# Patient Record
Sex: Female | Born: 1955 | Race: White | Hispanic: No | Smoking: Current every day smoker
Health system: Southern US, Community
[De-identification: ages and names within clinical notes are randomized; demographics above are authoritative.]

## PROBLEM LIST (undated history)

## (undated) DIAGNOSIS — F32A Depression, unspecified: Secondary | ICD-10-CM

## (undated) DIAGNOSIS — I2510 Atherosclerotic heart disease of native coronary artery without angina pectoris: Secondary | ICD-10-CM

## (undated) DIAGNOSIS — F1910 Other psychoactive substance abuse, uncomplicated: Secondary | ICD-10-CM

## (undated) DIAGNOSIS — J849 Interstitial pulmonary disease, unspecified: Secondary | ICD-10-CM

## (undated) DIAGNOSIS — J449 Chronic obstructive pulmonary disease, unspecified: Secondary | ICD-10-CM

## (undated) DIAGNOSIS — I10 Essential (primary) hypertension: Secondary | ICD-10-CM

## (undated) DIAGNOSIS — N183 Chronic kidney disease, stage 3 unspecified: Secondary | ICD-10-CM

## (undated) DIAGNOSIS — E119 Type 2 diabetes mellitus without complications: Secondary | ICD-10-CM

## (undated) DIAGNOSIS — Z720 Tobacco use: Secondary | ICD-10-CM

## (undated) DIAGNOSIS — I739 Peripheral vascular disease, unspecified: Secondary | ICD-10-CM

## (undated) DIAGNOSIS — D649 Anemia, unspecified: Secondary | ICD-10-CM

## (undated) DIAGNOSIS — Z5189 Encounter for other specified aftercare: Secondary | ICD-10-CM

## (undated) DIAGNOSIS — J439 Emphysema, unspecified: Secondary | ICD-10-CM

## (undated) DIAGNOSIS — E039 Hypothyroidism, unspecified: Secondary | ICD-10-CM

## (undated) DIAGNOSIS — I509 Heart failure, unspecified: Secondary | ICD-10-CM

## (undated) DIAGNOSIS — E785 Hyperlipidemia, unspecified: Secondary | ICD-10-CM

## (undated) DIAGNOSIS — T7840XA Allergy, unspecified, initial encounter: Secondary | ICD-10-CM

## (undated) DIAGNOSIS — F419 Anxiety disorder, unspecified: Secondary | ICD-10-CM

## (undated) DIAGNOSIS — L409 Psoriasis, unspecified: Secondary | ICD-10-CM

## (undated) DIAGNOSIS — I5032 Chronic diastolic (congestive) heart failure: Secondary | ICD-10-CM

## (undated) HISTORY — DX: Psoriasis, unspecified: L40.9

## (undated) HISTORY — PX: COLONOSCOPY: SHX174

## (undated) HISTORY — PX: ABDOMINAL HYSTERECTOMY: SHX81

## (undated) HISTORY — DX: Hypothyroidism, unspecified: E03.9

## (undated) HISTORY — PX: OTHER SURGICAL HISTORY: SHX169

## (undated) HISTORY — DX: Tobacco use: Z72.0

## (undated) HISTORY — PX: DILATION AND CURETTAGE OF UTERUS: SHX78

## (undated) HISTORY — DX: Hyperlipidemia, unspecified: E78.5

## (undated) HISTORY — PX: KNEE SURGERY: SHX244

## (undated) HISTORY — DX: Emphysema, unspecified: J43.9

## (undated) HISTORY — DX: Essential (primary) hypertension: I10

## (undated) HISTORY — DX: Anemia, unspecified: D64.9

## (undated) HISTORY — PX: TUBAL LIGATION: SHX77

## (undated) HISTORY — PX: APPENDECTOMY: SHX54

---

## 1992-04-21 HISTORY — PX: OOPHORECTOMY: SHX86

## 1999-10-10 ENCOUNTER — Ambulatory Visit (HOSPITAL_BASED_OUTPATIENT_CLINIC_OR_DEPARTMENT_OTHER): Admission: RE | Admit: 1999-10-10 | Discharge: 1999-10-10 | Payer: Self-pay | Admitting: Orthopaedic Surgery

## 2004-02-01 ENCOUNTER — Encounter: Admission: RE | Admit: 2004-02-01 | Discharge: 2004-02-01 | Payer: Self-pay | Admitting: Occupational Medicine

## 2007-09-14 ENCOUNTER — Ambulatory Visit: Payer: Self-pay | Admitting: Internal Medicine

## 2007-11-15 ENCOUNTER — Ambulatory Visit: Payer: Self-pay | Admitting: Internal Medicine

## 2007-11-17 ENCOUNTER — Ambulatory Visit: Payer: Self-pay

## 2007-12-10 ENCOUNTER — Ambulatory Visit: Payer: Self-pay | Admitting: Obstetrics and Gynecology

## 2007-12-17 ENCOUNTER — Ambulatory Visit: Payer: Self-pay | Admitting: Obstetrics and Gynecology

## 2008-05-30 ENCOUNTER — Ambulatory Visit: Payer: Self-pay | Admitting: Internal Medicine

## 2009-10-09 ENCOUNTER — Observation Stay: Payer: Self-pay | Admitting: Internal Medicine

## 2009-10-16 ENCOUNTER — Ambulatory Visit: Payer: Self-pay | Admitting: Internal Medicine

## 2010-11-22 ENCOUNTER — Emergency Department: Payer: Self-pay | Admitting: Emergency Medicine

## 2011-03-25 ENCOUNTER — Ambulatory Visit: Payer: Self-pay | Admitting: Internal Medicine

## 2012-03-30 ENCOUNTER — Emergency Department: Payer: Self-pay | Admitting: Emergency Medicine

## 2012-03-30 LAB — BASIC METABOLIC PANEL
Anion Gap: 6 — ABNORMAL LOW (ref 7–16)
BUN: 8 mg/dL (ref 7–18)
Calcium, Total: 9 mg/dL (ref 8.5–10.1)
Chloride: 107 mmol/L (ref 98–107)
Co2: 26 mmol/L (ref 21–32)
Creatinine: 0.62 mg/dL (ref 0.60–1.30)
EGFR (African American): >60
EGFR (Non-African Amer.): >60
Glucose: 94 mg/dL (ref 65–99)
Osmolality: 276 (ref 275–301)
Potassium: 3.8 mmol/L (ref 3.5–5.1)
Sodium: 139 mmol/L (ref 136–145)

## 2012-03-30 LAB — TROPONIN I
Troponin-I: <0.02 ng/mL
Troponin-I: <0.02 ng/mL

## 2012-03-30 LAB — CBC
HCT: 48 % — ABNORMAL HIGH (ref 35.0–47.0)
HGB: 16.5 g/dL — ABNORMAL HIGH (ref 12.0–16.0)
MCH: 31.6 pg (ref 26.0–34.0)
MCHC: 34.3 g/dL (ref 32.0–36.0)
MCV: 92 fL (ref 80–100)
Platelet: 228 10*3/uL (ref 150–440)
RBC: 5.22 10*6/uL — ABNORMAL HIGH (ref 3.80–5.20)
RDW: 14.2 % (ref 11.5–14.5)
WBC: 9.1 10*3/uL (ref 3.6–11.0)

## 2012-04-02 ENCOUNTER — Encounter: Payer: Self-pay | Admitting: *Deleted

## 2012-04-06 ENCOUNTER — Ambulatory Visit: Payer: Self-pay | Admitting: Cardiovascular Disease

## 2012-05-27 ENCOUNTER — Ambulatory Visit: Payer: Self-pay | Admitting: Internal Medicine

## 2013-01-10 ENCOUNTER — Ambulatory Visit: Payer: Self-pay | Admitting: Gastroenterology

## 2013-01-11 LAB — PATHOLOGY REPORT

## 2013-06-02 ENCOUNTER — Ambulatory Visit: Payer: Self-pay | Admitting: Internal Medicine

## 2013-08-09 ENCOUNTER — Ambulatory Visit: Payer: Self-pay | Admitting: Internal Medicine

## 2013-08-19 DIAGNOSIS — L409 Psoriasis, unspecified: Secondary | ICD-10-CM | POA: Insufficient documentation

## 2013-08-23 ENCOUNTER — Ambulatory Visit: Payer: Self-pay | Admitting: Internal Medicine

## 2013-09-06 ENCOUNTER — Ambulatory Visit: Payer: Self-pay | Admitting: Gynecologic Oncology

## 2013-09-07 LAB — CA 125: CA 125: 9.5 U/mL (ref 0.0–34.0)

## 2013-09-19 ENCOUNTER — Ambulatory Visit: Payer: Self-pay | Admitting: Gynecologic Oncology

## 2013-09-21 ENCOUNTER — Ambulatory Visit: Payer: Self-pay | Admitting: Gynecologic Oncology

## 2013-09-21 LAB — CBC
HCT: 47.4 % — ABNORMAL HIGH (ref 35.0–47.0)
HGB: 16 g/dL (ref 12.0–16.0)
MCH: 30.8 pg (ref 26.0–34.0)
MCHC: 33.7 g/dL (ref 32.0–36.0)
MCV: 91 fL (ref 80–100)
Platelet: 220 10*3/uL (ref 150–440)
RBC: 5.2 10*6/uL (ref 3.80–5.20)
RDW: 14.7 % — ABNORMAL HIGH (ref 11.5–14.5)
WBC: 9.8 10*3/uL (ref 3.6–11.0)

## 2013-09-21 LAB — BASIC METABOLIC PANEL
Anion Gap: 4 — ABNORMAL LOW (ref 7–16)
BUN: 7 mg/dL (ref 7–18)
Calcium, Total: 8.9 mg/dL (ref 8.5–10.1)
Chloride: 105 mmol/L (ref 98–107)
Co2: 31 mmol/L (ref 21–32)
Creatinine: 0.5 mg/dL — ABNORMAL LOW (ref 0.60–1.30)
EGFR (African American): >60
EGFR (Non-African Amer.): >60
Glucose: 88 mg/dL (ref 65–99)
Osmolality: 277 (ref 275–301)
Potassium: 3.5 mmol/L (ref 3.5–5.1)
Sodium: 140 mmol/L (ref 136–145)

## 2013-09-27 ENCOUNTER — Ambulatory Visit: Payer: Self-pay | Admitting: Obstetrics and Gynecology

## 2013-09-28 LAB — BASIC METABOLIC PANEL
Anion Gap: 4 — ABNORMAL LOW (ref 7–16)
BUN: 15 mg/dL (ref 7–18)
Calcium, Total: 8.7 mg/dL (ref 8.5–10.1)
Chloride: 101 mmol/L (ref 98–107)
Co2: 28 mmol/L (ref 21–32)
Creatinine: 0.61 mg/dL (ref 0.60–1.30)
EGFR (African American): >60
EGFR (Non-African Amer.): >60
Glucose: 117 mg/dL — ABNORMAL HIGH (ref 65–99)
Osmolality: 268 (ref 275–301)
Potassium: 3.9 mmol/L (ref 3.5–5.1)
Sodium: 133 mmol/L — ABNORMAL LOW (ref 136–145)

## 2013-09-28 LAB — HEMATOCRIT: HCT: 44.7 % (ref 35.0–47.0)

## 2013-09-30 LAB — PATHOLOGY REPORT

## 2013-10-19 ENCOUNTER — Ambulatory Visit: Payer: Self-pay | Admitting: Gynecologic Oncology

## 2014-08-12 NOTE — Op Note (Signed)
PATIENT NAME:  Misty Adams, Misty Adams MR#:  [id number] DATE OF BIRTH:  11-30-1955  DATE OF PROCEDURE:  09/27/2013  PREOPERATIVE DIAGNOSIS: Pelvic mass.   POSTOPERATIVE DIAGNOSIS: Intraligamentous uterine fibroid.    PROCEDURE PERFORMED: Laparoscopy with robotic assistance for hysterectomy and left salpingo-oophorectomy.   SURGEON: Jacquelyne Balint, M.D.   ANESTHESIA: General.   ESTIMATED BLOOD LOSS: 25 mL or less.   COMPLICATIONS: None.   INDICATION FOR SURGERY: Mrs. Magnussen is a 59 year old patient, who was diagnosed with a persistent 5 cm slightly complex pelvic mass and therefore the decision was made to proceed with surgery.   FINDINGS AT TIME OF SURGERY: Uterus of normal form and size, in mid position. The left adnexa unremarkable. Right adnexa missing. However, a 5 cm intraligamentous fibroid was noted on the right side. There were a few pelvic adhesions.   OPERATIVE REPORT: After adequate general anesthesia had been obtained, the patient was prepped and draped in ski position. A Foley catheter was inserted. A V-Care was inserted into the uterus and around the cervix. Attention was then directed towards the abdomen. A 1 cm incision was placed above the umbilicus. The fascia was transected. The peritoneum was identified and entered followed by the blunt trocar. Inspection was done with the above-mentioned findings. Under direct vision, two robotic trocars were inserted into the mid abdomen. A fourth one into the left lower quadrant and a Versa Step assistant port into the right lower quadrant. The patient was placed in extreme Trendelenburg position and attached to the robot. Inspection was done with the above-mentioned findings. Pelvic cytology was obtained. Attention was first directed towards the left pelvic sidewall. The round ligament was cauterized and transected. The peritoneum was entered. Vessels and ureter were identified. The infundibulopelvic ligament was cauterized and transected  and the adnexa were mobilized all the way towards the uterus. Then, the anterior fold of the peritoneum was transected. The bladder was freed from the lower uterine segment and cervix.   Attention was then directed towards the right side again. The round ligament was cauterized and transected. The adnexa were absent here. The pelvic sidewall was entered. The stump of the infundibulopelvic ligament was again cauterized and transected and mobilized towards the uterus. The ureter was traced all the way down to the bladder. The bladder was freed from the lower uterine segment. Then by blunt and sharp dissection, the intraligamentous fibroid was freed completely down to its attachment to the uterus. The ureter was kept under constant visualization. Then, the posterior peritoneum was incised and dissection of the bladder was continued. Finally, the uterine vessels could be cauterized and transected. The vagina was entered posteriorly and the incision was carried all the way around so that uterus, fibroid and left adnexa could be removed completely through the vagina. The vaginal apex was closed with using a figure-of-eight stitch with 0 Vicryl on the left side and a running V-Loc suture starting on the right and ending on the left side.  Careful inspection  of the pelvis revealed adequate hemostasis. Then, the patient was detached from the robot. All ports were removed under direct vision without evidence of bleeding. The camera port site was closed with a figure-of-eight stitch using 0 Vicryl for the fascia. All of the subcutaneous tissues and all other port sites was reapproximated with 3-0 Vicryl and the skin was closed with Dermabond. The patient tolerated the procedure well and was taken to the recovery room in stable condition. Postoperative urine was clear. Pad, sponge, needle, and instrument  counts were correct x 2.   ____________________________ Weber Cooks, MD bem:aw D: 10/05/2013 09:21:32  ET T: 10/05/2013 09:35:32 ET JOB#: [id number]  cc: Weber Cooks, MD, <Dictator> Weber Cooks MD ELECTRONICALLY SIGNED 10/11/2013 10:47

## 2015-06-14 ENCOUNTER — Other Ambulatory Visit: Payer: Self-pay | Admitting: Internal Medicine

## 2015-06-14 DIAGNOSIS — Z1231 Encounter for screening mammogram for malignant neoplasm of breast: Secondary | ICD-10-CM

## 2015-06-28 ENCOUNTER — Ambulatory Visit: Admission: RE | Admit: 2015-06-28 | Payer: Self-pay | Source: Ambulatory Visit

## 2017-07-08 ENCOUNTER — Other Ambulatory Visit: Payer: Self-pay | Admitting: Gastroenterology

## 2017-07-08 DIAGNOSIS — K625 Hemorrhage of anus and rectum: Secondary | ICD-10-CM

## 2017-07-09 ENCOUNTER — Ambulatory Visit
Admission: RE | Admit: 2017-07-09 | Discharge: 2017-07-09 | Disposition: A | Discharge status: Home / Self Care | Payer: BLUE CROSS/BLUE SHIELD | Source: Ambulatory Visit | Attending: Gastroenterology | Admitting: Gastroenterology

## 2017-07-09 DIAGNOSIS — K5730 Diverticulosis of large intestine without perforation or abscess without bleeding: Secondary | ICD-10-CM | POA: Diagnosis not present

## 2017-07-09 DIAGNOSIS — K760 Fatty (change of) liver, not elsewhere classified: Secondary | ICD-10-CM | POA: Insufficient documentation

## 2017-07-09 DIAGNOSIS — R1031 Right lower quadrant pain: Secondary | ICD-10-CM | POA: Insufficient documentation

## 2017-07-09 DIAGNOSIS — R160 Hepatomegaly, not elsewhere classified: Secondary | ICD-10-CM | POA: Insufficient documentation

## 2017-07-09 DIAGNOSIS — J439 Emphysema, unspecified: Secondary | ICD-10-CM | POA: Diagnosis not present

## 2017-07-09 DIAGNOSIS — I2510 Atherosclerotic heart disease of native coronary artery without angina pectoris: Secondary | ICD-10-CM | POA: Diagnosis not present

## 2017-07-09 DIAGNOSIS — K625 Hemorrhage of anus and rectum: Secondary | ICD-10-CM | POA: Insufficient documentation

## 2017-07-09 DIAGNOSIS — R1032 Left lower quadrant pain: Secondary | ICD-10-CM | POA: Diagnosis not present

## 2017-07-09 LAB — POCT I-STAT CREATININE: Creatinine, Ser: 0.7 mg/dL (ref 0.44–1.00)

## 2017-07-09 MED ORDER — IOPAMIDOL (ISOVUE-300) INJECTION 61%
100.0000 mL | Freq: Once | INTRAVENOUS | Status: AC | PRN
  Administered 2017-07-09: 100 mL via INTRAVENOUS

## 2017-07-13 ENCOUNTER — Encounter: Payer: Self-pay | Admitting: *Deleted

## 2017-07-13 ENCOUNTER — Emergency Department: Payer: BLUE CROSS/BLUE SHIELD

## 2017-07-13 ENCOUNTER — Emergency Department
Admission: EM | Admit: 2017-07-13 | Discharge: 2017-07-14 | Disposition: A | Discharge status: Home / Self Care | Payer: BLUE CROSS/BLUE SHIELD | Attending: Emergency Medicine | Admitting: Emergency Medicine

## 2017-07-13 DIAGNOSIS — I10 Essential (primary) hypertension: Secondary | ICD-10-CM | POA: Insufficient documentation

## 2017-07-13 DIAGNOSIS — I959 Hypotension, unspecified: Secondary | ICD-10-CM | POA: Diagnosis present

## 2017-07-13 DIAGNOSIS — E876 Hypokalemia: Secondary | ICD-10-CM

## 2017-07-13 DIAGNOSIS — Z79899 Other long term (current) drug therapy: Secondary | ICD-10-CM | POA: Insufficient documentation

## 2017-07-13 DIAGNOSIS — E039 Hypothyroidism, unspecified: Secondary | ICD-10-CM | POA: Diagnosis not present

## 2017-07-13 DIAGNOSIS — E119 Type 2 diabetes mellitus without complications: Secondary | ICD-10-CM | POA: Diagnosis not present

## 2017-07-13 DIAGNOSIS — Z7982 Long term (current) use of aspirin: Secondary | ICD-10-CM | POA: Insufficient documentation

## 2017-07-13 DIAGNOSIS — I771 Stricture of artery: Secondary | ICD-10-CM

## 2017-07-13 DIAGNOSIS — F17210 Nicotine dependence, cigarettes, uncomplicated: Secondary | ICD-10-CM | POA: Insufficient documentation

## 2017-07-13 HISTORY — DX: Type 2 diabetes mellitus without complications: E11.9

## 2017-07-13 LAB — COMPREHENSIVE METABOLIC PANEL
ALBUMIN: 4.4 g/dL (ref 3.5–5.0)
ALT: 17 U/L (ref 14–54)
AST: 20 U/L (ref 15–41)
Alkaline Phosphatase: 62 U/L (ref 38–126)
Anion gap: 15 (ref 5–15)
BUN: 23 mg/dL — AB (ref 6–20)
CHLORIDE: 90 mmol/L — AB (ref 101–111)
CO2: 27 mmol/L (ref 22–32)
CREATININE: 1.01 mg/dL — AB (ref 0.44–1.00)
Calcium: 9.9 mg/dL (ref 8.9–10.3)
GFR calc Af Amer: >60 mL/min (ref >60)
GFR calc non Af Amer: 58 mL/min — ABNORMAL LOW (ref >60)
GLUCOSE: 168 mg/dL — AB (ref 65–99)
POTASSIUM: 2.6 mmol/L — AB (ref 3.5–5.1)
Sodium: 132 mmol/L — ABNORMAL LOW (ref 135–145)
Total Bilirubin: 0.7 mg/dL (ref 0.3–1.2)
Total Protein: 8.5 g/dL — ABNORMAL HIGH (ref 6.5–8.1)

## 2017-07-13 LAB — URINALYSIS, COMPLETE (UACMP) WITH MICROSCOPIC
Bilirubin Urine: NEGATIVE
Glucose, UA: NEGATIVE mg/dL
Hgb urine dipstick: NEGATIVE
Ketones, ur: NEGATIVE mg/dL
Leukocytes, UA: NEGATIVE
Nitrite: NEGATIVE
PROTEIN: NEGATIVE mg/dL
SPECIFIC GRAVITY, URINE: 1.018 (ref 1.005–1.030)
pH: 5 (ref 5.0–8.0)

## 2017-07-13 LAB — CBC
HEMATOCRIT: 50.8 % — AB (ref 35.0–47.0)
Hemoglobin: 17.2 g/dL — ABNORMAL HIGH (ref 12.0–16.0)
MCH: 29.4 pg (ref 26.0–34.0)
MCHC: 33.9 g/dL (ref 32.0–36.0)
MCV: 86.8 fL (ref 80.0–100.0)
PLATELETS: 266 10*3/uL (ref 150–440)
RBC: 5.85 MIL/uL — ABNORMAL HIGH (ref 3.80–5.20)
RDW: 15.4 % — AB (ref 11.5–14.5)
WBC: 9.9 10*3/uL (ref 3.6–11.0)

## 2017-07-13 LAB — LIPASE, BLOOD: LIPASE: 41 U/L (ref 11–51)

## 2017-07-13 MED ORDER — IOPAMIDOL (ISOVUE-370) INJECTION 76%
100.0000 mL | Freq: Once | INTRAVENOUS | Status: AC | PRN
  Administered 2017-07-13: 100 mL via INTRAVENOUS

## 2017-07-13 MED ORDER — MAGNESIUM SULFATE 2 GM/50ML IV SOLN
2.0000 g | Freq: Once | INTRAVENOUS | Status: AC
Start: 2017-07-13 — End: 2017-07-14
  Administered 2017-07-14: 2 g via INTRAVENOUS
  Filled 2017-07-13: qty 50

## 2017-07-13 MED ORDER — ONDANSETRON HCL 4 MG/2ML IJ SOLN
INTRAMUSCULAR | Status: AC
  Administered 2017-07-13: 4 mg via INTRAVENOUS
  Filled 2017-07-13: qty 2

## 2017-07-13 MED ORDER — POTASSIUM CHLORIDE 20 MEQ PO PACK
40.0000 meq | PACK | Freq: Once | ORAL | Status: AC
  Administered 2017-07-13: 40 meq via ORAL
  Filled 2017-07-13: qty 2

## 2017-07-13 MED ORDER — POTASSIUM CHLORIDE CRYS ER 20 MEQ PO TBCR
80.0000 meq | EXTENDED_RELEASE_TABLET | Freq: Once | ORAL | Status: AC
  Administered 2017-07-14: 80 meq via ORAL
  Filled 2017-07-13: qty 4

## 2017-07-13 MED ORDER — ONDANSETRON HCL 4 MG/2ML IJ SOLN
4.0000 mg | Freq: Once | INTRAMUSCULAR | Status: AC
  Administered 2017-07-13: 4 mg via INTRAVENOUS

## 2017-07-13 NOTE — ED Notes (Signed)
Unable to obtain BP on automatic monitor, manual BP 96/68 on left extremity. No swelling, redness, cap refill 3 seconds, hematoma to left AC from previous blood draw, able to doppler brachial and radial pulse.

## 2017-07-13 NOTE — ED Notes (Signed)
Dr. Rifenbark at bedside 

## 2017-07-13 NOTE — ED Notes (Signed)
Pt taken to CT via stretcher.

## 2017-07-13 NOTE — ED Notes (Signed)
Date and time results received: 07/13/17 2235   Test: potassium Critical Value: 2.6  Name of Provider Notified: Dr. Cinda Quest

## 2017-07-13 NOTE — ED Provider Notes (Signed)
[facility] Emergency Department Provider Note  ____________________________________________   First MD Initiated Contact with Patient 07/13/17 2247     (approximate)  I have reviewed the triage vital signs and the nursing notes.   HISTORY  Chief Complaint Hypotension   HPI Misty Adams is a 62 y.o. female who self presents to the emergency department with difficulty taking a blood pressure in her left upper extremity for the past 5 days or so.  She has had no chest pain, shortness of breath, numbness, or weakness.  She actually initially had some rectal bleeding and was evaluated by her primary care physician who referred her to gastroenterology who obtained a CT scan of her abdomen pelvis 6 days ago.  The CT did not show any diverticulitis however during that workup she was diagnosed with diabetes and initiated on metformin 500 mg twice a day.  She said that ever since beginning the metformin she has felt "not quite right" and has had difficulty checking her blood pressure in her left upper extremity.  She only ever got one blood pressure reading of 90/50 in the past 5 days.  The blood pressure monitor at home is able to get a strong blood pressure in her right upper extremity.  Today she called her son who is a paramedic to come over and confirmed the blood pressure and I think he did not palpate a radial pulse he advised her to come to the emergency department.  Her symptoms are moderate in severity.  They seemed to come on suddenly.  They are constant.  Past Medical History:  Diagnosis Date  . Diabetes mellitus without complication ([place])   . Hyperlipidemia   . Hypertension   . Hypothyroidism   . Psoriasis   . Tobacco abuse     There are no active problems to display for this patient.   Past Surgical History:  Procedure Laterality Date  . ABDOMINAL HYSTERECTOMY    . APPENDECTOMY    . CESAREAN SECTION    . dilatation and curettage    . KNEE  SURGERY    . left wrist ganglion cyst removal      Prior to Admission medications   Medication Sig Start Date End Date Taking? Authorizing Provider  albuterol (PROVENTIL HFA;VENTOLIN HFA) 108 (90 Base) MCG/ACT inhaler Inhale 2 puffs into the lungs every 6 (six) hours as needed for wheezing or shortness of breath. 07/14/17   Darel Hong, MD  aspirin 81 MG tablet Take 81 mg by mouth daily.    [provider]  atorvastatin (LIPITOR) 40 MG tablet Take 1 tablet (40 mg total) by mouth at bedtime. 07/14/17 07/14/18  Darel Hong, MD  clopidogrel (PLAVIX) 75 MG tablet Take 1 tablet (75 mg total) by mouth daily. 07/14/17 07/14/18  Darel Hong, MD  hydrochlorothiazide (HYDRODIURIL) 25 MG tablet Take 25 mg by mouth daily.    [provider]  levothyroxine (SYNTHROID, LEVOTHROID) 100 MCG tablet Take 100 mcg by mouth daily.    [provider]    Allergies Vicodin [hydrocodone-acetaminophen]  Family History  Problem Relation Age of Onset  . Heart attack Father     Social History Social History   Tobacco Use  . Smoking status: Current Every Day Smoker    Packs/day: 2.00    Types: Cigarettes  . Smokeless tobacco: Never Used  Substance Use Topics  . Alcohol use: Yes  . Drug use: Not on file    Review of Systems Constitutional: No fever/chills  Eyes: No visual changes. ENT: No sore throat. Cardiovascular: Denies chest pain. Respiratory: Denies shortness of breath. Gastrointestinal: No abdominal pain.  No nausea, no vomiting.  No diarrhea.  No constipation. Genitourinary: Negative for dysuria. Musculoskeletal: Negative for back pain. Skin: Negative for rash. Neurological: Negative for headaches, focal weakness or numbness.   ____________________________________________   PHYSICAL EXAM:  VITAL SIGNS: ED Triage Vitals  Enc Vitals Group     BP 07/13/17 2145 (!) 163/87     Pulse Rate 07/13/17 2145 91     Resp 07/13/17 2145 18     Temp 07/13/17 2145  99.3 F (37.4 C)     Temp Source 07/13/17 2145 Oral     SpO2 07/13/17 2145 98 %     Weight 07/13/17 2145 205 lb (93 kg)     Height 07/13/17 2145 5\' 3"  (1.6 m)     Head Circumference --      Peak Flow --      Pain Score 07/13/17 2140 2     Pain Loc --      Pain Edu? --      Excl. in [place]? --     Constitutional: Alert and oriented x4 pleasant cooperative speaks in full clear sentences no diaphoresis Eyes: PERRL EOMI. Head: Atraumatic. Nose: No congestion/rhinnorhea. Mouth/Throat: No trismus Neck: No stridor.   Cardiovascular: Normal rate, regular rhythm. Grossly normal heart sounds.  Good peripheral circulation. Respiratory: Normal respiratory effort.  No retractions. Lungs CTAB and moving good air Gastrointestinal: Soft nontender Musculoskeletal: No palpable pulse in left radial although biphasic dopplerable with less than 3-second capillary refill and neurovascularly intact Neurologic:  Normal speech and language. No gross focal neurologic deficits are appreciated. Skin:  Skin is warm, dry and intact. No rash noted. Psychiatric: Mood and affect are normal. Speech and behavior are normal.    ____________________________________________   DIFFERENTIAL includes but not limited to  Aortic dissection, aortic stenosis, subclavian stenosis, subclavian thrombus ____________________________________________   LABS (all labs ordered are listed, but only abnormal results are displayed)  Labs Reviewed  COMPREHENSIVE METABOLIC PANEL - Abnormal; Notable for the following components:      Result Value   Sodium 132 (*)    Potassium 2.6 (*)    Chloride 90 (*)    Glucose, Bld 168 (*)    BUN 23 (*)    Creatinine, Ser 1.01 (*)    Total Protein 8.5 (*)    GFR calc non Af Amer 58 (*)    All other components within normal limits  CBC - Abnormal; Notable for the following components:   RBC 5.85 (*)    Hemoglobin 17.2 (*)    HCT 50.8 (*)    RDW 15.4 (*)    All other components within  normal limits  URINALYSIS, COMPLETE (UACMP) WITH MICROSCOPIC - Abnormal; Notable for the following components:   Color, Urine YELLOW (*)    APPearance HAZY (*)    Bacteria, UA RARE (*)    Squamous Epithelial / LPF 6-30 (*)    All other components within normal limits  LIPASE, BLOOD    Lab work reviewed by me with hemoconcentration consistent with dehydration.  Slightly low potassium is nonspecific __________________________________________  EKG  ED ECG REPORT I, Darel Hong, the attending physician, personally viewed and interpreted this ECG.  Date: 07/13/2017 EKG Time:  Rate: 94 Rhythm: normal sinus rhythm QRS Axis: normal Intervals: Prolonged QTC ST/T Wave abnormalities: normal Narrative Interpretation: no evidence of acute ischemia _________________________________________  RADIOLOGY  CT angiogram of the chest reviewed by me with left subclavian stenosis ____________________________________________   PROCEDURES  Procedure(s) performed: no  .Critical Care Performed by: Darel Hong, MD Authorized by: Darel Hong, MD   Critical care provider statement:    Critical care time (minutes):  35   Critical care time was exclusive of:  Separately billable procedures and treating other patients   Critical care was necessary to treat or prevent imminent or life-threatening deterioration of the following conditions:  Circulatory failure   Critical care was time spent personally by me on the following activities:  Development of treatment plan with patient or surrogate, discussions with consultants, evaluation of patient's response to treatment, examination of patient, obtaining history from patient or surrogate, ordering and performing treatments and interventions, ordering and review of laboratory studies, ordering and review of radiographic studies, pulse oximetry, re-evaluation of patient's condition and review of old charts    Critical Care performed:  Yes  Observation: no ____________________________________________   INITIAL IMPRESSION / ASSESSMENT AND PLAN / ED COURSE  Pertinent labs & imaging results that were available during my care of the patient were reviewed by me and considered in my medical decision making (see chart for details).  The patient appears to have 2 issues going on.  First she is hypoxic to 87% on room air with relatively clear lungs although when taking deep breaths she comes up into the mid 90s.  She carries no diagnosis of COPD although is a heavy smoker.  More concerning obviously is the decreased pulse and difficulty obtaining blood pressure in the left upper extremity.  Dissection would be unusual without any pain component, however I am concerned about arterial occlusion versus dissection etc.  CT angiogram is pending.     ----------------------------------------- 12:03 AM on 07/14/2017 -----------------------------------------  I was called by radiology that the patient has an approximately 90% stenosis of her left subclavian artery.  Went in and reevaluated the patient and confirmed that she has a biphasic radial pulse.  Have a call out to vascular surgery now.  Regarding her hypoxia will give her a treatment with one DuoNeb and reevaluate. ____________________________________________  ----------------------------------------- 12:08 AM on 07/14/2017 -----------------------------------------  I spoke with Dr. Lucky Cowboy of vascular surgery who recommended dual antiplatelet therapy along with a statin and he would be happy to see the patient in clinic this week.   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  I explained the rationale for dual antiplatelet therapy and we discussed the risks of bleeding particularly exacerbated by  the patient's recent GI bleed.  As she is somewhat symptomatic and lack of anti-coagulation could lead to loss of her left upper extremity I do believe it is safer to anticoagulate now.  She is discharged home in improved condition verbalizes understanding and agreement the plan.  FINAL CLINICAL IMPRESSION(S) / ED DIAGNOSES  Final diagnoses:  Hypokalemia  Subclavian arterial stenosis ([facility])      NEW MEDICATIONS STARTED DURING THIS VISIT:  Discharge Medication List as of 07/14/2017 12:14 AM    START taking these medications   Details  albuterol (PROVENTIL HFA;VENTOLIN HFA) 108 (90 Base) MCG/ACT inhaler Inhale 2 puffs into the lungs every 6 (six) hours as needed for wheezing or shortness of breath., Starting Tue 07/14/2017, Print    atorvastatin (LIPITOR) 40 MG tablet Take 1 tablet (40 mg total) by mouth at  bedtime., Starting Tue 07/14/2017, Until Wed 07/14/2018, Print    clopidogrel (PLAVIX) 75 MG tablet Take 1 tablet (75 mg total) by mouth daily., Starting Tue 07/14/2017, Until Wed 07/14/2018, Print         Note:  This document was prepared using Dragon voice recognition software and may include unintentional dictation errors.     Darel Hong, MD 07/15/17 [phone]

## 2017-07-13 NOTE — ED Notes (Signed)
Pt placed on 2 L nasal cannula d/t oxygen at 87%. Will continue to monitor.

## 2017-07-13 NOTE — ED Notes (Signed)
Pt states that since last Wednesday hasn't been able to get a blood pressure on L arm. States was at doctors office recently and wasn't able to get a BP initially but then they get a reading of "140/something" per pt. Pt denies any swelling or noticing L arm/fingers turning blue. Pt is able to move L arm. Alert, oriented, ambulatory. Denies dizziness, weakness, or pain.

## 2017-07-13 NOTE — ED Notes (Signed)
X-ray at bedside

## 2017-07-13 NOTE — ED Triage Notes (Signed)
Pt says that she has had difficulty today obtaining a bp in the left arm, she was finally able to get one in the left arm reading in the 90's, and difficulty finding a pulse. The patient says that she was seen last week by her doctor and she was getting lower blood pressures in the left arm. Onset of vomiting 2 hours ago. Denies pain or swelling in the left arm. Dopplar radial and brachial pulse, without swelling or pain. Hematoma to the left [facility] where labs were obtained by her provider.

## 2017-07-14 MED ORDER — ASPIRIN 81 MG PO CHEW
81.0000 mg | CHEWABLE_TABLET | Freq: Once | ORAL | Status: AC
  Administered 2017-07-14: 81 mg via ORAL
  Filled 2017-07-14: qty 1

## 2017-07-14 MED ORDER — ATORVASTATIN CALCIUM 40 MG PO TABS: 40.0000 mg | ORAL_TABLET | Freq: Every day | ORAL | 0 refills | Status: AC

## 2017-07-14 MED ORDER — ALBUTEROL SULFATE HFA 108 (90 BASE) MCG/ACT IN AERS: 2.0000 | INHALATION_SPRAY | Freq: Four times a day (QID) | RESPIRATORY_TRACT | 0 refills | Status: DC | PRN

## 2017-07-14 MED ORDER — CLOPIDOGREL BISULFATE 75 MG PO TABS
75.0000 mg | ORAL_TABLET | Freq: Once | ORAL | Status: AC
  Administered 2017-07-14: 75 mg via ORAL
  Filled 2017-07-14: qty 1

## 2017-07-14 MED ORDER — CLOPIDOGREL BISULFATE 75 MG PO TABS: 75.0000 mg | ORAL_TABLET | Freq: Every day | ORAL | 0 refills | Status: AC

## 2017-07-14 MED ORDER — IPRATROPIUM-ALBUTEROL 0.5-2.5 (3) MG/3ML IN SOLN
3.0000 mL | Freq: Once | RESPIRATORY_TRACT | Status: AC
  Administered 2017-07-14: 3 mL via RESPIRATORY_TRACT
  Filled 2017-07-14: qty 3

## 2017-07-14 NOTE — Discharge Instructions (Signed)
Please begin taking a baby aspirin as well as her Plavix every single day and begin taking your cholesterol medication every night.  Follow-up with the vascular surgeon this week for reevaluation and return to the emergency department for any concerns whatsoever.  It was a pleasure to take care of you today, and thank you for coming to our emergency department.  If you have any questions or concerns before leaving please ask the nurse to grab me and I'm more than happy to go through your aftercare instructions again.  If you were prescribed any opioid pain medication today such as Norco, Vicodin, Percocet, morphine, hydrocodone, or oxycodone please make sure you do not drive when you are taking this medication as it can alter your ability to drive safely.  If you have any concerns once you are home that you are not improving or are in fact getting worse before you can make it to your follow-up appointment, please do not hesitate to call [phone] and come back for further evaluation.  Darel Hong, MD  Results for orders placed or performed during the hospital encounter of 07/13/17  Lipase, blood  Result Value Ref Range   Lipase 41 11 - 51 U/L  Comprehensive metabolic panel  Result Value Ref Range   Sodium 132 (L) 135 - 145 mmol/L   Potassium 2.6 (LL) 3.5 - 5.1 mmol/L   Chloride 90 (L) 101 - 111 mmol/L   CO2 27 22 - 32 mmol/L   Glucose, Bld 168 (H) 65 - 99 mg/dL   BUN 23 (H) 6 - 20 mg/dL   Creatinine, Ser 1.01 (H) 0.44 - 1.00 mg/dL   Calcium 9.9 8.9 - 10.3 mg/dL   Total Protein 8.5 (H) 6.5 - 8.1 g/dL   Albumin 4.4 3.5 - 5.0 g/dL   AST 20 15 - 41 U/L   ALT 17 14 - 54 U/L   Alkaline Phosphatase 62 38 - 126 U/L   Total Bilirubin 0.7 0.3 - 1.2 mg/dL   GFR calc non Af Amer 58 (L) >60 mL/min   GFR calc Af Amer >60 >60 mL/min   Anion gap 15 5 - 15  CBC  Result Value Ref Range   WBC 9.9 3.6 - 11.0 K/uL   RBC 5.85 (H) 3.80 - 5.20 MIL/uL   Hemoglobin 17.2 (H) 12.0 - 16.0 g/dL   HCT 50.8 (H)  35.0 - 47.0 %   MCV 86.8 80.0 - 100.0 fL   MCH 29.4 26.0 - 34.0 pg   MCHC 33.9 32.0 - 36.0 g/dL   RDW 15.4 (H) 11.5 - 14.5 %   Platelets 266 150 - 440 K/uL  Urinalysis, Complete w Microscopic  Result Value Ref Range   Color, Urine YELLOW (A) YELLOW   APPearance HAZY (A) CLEAR   Specific Gravity, Urine 1.018 1.005 - 1.030   pH 5.0 5.0 - 8.0   Glucose, UA NEGATIVE NEGATIVE mg/dL   Hgb urine dipstick NEGATIVE NEGATIVE   Bilirubin Urine NEGATIVE NEGATIVE   Ketones, ur NEGATIVE NEGATIVE mg/dL   Protein, ur NEGATIVE NEGATIVE mg/dL   Nitrite NEGATIVE NEGATIVE   Leukocytes, UA NEGATIVE NEGATIVE   RBC / HPF 0-5 0 - 5 RBC/hpf   WBC, UA 0-5 0 - 5 WBC/hpf   Bacteria, UA RARE (A) NONE SEEN   Squamous Epithelial / LPF 6-30 (A) NONE SEEN   Mucus PRESENT    Hyaline Casts, UA PRESENT    Ct Abdomen Pelvis W Contrast  Result Date: 07/09/2017 CLINICAL DATA:  Rectal bleeding and lower pelvic pain for 6 days. EXAM: CT ABDOMEN AND PELVIS WITH CONTRAST TECHNIQUE: Multidetector CT imaging of the abdomen and pelvis was performed using the standard protocol following bolus administration of intravenous contrast. CONTRAST:  100 ml ISOVUE-300 IOPAMIDOL (ISOVUE-300) INJECTION 61% COMPARISON:  CT chest, abdomen and pelvis 08/09/2013. FINDINGS: Lower chest: Emphysematous change is seen in the lung bases. Lung bases are clear. Heart size is normal. No pleural or pericardial effusion. Calcific coronary artery disease is seen. Hepatobiliary: The liver is diffusely low attenuating consistent with fatty infiltration. The liver measures 21 cm craniocaudal. No focal lesion. Gallbladder and biliary tree appear normal. Pancreas: Unremarkable. No pancreatic ductal dilatation or surrounding inflammatory changes. Spleen: Normal in size without focal abnormality. Adrenals/Urinary Tract: Left adrenal adenoma is unchanged. The right adrenal gland appears normal. Kidneys, ureters and urinary bladder appear normal. Stomach/Bowel: There  is sigmoid diverticulosis without diverticulitis. The colon otherwise appears normal. Stomach and small bowel appear normal. The patient is status post appendectomy. Vascular/Lymphatic: Aortic atherosclerosis. No enlarged abdominal or pelvic lymph nodes. Reproductive: Status post hysterectomy. No adnexal masses. Other: No ascites or hernia. Musculoskeletal: Facet degenerative change lower lumbar spine noted. No acute or focal abnormality. IMPRESSION: No acute abnormality abdomen or pelvis. No finding to explain the patient's symptoms. Sigmoid diverticulosis without diverticulitis. Emphysema. Hepatomegaly and diffuse fatty infiltration of the liver. Calcific aortic and coronary atherosclerosis. Electronically Signed   By: Inge Rise M.D.   On: 07/09/2017 11:54   Dg Chest Port 1 View  Result Date: 07/13/2017 CLINICAL DATA:  Shortness of breath EXAM: PORTABLE CHEST 1 VIEW COMPARISON:  09/21/2013 FINDINGS: Diffuse bronchitic changes with atelectasis at the bases. No consolidation or effusion. Cardiomediastinal silhouette within normal limits. No pneumothorax. IMPRESSION: Chronic bronchitic changes. Electronically Signed   By: Donavan Foil M.D.   On: 07/13/2017 23:17   Ct Angio Chest/abd/pel For Dissection W And/or W/wo  Result Date: 07/14/2017 CLINICAL DATA:  Asymmetric upper extremity pulse. EXAM: CT ANGIOGRAPHY CHEST, ABDOMEN AND PELVIS TECHNIQUE: Multidetector CT imaging through the chest, abdomen and pelvis was performed using the standard protocol during bolus administration of intravenous contrast. Multiplanar reconstructed images and MIPs were obtained and reviewed to evaluate the vascular anatomy. CONTRAST:  128mL ISOVUE-370 IOPAMIDOL (ISOVUE-370) INJECTION 76% COMPARISON:  CT abdomen pelvis 07/09/2017 CT chest, abdomen and pelvis 08/09/2013 FINDINGS: CTA CHEST FINDINGS Cardiovascular: The heart size is normal. There is nopericardial effusion. The course and caliber of the thoracic aorta are  normal. There is intermediate aortic atherosclerotic calcification. Precontrast images show no aortic intramural hematoma. There is no blood pool, dissection or penetrating ulcer demonstrated on arterial phase postcontrast imaging. Normal variant aortic arch branching pattern with the left vertebral artery arising independently from the aortic arch. There is approximately 90% stenosis of the proximal left subclavian artery. Normal opacification of the remainder of the visual lysed left subclavian artery. The central pulmonary arteries are normal. Mediastinum/Nodes: No mediastinal, hilar or axillary lymphadenopathy. The visualized thyroid and thoracic esophageal course are unremarkable. Lungs/Pleura: Perifissural nodule along the right minor fissure measures 9 mm. Musculoskeletal: No chest wall abnormality. No acute osseous findings. Review of the MIP images confirms the above findings. CTA ABDOMEN AND PELVIS FINDINGS VASCULAR Aorta: Normal caliber aorta without aneurysm, dissection, vasculitis or hemodynamically significant stenosis. There is a large amount of atherosclerotic plaque within the infrarenal abdominal aorta, causing approximately 30% luminal narrowing. Celiac: No aneurysm, dissection or hemodynamically significant stenosis. Normal branching pattern. SMA: Moderate narrowing at the origin of the SMA.  Renals: There are single renal arteries bilaterally. There is moderate-to-severe stenosis of the origin of the left renal artery. No aneurysm, dissection, or evidence of fibromuscular dysplasia. IMA: Patent. Inflow: Extensive atherosclerotic calcification of the bilateral common, internal and external iliac arteries. The arteries remain patent. Veins: Normal course and caliber of the major veins. Assessment is otherwise limited by the arterial dominant contrast phase. Review of the MIP images confirms the above findings. NON-VASCULAR Hepatobiliary: Hypoattenuation of the liver meets the threshold for hepatic  steatosis. Normal gallbladder. Pancreas: Normal contours without ductal dilatation. No peripancreatic fluid collection. Spleen: Normal arterial phase splenic enhancement pattern. Adrenals/Urinary Tract: --Adrenal glands: Left adrenal nodule measures 18 x 16 mm with intermediate attenuation. This is unchanged and previously identified as an adrenal adenoma. Right adrenal gland is normal. --Right kidney/ureter: No hydronephrosis or perinephric stranding. No nephrolithiasis. No obstructing ureteral stones. --Left kidney/ureter: No hydronephrosis or perinephric stranding. No nephrolithiasis. No obstructing ureteral stones. --Urinary bladder: Unremarkable. Stomach/Bowel: --Stomach/Duodenum: No hiatal hernia or other gastric abnormality. Normal duodenal course and caliber. --Small bowel: No dilatation or inflammation. --Colon: No focal abnormality. --Appendix: Not visualized. No right lower quadrant inflammation or free fluid. Lymphatic:  No abdominal or pelvic lymphadenopathy. Reproductive: Status post hysterectomy. No adnexal mass. Musculoskeletal. No bony spinal canal stenosis or focal osseous abnormality. Other: None. Review of the MIP images confirms the above findings. IMPRESSION: 1. 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 Distal left subclavian artery remains opacified. This is in keeping with reported left upper extremity symptoms. 2. Extensive aortic atherosclerosis (ICD10-I70.0), worst in the infrarenal abdominal aorta, where there is approximately 30% luminal narrowing due to predominantly noncalcified plaque. 3. 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 4. Hepatic steatosis. These results were called by telephone at the time of interpretation on 07/13/2017 at 11:56 pm to Dr. Darel Hong , who verbally acknowledged these results. Electronically Signed   By: Ulyses Jarred M.D.   On:  07/14/2017 00:00

## 2017-07-15 ENCOUNTER — Other Ambulatory Visit: Payer: Self-pay

## 2017-07-15 ENCOUNTER — Encounter: Payer: Self-pay | Admitting: *Deleted

## 2017-07-15 ENCOUNTER — Ambulatory Visit (HOSPITAL_COMMUNITY)
Admission: RE | Admit: 2017-07-15 | Discharge: 2017-07-15 | Disposition: A | Discharge status: Home / Self Care | Payer: BLUE CROSS/BLUE SHIELD | Source: Ambulatory Visit | Attending: Vascular Surgery | Admitting: Vascular Surgery

## 2017-07-15 ENCOUNTER — Encounter: Payer: Self-pay | Admitting: Vascular Surgery

## 2017-07-15 ENCOUNTER — Ambulatory Visit (INDEPENDENT_AMBULATORY_CARE_PROVIDER_SITE_OTHER): Payer: BLUE CROSS/BLUE SHIELD | Admitting: Vascular Surgery

## 2017-07-15 DIAGNOSIS — I771 Stricture of artery: Secondary | ICD-10-CM | POA: Diagnosis not present

## 2017-07-15 DIAGNOSIS — I6522 Occlusion and stenosis of left carotid artery: Secondary | ICD-10-CM

## 2017-07-15 NOTE — Progress Notes (Signed)
Requested by:  Rusty Aus, MD [place] [place] [facility] [place], [place] [address]  Reason for consultation: left subclavian artery stenosis    History of Present Illness   Misty Adams is a 62 y.o. ([birth year]) female who presents with cc: asymmetric blood pressure measurements.  Patient was seen by her PCP and found to have different BP in each arm.  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    Currently patient has no arm sx: no weakness or para/aesthesia.  She has no claudication equivalent sx. Atherosclerotic risk factors include: DM, HLD, HTN and smoking.   Past Medical History:  Diagnosis Date  . Diabetes mellitus without complication ([place])   . Hyperlipidemia   . Hypertension   . Hypothyroidism   . Psoriasis   . Tobacco abuse     Past Surgical History:  Procedure Laterality Date  . ABDOMINAL HYSTERECTOMY    . APPENDECTOMY    . CESAREAN SECTION    . dilatation and curettage    . KNEE SURGERY    . left wrist ganglion cyst removal       Social History   Socioeconomic History  . Marital status: Divorced    Spouse name: Not on file  . Number of children: Not on file  . Years of education: Not on file  . Highest education level: Not on file  Occupational History  . Not on file  Social Needs  . Financial resource strain: Not on file  . Food insecurity:    Worry: Not on file    Inability: Not on file  . Transportation needs:    Medical: Not on file    Non-medical: Not on file  Tobacco Use  . Smoking status: Current Every Day Smoker    Packs/day: 2.00    Types: Cigarettes  . Smokeless tobacco: Never Used  Substance and Sexual Activity  . Alcohol use: Yes  . Drug use: Not on file  . Sexual activity: Not on file  Lifestyle  . Physical activity:    Days per week: Not on file    Minutes per session: Not on file  . Stress: Not on file  Relationships  . Social connections:      Talks on phone: Not on file    Gets together: Not on file    Attends religious service: Not on file    Active member of club or organization: Not on file    Attends meetings of clubs or organizations: Not on file    Relationship status: Not on file  . Intimate partner violence:    Fear of current or ex partner: Not on file    Emotionally abused: Not on file    Physically abused: Not on file    Forced sexual activity: Not on file  Other Topics Concern  . Not on file  Social History Narrative  . Not on file    Family History  Problem Relation Age of Onset  . Heart attack Father     Current Outpatient Medications  Medication Sig Dispense Refill  . albuterol (PROVENTIL HFA;VENTOLIN HFA) 108 (90 Base) MCG/ACT inhaler Inhale 2 puffs into the lungs every 6 (six) hours as needed for wheezing or shortness of breath. 1 Inhaler 0  . ALPRAZolam (XANAX) 0.5 MG tablet Take by mouth.    Marland Kitchen aspirin 81 MG tablet Take 81 mg by mouth daily.    Marland Kitchen atorvastatin (LIPITOR) 40  MG tablet Take 1 tablet (40 mg total) by mouth at bedtime. 30 tablet 0  . clopidogrel (PLAVIX) 75 MG tablet Take 1 tablet (75 mg total) by mouth daily. 30 tablet 0  . dicyclomine (BENTYL) 10 MG capsule Take by mouth.    . furosemide (LASIX) 20 MG tablet TAKE ONE TABLET BY MOUTH ONCE DAILY    . hydrochlorothiazide (HYDRODIURIL) 25 MG tablet Take 25 mg by mouth daily.    Marland Kitchen ibuprofen (ADVIL,MOTRIN) 800 MG tablet TAKE ONE TABLET BY MOUTH EVERY 8 HOURS AS NEEDED FOR PAIN    . levothyroxine (SYNTHROID, LEVOTHROID) 100 MCG tablet Take 100 mcg by mouth daily.    . metFORMIN (GLUCOPHAGE) 500 MG tablet Take by mouth.    . pantoprazole (PROTONIX) 20 MG tablet Take by mouth.    . sertraline (ZOLOFT) 100 MG tablet TAKE ONE TABLET BY MOUTH ONCE DAILY    . polyethylene glycol-electrolytes (NULYTELY/GOLYTELY) 420 g solution Take as directed for colonic prep.    . potassium chloride (K-DUR) 10 MEQ tablet Take by mouth.    . potassium chloride  (MICRO-K) 10 MEQ CR capsule Take by mouth.     No current facility-administered medications for this visit.     Allergies  Allergen Reactions  . Hydrocodone-Acetaminophen Hives  . Other Hives    Uncoded Allergy. Allergen: Vicodan  . Vicodin [Hydrocodone-Acetaminophen]     REVIEW OF SYSTEMS (negative unless checked):   Cardiac:  []  Chest pain or chest pressure? [x]  Shortness of breath upon activity? []  Shortness of breath when lying flat? []  Irregular heart rhythm?  Vascular:  [x]  Pain in calf, thigh, or hip brought on by walking? []  Pain in feet at night that wakes you up from your sleep? []  Blood clot in your veins? [x]  Leg swelling?  Pulmonary:  []  Oxygen at home? []  Productive cough? [x]  Wheezing?  Neurologic:  []  Sudden weakness in arms or legs? []  Sudden numbness in arms or legs? []  Sudden onset of difficult speaking or slurred speech? []  Temporary loss of vision in one eye? []  Problems with dizziness?  Gastrointestinal:  [x]  Blood in stool? []  Vomited blood?  Genitourinary:  []  Burning when urinating? []  Blood in urine?  Psychiatric:  []  Major depression  Hematologic:  []  Bleeding problems? []  Problems with blood clotting?  Dermatologic:  []  Rashes or ulcers?  Constitutional:  [x]  Fever or chills?  Ear/Nose/Throat:  []  Change in hearing? []  Nose bleeds? []  Sore throat?  Musculoskeletal:  []  Back pain? []  Joint pain? []  Muscle pain?   For VQI Use Only   PRE-ADM LIVING Home  AMB STATUS Ambulatory  CAD Sx None  PRIOR CHF None  STRESS TEST No    Physical Examination     Vitals:   07/15/17 1453  BP: 140/75  Pulse: 75  Resp: 16  Temp: 97.8 F (36.6 C)  TempSrc: Oral  SpO2: 96%  Weight: 204 lb (92.5 kg)  Height: 5\' 3"  (1.6 m)   Body mass index is 36.14 kg/m.  General Alert, O x 3, WD, NAD  Head [place]/AT,    Ear/Nose/ Throat Hearing grossly intact, nares without erythema or drainage, oropharynx without Erythema or  Exudate, Mallampati score: 3,   Eyes PERRLA, EOMI,    Neck Supple, mid-line trachea,    Pulmonary Sym exp, good B air movt, CTA B  Cardiac RRR, Nl S1, S2, no Murmurs, No rubs, No S3,S4  Vascular Vessel Right Left  Radial Palpable Not palpable  Brachial Palpable Not palpable  Carotid Palpable, No Bruit Palpable, No Bruit  Aorta Not palpable N/A  Femoral Palpable Palpable  Popliteal Not palpable Not palpable  PT Palpable Palpable  DP Palpable Palpable    Gastro- intestinal soft, non-distended, non-tender to palpation, No guarding or rebound, no HSM, no masses, no CVAT B, No palpable prominent aortic pulse,    Musculo- skeletal M/S 5/5 throughout  , Extremities without ischemic changes  , No edema present, B spider with small varicosities, No Lipodermatosclerosis present  Neurologic Cranial nerves 2-12 intact , Pain and light touch intact in extremities , Motor exam as listed above  Psychiatric Judgement intact, Mood & affect appropriate for pt's clinical situation  Dermatologic See M/S exam for extremity exam, No rashes otherwise noted  Lymphatic  Palpable lymph nodes: None    Non-invasive Vascular Imaging     L Carotid Duplex (07/15/2017):   Left Carotid Findings:  PSV cm/s EDV cm/s Stenosis Describe Comments  CCA Prox 89 14     CCA Mid 89 19     CCA Distal 101 20     ICA Prox 94 20     ICA Mid 86 26     ICA Distal 63 15     ECA 137 16       Subclavian PSV cm/s EDV cm/s Describe Arm Pressure (mmHG)   387  Stenotic     Vertebral PSV cm/s 66 EDV cm/s Antegrade    Mid subclavian velocity = 77 cm/s with post stenotic turbulance noted. The distal subclavian velocity = 52 cm/s    Final Interpretation:  Left Carotid: There is no evidence of stenosis in the left ICA.    Vertebrals: Left vertebral artery demonstrates antegrade flow.    Subclavians: Left subclavian artery was stenotic. Left subclavian artery flow was disturbed. Elevated velocities noted in the left proximal  subclavian artery suggestive of a >50% stenosis. It is noted that the left vertebral artery demonstrated antegrade flow.     Radiology     Dg Chest Port 1 View  Result Date: 07/13/2017 CLINICAL DATA:  Shortness of breath EXAM: PORTABLE CHEST 1 VIEW COMPARISON:  09/21/2013 FINDINGS: Diffuse bronchitic changes with atelectasis at the bases. No consolidation or effusion. Cardiomediastinal silhouette within normal limits. No pneumothorax. IMPRESSION: Chronic bronchitic changes. Electronically Signed   By: Donavan Foil M.D.   On: 07/13/2017 23:17   Ct Angio Chest/abd/pel For Dissection W And/or W/wo  Result Date: 07/14/2017 CLINICAL DATA:  Asymmetric upper extremity pulse. EXAM: CT ANGIOGRAPHY CHEST, ABDOMEN AND PELVIS TECHNIQUE: Multidetector CT imaging through the chest, abdomen and pelvis was performed using the standard protocol during bolus administration of intravenous contrast. Multiplanar reconstructed images and MIPs were obtained and reviewed to evaluate the vascular anatomy. CONTRAST:  160mL ISOVUE-370 IOPAMIDOL (ISOVUE-370) INJECTION 76% COMPARISON:  CT abdomen pelvis 07/09/2017 CT chest, abdomen and pelvis 08/09/2013 FINDINGS: CTA CHEST FINDINGS Cardiovascular: The heart size is normal. There is nopericardial effusion. The course and caliber of the thoracic aorta are normal. There is intermediate aortic atherosclerotic calcification. Precontrast images show no aortic intramural hematoma. There is no blood pool, dissection or penetrating ulcer demonstrated on arterial phase postcontrast imaging. Normal variant aortic arch branching pattern with the left vertebral artery arising independently from the aortic arch. There is approximately 90% stenosis of the proximal left subclavian artery. Normal opacification of the remainder of the visual lysed left subclavian artery. The central pulmonary arteries are normal. Mediastinum/Nodes: No mediastinal, hilar or axillary lymphadenopathy. The visualized  thyroid and thoracic esophageal  course are unremarkable. Lungs/Pleura: Perifissural nodule along the right minor fissure measures 9 mm. Musculoskeletal: No chest wall abnormality. No acute osseous findings. Review of the MIP images confirms the above findings. CTA ABDOMEN AND PELVIS FINDINGS VASCULAR Aorta: Normal caliber aorta without aneurysm, dissection, vasculitis or hemodynamically significant stenosis. There is a large amount of atherosclerotic plaque within the infrarenal abdominal aorta, causing approximately 30% luminal narrowing. Celiac: No aneurysm, dissection or hemodynamically significant stenosis. Normal branching pattern. SMA: Moderate narrowing at the origin of the SMA. Renals: There are single renal arteries bilaterally. There is moderate-to-severe stenosis of the origin of the left renal artery. No aneurysm, dissection, or evidence of fibromuscular dysplasia. IMA: Patent. Inflow: Extensive atherosclerotic calcification of the bilateral common, internal and external iliac arteries. The arteries remain patent. Veins: Normal course and caliber of the major veins. Assessment is otherwise limited by the arterial dominant contrast phase. Review of the MIP images confirms the above findings. NON-VASCULAR Hepatobiliary: Hypoattenuation of the liver meets the threshold for hepatic steatosis. Normal gallbladder. Pancreas: Normal contours without ductal dilatation. No peripancreatic fluid collection. Spleen: Normal arterial phase splenic enhancement pattern. Adrenals/Urinary Tract: --Adrenal glands: Left adrenal nodule measures 18 x 16 mm with intermediate attenuation. This is unchanged and previously identified as an adrenal adenoma. Right adrenal gland is normal. --Right kidney/ureter: No hydronephrosis or perinephric stranding. No nephrolithiasis. No obstructing ureteral stones. --Left kidney/ureter: No hydronephrosis or perinephric stranding. No nephrolithiasis. No obstructing ureteral stones. --Urinary  bladder: Unremarkable. Stomach/Bowel: --Stomach/Duodenum: No hiatal hernia or other gastric abnormality. Normal duodenal course and caliber. --Small bowel: No dilatation or inflammation. --Colon: No focal abnormality. --Appendix: Not visualized. No right lower quadrant inflammation or free fluid. Lymphatic:  No abdominal or pelvic lymphadenopathy. Reproductive: Status post hysterectomy. No adnexal mass. Musculoskeletal. No bony spinal canal stenosis or focal osseous abnormality. Other: None. Review of the MIP images confirms the above findings. IMPRESSION: 1. 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 Distal left subclavian artery remains opacified. This is in keeping with reported left upper extremity symptoms. 2. Extensive aortic atherosclerosis (ICD10-I70.0), worst in the infrarenal abdominal aorta, where there is approximately 30% luminal narrowing due to predominantly noncalcified plaque. 3. 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 4. Hepatic steatosis. These results were called by telephone at the time of interpretation on 07/13/2017 at 11:56 pm to Dr. Darel Hong , who verbally acknowledged these results. Electronically Signed   By: Ulyses Jarred M.D.   On: 07/14/2017 00:00   I reviewed this patient's CTA, she has a high grade stenosis in the proximal L SCA with extensive plaque vs thrombus.  The left vertebral artery orginates form the aortic arch separately.  Outside Studies/Documentation   6 pages of outside documents were reviewed including: outside PCP chart.   Medical Decision Making   Misty Adams is a 62 y.o. female who presents with: asx L SCA sub-total occlusion, variant takeoff of L vertebral artery   Normally, I have not aggressively pursue subclavian artery interventions as it has been rare that patient develops lifestyle limitation due to L SCA  occlusion.  In this patient, her L vertebral artery originates completely separate from the SCA, eliminating the risk of posterior stroke with intervention of the L SCA.  Her SCA is also sub-total occluded, so preserving the L SCA for a potential future LIMA bypass is enough to tip the scales toward recommending intervention.  Based  on the patient's vascular studies and examination, I have offered the patient: Arch aortogram, L SCA stenting and angioplasty. I discussed with the patient the nature of angiographic procedures, especially the limited patencies of any endovascular intervention.   The patient is aware of that the risks of an angiographic procedure include but are not limited to: bleeding, infection, access site complications, renal failure, embolization, rupture of vessel, dissection, arteriovenous fistula, possible need for emergent surgical intervention, possible need for surgical procedures to treat the patient's pathology, anaphylactic reaction to contrast, and stroke and death.   The patient is aware of the risks and agrees to proceed.  She is scheduled for 18 APR 19. Pt is going to undergo colonoscopy on 15 APR 19, so it would be better to let the colonoscopy proceed first, as the patient will need to hold her anti-platelets meanwhile. Any intervention will likely need Plavix bolus afterwards.  I discussed in depth with the patient the nature of atherosclerosis, and emphasized the importance of maximal medical management including strict control of blood pressure, blood glucose, and lipid levels, obtaining regular exercise, antiplatelet agents, and cessation of smoking.   The patient is currently not on on statin as not medically indicated.  The patient is currently on an anti-platelet: ASA and Plavix.  The patient is aware that without maximal medical management the underlying atherosclerotic disease process will progress, limiting the benefit of any interventions.  Thank you for  allowing [place] to participate in this patient's care.   Adele Barthel, MD, FACS [facility] Office: [phone] Pager: [phone]  07/15/2017, 3:53 PM

## 2017-07-15 NOTE — H&P (View-Only) (Signed)
Requested by:  Rusty Aus, MD [facility] [place], [place] [address]  Reason for consultation: left subclavian artery stenosis    History of Present Illness   Misty Adams is a 62 y.o. (1955/04/30) female who presents with cc: asymmetric blood pressure measurements.  Patient was seen by her PCP and found to have different BP in each arm.  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    Currently patient has no arm sx: no weakness or para/aesthesia.  She has no claudication equivalent sx. Atherosclerotic risk factors include: DM, HLD, HTN and smoking.   Past Medical History:  Diagnosis Date  . Diabetes mellitus without complication ([place])   . Hyperlipidemia   . Hypertension   . Hypothyroidism   . Psoriasis   . Tobacco abuse     Past Surgical History:  Procedure Laterality Date  . ABDOMINAL HYSTERECTOMY    . APPENDECTOMY    . CESAREAN SECTION    . dilatation and curettage    . KNEE SURGERY    . left wrist ganglion cyst removal       Social History   Socioeconomic History  . Marital status: Divorced    Spouse name: Not on file  . Number of children: Not on file  . Years of education: Not on file  . Highest education level: Not on file  Occupational History  . Not on file  Social Needs  . Financial resource strain: Not on file  . Food insecurity:    Worry: Not on file    Inability: Not on file  . Transportation needs:    Medical: Not on file    Non-medical: Not on file  Tobacco Use  . Smoking status: Current Every Day Smoker    Packs/day: 2.00    Types: Cigarettes  . Smokeless tobacco: Never Used  Substance and Sexual Activity  . Alcohol use: Yes  . Drug use: Not on file  . Sexual activity: Not on file  Lifestyle  . Physical activity:    Days per week: Not on file    Minutes per session: Not on file  . Stress: Not on file  Relationships  . Social connections:      Talks on phone: Not on file    Gets together: Not on file    Attends religious service: Not on file    Active member of club or organization: Not on file    Attends meetings of clubs or organizations: Not on file    Relationship status: Not on file  . Intimate partner violence:    Fear of current or ex partner: Not on file    Emotionally abused: Not on file    Physically abused: Not on file    Forced sexual activity: Not on file  Other Topics Concern  . Not on file  Social History Narrative  . Not on file    Family History  Problem Relation Age of Onset  . Heart attack Father     Current Outpatient Medications  Medication Sig Dispense Refill  . albuterol (PROVENTIL HFA;VENTOLIN HFA) 108 (90 Base) MCG/ACT inhaler Inhale 2 puffs into the lungs every 6 (six) hours as needed for wheezing or shortness of breath. 1 Inhaler 0  . ALPRAZolam (XANAX) 0.5 MG tablet Take by mouth.    Marland Kitchen aspirin 81 MG tablet Take 81 mg by mouth daily.    Marland Kitchen atorvastatin (LIPITOR) 40  MG tablet Take 1 tablet (40 mg total) by mouth at bedtime. 30 tablet 0  . clopidogrel (PLAVIX) 75 MG tablet Take 1 tablet (75 mg total) by mouth daily. 30 tablet 0  . dicyclomine (BENTYL) 10 MG capsule Take by mouth.    . furosemide (LASIX) 20 MG tablet TAKE ONE TABLET BY MOUTH ONCE DAILY    . hydrochlorothiazide (HYDRODIURIL) 25 MG tablet Take 25 mg by mouth daily.    Marland Kitchen ibuprofen (ADVIL,MOTRIN) 800 MG tablet TAKE ONE TABLET BY MOUTH EVERY 8 HOURS AS NEEDED FOR PAIN    . levothyroxine (SYNTHROID, LEVOTHROID) 100 MCG tablet Take 100 mcg by mouth daily.    . metFORMIN (GLUCOPHAGE) 500 MG tablet Take by mouth.    . pantoprazole (PROTONIX) 20 MG tablet Take by mouth.    . sertraline (ZOLOFT) 100 MG tablet TAKE ONE TABLET BY MOUTH ONCE DAILY    . polyethylene glycol-electrolytes (NULYTELY/GOLYTELY) 420 g solution Take as directed for colonic prep.    . potassium chloride (K-DUR) 10 MEQ tablet Take by mouth.    . potassium chloride  (MICRO-K) 10 MEQ CR capsule Take by mouth.     No current facility-administered medications for this visit.     Allergies  Allergen Reactions  . Hydrocodone-Acetaminophen Hives  . Other Hives    Uncoded Allergy. Allergen: Vicodan  . Vicodin [Hydrocodone-Acetaminophen]     REVIEW OF SYSTEMS (negative unless checked):   Cardiac:  []  Chest pain or chest pressure? [x]  Shortness of breath upon activity? []  Shortness of breath when lying flat? []  Irregular heart rhythm?  Vascular:  [x]  Pain in calf, thigh, or hip brought on by walking? []  Pain in feet at night that wakes you up from your sleep? []  Blood clot in your veins? [x]  Leg swelling?  Pulmonary:  []  Oxygen at home? []  Productive cough? [x]  Wheezing?  Neurologic:  []  Sudden weakness in arms or legs? []  Sudden numbness in arms or legs? []  Sudden onset of difficult speaking or slurred speech? []  Temporary loss of vision in one eye? []  Problems with dizziness?  Gastrointestinal:  [x]  Blood in stool? []  Vomited blood?  Genitourinary:  []  Burning when urinating? []  Blood in urine?  Psychiatric:  []  Major depression  Hematologic:  []  Bleeding problems? []  Problems with blood clotting?  Dermatologic:  []  Rashes or ulcers?  Constitutional:  [x]  Fever or chills?  Ear/Nose/Throat:  []  Change in hearing? []  Nose bleeds? []  Sore throat?  Musculoskeletal:  []  Back pain? []  Joint pain? []  Muscle pain?   For VQI Use Only   PRE-ADM LIVING Home  AMB STATUS Ambulatory  CAD Sx None  PRIOR CHF None  STRESS TEST No    Physical Examination     Vitals:   07/15/17 1453  BP: 140/75  Pulse: 75  Resp: 16  Temp: 97.8 F (36.6 C)  TempSrc: Oral  SpO2: 96%  Weight: 204 lb (92.5 kg)  Height: 5\' 3"  (1.6 m)   Body mass index is 36.14 kg/m.  General Alert, O x 3, WD, NAD  Head [place]/AT,    Ear/Nose/ Throat Hearing grossly intact, nares without erythema or drainage, oropharynx without Erythema or  Exudate, Mallampati score: 3,   Eyes PERRLA, EOMI,    Neck Supple, mid-line trachea,    Pulmonary Sym exp, good B air movt, CTA B  Cardiac RRR, Nl S1, S2, no Murmurs, No rubs, No S3,S4  Vascular Vessel Right Left  Radial Palpable Not palpable  Brachial Palpable Not palpable  Carotid Palpable, No Bruit Palpable, No Bruit  Aorta Not palpable N/A  Femoral Palpable Palpable  Popliteal Not palpable Not palpable  PT Palpable Palpable  DP Palpable Palpable    Gastro- intestinal soft, non-distended, non-tender to palpation, No guarding or rebound, no HSM, no masses, no CVAT B, No palpable prominent aortic pulse,    Musculo- skeletal M/S 5/5 throughout  , Extremities without ischemic changes  , No edema present, B spider with small varicosities, No Lipodermatosclerosis present  Neurologic Cranial nerves 2-12 intact , Pain and light touch intact in extremities , Motor exam as listed above  Psychiatric Judgement intact, Mood & affect appropriate for pt's clinical situation  Dermatologic See M/S exam for extremity exam, No rashes otherwise noted  Lymphatic  Palpable lymph nodes: None    Non-invasive Vascular Imaging     L Carotid Duplex (07/15/2017):   Left Carotid Findings:  PSV cm/s EDV cm/s Stenosis Describe Comments  CCA Prox 89 14     CCA Mid 89 19     CCA Distal 101 20     ICA Prox 94 20     ICA Mid 86 26     ICA Distal 63 15     ECA 137 16       Subclavian PSV cm/s EDV cm/s Describe Arm Pressure (mmHG)   387  Stenotic     Vertebral PSV cm/s 66 EDV cm/s Antegrade    Mid subclavian velocity = 77 cm/s with post stenotic turbulance noted. The distal subclavian velocity = 52 cm/s    Final Interpretation:  Left Carotid: There is no evidence of stenosis in the left ICA.    Vertebrals: Left vertebral artery demonstrates antegrade flow.    Subclavians: Left subclavian artery was stenotic. Left subclavian artery flow was disturbed. Elevated velocities noted in the left proximal  subclavian artery suggestive of a >50% stenosis. It is noted that the left vertebral artery demonstrated antegrade flow.     Radiology     Dg Chest Port 1 View  Result Date: 07/13/2017 CLINICAL DATA:  Shortness of breath EXAM: PORTABLE CHEST 1 VIEW COMPARISON:  09/21/2013 FINDINGS: Diffuse bronchitic changes with atelectasis at the bases. No consolidation or effusion. Cardiomediastinal silhouette within normal limits. No pneumothorax. IMPRESSION: Chronic bronchitic changes. Electronically Signed   By: Donavan Foil M.D.   On: 07/13/2017 23:17   Ct Angio Chest/abd/pel For Dissection W And/or W/wo  Result Date: 07/14/2017 CLINICAL DATA:  Asymmetric upper extremity pulse. EXAM: CT ANGIOGRAPHY CHEST, ABDOMEN AND PELVIS TECHNIQUE: Multidetector CT imaging through the chest, abdomen and pelvis was performed using the standard protocol during bolus administration of intravenous contrast. Multiplanar reconstructed images and MIPs were obtained and reviewed to evaluate the vascular anatomy. CONTRAST:  122mL ISOVUE-370 IOPAMIDOL (ISOVUE-370) INJECTION 76% COMPARISON:  CT abdomen pelvis 07/09/2017 CT chest, abdomen and pelvis 08/09/2013 FINDINGS: CTA CHEST FINDINGS Cardiovascular: The heart size is normal. There is nopericardial effusion. The course and caliber of the thoracic aorta are normal. There is intermediate aortic atherosclerotic calcification. Precontrast images show no aortic intramural hematoma. There is no blood pool, dissection or penetrating ulcer demonstrated on arterial phase postcontrast imaging. Normal variant aortic arch branching pattern with the left vertebral artery arising independently from the aortic arch. There is approximately 90% stenosis of the proximal left subclavian artery. Normal opacification of the remainder of the visual lysed left subclavian artery. The central pulmonary arteries are normal. Mediastinum/Nodes: No mediastinal, hilar or axillary lymphadenopathy. The visualized  thyroid and thoracic esophageal  course are unremarkable. Lungs/Pleura: Perifissural nodule along the right minor fissure measures 9 mm. Musculoskeletal: No chest wall abnormality. No acute osseous findings. Review of the MIP images confirms the above findings. CTA ABDOMEN AND PELVIS FINDINGS VASCULAR Aorta: Normal caliber aorta without aneurysm, dissection, vasculitis or hemodynamically significant stenosis. There is a large amount of atherosclerotic plaque within the infrarenal abdominal aorta, causing approximately 30% luminal narrowing. Celiac: No aneurysm, dissection or hemodynamically significant stenosis. Normal branching pattern. SMA: Moderate narrowing at the origin of the SMA. Renals: There are single renal arteries bilaterally. There is moderate-to-severe stenosis of the origin of the left renal artery. No aneurysm, dissection, or evidence of fibromuscular dysplasia. IMA: Patent. Inflow: Extensive atherosclerotic calcification of the bilateral common, internal and external iliac arteries. The arteries remain patent. Veins: Normal course and caliber of the major veins. Assessment is otherwise limited by the arterial dominant contrast phase. Review of the MIP images confirms the above findings. NON-VASCULAR Hepatobiliary: Hypoattenuation of the liver meets the threshold for hepatic steatosis. Normal gallbladder. Pancreas: Normal contours without ductal dilatation. No peripancreatic fluid collection. Spleen: Normal arterial phase splenic enhancement pattern. Adrenals/Urinary Tract: --Adrenal glands: Left adrenal nodule measures 18 x 16 mm with intermediate attenuation. This is unchanged and previously identified as an adrenal adenoma. Right adrenal gland is normal. --Right kidney/ureter: No hydronephrosis or perinephric stranding. No nephrolithiasis. No obstructing ureteral stones. --Left kidney/ureter: No hydronephrosis or perinephric stranding. No nephrolithiasis. No obstructing ureteral stones. --Urinary  bladder: Unremarkable. Stomach/Bowel: --Stomach/Duodenum: No hiatal hernia or other gastric abnormality. Normal duodenal course and caliber. --Small bowel: No dilatation or inflammation. --Colon: No focal abnormality. --Appendix: Not visualized. No right lower quadrant inflammation or free fluid. Lymphatic:  No abdominal or pelvic lymphadenopathy. Reproductive: Status post hysterectomy. No adnexal mass. Musculoskeletal. No bony spinal canal stenosis or focal osseous abnormality. Other: None. Review of the MIP images confirms the above findings. IMPRESSION: 1. 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 Distal left subclavian artery remains opacified. This is in keeping with reported left upper extremity symptoms. 2. Extensive aortic atherosclerosis (ICD10-I70.0), worst in the infrarenal abdominal aorta, where there is approximately 30% luminal narrowing due to predominantly noncalcified plaque. 3. 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 4. Hepatic steatosis. These results were called by telephone at the time of interpretation on 07/13/2017 at 11:56 pm to Dr. Darel Hong , who verbally acknowledged these results. Electronically Signed   By: Ulyses Jarred M.D.   On: 07/14/2017 00:00   I reviewed this patient's CTA, she has a high grade stenosis in the proximal L SCA with extensive plaque vs thrombus.  The left vertebral artery orginates form the aortic arch separately.  Outside Studies/Documentation   6 pages of outside documents were reviewed including: outside PCP chart.   Medical Decision Making   SAVERA DONSON is a 62 y.o. female who presents with: asx L SCA sub-total occlusion, variant takeoff of L vertebral artery   Normally, I have not aggressively pursue subclavian artery interventions as it has been rare that patient develops lifestyle limitation due to L SCA  occlusion.  In this patient, her L vertebral artery originates completely separate from the SCA, eliminating the risk of posterior stroke with intervention of the L SCA.  Her SCA is also sub-total occluded, so preserving the L SCA for a potential future LIMA bypass is enough to tip the scales toward recommending intervention.  Based  on the patient's vascular studies and examination, I have offered the patient: Arch aortogram, L SCA stenting and angioplasty. I discussed with the patient the nature of angiographic procedures, especially the limited patencies of any endovascular intervention.   The patient is aware of that the risks of an angiographic procedure include but are not limited to: bleeding, infection, access site complications, renal failure, embolization, rupture of vessel, dissection, arteriovenous fistula, possible need for emergent surgical intervention, possible need for surgical procedures to treat the patient's pathology, anaphylactic reaction to contrast, and stroke and death.   The patient is aware of the risks and agrees to proceed.  She is scheduled for 18 APR 19. Pt is going to undergo colonoscopy on 15 APR 19, so it would be better to let the colonoscopy proceed first, as the patient will need to hold her anti-platelets meanwhile. Any intervention will likely need Plavix bolus afterwards.  I discussed in depth with the patient the nature of atherosclerosis, and emphasized the importance of maximal medical management including strict control of blood pressure, blood glucose, and lipid levels, obtaining regular exercise, antiplatelet agents, and cessation of smoking.   The patient is currently not on on statin as not medically indicated.  The patient is currently on an anti-platelet: ASA and Plavix.  The patient is aware that without maximal medical management the underlying atherosclerotic disease process will progress, limiting the benefit of any interventions.  Thank you for  allowing [place] to participate in this patient's care.   Adele Barthel, MD, FACS [facility] Office: [phone] Pager: [phone]  07/15/2017, 3:53 PM

## 2017-07-16 ENCOUNTER — Other Ambulatory Visit: Payer: Self-pay | Admitting: *Deleted

## 2017-07-21 DIAGNOSIS — E1151 Type 2 diabetes mellitus with diabetic peripheral angiopathy without gangrene: Secondary | ICD-10-CM | POA: Diagnosis present

## 2017-07-21 DIAGNOSIS — I739 Peripheral vascular disease, unspecified: Secondary | ICD-10-CM | POA: Insufficient documentation

## 2017-08-06 ENCOUNTER — Ambulatory Visit (HOSPITAL_COMMUNITY)
Admission: RE | Admit: 2017-08-06 | Discharge: 2017-08-06 | Disposition: A | Discharge status: Home / Self Care | Payer: BLUE CROSS/BLUE SHIELD | Source: Ambulatory Visit | Attending: Vascular Surgery | Admitting: Vascular Surgery

## 2017-08-06 ENCOUNTER — Ambulatory Visit (HOSPITAL_COMMUNITY)
Admission: RE | Disposition: A | Discharge status: Home / Self Care | Payer: Self-pay | Source: Ambulatory Visit | Attending: Vascular Surgery

## 2017-08-06 DIAGNOSIS — Z885 Allergy status to narcotic agent status: Secondary | ICD-10-CM | POA: Diagnosis not present

## 2017-08-06 DIAGNOSIS — Z79890 Hormone replacement therapy (postmenopausal): Secondary | ICD-10-CM | POA: Diagnosis not present

## 2017-08-06 DIAGNOSIS — I771 Stricture of artery: Secondary | ICD-10-CM | POA: Diagnosis present

## 2017-08-06 DIAGNOSIS — L409 Psoriasis, unspecified: Secondary | ICD-10-CM | POA: Insufficient documentation

## 2017-08-06 DIAGNOSIS — Z8249 Family history of ischemic heart disease and other diseases of the circulatory system: Secondary | ICD-10-CM | POA: Insufficient documentation

## 2017-08-06 DIAGNOSIS — I739 Peripheral vascular disease, unspecified: Secondary | ICD-10-CM

## 2017-08-06 DIAGNOSIS — Z9889 Other specified postprocedural states: Secondary | ICD-10-CM | POA: Diagnosis not present

## 2017-08-06 DIAGNOSIS — Z90710 Acquired absence of both cervix and uterus: Secondary | ICD-10-CM | POA: Insufficient documentation

## 2017-08-06 DIAGNOSIS — E119 Type 2 diabetes mellitus without complications: Secondary | ICD-10-CM | POA: Insufficient documentation

## 2017-08-06 DIAGNOSIS — Z7902 Long term (current) use of antithrombotics/antiplatelets: Secondary | ICD-10-CM | POA: Insufficient documentation

## 2017-08-06 DIAGNOSIS — E785 Hyperlipidemia, unspecified: Secondary | ICD-10-CM | POA: Diagnosis not present

## 2017-08-06 DIAGNOSIS — Z79899 Other long term (current) drug therapy: Secondary | ICD-10-CM | POA: Insufficient documentation

## 2017-08-06 DIAGNOSIS — Z7982 Long term (current) use of aspirin: Secondary | ICD-10-CM | POA: Diagnosis not present

## 2017-08-06 DIAGNOSIS — F17210 Nicotine dependence, cigarettes, uncomplicated: Secondary | ICD-10-CM | POA: Diagnosis not present

## 2017-08-06 DIAGNOSIS — Z7984 Long term (current) use of oral hypoglycemic drugs: Secondary | ICD-10-CM | POA: Diagnosis not present

## 2017-08-06 DIAGNOSIS — I10 Essential (primary) hypertension: Secondary | ICD-10-CM | POA: Insufficient documentation

## 2017-08-06 DIAGNOSIS — E039 Hypothyroidism, unspecified: Secondary | ICD-10-CM | POA: Insufficient documentation

## 2017-08-06 HISTORY — PX: AORTIC ARCH ANGIOGRAPHY: CATH118224

## 2017-08-06 HISTORY — PX: PERIPHERAL VASCULAR INTERVENTION: CATH118257

## 2017-08-06 LAB — POCT I-STAT, CHEM 8
BUN: 22 mg/dL — ABNORMAL HIGH (ref 6–20)
CREATININE: 0.7 mg/dL (ref 0.44–1.00)
Calcium, Ion: 1.08 mmol/L — ABNORMAL LOW (ref 1.15–1.40)
Chloride: 94 mmol/L — ABNORMAL LOW (ref 101–111)
Glucose, Bld: 117 mg/dL — ABNORMAL HIGH (ref 65–99)
HEMATOCRIT: 50 % — AB (ref 36.0–46.0)
Hemoglobin: 17 g/dL — ABNORMAL HIGH (ref 12.0–15.0)
POTASSIUM: 3.7 mmol/L (ref 3.5–5.1)
SODIUM: 137 mmol/L (ref 135–145)
TCO2: 36 mmol/L — ABNORMAL HIGH (ref 22–32)

## 2017-08-06 LAB — GLUCOSE, CAPILLARY: Glucose-Capillary: 102 mg/dL — ABNORMAL HIGH (ref 65–99)

## 2017-08-06 LAB — POCT ACTIVATED CLOTTING TIME
Activated Clotting Time: 208 seconds
Activated Clotting Time: 263 seconds
Activated Clotting Time: 158 seconds

## 2017-08-06 SURGERY — AORTIC ARCH ANGIOGRAPHY
Anesthesia: LOCAL

## 2017-08-06 MED ORDER — LIDOCAINE HCL (PF) 1 % IJ SOLN
INTRAMUSCULAR | Status: AC
  Filled 2017-08-06: qty 30

## 2017-08-06 MED ORDER — HYDRALAZINE HCL 20 MG/ML IJ SOLN: 5.0000 mg | INTRAMUSCULAR | Status: DC | PRN

## 2017-08-06 MED ORDER — LIDOCAINE HCL (PF) 1 % IJ SOLN
INTRAMUSCULAR | Status: DC | PRN
  Administered 2017-08-06: 15 mL

## 2017-08-06 MED ORDER — SODIUM CHLORIDE 0.9% FLUSH: 3.0000 mL | INTRAVENOUS | Status: DC | PRN

## 2017-08-06 MED ORDER — NITROGLYCERIN 1 MG/10 ML FOR IR/CATH LAB
INTRA_ARTERIAL | Status: DC | PRN
  Administered 2017-08-06: 200 ug via INTRA_ARTERIAL

## 2017-08-06 MED ORDER — LABETALOL HCL 5 MG/ML IV SOLN: 10.0000 mg | INTRAVENOUS | Status: DC | PRN

## 2017-08-06 MED ORDER — SODIUM CHLORIDE 0.9% FLUSH: 3.0000 mL | Freq: Two times a day (BID) | INTRAVENOUS | Status: DC

## 2017-08-06 MED ORDER — FENTANYL CITRATE (PF) 100 MCG/2ML IJ SOLN
INTRAMUSCULAR | Status: AC
  Filled 2017-08-06: qty 2

## 2017-08-06 MED ORDER — HEPARIN (PORCINE) IN NACL 1000-0.9 UT/500ML-% IV SOLN
INTRAVENOUS | Status: AC
  Filled 2017-08-06: qty 1000

## 2017-08-06 MED ORDER — HEPARIN (PORCINE) IN NACL 2-0.9 UNITS/ML
INTRAMUSCULAR | Status: AC | PRN
  Administered 2017-08-06: 1000 mL via INTRA_ARTERIAL

## 2017-08-06 MED ORDER — ONDANSETRON HCL 4 MG/2ML IJ SOLN: 4.0000 mg | Freq: Four times a day (QID) | INTRAMUSCULAR | Status: DC | PRN

## 2017-08-06 MED ORDER — FENTANYL CITRATE (PF) 100 MCG/2ML IJ SOLN
INTRAMUSCULAR | Status: DC | PRN
  Administered 2017-08-06: 50 ug via INTRAVENOUS

## 2017-08-06 MED ORDER — HEPARIN SODIUM (PORCINE) 1000 UNIT/ML IJ SOLN
INTRAMUSCULAR | Status: DC | PRN
  Administered 2017-08-06: 7000 [IU] via INTRAVENOUS
  Administered 2017-08-06: 2000 [IU] via INTRAVENOUS

## 2017-08-06 MED ORDER — IODIXANOL 320 MG/ML IV SOLN
INTRAVENOUS | Status: DC | PRN
  Administered 2017-08-06: 100 mL via INTRA_ARTERIAL

## 2017-08-06 MED ORDER — SODIUM CHLORIDE 0.9 % WEIGHT BASED INFUSION: 1.0000 mL/kg/h | INTRAVENOUS | Status: DC

## 2017-08-06 MED ORDER — MIDAZOLAM HCL 2 MG/2ML IJ SOLN
INTRAMUSCULAR | Status: AC
  Filled 2017-08-06: qty 2

## 2017-08-06 MED ORDER — ACETAMINOPHEN 325 MG PO TABS: 650.0000 mg | ORAL_TABLET | ORAL | Status: DC | PRN

## 2017-08-06 MED ORDER — SODIUM CHLORIDE 0.9 % IV SOLN
INTRAVENOUS | Status: DC
  Administered 2017-08-06: 08:00:00 via INTRAVENOUS

## 2017-08-06 MED ORDER — SODIUM CHLORIDE 0.9 % IV SOLN: 250.0000 mL | INTRAVENOUS | Status: DC | PRN

## 2017-08-06 MED ORDER — MIDAZOLAM HCL 2 MG/2ML IJ SOLN
INTRAMUSCULAR | Status: DC | PRN
  Administered 2017-08-06: 1 mg via INTRAVENOUS

## 2017-08-06 SURGICAL SUPPLY — 19 items
CATH ANGIO 5F PIGTAIL 65CM (CATHETERS) ×1 IMPLANT
CATH STRAIGHT 5FR 65CM (CATHETERS) ×2 IMPLANT
COVER PRB 48X5XTLSCP FOLD TPE (BAG) IMPLANT
COVER PROBE 5X48 (BAG) ×3
DEVICE CONTINUOUS FLUSH (MISCELLANEOUS) ×1 IMPLANT
GLIDESHEATH SLEND SS 6F .021 (SHEATH) IMPLANT
KIT ENCORE 26 ADVANTAGE (KITS) ×1 IMPLANT
KIT MICROPUNCTURE NIT STIFF (SHEATH) ×1 IMPLANT
KIT PV (KITS) ×3 IMPLANT
SHEATH AVANTI 11CM 5FR (SHEATH) ×1 IMPLANT
SHEATH AVANTI 11CM 7FR (SHEATH) ×1 IMPLANT
SHEATH FLEXOR ANSEL 1 7F 45CM (SHEATH) ×1 IMPLANT
STENT VIABAHN VBX 7X19X80 (Permanent Stent) ×1 IMPLANT
STOPCOCK MORSE 400PSI 3WAY (MISCELLANEOUS) ×2 IMPLANT
SYR MEDRAD MARK V 150ML (SYRINGE) ×3 IMPLANT
TRANSDUCER W/STOPCOCK (MISCELLANEOUS) ×3 IMPLANT
TRAY PV CATH (CUSTOM PROCEDURE TRAY) ×3 IMPLANT
TUBING HIGH PRESSURE 120CM (CONNECTOR) ×1 IMPLANT
WIRE BENTSON .035X145CM (WIRE) ×1 IMPLANT

## 2017-08-06 NOTE — Discharge Instructions (Signed)

## 2017-08-06 NOTE — Interval H&P Note (Signed)
   History and Physical Update  The patient was interviewed and re-examined.  The patient's previous History and Physical has been reviewed and is unchanged from my consult.  There is no change in the plan of care: left brachial artery cannulation, arch aortogram, and right subclavian artery stenting.   I discussed with the patient the nature of angiographic procedures, especially the limited patencies of any endovascular intervention.    The patient is aware of that the risks of an angiographic procedure include but are not limited to: bleeding, infection, access site complications, renal failure, embolization, rupture of vessel, dissection, arteriovenous fistula, possible need for emergent surgical intervention, possible need for surgical procedures to treat the patient's pathology, anaphylactic reaction to contrast, and stroke and death.    The patient is aware of the risks and agrees to proceed.   Adele Barthel, MD, FACS [facility] Office: [phone] Pager: [phone]  08/06/2017, 7:07 AM

## 2017-08-06 NOTE — Op Note (Signed)
OPERATIVE NOTE   PROCEDURE: 1.  left brachial artery cannulation under ultrasound guidance 2.  Placement of catheter in aorta 3.  Limited arch aortogram 4.  Left subclavian artery angiogram 5.  Angioplasty and stenting left subclavian artery (VBX 7 mm x 19 mm) 6.  Conscious sedation for 50 minutes 7.  Intra-arterial administration of nitroglycerin  PRE-OPERATIVE DIAGNOSIS: sub-total occlusion of left subclavian artery   POST-OPERATIVE DIAGNOSIS: same as above   SURGEON: Adele Barthel, MD  ANESTHESIA: conscious sedation  ESTIMATED BLOOD LOSS: 50 cc  CONTRAST: 100 cc  FINDING(S):  Sub-total occlusion of left subclavian artery: 4 Fr catheter obturated lumen, resolved with stenting and angioplasty  SPECIMEN(S):  none  INDICATIONS:   Misty Adams is a 62 y.o. 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  I discussed with the patient the nature of angiographic procedures, especially the limited patencies of any endovascular intervention.  The patient is aware of that the risks of an angiographic procedure include but are not limited to: bleeding, infection, access site complications, renal failure, embolization, rupture of vessel, dissection, possible need for emergent surgical intervention, possible need for surgical procedures to treat the patient's pathology, and stroke and death.  The patient is aware of the risks and agrees to proceed.  DESCRIPTION: After full informed consent was obtained from the patient, the patient was brought back to the angiography suite.  The patient was placed supine upon the angiography table and connected to cardiopulmonary monitoring equipment.  The  patient was then given conscious sedation, the amounts of which are documented in the patient's chart.  A circulating radiologic technician maintained continuous monitoring of the patient's cardiopulmonary status.  Additionally, the control room radiologic technician provided backup monitoring throughout the procedure.  The patient was prepped and drape in the standard fashion for an angiographic procedure.  At this point, attention was turned to the left antecubitum.  Under sonosite guidance, I cannulated the left brachial artery with a micropuncture needle.  I placed a microwire into the left brachial artery.  I then exchanged the needle for a microsheath.  I exchanged the wire for a Bentson wire and then exchanged the sheath for a 5-Fr sheath.  2000 units of Heparin and 200 mcg of nitroglycerin were given via the sheath to prevent spasm and thrombosis in the brachial artery.  I was able to cross into the aortic arch with the help of a 4-Fr straight catheter.  The catheter was exchanged for a straight catheter.  I did aortic arch power injection to determine the take off of the subclavian artery.  The subclavian artery was difficult to visualize, so I exchanged the pigtail catheter for the straight catheter which was pulled back into the proximal subclavian artery.  I did a hand injection to determine the position of the subclavian artery stenosis.  The catheter was essentially obturating the lumen of the subclavian artery, making determining the location difficult.  Based on the measurements, I felt a 7 mm VBX stent was going to be needed, so I exchanged the wire for [employer] wire.  The catheter was removed and then the sheath was exchanged for a 7-Fr Ansel sheath, which was placed in the mid-segment of the subclavian artery.  I gave the patient another 5000 units of Heparin.  I did another hand injection to determine the anatomy distal to the stenosis.  Based on the distance between the start of the lesion and  end of the lesion, I determined a 7 mm x 19 mm stent was needed.  I loaded the stent over the wire and positioned it 2-3 mm proximal to the start of the stenosis.  I deployed the stent at 10 atm for 1 minute.  There was significant waist in this stent initially, which resolved.  The balloon was removed and then I placed the straight catheter back into the proximal subclavian artery.  Hand injection demonstrated resolution of the sub-total occlusion and no obvious dissections.  The Bentson wire was replaced in the sheath and then the sheath was exchanged for a normal length 7-Fr sheath.  The sheath was aspirated.  No clots were present and the sheath was reloaded with heparinized saline.     COMPLICATIONS: none  CONDITION: stable   Adele Barthel, MD, [facility] Office: [phone] Pager: [phone]  08/06/2017, 9:51 AM

## 2017-08-06 NOTE — Progress Notes (Signed)
Site area: Left brachial 11f arterial Site Prior to Removal:  Level 0 Pressure Applied For: 30 min Manual:   yes Patient Status During Pull:  stable Post Pull Site:  Level 0--(bruise) Post Pull Instructions Given:  yes Post Pull Pulses Present: palpable Dressing Applied:  clear Bedrest begins @ 12 noon Comments:removed by Sherlyn Lick

## 2017-08-07 ENCOUNTER — Encounter (HOSPITAL_COMMUNITY): Payer: Self-pay | Admitting: Vascular Surgery

## 2017-08-07 MED FILL — Heparin Sod (Porcine)-NaCl IV Soln 1000 Unit/500ML-0.9%: INTRAVENOUS | Qty: 1000 | Status: AC

## 2017-08-10 ENCOUNTER — Telehealth: Payer: Self-pay | Admitting: Vascular Surgery

## 2017-08-10 NOTE — Telephone Encounter (Signed)
Sched appt 09/09/17 at 3:30. Spoke to pt to inform them of appt.

## 2017-08-10 NOTE — Telephone Encounter (Signed)
-----   Message from Mena Goes, RN sent at 08/06/2017  4:44 PM EDT ----- Regarding: 4 weeks   ----- Message ----- From: Conrad [place], MD Sent: 08/06/2017  10:03 AM To: [facility] [address]  Misty Adams [id number] Jul 01, 1955    PROCEDURE: 1.  left brachial artery cannulation under ultrasound guidance 2.  Placement of catheter in aorta 3.  Limited arch aortogram 4.  Left subclavian artery angiogram 5.  Angioplasty and stenting left subclavian artery (VBX 7 mm x 19 mm) 6.  Conscious sedation for 50 minutes 7.  Intra-arterial administration of nitroglycerin   Follow-up: 4 weeks

## 2017-09-09 ENCOUNTER — Encounter: Payer: BLUE CROSS/BLUE SHIELD | Admitting: Vascular Surgery

## 2017-09-09 NOTE — Progress Notes (Deleted)
Postoperative Visit (Angio)   History of Present Illness   Misty Adams is a 62 y.o. female who presents cc:  ***.  Prior procedure include: 1.  L brachial cann w/ [place] 2.  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  I felt L SCA could be preserved with stenting without risk to L VA.  The patient's current symptoms are: ***.  The patient is *** able to complete their activities of daily living.    Past Medical History, Past Surgical History, Social History, Family History, Medications, Allergies, and Review of Systems were reviewed on 09/09/17 and are unchanged from previous evaluation on 08/06/17.  Current Outpatient Medications  Medication Sig Dispense Refill  . acetaminophen (TYLENOL) 500 MG tablet Take 1,000 mg by mouth every 6 (six) hours as needed for moderate pain or headache.    . albuterol (PROVENTIL HFA;VENTOLIN HFA) 108 (90 Base) MCG/ACT inhaler Inhale 2 puffs into the lungs every 6 (six) hours as needed for wheezing or shortness of breath. 1 Inhaler 0  . ALPRAZolam (XANAX) 0.5 MG tablet Take 0.5 mg by mouth 2 (two) times daily as needed for anxiety.     Marland Kitchen aspirin 81 MG tablet Take 81 mg by mouth at bedtime.     Marland Kitchen atorvastatin (LIPITOR) 40 MG tablet Take 1 tablet (40 mg total) by mouth at bedtime. 30 tablet 0  . clopidogrel (PLAVIX) 75 MG tablet Take 1 tablet (75 mg total) by mouth daily. 30 tablet 0  . dicyclomine (BENTYL) 10 MG capsule Take 10 mg by mouth 3 (three) times daily as needed for spasms.     . furosemide (LASIX) 20 MG tablet TAKE 20 MG BY MOUTH ONCE DAILY    . hydrochlorothiazide (HYDRODIURIL) 25 MG tablet Take 25 mg by mouth 2 (two) times daily.     Marland Kitchen levothyroxine (SYNTHROID, LEVOTHROID) 125 MCG tablet Take 125 mcg by mouth daily.     . metFORMIN (GLUCOPHAGE-XR) 500 MG 24 hr tablet Take 1,000 mg by mouth daily with breakfast.    . ondansetron (ZOFRAN) 4 MG tablet Take 4 mg by mouth every 4 (four) hours as needed for nausea or vomiting.     . pantoprazole (PROTONIX) 40 MG tablet Take 40 mg by mouth daily.     . potassium chloride (K-DUR) 10 MEQ tablet Take 10 mEq by mouth 3 (three) times daily.     . sertraline (ZOLOFT) 100 MG tablet TAKE 100 MG BY MOUTH ONCE DAILY     No current facility-administered medications for this visit.     ROS: ***,***   For VQI Use Only   PRE-ADM LIVING: {VQI Pre-admission Living:20973}  AMB STATUS: {VQI Ambulatory Status:20974}   Physical Examination  ***There were no vitals filed for this visit. ***There is no height or weight on file to calculate BMI.  General {LOC:19197::"Somulent","Alert"}, {Orientation:19197::"Confused","O x 3"}, {Weight:19197::"Obese","Cachectic","WD"}, {General state of health:19197::"Ill appearing","NAD"}  Pulmonary {Chest wall:19197::"Asx chest movement","Sym exp"}, {Air movt:19197::"Decreased *** air movt","good B air movt"}, {BS:19197::"rales on ***","rhonchi on ***","wheezing on ***","CTA B"}  Cardiac {Rhythm:19197::"Irregularly, irregular rate and rhythm","RRR, Nl S1, S2"}, {Murmur:19197::"Murmur present: ***","no Murmurs"}, {Rubs:19197::"Rub present: ***","No rubs"}, {Gallop:19197::"Gallop present: ***","No S3,S4"}  Vascular Vessel Right Left  Radial {Palpable:19197::"Not palpable","Faintly palpable","Palpable"} {Palpable:19197::"Not palpable","Faintly palpable","Palpable"}  Brachial {Palpable:19197::"Not palpable","Faintly palpable","Palpable"} {Palpable:19197::"Not palpable","Faintly palpable","Palpable"}  Carotid Palpable, {Bruit:19197::"Bruit present","No Bruit"} Palpable, {Bruit:19197::"Bruit present","No Bruit"}  Aorta Not palpable N/A  Femoral {Palpable:19197::"Not palpable","Faintly palpable","Palpable"} {Palpable:19197::"Not palpable","Faintly palpable","Palpable"}  Popliteal {Palpable:19197::"Prominently palpable","Not palpable"} {Palpable:19197::"Prominently palpable","Not  palpable"}  PT {Palpable:19197::"Not palpable","Faintly palpable","Palpable"}  {Palpable:19197::"Not palpable","Faintly palpable","Palpable"}  DP {Palpable:19197::"Not palpable","Faintly palpable","Palpable"} {Palpable:19197::"Not palpable","Faintly palpable","Palpable"}    Gastrointestinal soft, {Distension:19197::"distended","non-distended"}, {TTP:19197::"TTP in *** quadrant","appropriate tenderness to palpation","non-tender to palpation"}, {Guarding:19197::"Guarding and rebound in *** quadrant","No guarding or rebound"}, {HSM:19197::"HSM present","no HSM"}, {Masses:19197::"Mass present: ***","no masses"}, {Flank:19197::"CVAT on ***","Flank bruit present on ***","no CVAT B"}, {AAA:19197::"Palpable prominent aortic pulse","No palpable prominent aortic pulse"}, {Surgical incision:19197::"Surg incisions: ***","Surgical incisions well healed"," "}  Musculoskeletal M/S 5/5 throughout {MS:19197::"except ***"," "}, Extremities without ischemic changes {MS:19197::"except ***"," "}, {Edema:19197::"Edema present: ***","No edema present"},  Neurologic  Pain and light touch intact in extremities{CN:19197::" except for decreased sensation in ***"," "}, Motor exam as listed above    Medical Decision Making   Misty Adams is a 62 y.o. female who presents s/p L SCA PTA+S.  Based on his angiographic findings, this patient needs: ***. I discussed in depth with the patient the nature of atherosclerosis, and emphasized the importance of maximal medical management including strict control of blood pressure, blood glucose, and lipid levels, obtaining regular exercise, and cessation of smoking.  The patient is aware that without maximal medical management the underlying atherosclerotic disease process will progress, limiting the benefit of any interventions. The patient is currently on a statin: Lipitor.  The patient is currently on an anti-platelet: ASA and Plavix.  Thank you for allowing [place] to participate in this patient's care.   Adele Barthel, MD, FACS [facility] Office: [phone] Pager: [phone]

## 2017-09-15 NOTE — Progress Notes (Signed)
Postoperative Visit (Angio)   History of Present Illness   Misty Adams is a 62 y.o. female who presents cc:  Leg swelling.  Prior procedure include: 1.  L SCA PTA+S (VBX 7 mm x 19 mm) on 08/06/17  The patient denies any L arm sx.  She has been checking her pulse and there have been no issues.  She does complain however of B leg swelling.  She increased discomfort with swelling as the day proceeds.  Past Medical History, Past Surgical History, Social History, Family History, Medications, Allergies, and Review of Systems were reviewed on 09/15/17 are unchanged from previous evaluation on 08/06/17.  Current Outpatient Medications  Medication Sig Dispense Refill  . acetaminophen (TYLENOL) 500 MG tablet Take 1,000 mg by mouth every 6 (six) hours as needed for moderate pain or headache.    . albuterol (PROVENTIL HFA;VENTOLIN HFA) 108 (90 Base) MCG/ACT inhaler Inhale 2 puffs into the lungs every 6 (six) hours as needed for wheezing or shortness of breath. 1 Inhaler 0  . ALPRAZolam (XANAX) 0.5 MG tablet Take 0.5 mg by mouth 2 (two) times daily as needed for anxiety.     Marland Kitchen aspirin 81 MG tablet Take 81 mg by mouth at bedtime.     Marland Kitchen atorvastatin (LIPITOR) 40 MG tablet Take 1 tablet (40 mg total) by mouth at bedtime. 30 tablet 0  . clopidogrel (PLAVIX) 75 MG tablet Take 1 tablet (75 mg total) by mouth daily. 30 tablet 0  . dicyclomine (BENTYL) 10 MG capsule Take 10 mg by mouth 3 (three) times daily as needed for spasms.     . furosemide (LASIX) 20 MG tablet TAKE 20 MG BY MOUTH ONCE DAILY    . hydrochlorothiazide (HYDRODIURIL) 25 MG tablet Take 25 mg by mouth 2 (two) times daily.     Marland Kitchen levothyroxine (SYNTHROID, LEVOTHROID) 125 MCG tablet Take 125 mcg by mouth daily.     . metFORMIN (GLUCOPHAGE-XR) 500 MG 24 hr tablet Take 1,000 mg by mouth daily with breakfast.    . ondansetron (ZOFRAN) 4 MG tablet Take 4 mg by mouth every 4 (four) hours as needed for nausea or vomiting.    . pantoprazole  (PROTONIX) 40 MG tablet Take 40 mg by mouth daily.     . potassium chloride (K-DUR) 10 MEQ tablet Take 10 mEq by mouth 3 (three) times daily.     . sertraline (ZOLOFT) 100 MG tablet TAKE 100 MG BY MOUTH ONCE DAILY     No current facility-administered medications for this visit.    ROS: B leg swelling, no intermittent claudication or rest pain   For VQI Use Only   PRE-ADM LIVING: Home  AMB STATUS: Ambulatory   Physical Examination   Vitals:   09/16/17 1619  BP: 126/81  Pulse: 90  Resp: 18  Temp: (!) 97.1 F (36.2 C)  TempSrc: Oral  SpO2: 92%  Weight: 186 lb 14.4 oz (84.8 kg)  Height: 5\' 3"  (1.6 m)   Body mass index is 33.11 kg/m.  General Alert, O x 3, WD, NAD  Pulmonary Sym exp, good B air movt, CTA B  Cardiac RRR, Nl S1, S2, no Murmurs, No rubs, No S3,S4  Vascular Vessel Right Left  Radial Palpable Palpable  Brachial Palpable Palpable  Carotid Palpable, No Bruit Palpable, No Bruit  Aorta Not palpable N/A  Femoral Palpable Palpable  Popliteal Not palpable Not palpable  PT Palpable Palpable  DP Palpable Palpable    Gastrointestinal soft, non-distended, non-tender  to palpation, No guarding or rebound, no HSM, no masses, no CVAT B, No palpable prominent aortic pulse,    Musculoskeletal M/S 5/5 throughout  , Extremities without ischemic changes  , Edema present: B 1-2+, extensive varicosities and spider veins, R>L LDS  Neurologic  Pain and light touch intact in extremities , Motor exam as listed above    Medical Decision Making   Misty Adams is a 62 y.o. female who presents s/p L SCA PTA+S for sub-total occlusion, chronic venous insufficiency (C4)  Based on his angiographic findings, this patient needs: q6 Month L carotid duplex to evaluate L SCA Additionally we discussed getting BLE venous reflux duplex to evaluate her venous anatomy as she is now having sx CVI.  She will come back in 4 weeks with those studies. I discussed in depth with the patient the  nature of atherosclerosis, and emphasized the importance of maximal medical management including strict control of blood pressure, blood glucose, and lipid levels, obtaining regular exercise, and cessation of smoking.  The patient is aware that without maximal medical management the underlying atherosclerotic disease process will progress, limiting the benefit of any interventions. The patient is currently on a statin: Lipitor.  The patient is currently on an anti-platelet: ASA and Plavix.  Thank you for allowing [place] to participate in this patient's care.   Adele Barthel, MD, FACS [facility] [place] Office: [phone] Pager: [phone]

## 2017-09-16 ENCOUNTER — Ambulatory Visit (INDEPENDENT_AMBULATORY_CARE_PROVIDER_SITE_OTHER): Payer: BLUE CROSS/BLUE SHIELD | Admitting: Vascular Surgery

## 2017-09-16 ENCOUNTER — Other Ambulatory Visit: Payer: Self-pay

## 2017-09-16 ENCOUNTER — Encounter: Payer: Self-pay | Admitting: Vascular Surgery

## 2017-09-16 VITALS — BP 126/81 | HR 90 | Temp 97.1°F | Resp 18 | Ht 63.0 in | Wt 186.9 lb

## 2017-09-16 DIAGNOSIS — I771 Stricture of artery: Secondary | ICD-10-CM

## 2017-09-16 DIAGNOSIS — I872 Venous insufficiency (chronic) (peripheral): Secondary | ICD-10-CM

## 2017-10-26 ENCOUNTER — Other Ambulatory Visit: Payer: Self-pay

## 2017-10-26 DIAGNOSIS — I872 Venous insufficiency (chronic) (peripheral): Secondary | ICD-10-CM

## 2017-10-29 ENCOUNTER — Ambulatory Visit: Payer: BLUE CROSS/BLUE SHIELD | Admitting: Vascular Surgery

## 2017-11-11 ENCOUNTER — Ambulatory Visit (HOSPITAL_COMMUNITY): Payer: BLUE CROSS/BLUE SHIELD

## 2017-11-11 ENCOUNTER — Ambulatory Visit: Payer: BLUE CROSS/BLUE SHIELD | Admitting: Vascular Surgery

## 2017-11-16 DIAGNOSIS — D369 Benign neoplasm, unspecified site: Secondary | ICD-10-CM | POA: Insufficient documentation

## 2017-12-02 ENCOUNTER — Encounter (HOSPITAL_COMMUNITY): Payer: BLUE CROSS/BLUE SHIELD

## 2017-12-09 ENCOUNTER — Encounter: Payer: Self-pay | Admitting: Vascular Surgery

## 2017-12-09 ENCOUNTER — Ambulatory Visit: Payer: BLUE CROSS/BLUE SHIELD | Admitting: Vascular Surgery

## 2017-12-09 ENCOUNTER — Inpatient Hospital Stay (HOSPITAL_COMMUNITY): Admission: RE | Admit: 2017-12-09 | Payer: BLUE CROSS/BLUE SHIELD | Source: Ambulatory Visit

## 2018-01-04 ENCOUNTER — Other Ambulatory Visit: Payer: Self-pay | Admitting: Internal Medicine

## 2018-01-04 DIAGNOSIS — Z1231 Encounter for screening mammogram for malignant neoplasm of breast: Secondary | ICD-10-CM

## 2018-01-22 ENCOUNTER — Ambulatory Visit: Payer: BLUE CROSS/BLUE SHIELD

## 2018-01-22 DIAGNOSIS — E538 Deficiency of other specified B group vitamins: Secondary | ICD-10-CM | POA: Insufficient documentation

## 2018-02-24 ENCOUNTER — Ambulatory Visit: Payer: BLUE CROSS/BLUE SHIELD | Admitting: Vascular Surgery

## 2018-09-08 DIAGNOSIS — Z8619 Personal history of other infectious and parasitic diseases: Secondary | ICD-10-CM | POA: Insufficient documentation

## 2018-09-08 DIAGNOSIS — B0223 Postherpetic polyneuropathy: Secondary | ICD-10-CM | POA: Diagnosis not present

## 2018-09-08 DIAGNOSIS — E1151 Type 2 diabetes mellitus with diabetic peripheral angiopathy without gangrene: Secondary | ICD-10-CM | POA: Diagnosis not present

## 2018-09-08 DIAGNOSIS — E119 Type 2 diabetes mellitus without complications: Secondary | ICD-10-CM | POA: Diagnosis not present

## 2018-10-12 DIAGNOSIS — E1151 Type 2 diabetes mellitus with diabetic peripheral angiopathy without gangrene: Secondary | ICD-10-CM | POA: Diagnosis not present

## 2018-10-19 DIAGNOSIS — Z0000 Encounter for general adult medical examination without abnormal findings: Secondary | ICD-10-CM | POA: Insufficient documentation

## 2018-10-19 DIAGNOSIS — E039 Hypothyroidism, unspecified: Secondary | ICD-10-CM | POA: Diagnosis not present

## 2018-10-19 DIAGNOSIS — R7989 Other specified abnormal findings of blood chemistry: Secondary | ICD-10-CM | POA: Diagnosis not present

## 2018-10-19 DIAGNOSIS — I739 Peripheral vascular disease, unspecified: Secondary | ICD-10-CM | POA: Diagnosis not present

## 2018-10-19 DIAGNOSIS — E1151 Type 2 diabetes mellitus with diabetic peripheral angiopathy without gangrene: Secondary | ICD-10-CM | POA: Diagnosis not present

## 2018-10-19 DIAGNOSIS — E538 Deficiency of other specified B group vitamins: Secondary | ICD-10-CM | POA: Diagnosis not present

## 2018-10-19 DIAGNOSIS — J449 Chronic obstructive pulmonary disease, unspecified: Secondary | ICD-10-CM | POA: Diagnosis not present

## 2018-12-06 DIAGNOSIS — Z85828 Personal history of other malignant neoplasm of skin: Secondary | ICD-10-CM | POA: Diagnosis not present

## 2018-12-06 DIAGNOSIS — L578 Other skin changes due to chronic exposure to nonionizing radiation: Secondary | ICD-10-CM | POA: Diagnosis not present

## 2018-12-06 DIAGNOSIS — B078 Other viral warts: Secondary | ICD-10-CM | POA: Diagnosis not present

## 2018-12-06 DIAGNOSIS — D485 Neoplasm of uncertain behavior of skin: Secondary | ICD-10-CM | POA: Diagnosis not present

## 2018-12-23 DIAGNOSIS — Z20828 Contact with and (suspected) exposure to other viral communicable diseases: Secondary | ICD-10-CM | POA: Diagnosis not present

## 2019-01-28 DIAGNOSIS — E1151 Type 2 diabetes mellitus with diabetic peripheral angiopathy without gangrene: Secondary | ICD-10-CM | POA: Diagnosis not present

## 2019-01-28 DIAGNOSIS — R7989 Other specified abnormal findings of blood chemistry: Secondary | ICD-10-CM | POA: Diagnosis not present

## 2019-01-28 DIAGNOSIS — E538 Deficiency of other specified B group vitamins: Secondary | ICD-10-CM | POA: Diagnosis not present

## 2019-01-28 DIAGNOSIS — E559 Vitamin D deficiency, unspecified: Secondary | ICD-10-CM | POA: Diagnosis not present

## 2019-01-28 DIAGNOSIS — E039 Hypothyroidism, unspecified: Secondary | ICD-10-CM | POA: Diagnosis not present

## 2019-02-04 ENCOUNTER — Other Ambulatory Visit: Payer: Self-pay

## 2019-02-04 DIAGNOSIS — Z7984 Long term (current) use of oral hypoglycemic drugs: Secondary | ICD-10-CM | POA: Diagnosis not present

## 2019-02-04 DIAGNOSIS — Z20822 Contact with and (suspected) exposure to covid-19: Secondary | ICD-10-CM

## 2019-02-04 DIAGNOSIS — Z23 Encounter for immunization: Secondary | ICD-10-CM | POA: Diagnosis not present

## 2019-02-04 DIAGNOSIS — E559 Vitamin D deficiency, unspecified: Secondary | ICD-10-CM | POA: Diagnosis not present

## 2019-02-04 DIAGNOSIS — F419 Anxiety disorder, unspecified: Secondary | ICD-10-CM | POA: Diagnosis not present

## 2019-02-04 DIAGNOSIS — Z79890 Hormone replacement therapy (postmenopausal): Secondary | ICD-10-CM | POA: Diagnosis not present

## 2019-02-04 DIAGNOSIS — E039 Hypothyroidism, unspecified: Secondary | ICD-10-CM | POA: Diagnosis not present

## 2019-02-04 DIAGNOSIS — E785 Hyperlipidemia, unspecified: Secondary | ICD-10-CM | POA: Diagnosis not present

## 2019-02-04 DIAGNOSIS — E1151 Type 2 diabetes mellitus with diabetic peripheral angiopathy without gangrene: Secondary | ICD-10-CM | POA: Diagnosis not present

## 2019-02-04 DIAGNOSIS — Z0000 Encounter for general adult medical examination without abnormal findings: Secondary | ICD-10-CM | POA: Diagnosis not present

## 2019-02-06 LAB — NOVEL CORONAVIRUS, NAA: SARS-CoV-2, NAA: NOT DETECTED

## 2019-02-07 DIAGNOSIS — Z20828 Contact with and (suspected) exposure to other viral communicable diseases: Secondary | ICD-10-CM | POA: Diagnosis not present

## 2019-06-13 ENCOUNTER — Other Ambulatory Visit: Payer: Self-pay

## 2019-06-13 ENCOUNTER — Ambulatory Visit: Payer: Medicare HMO | Attending: Internal Medicine

## 2019-06-13 DIAGNOSIS — E1151 Type 2 diabetes mellitus with diabetic peripheral angiopathy without gangrene: Secondary | ICD-10-CM | POA: Diagnosis not present

## 2019-06-13 DIAGNOSIS — Z23 Encounter for immunization: Secondary | ICD-10-CM | POA: Insufficient documentation

## 2019-06-13 DIAGNOSIS — E538 Deficiency of other specified B group vitamins: Secondary | ICD-10-CM | POA: Diagnosis not present

## 2019-06-13 NOTE — Progress Notes (Signed)
   Covid-19 Vaccination Clinic  Name:  SMT. DOBBERTIN    MRN: [id number] DOB: [birth year]  06/13/2019  Ms. Follin was observed post Covid-19 immunization for 15 minutes without incidence. She was provided with Vaccine Information Sheet and instruction to access the V-Safe system.   Ms. Runkle was instructed to call 911 with any severe reactions post vaccine: Marland Kitchen Difficulty breathing  . Swelling of your face and throat  . A fast heartbeat  . A bad rash all over your body  . Dizziness and weakness    Immunizations Administered    Name Date Dose VIS Date Route   Pfizer COVID-19 Vaccine 06/13/2019 10:34 AM 0.3 mL 04/01/2019 Intramuscular   Manufacturer: [place]   Lot: [id number]   [place]: [id number]

## 2019-06-20 DIAGNOSIS — Z79899 Other long term (current) drug therapy: Secondary | ICD-10-CM | POA: Diagnosis not present

## 2019-06-20 DIAGNOSIS — F419 Anxiety disorder, unspecified: Secondary | ICD-10-CM | POA: Diagnosis not present

## 2019-06-20 DIAGNOSIS — J449 Chronic obstructive pulmonary disease, unspecified: Secondary | ICD-10-CM | POA: Diagnosis not present

## 2019-06-20 DIAGNOSIS — Z0000 Encounter for general adult medical examination without abnormal findings: Secondary | ICD-10-CM | POA: Diagnosis not present

## 2019-06-20 DIAGNOSIS — R110 Nausea: Secondary | ICD-10-CM | POA: Diagnosis not present

## 2019-06-20 DIAGNOSIS — F17210 Nicotine dependence, cigarettes, uncomplicated: Secondary | ICD-10-CM | POA: Diagnosis not present

## 2019-06-20 DIAGNOSIS — E1151 Type 2 diabetes mellitus with diabetic peripheral angiopathy without gangrene: Secondary | ICD-10-CM | POA: Diagnosis not present

## 2019-06-27 ENCOUNTER — Other Ambulatory Visit: Payer: Self-pay | Admitting: Internal Medicine

## 2019-06-27 DIAGNOSIS — Z1231 Encounter for screening mammogram for malignant neoplasm of breast: Secondary | ICD-10-CM

## 2019-07-05 ENCOUNTER — Ambulatory Visit: Payer: Medicare HMO | Attending: Internal Medicine

## 2019-07-05 DIAGNOSIS — Z23 Encounter for immunization: Secondary | ICD-10-CM

## 2019-07-05 NOTE — Progress Notes (Signed)
   Covid-19 Vaccination Clinic  Name:  Misty Adams    MRN: [id number] DOB: [birth year]  07/05/2019  [name] was observed post Covid-19 immunization for 15 minutes without incident. She was provided with Vaccine Information Sheet and instruction to access the V-Safe system.   [name] was instructed to call 911 with any severe reactions post vaccine: Marland Kitchen Difficulty breathing  . Swelling of face and throat  . A fast heartbeat  . A bad rash all over body  . Dizziness and weakness   Immunizations Administered    Name Date Dose VIS Date Route   Pfizer COVID-19 Vaccine 07/05/2019  9:42 AM 0.3 mL 04/01/2019 Intramuscular   Manufacturer: [place]   Lot: [id number]   [place]: [id number]

## 2019-10-13 DIAGNOSIS — E1151 Type 2 diabetes mellitus with diabetic peripheral angiopathy without gangrene: Secondary | ICD-10-CM | POA: Diagnosis not present

## 2019-10-13 DIAGNOSIS — I739 Peripheral vascular disease, unspecified: Secondary | ICD-10-CM | POA: Diagnosis not present

## 2019-10-13 DIAGNOSIS — I771 Stricture of artery: Secondary | ICD-10-CM | POA: Diagnosis not present

## 2019-10-13 DIAGNOSIS — J449 Chronic obstructive pulmonary disease, unspecified: Secondary | ICD-10-CM | POA: Diagnosis not present

## 2019-10-27 DIAGNOSIS — R600 Localized edema: Secondary | ICD-10-CM | POA: Diagnosis not present

## 2019-10-27 DIAGNOSIS — I2510 Atherosclerotic heart disease of native coronary artery without angina pectoris: Secondary | ICD-10-CM | POA: Diagnosis not present

## 2019-10-27 DIAGNOSIS — F17210 Nicotine dependence, cigarettes, uncomplicated: Secondary | ICD-10-CM | POA: Diagnosis not present

## 2019-10-27 DIAGNOSIS — E119 Type 2 diabetes mellitus without complications: Secondary | ICD-10-CM | POA: Diagnosis not present

## 2019-10-27 DIAGNOSIS — F329 Major depressive disorder, single episode, unspecified: Secondary | ICD-10-CM | POA: Diagnosis not present

## 2019-10-27 DIAGNOSIS — F419 Anxiety disorder, unspecified: Secondary | ICD-10-CM | POA: Diagnosis not present

## 2019-11-08 IMAGING — CT CT ANGIO CHEST-ABD-PELV FOR DISSECTION W/ AND WO/W CM
2 of 7 series · 13 of 46 positions shown, 15 images · IV contrast (APPLIED)
Comparison: CT abdomen pelvis 07/09/2017

CT chest, abdomen and pelvis 08/09/2013

CLINICAL DATA: Asymmetric upper extremity pulse.

EXAM:
CT ANGIOGRAPHY CHEST, ABDOMEN AND PELVIS
TECHNIQUE: Multidetector CT imaging through the chest, abdomen and pelvis was
performed using the standard protocol during bolus administration of
intravenous contrast. Multiplanar reconstructed images and MIPs were
obtained and reviewed to evaluate the vascular anatomy.
CONTRAST:  100mL LHK237-LBS IOPAMIDOL (LHK237-LBS) INJECTION 76%

[Series 6: axial arterial · axial · arterial · 0.76mm/px · z∈[-988,-451]mm · 10 of 207 slices shown, 12 images]
[im 14/207  soft-tissue]
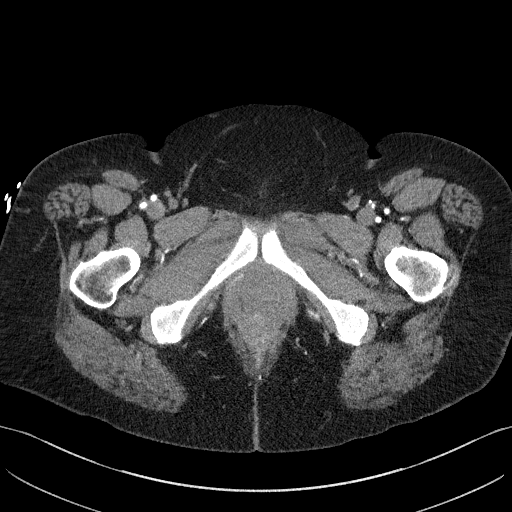
[im 14/207  bone]
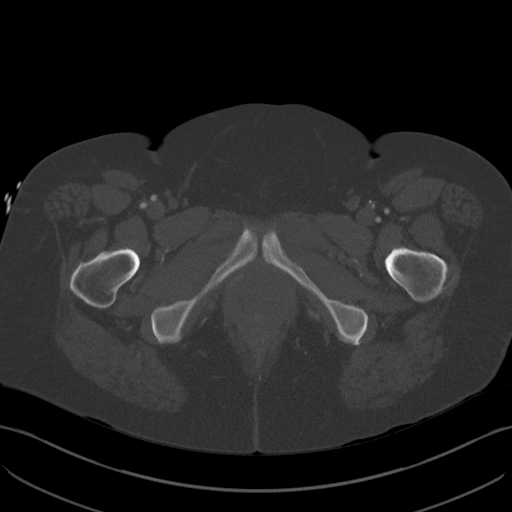
[im 42/207  soft-tissue]
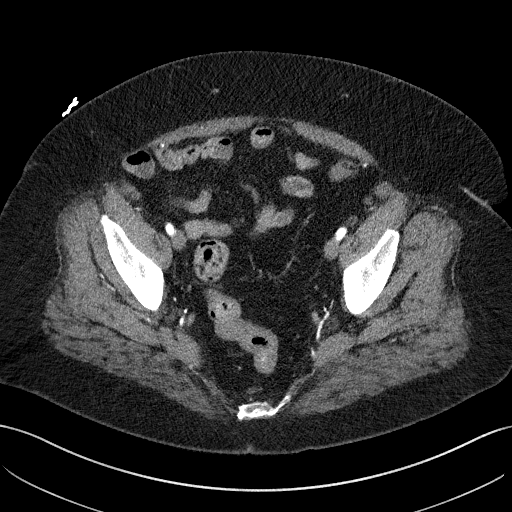
[im 55/207  soft-tissue]
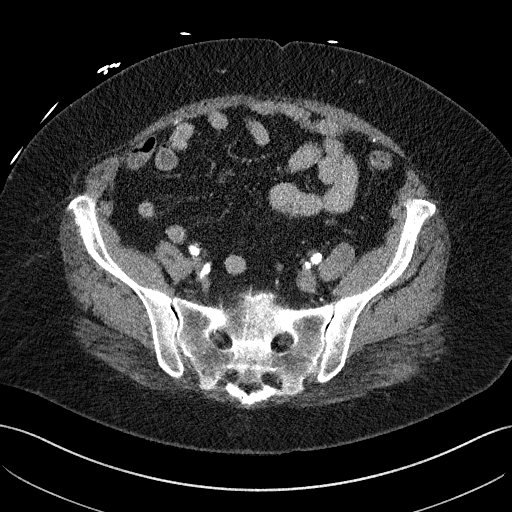
[im 69/207  soft-tissue]
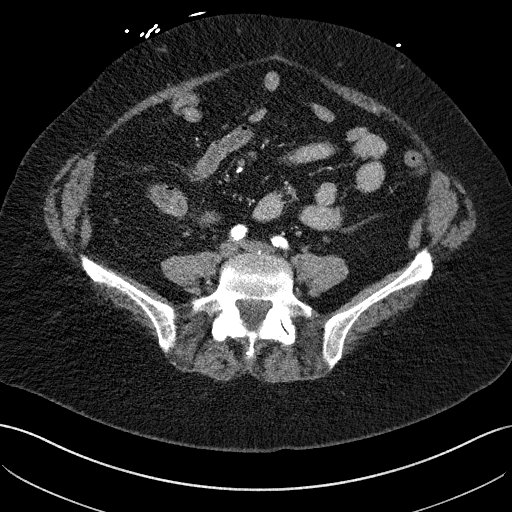
[im 97/207  soft-tissue]
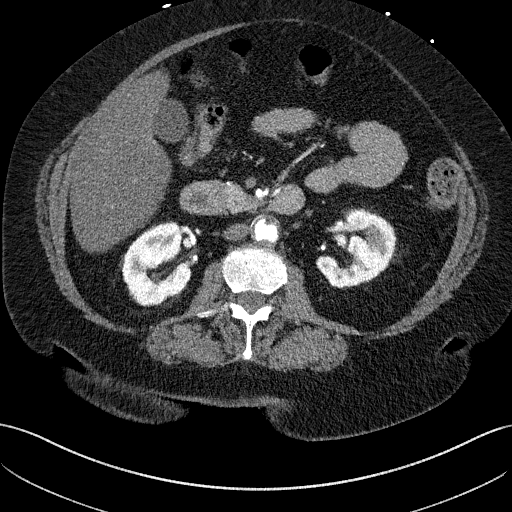
[im 110/207  soft-tissue]
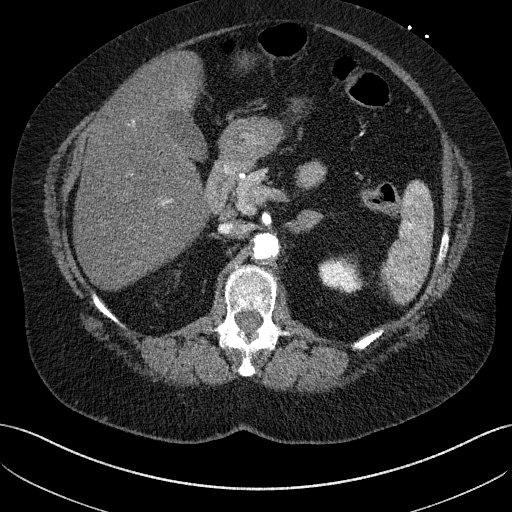
[im 138/207  soft-tissue]
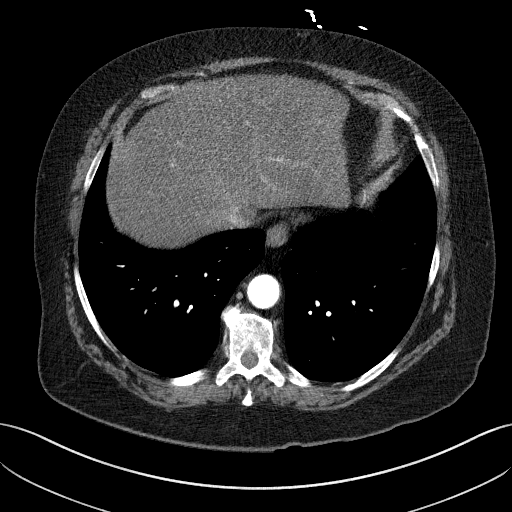
[im 152/207  soft-tissue]
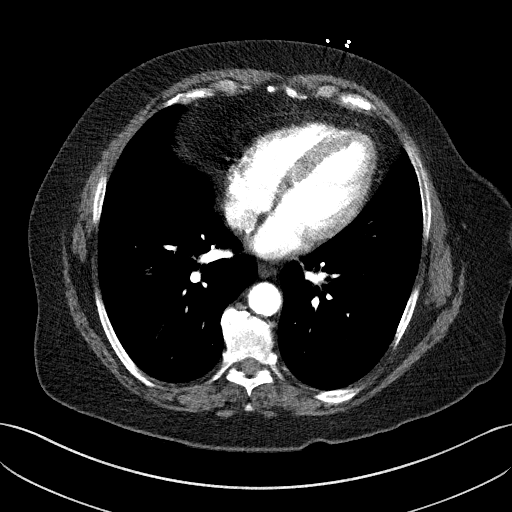
[im 165/207  soft-tissue]
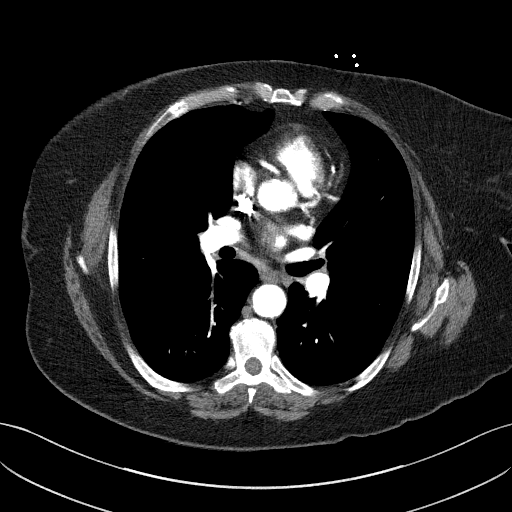
[im 165/207  bone]
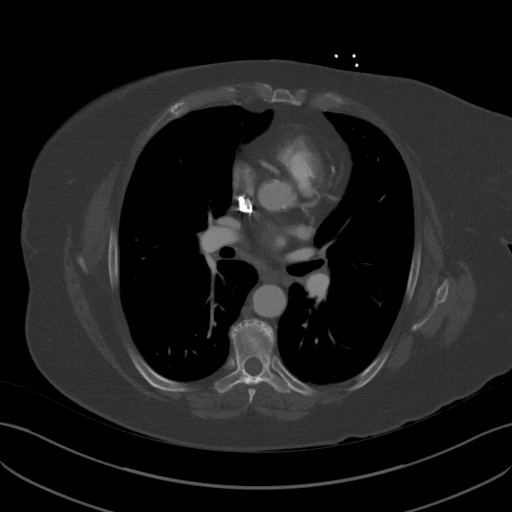
[im 193/207  soft-tissue]
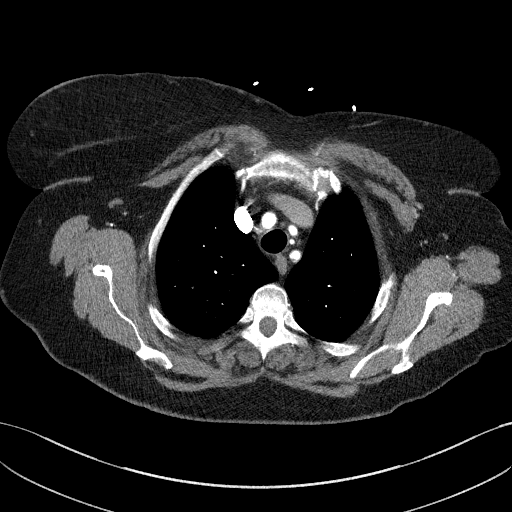

[Series 8: coronals · coronal · 0.75mm/px · 3 of 170 slices shown]
[im 43/170  soft-tissue]
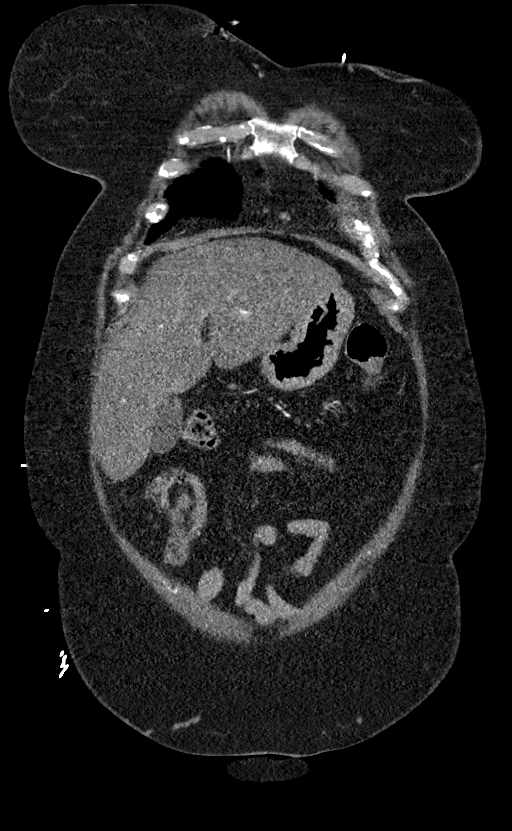
[im 85/170  soft-tissue]
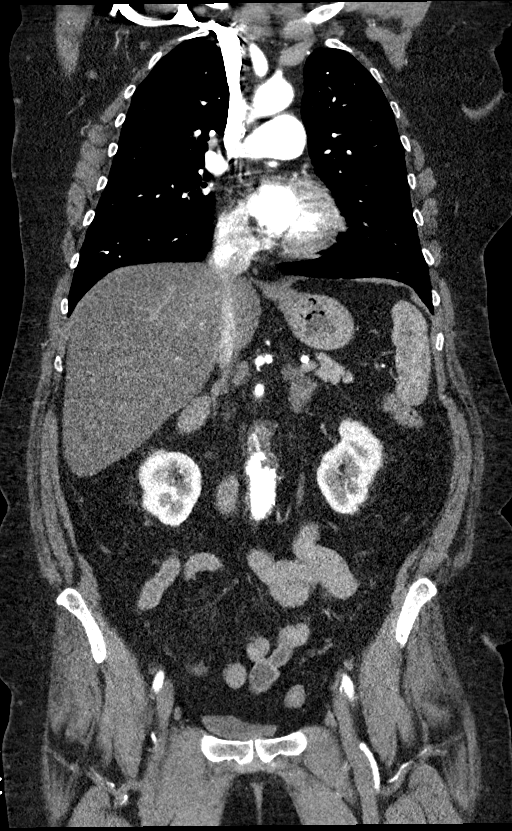
[im 127/170  soft-tissue]
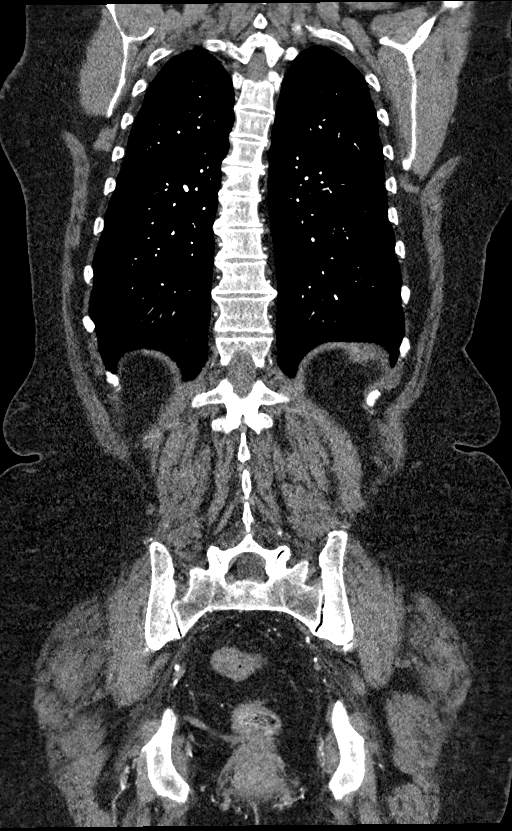

[13 of 46 positions shown; findings below may reference images not displayed]

FINDINGS: CTA CHEST FINDINGS

Cardiovascular: The heart size is normal. There is nopericardial
effusion.

The course and caliber of the thoracic aorta are normal. There is
intermediate aortic atherosclerotic calcification. Precontrast
images show no aortic intramural hematoma. There is no blood pool,
dissection or penetrating ulcer demonstrated on arterial phase
postcontrast imaging. Normal variant aortic arch branching pattern
with the left vertebral artery arising independently from the aortic
arch. There is approximately 90% stenosis of the proximal left
subclavian artery. Normal opacification of the remainder of the
visual lysed left subclavian artery.

The central pulmonary arteries are normal.

Mediastinum/Nodes: No mediastinal, hilar or axillary
lymphadenopathy. The visualized thyroid and thoracic esophageal
course are unremarkable.

Lungs/Pleura: Perifissural nodule along the right minor fissure
measures 9 mm.

Musculoskeletal: No chest wall abnormality. No acute osseous
findings.

Review of the MIP images confirms the above findings.

CTA ABDOMEN AND PELVIS FINDINGS

VASCULAR

Aorta: Normal caliber aorta without aneurysm, dissection, vasculitis
or hemodynamically significant stenosis. There is a large amount of
atherosclerotic plaque within the infrarenal abdominal aorta,
causing approximately 30% luminal narrowing.

Celiac: No aneurysm, dissection or hemodynamically significant
stenosis. Normal branching pattern.

SMA: Moderate narrowing at the origin of the SMA.

Renals: There are single renal arteries bilaterally. There is
moderate-to-severe stenosis of the origin of the left renal artery.
No aneurysm, dissection, or evidence of fibromuscular dysplasia.

IMA: Patent.

Inflow: Extensive atherosclerotic calcification of the bilateral
common, internal and external iliac arteries. The arteries remain
patent.

Veins: Normal course and caliber of the major veins. Assessment is
otherwise limited by the arterial dominant contrast phase.

Review of the MIP images confirms the above findings.

NON-VASCULAR

Hepatobiliary: Hypoattenuation of the liver meets the threshold for
hepatic steatosis. Normal gallbladder.

Pancreas: Normal contours without ductal dilatation. No
peripancreatic fluid collection.

Spleen: Normal arterial phase splenic enhancement pattern.

Adrenals/Urinary Tract:

--Adrenal glands: Left adrenal nodule measures 18 x 16 mm with
intermediate attenuation. This is unchanged and previously
identified as an adrenal adenoma. Right adrenal gland is normal.

--Right kidney/ureter: No hydronephrosis or perinephric stranding.
No nephrolithiasis. No obstructing ureteral stones.

--Left kidney/ureter: No hydronephrosis or perinephric stranding. No
nephrolithiasis. No obstructing ureteral stones.

--Urinary bladder: Unremarkable.

Stomach/Bowel:

--Stomach/Duodenum: No hiatal hernia or other gastric abnormality.
Normal duodenal course and caliber.

--Small bowel: No dilatation or inflammation.

--Colon: No focal abnormality.

--Appendix: Not visualized. No right lower quadrant inflammation or
free fluid.

Lymphatic:  No abdominal or pelvic lymphadenopathy.

Reproductive: Status post hysterectomy. No adnexal mass.

Musculoskeletal. No bony spinal canal stenosis or focal osseous
abnormality.

Other: None.

Review of the MIP images confirms the above findings.
IMPRESSION: 1. Severe stenosis of the proximal left subclavian artery, measuring
at least 90%. Distal left subclavian artery remains opacified. This
is in keeping with reported left upper extremity symptoms.
2. Extensive aortic atherosclerosis ([id number]), worst in the
infrarenal abdominal aorta, where there is approximately 30% luminal
narrowing due to predominantly noncalcified plaque.
3. Moderate origin stenosis of the superior mesenteric artery and
moderate-to-severe stenosis of the origin of the left renal artery.
4. Hepatic steatosis.

These results were called by telephone at the time of interpretation
on 07/13/2017 at [DATE] to Dr. TATYANNA SWAIN , who verbally
acknowledged these results.

## 2020-01-25 DIAGNOSIS — R509 Fever, unspecified: Secondary | ICD-10-CM | POA: Diagnosis not present

## 2020-01-25 DIAGNOSIS — Z03818 Encounter for observation for suspected exposure to other biological agents ruled out: Secondary | ICD-10-CM | POA: Diagnosis not present

## 2020-01-25 DIAGNOSIS — R112 Nausea with vomiting, unspecified: Secondary | ICD-10-CM | POA: Diagnosis not present

## 2020-01-27 ENCOUNTER — Other Ambulatory Visit (HOSPITAL_COMMUNITY): Payer: Self-pay | Admitting: Physician Assistant

## 2020-01-27 ENCOUNTER — Other Ambulatory Visit: Payer: Self-pay | Admitting: Physician Assistant

## 2020-01-27 DIAGNOSIS — R112 Nausea with vomiting, unspecified: Secondary | ICD-10-CM

## 2020-02-03 ENCOUNTER — Ambulatory Visit
Admission: RE | Admit: 2020-02-03 | Discharge: 2020-02-03 | Disposition: A | Discharge status: Home / Self Care | Payer: Medicare HMO | Source: Ambulatory Visit | Attending: Physician Assistant | Admitting: Physician Assistant

## 2020-02-03 ENCOUNTER — Other Ambulatory Visit: Payer: Self-pay

## 2020-02-03 DIAGNOSIS — R112 Nausea with vomiting, unspecified: Secondary | ICD-10-CM

## 2020-02-29 ENCOUNTER — Encounter: Payer: Self-pay | Admitting: Dermatology

## 2020-02-29 DIAGNOSIS — E538 Deficiency of other specified B group vitamins: Secondary | ICD-10-CM | POA: Diagnosis not present

## 2020-02-29 DIAGNOSIS — E1151 Type 2 diabetes mellitus with diabetic peripheral angiopathy without gangrene: Secondary | ICD-10-CM | POA: Diagnosis not present

## 2020-03-06 DIAGNOSIS — H524 Presbyopia: Secondary | ICD-10-CM | POA: Diagnosis not present

## 2020-03-07 ENCOUNTER — Other Ambulatory Visit: Payer: Self-pay | Admitting: Internal Medicine

## 2020-03-07 DIAGNOSIS — R634 Abnormal weight loss: Secondary | ICD-10-CM | POA: Diagnosis not present

## 2020-03-07 DIAGNOSIS — E119 Type 2 diabetes mellitus without complications: Secondary | ICD-10-CM | POA: Diagnosis not present

## 2020-03-07 DIAGNOSIS — J449 Chronic obstructive pulmonary disease, unspecified: Secondary | ICD-10-CM | POA: Diagnosis not present

## 2020-03-07 DIAGNOSIS — R1011 Right upper quadrant pain: Secondary | ICD-10-CM | POA: Diagnosis not present

## 2020-03-07 DIAGNOSIS — E1151 Type 2 diabetes mellitus with diabetic peripheral angiopathy without gangrene: Secondary | ICD-10-CM | POA: Diagnosis not present

## 2020-03-07 DIAGNOSIS — I700 Atherosclerosis of aorta: Secondary | ICD-10-CM | POA: Diagnosis not present

## 2020-03-07 DIAGNOSIS — Z0000 Encounter for general adult medical examination without abnormal findings: Secondary | ICD-10-CM | POA: Diagnosis not present

## 2020-03-07 DIAGNOSIS — Z23 Encounter for immunization: Secondary | ICD-10-CM | POA: Diagnosis not present

## 2020-03-07 DIAGNOSIS — Z720 Tobacco use: Secondary | ICD-10-CM | POA: Diagnosis not present

## 2020-03-09 ENCOUNTER — Other Ambulatory Visit: Payer: Self-pay

## 2020-03-09 ENCOUNTER — Ambulatory Visit
Admission: RE | Admit: 2020-03-09 | Discharge: 2020-03-09 | Disposition: A | Discharge status: Home / Self Care | Payer: Medicare HMO | Source: Ambulatory Visit | Attending: Internal Medicine | Admitting: Internal Medicine

## 2020-03-09 DIAGNOSIS — R112 Nausea with vomiting, unspecified: Secondary | ICD-10-CM | POA: Diagnosis not present

## 2020-03-09 DIAGNOSIS — R1011 Right upper quadrant pain: Secondary | ICD-10-CM | POA: Diagnosis not present

## 2020-03-09 DIAGNOSIS — R634 Abnormal weight loss: Secondary | ICD-10-CM

## 2020-03-09 DIAGNOSIS — D3500 Benign neoplasm of unspecified adrenal gland: Secondary | ICD-10-CM | POA: Diagnosis not present

## 2020-03-09 DIAGNOSIS — R509 Fever, unspecified: Secondary | ICD-10-CM | POA: Diagnosis not present

## 2020-03-09 HISTORY — DX: Heart failure, unspecified: I50.9

## 2020-03-09 MED ORDER — IOHEXOL 300 MG/ML  SOLN
80.0000 mL | Freq: Once | INTRAMUSCULAR | Status: AC | PRN
  Administered 2020-03-09: 80 mL via INTRAVENOUS

## 2020-03-09 MED ORDER — IOHEXOL 300 MG/ML  SOLN: 100.0000 mL | Freq: Once | INTRAMUSCULAR | Status: DC | PRN

## 2020-03-12 ENCOUNTER — Other Ambulatory Visit: Payer: Self-pay | Admitting: Internal Medicine

## 2020-03-12 DIAGNOSIS — Z1231 Encounter for screening mammogram for malignant neoplasm of breast: Secondary | ICD-10-CM

## 2020-03-13 DIAGNOSIS — I700 Atherosclerosis of aorta: Secondary | ICD-10-CM | POA: Insufficient documentation

## 2020-03-13 DIAGNOSIS — E119 Type 2 diabetes mellitus without complications: Secondary | ICD-10-CM | POA: Diagnosis not present

## 2020-03-13 DIAGNOSIS — M5414 Radiculopathy, thoracic region: Secondary | ICD-10-CM | POA: Diagnosis not present

## 2020-03-14 DIAGNOSIS — Z0100 Encounter for examination of eyes and vision without abnormal findings: Secondary | ICD-10-CM | POA: Diagnosis not present

## 2020-04-25 DIAGNOSIS — E119 Type 2 diabetes mellitus without complications: Secondary | ICD-10-CM | POA: Diagnosis not present

## 2020-05-08 ENCOUNTER — Other Ambulatory Visit: Payer: Self-pay | Admitting: Internal Medicine

## 2020-05-08 DIAGNOSIS — Z1231 Encounter for screening mammogram for malignant neoplasm of breast: Secondary | ICD-10-CM

## 2020-05-15 ENCOUNTER — Other Ambulatory Visit: Payer: Self-pay

## 2020-05-15 ENCOUNTER — Ambulatory Visit
Admission: RE | Admit: 2020-05-15 | Discharge: 2020-05-15 | Disposition: A | Discharge status: Home / Self Care | Payer: Medicare HMO | Source: Ambulatory Visit

## 2020-05-15 DIAGNOSIS — Z1231 Encounter for screening mammogram for malignant neoplasm of breast: Secondary | ICD-10-CM

## 2020-05-16 ENCOUNTER — Other Ambulatory Visit: Payer: Self-pay | Admitting: Internal Medicine

## 2020-05-16 DIAGNOSIS — R928 Other abnormal and inconclusive findings on diagnostic imaging of breast: Secondary | ICD-10-CM

## 2020-05-18 DIAGNOSIS — J439 Emphysema, unspecified: Secondary | ICD-10-CM | POA: Diagnosis not present

## 2020-05-18 DIAGNOSIS — J449 Chronic obstructive pulmonary disease, unspecified: Secondary | ICD-10-CM | POA: Diagnosis not present

## 2020-05-18 DIAGNOSIS — E1151 Type 2 diabetes mellitus with diabetic peripheral angiopathy without gangrene: Secondary | ICD-10-CM | POA: Diagnosis not present

## 2020-05-24 ENCOUNTER — Ambulatory Visit: Payer: Medicare HMO | Admitting: Dermatology

## 2020-05-29 ENCOUNTER — Ambulatory Visit
Admission: RE | Admit: 2020-05-29 | Discharge: 2020-05-29 | Disposition: A | Discharge status: Home / Self Care | Payer: Medicare HMO | Source: Ambulatory Visit | Attending: Internal Medicine | Admitting: Internal Medicine

## 2020-05-29 ENCOUNTER — Other Ambulatory Visit: Payer: Self-pay

## 2020-05-29 ENCOUNTER — Ambulatory Visit: Payer: Medicare HMO

## 2020-05-29 ENCOUNTER — Other Ambulatory Visit: Payer: Self-pay | Admitting: Internal Medicine

## 2020-05-29 DIAGNOSIS — R921 Mammographic calcification found on diagnostic imaging of breast: Secondary | ICD-10-CM | POA: Diagnosis not present

## 2020-05-29 DIAGNOSIS — R928 Other abnormal and inconclusive findings on diagnostic imaging of breast: Secondary | ICD-10-CM

## 2020-05-29 DIAGNOSIS — R922 Inconclusive mammogram: Secondary | ICD-10-CM | POA: Diagnosis not present

## 2020-06-28 DIAGNOSIS — E1151 Type 2 diabetes mellitus with diabetic peripheral angiopathy without gangrene: Secondary | ICD-10-CM | POA: Diagnosis not present

## 2020-07-05 DIAGNOSIS — Z0000 Encounter for general adult medical examination without abnormal findings: Secondary | ICD-10-CM | POA: Diagnosis not present

## 2020-07-05 DIAGNOSIS — E876 Hypokalemia: Secondary | ICD-10-CM | POA: Diagnosis not present

## 2020-07-05 DIAGNOSIS — E119 Type 2 diabetes mellitus without complications: Secondary | ICD-10-CM | POA: Diagnosis not present

## 2020-08-01 ENCOUNTER — Ambulatory Visit: Payer: Medicare HMO | Admitting: Dermatology

## 2020-08-10 DIAGNOSIS — E876 Hypokalemia: Secondary | ICD-10-CM | POA: Diagnosis not present

## 2020-09-19 ENCOUNTER — Other Ambulatory Visit: Payer: Self-pay

## 2020-09-19 ENCOUNTER — Emergency Department: Payer: Medicare HMO

## 2020-09-19 ENCOUNTER — Observation Stay
Admission: EM | Admit: 2020-09-19 | Discharge: 2020-09-21 | Disposition: A | Discharge status: Home / Self Care | Payer: Medicare HMO | Attending: Internal Medicine | Admitting: Internal Medicine

## 2020-09-19 DIAGNOSIS — R55 Syncope and collapse: Secondary | ICD-10-CM | POA: Diagnosis not present

## 2020-09-19 DIAGNOSIS — E119 Type 2 diabetes mellitus without complications: Secondary | ICD-10-CM | POA: Insufficient documentation

## 2020-09-19 DIAGNOSIS — Z79899 Other long term (current) drug therapy: Secondary | ICD-10-CM | POA: Insufficient documentation

## 2020-09-19 DIAGNOSIS — I110 Hypertensive heart disease with heart failure: Secondary | ICD-10-CM | POA: Insufficient documentation

## 2020-09-19 DIAGNOSIS — I509 Heart failure, unspecified: Secondary | ICD-10-CM | POA: Diagnosis not present

## 2020-09-19 DIAGNOSIS — E876 Hypokalemia: Secondary | ICD-10-CM | POA: Insufficient documentation

## 2020-09-19 DIAGNOSIS — Z789 Other specified health status: Secondary | ICD-10-CM

## 2020-09-19 DIAGNOSIS — F17210 Nicotine dependence, cigarettes, uncomplicated: Secondary | ICD-10-CM | POA: Diagnosis not present

## 2020-09-19 DIAGNOSIS — E039 Hypothyroidism, unspecified: Secondary | ICD-10-CM | POA: Diagnosis not present

## 2020-09-19 DIAGNOSIS — R059 Cough, unspecified: Secondary | ICD-10-CM | POA: Diagnosis not present

## 2020-09-19 DIAGNOSIS — R0902 Hypoxemia: Secondary | ICD-10-CM | POA: Diagnosis not present

## 2020-09-19 DIAGNOSIS — U071 COVID-19: Principal | ICD-10-CM | POA: Insufficient documentation

## 2020-09-19 DIAGNOSIS — Z7982 Long term (current) use of aspirin: Secondary | ICD-10-CM | POA: Insufficient documentation

## 2020-09-19 DIAGNOSIS — R531 Weakness: Secondary | ICD-10-CM

## 2020-09-19 DIAGNOSIS — R42 Dizziness and giddiness: Secondary | ICD-10-CM | POA: Diagnosis not present

## 2020-09-19 DIAGNOSIS — N179 Acute kidney failure, unspecified: Secondary | ICD-10-CM | POA: Insufficient documentation

## 2020-09-19 DIAGNOSIS — J1282 Pneumonia due to coronavirus disease 2019: Secondary | ICD-10-CM | POA: Insufficient documentation

## 2020-09-19 DIAGNOSIS — I959 Hypotension, unspecified: Secondary | ICD-10-CM | POA: Diagnosis not present

## 2020-09-19 DIAGNOSIS — Z7984 Long term (current) use of oral hypoglycemic drugs: Secondary | ICD-10-CM | POA: Insufficient documentation

## 2020-09-19 DIAGNOSIS — J189 Pneumonia, unspecified organism: Secondary | ICD-10-CM | POA: Diagnosis present

## 2020-09-19 DIAGNOSIS — R7989 Other specified abnormal findings of blood chemistry: Secondary | ICD-10-CM

## 2020-09-19 LAB — CBC WITH DIFFERENTIAL/PLATELET
Abs Immature Granulocytes: 0.03 10*3/uL (ref 0.00–0.07)
Basophils Absolute: 0 10*3/uL (ref 0.0–0.1)
Basophils Relative: 0 %
Eosinophils Absolute: 0 10*3/uL (ref 0.0–0.5)
Eosinophils Relative: 1 %
HCT: 38.6 % (ref 36.0–46.0)
Hemoglobin: 13.2 g/dL (ref 12.0–15.0)
Immature Granulocytes: 0 %
Lymphocytes Relative: 18 %
Lymphs Abs: 1.3 10*3/uL (ref 0.7–4.0)
MCH: 28.9 pg (ref 26.0–34.0)
MCHC: 34.2 g/dL (ref 30.0–36.0)
MCV: 84.6 fL (ref 80.0–100.0)
Monocytes Absolute: 0.5 10*3/uL (ref 0.1–1.0)
Monocytes Relative: 7 %
Neutro Abs: 5.2 10*3/uL (ref 1.7–7.7)
Neutrophils Relative %: 74 %
Platelets: 230 10*3/uL (ref 150–400)
RBC: 4.56 MIL/uL (ref 3.87–5.11)
RDW: 14.9 % (ref 11.5–15.5)
WBC: 7 10*3/uL (ref 4.0–10.5)
nRBC: 0 % (ref 0.0–0.2)

## 2020-09-19 MED ORDER — SODIUM CHLORIDE 0.9 % IV BOLUS
1000.0000 mL | Freq: Once | INTRAVENOUS | Status: AC
  Administered 2020-09-19: 1000 mL via INTRAVENOUS

## 2020-09-19 NOTE — ED Triage Notes (Signed)
Pt to ED via EMS for near syncope, reports tested positive for covid on Monday. Pt reports today she stood up and felt like she was going to pass out, called EMS. Per EMS, pt was orthostat+ with BP of 61/34. Pt given 465mL NS en route, BP 127/60 after. Pt reports main covid symptoms high fever and weakness. A&Ox4. Denies dizziness

## 2020-09-19 NOTE — ED Provider Notes (Signed)
[facility] Emergency Department Provider Note   ____________________________________________   Event Date/Time   First MD Initiated Contact with Patient 09/19/20 2329     (approximate)  I have reviewed the triage vital signs and the nursing notes.   HISTORY  Chief Complaint Near Syncope and Covid Positive    HPI Misty Adams is a 65 y.o. female brought to the ED from home via EMS with a chief complaint of generalized weakness and near syncope.  Patient with a history of CHF, diabetes, hypertension, hyperlipidemia, hypothyroidism.  Patient returned from a cruise and tested positive for COVID on Monday.  Reports decreased oral intake over the past several days.  Tonight got out of bed and had a near syncopal episode.  + Orthostatic per EMS.  Endorses dry cough.  Denies fever, chest pain, shortness of breath, abdominal pain, nausea, vomiting or diarrhea.  Patient is vaccinated and boosted against COVID-19.     Past Medical History:  Diagnosis Date  . CHF (congestive heart failure) ([place])   . Diabetes mellitus without complication ([place])   . Hyperlipidemia   . Hypertension   . Hypothyroidism   . Psoriasis   . Tobacco abuse     Patient Active Problem List   Diagnosis Date Noted  . Chronic venous insufficiency 09/16/2017  . Subclavian artery stenosis, left ([place]) 07/15/2017    Past Surgical History:  Procedure Laterality Date  . ABDOMINAL HYSTERECTOMY    . AORTIC ARCH ANGIOGRAPHY N/A 08/06/2017   Procedure: AORTIC ARCH ANGIOGRAPHY;  Surgeon: Conrad [place], MD;  Location: [place] [facility];  Service: Cardiovascular;  Laterality: N/A;  . APPENDECTOMY    . CESAREAN SECTION    . dilatation and curettage    . KNEE SURGERY    . left wrist ganglion cyst removal    . PERIPHERAL VASCULAR INTERVENTION Left 08/06/2017   Procedure: PERIPHERAL VASCULAR INTERVENTION;  Surgeon: Conrad [place], MD;  Location: [place] [facility];  Service: Cardiovascular;   Laterality: Left;  subclavian    Prior to Admission medications   Medication Sig Start Date End Date Taking? Authorizing Provider  acetaminophen (TYLENOL) 500 MG tablet Take 1,000 mg by mouth every 6 (six) hours as needed for moderate pain or headache.    [provider]  albuterol (PROVENTIL HFA;VENTOLIN HFA) 108 (90 Base) MCG/ACT inhaler Inhale 2 puffs into the lungs every 6 (six) hours as needed for wheezing or shortness of breath. 07/14/17   Darel Hong, MD  ALPRAZolam Duanne Moron) 0.5 MG tablet Take 0.5 mg by mouth 2 (two) times daily as needed for anxiety.  01/16/17   [provider]  aspirin 81 MG tablet Take 81 mg by mouth at bedtime.     [provider]  atorvastatin (LIPITOR) 40 MG tablet Take 1 tablet (40 mg total) by mouth at bedtime. 07/14/17 07/14/18  Darel Hong, MD  dicyclomine (BENTYL) 10 MG capsule Take 10 mg by mouth 3 (three) times daily as needed for spasms.  07/09/17 07/09/18  [provider]  furosemide (LASIX) 20 MG tablet TAKE 20 MG BY MOUTH ONCE DAILY 07/10/16   [provider]  glimepiride (AMARYL) 4 MG tablet 2 mg 2 (two) times daily. 08/14/17   [provider]  hydrochlorothiazide (HYDRODIURIL) 25 MG tablet Take 25 mg by mouth 2 (two) times daily.     [provider]  levothyroxine (SYNTHROID, LEVOTHROID) 125 MCG tablet Take 125 mcg by mouth daily.     [provider]  metFORMIN (GLUCOPHAGE-XR) 500 MG 24 hr tablet Take 1,000 mg by mouth daily with breakfast.    [provider]  ondansetron (ZOFRAN) 4 MG tablet Take 4 mg by mouth every 4 (four) hours as needed for nausea or vomiting.    [provider]  pantoprazole (PROTONIX) 40 MG tablet Take 40 mg by mouth daily.     [provider]  potassium chloride (K-DUR) 10 MEQ tablet Take 10 mEq by mouth 3 (three) times daily.  10/11/15   [provider]  sertraline (ZOLOFT) 100 MG tablet TAKE 100 MG BY MOUTH ONCE DAILY  07/10/16   [provider]  venlafaxine (EFFEXOR) 75 MG tablet Take 75 mg by mouth daily.    [provider]    Allergies Hydrocodone-acetaminophen and Vicodin [hydrocodone-acetaminophen]  Family History  Problem Relation Age of Onset  . Heart attack Father     Social History Social History   Tobacco Use  . Smoking status: Current Every Day Smoker    Packs/day: 2.00    Types: Cigarettes  . Smokeless tobacco: Never Used  Substance Use Topics  . Alcohol use: Yes    Review of Systems  Constitutional: No fever/chills Eyes: No visual changes. ENT: No sore throat. Cardiovascular: Denies chest pain. Respiratory: Positive for cough.  Denies shortness of breath. Gastrointestinal: No abdominal pain.  No nausea, no vomiting.  No diarrhea.  No constipation. Genitourinary: Negative for dysuria. Musculoskeletal: Negative for back pain. Skin: Negative for rash. Neurological: Positive for near syncope.  Negative for headaches, focal weakness or numbness.   ____________________________________________   PHYSICAL EXAM:  VITAL SIGNS: ED Triage Vitals  Enc Vitals Group     BP      Pulse      Resp      Temp      Temp src      SpO2      Weight      Height      Head Circumference      Peak Flow      Pain Score      Pain Loc      Pain Edu?      Excl. in [place]?     Constitutional: Alert and oriented.  Elderly appearing and in mild acute distress. Eyes: Conjunctivae are normal. PERRL. EOMI. Head: Atraumatic. Nose: No congestion/rhinnorhea. Mouth/Throat: Mucous membranes are mildly dry. Neck: No stridor.   Cardiovascular: Normal rate, regular rhythm. Grossly normal heart sounds.  Good peripheral circulation. Respiratory: Normal respiratory effort.  No retractions. Lungs CTAB.  Dry cough noted. Gastrointestinal: Soft and nontender to light or deep palpation. No distention. No abdominal bruits. No CVA tenderness. Musculoskeletal: No lower extremity tenderness  nor edema.  No joint effusions. Neurologic:  Normal speech and language. No gross focal neurologic deficits are appreciated.  Skin:  Skin is warm, dry and intact. No rash noted. Psychiatric: Mood and affect are normal. Speech and behavior are normal.  ____________________________________________   LABS (all labs ordered are listed, but only abnormal results are displayed)  Labs Reviewed  RESP PANEL BY RT-PCR (FLU A&B, COVID) ARPGX2 - Abnormal; Notable for the following components:      Result Value   SARS Coronavirus 2 by RT PCR POSITIVE (*)    All other components within normal limits  COMPREHENSIVE METABOLIC PANEL - Abnormal; Notable for the following components:   Sodium 133 (*)    Potassium 2.4 (*)    Chloride 92 (*)    BUN 48 (*)  Creatinine, Ser 1.70 (*)    Calcium 8.6 (*)    Albumin 3.3 (*)    GFR, Estimated 33 (*)    All other components within normal limits  URINALYSIS, COMPLETE (UACMP) WITH MICROSCOPIC - Abnormal; Notable for the following components:   Color, Urine YELLOW (*)    APPearance HAZY (*)    All other components within normal limits  CBC WITH DIFFERENTIAL/PLATELET  HIV ANTIBODY (ROUTINE TESTING W REFLEX)  BRAIN NATRIURETIC PEPTIDE  C-REACTIVE PROTEIN  D-DIMER, QUANTITATIVE  FERRITIN  FIBRINOGEN  HEPATITIS B SURFACE ANTIGEN  LACTATE DEHYDROGENASE  PROCALCITONIN  ABO/RH  TYPE AND SCREEN  TROPONIN I (HIGH SENSITIVITY)  TROPONIN I (HIGH SENSITIVITY)   ____________________________________________  EKG  ED ECG REPORT I, Joram Venson J, the attending physician, personally viewed and interpreted this ECG.   Date: 09/19/2020  EKG Time: 2338  Rate: 64  Rhythm: normal EKG, normal sinus rhythm  Axis: Normal  Intervals:none  ST&T Change: Nonspecific  ____________________________________________  RADIOLOGY I, Chaeli Judy J, personally viewed and evaluated these images (plain radiographs) as part of my medical decision making, as well as reviewing the  written report by the radiologist.  ED MD interpretation: Mild patchy airspace opacity  Official radiology report(s): DG Chest Port 1 View  Result Date: 09/19/2020 CLINICAL DATA:  COVID-19 positivity with cough, initial encounter EXAM: PORTABLE CHEST 1 VIEW COMPARISON:  07/13/2017 FINDINGS: Cardiac shadow is within normal limits. Aortic calcifications are noted. The lungs are well aerated bilaterally. Some very mild patchy airspace opacity is noted primarily within the right lung. No effusion is noted. No bony abnormality is seen. IMPRESSION: Mild patchy airspace opacity consistent with the given clinical history. No other focal abnormality is noted. Electronically Signed   By: Inez Catalina M.D.   On: 09/19/2020 23:51    ____________________________________________   PROCEDURES  Procedure(s) performed (including Critical Care):  .1-3 Lead EKG Interpretation Performed by: Paulette Blanch, MD Authorized by: Paulette Blanch, MD     Interpretation: normal     ECG rate:  66   ECG rate assessment: normal     Rhythm: sinus rhythm     Ectopy: none     Conduction: normal   Comments:     Patient placed on cardiac monitor to evaluate for arrhythmias     ____________________________________________   INITIAL IMPRESSION / ASSESSMENT AND PLAN / ED COURSE  As part of my medical decision making, I reviewed the following data within the electronic MEDICAL RECORD NUMBER History obtained from family, Nursing notes reviewed and incorporated, Labs reviewed, EKG interpreted, Old chart reviewed, Radiograph reviewed and Notes from prior ED visits     65 year old female presenting with generalized weakness; positive at home COVID antigen test on Monday.  Differential diagnosis includes but is not limited to COVID-19, pneumonia, UTI, dehydration, ACS, metabolic etiology, etc.  Will obtain cardiac work-up, chest x-ray, UA, respiratory panel.  Initiate IV fluid resuscitation.  Will reassess.  Clinical Course as  of 09/20/20 0144  Wed Sep 19, 2020  2346 Potassium noted; will replete both orally and via IV.  Awaiting results of respiratory panel. [JS]  Thu Sep 20, 2020  0143 Updated patient on all test results. IV Remdisivir and Solumedrol ordered. Will discuss with hospitalist services for admission. [JS]    Clinical Course User Index [JS] Paulette Blanch, MD     ____________________________________________   FINAL CLINICAL IMPRESSION(S) / ED DIAGNOSES  Final diagnoses:  Near syncope  Weakness  Hypokalemia  AKI (acute kidney injury) (  [place])  Pneumonia due to COVID-19 virus     ED Discharge Orders    None       Note:  This document was prepared using Dragon voice recognition software and may include unintentional dictation errors.   Paulette Blanch, MD 09/20/20 [phone]

## 2020-09-20 ENCOUNTER — Encounter: Payer: Self-pay | Admitting: Family Medicine

## 2020-09-20 ENCOUNTER — Inpatient Hospital Stay: Payer: Medicare HMO

## 2020-09-20 DIAGNOSIS — E876 Hypokalemia: Secondary | ICD-10-CM

## 2020-09-20 DIAGNOSIS — U071 COVID-19: Secondary | ICD-10-CM

## 2020-09-20 DIAGNOSIS — R55 Syncope and collapse: Secondary | ICD-10-CM | POA: Diagnosis not present

## 2020-09-20 DIAGNOSIS — R531 Weakness: Secondary | ICD-10-CM

## 2020-09-20 DIAGNOSIS — J1282 Pneumonia due to coronavirus disease 2019: Secondary | ICD-10-CM

## 2020-09-20 DIAGNOSIS — R0600 Dyspnea, unspecified: Secondary | ICD-10-CM | POA: Diagnosis not present

## 2020-09-20 DIAGNOSIS — N179 Acute kidney failure, unspecified: Secondary | ICD-10-CM

## 2020-09-20 LAB — HEPATIC FUNCTION PANEL
ALT: 15 U/L (ref 0–44)
AST: 18 U/L (ref 15–41)
Albumin: 3.1 g/dL — ABNORMAL LOW (ref 3.5–5.0)
Alkaline Phosphatase: 49 U/L (ref 38–126)
Bilirubin, Direct: <0.1 mg/dL (ref 0.0–0.2)
Total Bilirubin: 0.5 mg/dL (ref 0.3–1.2)
Total Protein: 6.5 g/dL (ref 6.5–8.1)

## 2020-09-20 LAB — URINALYSIS, COMPLETE (UACMP) WITH MICROSCOPIC
Bacteria, UA: NONE SEEN
Bilirubin Urine: NEGATIVE
Glucose, UA: NEGATIVE mg/dL
Hgb urine dipstick: NEGATIVE
Ketones, ur: NEGATIVE mg/dL
Leukocytes,Ua: NEGATIVE
Nitrite: NEGATIVE
Protein, ur: NEGATIVE mg/dL
Specific Gravity, Urine: 1.01 (ref 1.005–1.030)
pH: 5 (ref 5.0–8.0)

## 2020-09-20 LAB — BRAIN NATRIURETIC PEPTIDE: B Natriuretic Peptide: 66.8 pg/mL (ref 0.0–100.0)

## 2020-09-20 LAB — TROPONIN I (HIGH SENSITIVITY)
Troponin I (High Sensitivity): 11 ng/L (ref <18)
Troponin I (High Sensitivity): 14 ng/L (ref <18)

## 2020-09-20 LAB — C-REACTIVE PROTEIN: CRP: 5.9 mg/dL — ABNORMAL HIGH (ref <1.0)

## 2020-09-20 LAB — COMPREHENSIVE METABOLIC PANEL
ALT: 13 U/L (ref 0–44)
AST: 20 U/L (ref 15–41)
Albumin: 3.3 g/dL — ABNORMAL LOW (ref 3.5–5.0)
Alkaline Phosphatase: 51 U/L (ref 38–126)
Anion gap: 15 (ref 5–15)
BUN: 48 mg/dL — ABNORMAL HIGH (ref 8–23)
CO2: 26 mmol/L (ref 22–32)
Calcium: 8.6 mg/dL — ABNORMAL LOW (ref 8.9–10.3)
Chloride: 92 mmol/L — ABNORMAL LOW (ref 98–111)
Creatinine, Ser: 1.7 mg/dL — ABNORMAL HIGH (ref 0.44–1.00)
GFR, Estimated: 33 mL/min — ABNORMAL LOW (ref >60)
Glucose, Bld: 96 mg/dL (ref 70–99)
Potassium: 2.4 mmol/L — CL (ref 3.5–5.1)
Sodium: 133 mmol/L — ABNORMAL LOW (ref 135–145)
Total Bilirubin: 0.7 mg/dL (ref 0.3–1.2)
Total Protein: 7 g/dL (ref 6.5–8.1)

## 2020-09-20 LAB — BASIC METABOLIC PANEL
Anion gap: 12 (ref 5–15)
BUN: 35 mg/dL — ABNORMAL HIGH (ref 8–23)
CO2: 24 mmol/L (ref 22–32)
Calcium: 8.6 mg/dL — ABNORMAL LOW (ref 8.9–10.3)
Chloride: 99 mmol/L (ref 98–111)
Creatinine, Ser: 1.13 mg/dL — ABNORMAL HIGH (ref 0.44–1.00)
GFR, Estimated: 53 mL/min — ABNORMAL LOW (ref >60)
Glucose, Bld: 202 mg/dL — ABNORMAL HIGH (ref 70–99)
Potassium: 2.9 mmol/L — ABNORMAL LOW (ref 3.5–5.1)
Sodium: 135 mmol/L (ref 135–145)

## 2020-09-20 LAB — FERRITIN: Ferritin: 53 ng/mL (ref 11–307)

## 2020-09-20 LAB — TYPE AND SCREEN
ABO/RH(D): O POS
Antibody Screen: NEGATIVE

## 2020-09-20 LAB — RESP PANEL BY RT-PCR (FLU A&B, COVID) ARPGX2
Influenza A by PCR: NEGATIVE
Influenza B by PCR: NEGATIVE
SARS Coronavirus 2 by RT PCR: POSITIVE — AB

## 2020-09-20 LAB — CBC
HCT: 36.7 % (ref 36.0–46.0)
Hemoglobin: 12.3 g/dL (ref 12.0–15.0)
MCH: 28.5 pg (ref 26.0–34.0)
MCHC: 33.5 g/dL (ref 30.0–36.0)
MCV: 85 fL (ref 80.0–100.0)
Platelets: 236 10*3/uL (ref 150–400)
RBC: 4.32 MIL/uL (ref 3.87–5.11)
RDW: 14.9 % (ref 11.5–15.5)
WBC: 3 10*3/uL — ABNORMAL LOW (ref 4.0–10.5)
nRBC: 0 % (ref 0.0–0.2)

## 2020-09-20 LAB — HEPATITIS B SURFACE ANTIGEN: Hepatitis B Surface Ag: NONREACTIVE

## 2020-09-20 LAB — PROCALCITONIN: Procalcitonin: <0.1 ng/mL

## 2020-09-20 LAB — ABO/RH: ABO/RH(D): O POS

## 2020-09-20 LAB — LACTATE DEHYDROGENASE: LDH: 119 U/L (ref 98–192)

## 2020-09-20 LAB — HIV ANTIBODY (ROUTINE TESTING W REFLEX): HIV Screen 4th Generation wRfx: NONREACTIVE

## 2020-09-20 LAB — D-DIMER, QUANTITATIVE: D-Dimer, Quant: 1.75 ug/mL-FEU — ABNORMAL HIGH (ref 0.00–0.50)

## 2020-09-20 LAB — FIBRINOGEN: Fibrinogen: 514 mg/dL — ABNORMAL HIGH (ref 210–475)

## 2020-09-20 LAB — MAGNESIUM: Magnesium: 1.8 mg/dL (ref 1.7–2.4)

## 2020-09-20 MED ORDER — ZINC SULFATE 220 (50 ZN) MG PO CAPS
220.0000 mg | ORAL_CAPSULE | Freq: Every day | ORAL | Status: DC
  Administered 2020-09-20: 220 mg via ORAL
  Filled 2020-09-20 (×2): qty 1

## 2020-09-20 MED ORDER — DICYCLOMINE HCL 10 MG PO CAPS
10.0000 mg | ORAL_CAPSULE | Freq: Three times a day (TID) | ORAL | Status: DC | PRN
  Filled 2020-09-20: qty 1

## 2020-09-20 MED ORDER — FAMOTIDINE 20 MG PO TABS
20.0000 mg | ORAL_TABLET | Freq: Two times a day (BID) | ORAL | Status: DC
  Administered 2020-09-21: 20 mg via ORAL
  Filled 2020-09-20 (×3): qty 1

## 2020-09-20 MED ORDER — ONDANSETRON HCL 4 MG PO TABS: 4.0000 mg | ORAL_TABLET | ORAL | Status: DC | PRN

## 2020-09-20 MED ORDER — SERTRALINE HCL 50 MG PO TABS
100.0000 mg | ORAL_TABLET | Freq: Every day | ORAL | Status: DC
  Administered 2020-09-21: 100 mg via ORAL
  Filled 2020-09-20 (×2): qty 2

## 2020-09-20 MED ORDER — GUAIFENESIN-DM 100-10 MG/5ML PO SYRP
10.0000 mL | ORAL_SOLUTION | ORAL | Status: DC | PRN
  Administered 2020-09-20: 10 mL via ORAL
  Filled 2020-09-20: qty 10

## 2020-09-20 MED ORDER — ASCORBIC ACID 500 MG PO TABS
500.0000 mg | ORAL_TABLET | Freq: Every day | ORAL | Status: DC
  Administered 2020-09-20 – 2020-09-21 (×2): 500 mg via ORAL
  Filled 2020-09-20 (×2): qty 1

## 2020-09-20 MED ORDER — ACETAMINOPHEN 325 MG PO TABS: 650.0000 mg | ORAL_TABLET | Freq: Four times a day (QID) | ORAL | Status: DC | PRN

## 2020-09-20 MED ORDER — METHYLPREDNISOLONE SODIUM SUCC 125 MG IJ SOLR
1.0000 mg/kg | Freq: Two times a day (BID) | INTRAMUSCULAR | Status: DC
  Administered 2020-09-20 – 2020-09-21 (×3): 70 mg via INTRAVENOUS
  Filled 2020-09-20 (×3): qty 2

## 2020-09-20 MED ORDER — SODIUM CHLORIDE 0.9 % IV SOLN
100.0000 mg | Freq: Every day | INTRAVENOUS | Status: DC
  Filled 2020-09-20: qty 20

## 2020-09-20 MED ORDER — MAGNESIUM HYDROXIDE 400 MG/5ML PO SUSP: 30.0000 mL | Freq: Every day | ORAL | Status: DC | PRN

## 2020-09-20 MED ORDER — METHYLPREDNISOLONE SODIUM SUCC 125 MG IJ SOLR: 1.0000 mg/kg | Freq: Two times a day (BID) | INTRAMUSCULAR | Status: DC

## 2020-09-20 MED ORDER — POTASSIUM CHLORIDE CRYS ER 20 MEQ PO TBCR
40.0000 meq | EXTENDED_RELEASE_TABLET | Freq: Once | ORAL | Status: AC
  Administered 2020-09-20: 40 meq via ORAL
  Filled 2020-09-20: qty 2

## 2020-09-20 MED ORDER — LEVOTHYROXINE SODIUM 50 MCG PO TABS
125.0000 ug | ORAL_TABLET | Freq: Every day | ORAL | Status: DC
  Administered 2020-09-20 – 2020-09-21 (×2): 125 ug via ORAL
  Filled 2020-09-20 (×2): qty 1
  Filled 2020-09-20: qty 3

## 2020-09-20 MED ORDER — MAGNESIUM SULFATE 2 GM/50ML IV SOLN
2.0000 g | Freq: Once | INTRAVENOUS | Status: AC
  Administered 2020-09-20: 2 g via INTRAVENOUS
  Filled 2020-09-20: qty 50

## 2020-09-20 MED ORDER — PANTOPRAZOLE SODIUM 40 MG PO TBEC
40.0000 mg | DELAYED_RELEASE_TABLET | Freq: Every day | ORAL | Status: DC
  Administered 2020-09-20 – 2020-09-21 (×2): 40 mg via ORAL
  Filled 2020-09-20 (×2): qty 1

## 2020-09-20 MED ORDER — SODIUM CHLORIDE 0.9 % IV SOLN: 200.0000 mg | Freq: Once | INTRAVENOUS | Status: DC

## 2020-09-20 MED ORDER — ONDANSETRON HCL 4 MG PO TABS: 4.0000 mg | ORAL_TABLET | Freq: Four times a day (QID) | ORAL | Status: DC | PRN

## 2020-09-20 MED ORDER — GLIMEPIRIDE 2 MG PO TABS
2.0000 mg | ORAL_TABLET | Freq: Two times a day (BID) | ORAL | Status: DC
  Filled 2020-09-20 (×4): qty 1

## 2020-09-20 MED ORDER — TECHNETIUM TO 99M ALBUMIN AGGREGATED
4.0000 | Freq: Once | INTRAVENOUS | Status: AC | PRN
  Administered 2020-09-20: 4.05 via INTRAVENOUS

## 2020-09-20 MED ORDER — ALBUTEROL SULFATE HFA 108 (90 BASE) MCG/ACT IN AERS
2.0000 | INHALATION_SPRAY | Freq: Four times a day (QID) | RESPIRATORY_TRACT | Status: DC | PRN
  Filled 2020-09-20: qty 6.7

## 2020-09-20 MED ORDER — ATORVASTATIN CALCIUM 20 MG PO TABS
40.0000 mg | ORAL_TABLET | Freq: Every day | ORAL | Status: DC
  Administered 2020-09-20: 40 mg via ORAL
  Filled 2020-09-20: qty 2

## 2020-09-20 MED ORDER — POTASSIUM CHLORIDE 10 MEQ/100ML IV SOLN
10.0000 meq | Freq: Once | INTRAVENOUS | Status: AC
  Administered 2020-09-20: 10 meq via INTRAVENOUS
  Filled 2020-09-20: qty 100

## 2020-09-20 MED ORDER — SODIUM CHLORIDE 0.9 % IV SOLN: 100.0000 mg | Freq: Every day | INTRAVENOUS | Status: DC

## 2020-09-20 MED ORDER — PREDNISONE 20 MG PO TABS: 50.0000 mg | ORAL_TABLET | Freq: Every day | ORAL | Status: DC

## 2020-09-20 MED ORDER — POTASSIUM CHLORIDE IN NACL 20-0.9 MEQ/L-% IV SOLN
INTRAVENOUS | Status: DC
  Filled 2020-09-20: qty 1000

## 2020-09-20 MED ORDER — FOLIC ACID 1 MG PO TABS
1.0000 mg | ORAL_TABLET | Freq: Every day | ORAL | Status: DC
  Administered 2020-09-20 – 2020-09-21 (×2): 1 mg via ORAL
  Filled 2020-09-20 (×2): qty 1

## 2020-09-20 MED ORDER — SODIUM CHLORIDE 0.9 % IV SOLN
200.0000 mg | Freq: Once | INTRAVENOUS | Status: AC
  Administered 2020-09-20: 200 mg via INTRAVENOUS
  Filled 2020-09-20: qty 200

## 2020-09-20 MED ORDER — TRAZODONE HCL 50 MG PO TABS
25.0000 mg | ORAL_TABLET | Freq: Every evening | ORAL | Status: DC | PRN
  Filled 2020-09-20: qty 1

## 2020-09-20 MED ORDER — ONDANSETRON HCL 4 MG/2ML IJ SOLN: 4.0000 mg | Freq: Four times a day (QID) | INTRAMUSCULAR | Status: DC | PRN

## 2020-09-20 MED ORDER — ADULT MULTIVITAMIN W/MINERALS CH
1.0000 | ORAL_TABLET | Freq: Every day | ORAL | Status: DC
  Administered 2020-09-20 – 2020-09-21 (×2): 1 via ORAL
  Filled 2020-09-20 (×2): qty 1

## 2020-09-20 MED ORDER — POTASSIUM CHLORIDE CRYS ER 20 MEQ PO TBCR
20.0000 meq | EXTENDED_RELEASE_TABLET | Freq: Once | ORAL | Status: AC
  Administered 2020-09-20: 20 meq via ORAL
  Filled 2020-09-20: qty 1

## 2020-09-20 MED ORDER — ASPIRIN EC 81 MG PO TBEC
81.0000 mg | DELAYED_RELEASE_TABLET | Freq: Every day | ORAL | Status: DC
  Administered 2020-09-20: 81 mg via ORAL
  Filled 2020-09-20: qty 1

## 2020-09-20 MED ORDER — ASPIRIN EC 81 MG PO TBEC: 81.0000 mg | DELAYED_RELEASE_TABLET | Freq: Every day | ORAL | Status: DC

## 2020-09-20 MED ORDER — THIAMINE HCL 100 MG PO TABS
100.0000 mg | ORAL_TABLET | Freq: Every day | ORAL | Status: DC
  Administered 2020-09-20 – 2020-09-21 (×2): 100 mg via ORAL
  Filled 2020-09-20 (×2): qty 1

## 2020-09-20 MED ORDER — POTASSIUM CHLORIDE ER 10 MEQ PO TBCR
10.0000 meq | EXTENDED_RELEASE_TABLET | Freq: Three times a day (TID) | ORAL | Status: DC
  Administered 2020-09-20: 10 meq via ORAL
  Filled 2020-09-20 (×4): qty 1

## 2020-09-20 MED ORDER — ALPRAZOLAM 0.5 MG PO TABS
0.5000 mg | ORAL_TABLET | Freq: Two times a day (BID) | ORAL | Status: DC | PRN
  Administered 2020-09-20 (×2): 0.5 mg via ORAL
  Filled 2020-09-20 (×2): qty 1

## 2020-09-20 MED ORDER — ENOXAPARIN SODIUM 40 MG/0.4ML IJ SOSY
40.0000 mg | PREFILLED_SYRINGE | INTRAMUSCULAR | Status: DC
  Administered 2020-09-20 – 2020-09-21 (×2): 40 mg via SUBCUTANEOUS
  Filled 2020-09-20 (×2): qty 0.4

## 2020-09-20 MED ORDER — POTASSIUM CHLORIDE CRYS ER 20 MEQ PO TBCR
40.0000 meq | EXTENDED_RELEASE_TABLET | ORAL | Status: AC
  Administered 2020-09-20 (×2): 40 meq via ORAL
  Filled 2020-09-20 (×2): qty 2

## 2020-09-20 MED ORDER — SODIUM CHLORIDE 0.9% FLUSH
10.0000 mL | Freq: Two times a day (BID) | INTRAVENOUS | Status: DC
  Administered 2020-09-20: 10 mL via INTRAVENOUS

## 2020-09-20 MED ORDER — VENLAFAXINE HCL 37.5 MG PO TABS: 75.0000 mg | ORAL_TABLET | Freq: Every day | ORAL | Status: DC

## 2020-09-20 NOTE — Progress Notes (Signed)
Remdesivir - Pharmacy Brief Note   O:  CXR: "Some very mild patchy airspace opacity is noted primarily within the right lung." SpO2: 96-100% on RA   A/P:  Remdesivir 200 mg IVPB once followed by 100 mg IVPB daily x 4 days.   Renda Rolls, PharmD, [facility] 09/20/2020 1:57 AM

## 2020-09-20 NOTE — Progress Notes (Addendum)
PROGRESS NOTE    Misty Adams  ZJI:[id number] DOB: 11-22-1955 DOA: 09/19/2020 PCP: Rusty Aus, MD   Brief Narrative: 65 year old with past medical history significant for diabetes type 2, hypertension, hypothyroidism, psoriasis, CHF who presents to the ED complaining of acute onset of generalized weakness as well as mild cough.  She tested positive for COVID on home test on Monday.  She had a fever up to 104 at home.  Diarrhea.  Evaluation in the ED, patient was found to be hypokalemic with a potassium of 2.4, AKI creatinine 1.7, chest x-ray mild patchy airspace opacity consistent with COVID.  D-dimer elevated at 1.7.   Assessment & Plan:   Active Problems:   Pneumonia due to COVID-19 virus  1-Multifocal pneumonia, secondary to COVID-19; Patient presented with mild cough, COVID-19 PCR positive. Continue with remdesivir IV steroids, day 2 Follow inflammatory markers.  COVID-19 Labs  Recent Labs    09/20/20 0157  DDIMER 1.75*  FERRITIN 53  LDH 119  CRP 5.9*    Lab Results  Component Value Date   SARSCOV2NAA POSITIVE (A) 09/20/2020   [place] Not Detected 02/04/2019    Elevated D dimer;  VQ scan negative for PE>  Doppler LE ordered.   Weakness; related to hypokalemia, acute illness.   Hypokalemia: Improved to 2.9.  Plan to replete with 40 mEq x 2. Replete magnesium as well. Repeat labs in the morning.  AKI: Improved With IV fluids. Creatinine  peaked to 1.7, down to 1.1 NSL.   Dyslipidemia;  Continue with statins.   Anxiety and Depression:  Continue with Xanax and Effexor.   Diabetes Type 2:  SSI.  Continue with Amaryl.   HTN;  Monitor BP. BP normal, hold BP medications.   Hypothyroidism; Continue with Synthroid.    CHF; no ECHO available. No information in regard type of Heart failure in care everywhere.  Hold lasix due to hypokalemia.     Estimated body mass index is 27.28 kg/m as calculated from the following:   Height as of this  encounter: 5\' 3"  (1.6 m).   Weight as of this encounter: 69.9 kg.   DVT prophylaxis: Lovenox Code Status: Full code Family Communication: Care discussed with patient, daughter updated over phone.  Disposition Plan:  Status is: Inpatient  Remains inpatient appropriate because:IV treatments appropriate due to intensity of illness or inability to take PO   Dispo: The patient is from: Home              Anticipated d/c is to: Home              Patient currently is not medically stable to d/c.   Difficult to place patient No        Consultants:   None  Procedures:   VQ scan; negative for PE  Antimicrobials:    Subjective: Patient is alert and conversant.  She is feeling better, she is feeling less weak. Her  weakness is started all of the sudden  yesterday. She reports history of intermittent nausea and vomiting.  She has had work-up as an outpatient, she needs to have an endoscopy.  Objective: Vitals:   09/20/20 0413 09/20/20 0453 09/20/20 0839 09/20/20 1301  BP: (!) 112/54 (!) 106/59 119/62 120/69  Pulse: 71 68 81 65  Resp: 20 19 16 19   Temp:  98.6 F (37 C)    TempSrc:  Oral    SpO2: 94% 99% 95% 97%  Weight:      Height:  Intake/Output Summary (Last 24 hours) at 09/20/2020 1348 Last data filed at 09/20/2020 0957 Gross per 24 hour  Intake 1503.68 ml  Output --  Net 1503.68 ml   Filed Weights   09/19/20 2335  Weight: 69.9 kg    Examination:  General exam: Appears calm and comfortable  Respiratory system: Clear to auscultation. Respiratory effort normal. Cardiovascular system: S1 & S2 heard, RRR. No JVD, murmurs, rubs, gallops or clicks. No pedal edema. Gastrointestinal system: Abdomen is nondistended, soft and nontender. No organomegaly or masses felt. Normal bowel sounds heard. Central nervous system: Alert and oriented.  Extremities: Symmetric 5 x 5 power.    Data Reviewed: I have personally reviewed following labs and imaging  studies  CBC: Recent Labs  Lab 09/19/20 2344 09/20/20 0952  WBC 7.0 3.0*  NEUTROABS 5.2  --   HGB 13.2 12.3  HCT 38.6 36.7  MCV 84.6 85.0  PLT 230 536   Basic Metabolic Panel: Recent Labs  Lab 09/19/20 2344 09/20/20 0952  NA 133* 135  K 2.4* 2.9*  CL 92* 99  CO2 26 24  GLUCOSE 96 202*  BUN 48* 35*  CREATININE 1.70* 1.13*  CALCIUM 8.6* 8.6*  MG  --  1.8   GFR: Estimated Creatinine Clearance: 45.3 mL/min (A) (by C-G formula based on SCr of 1.13 mg/dL (H)). Liver Function Tests: Recent Labs  Lab 09/19/20 2344 09/20/20 0952  AST 20 18  ALT 13 15  ALKPHOS 51 49  BILITOT 0.7 0.5  PROT 7.0 6.5  ALBUMIN 3.3* 3.1*   No results for input(s): LIPASE, AMYLASE in the last 168 hours. No results for input(s): AMMONIA in the last 168 hours. Coagulation Profile: No results for input(s): INR, PROTIME in the last 168 hours. Cardiac Enzymes: No results for input(s): CKTOTAL, CKMB, CKMBINDEX, TROPONINI in the last 168 hours. BNP (last 3 results) No results for input(s): PROBNP in the last 8760 hours. HbA1C: No results for input(s): HGBA1C in the last 72 hours. CBG: No results for input(s): GLUCAP in the last 168 hours. Lipid Profile: No results for input(s): CHOL, HDL, LDLCALC, TRIG, CHOLHDL, LDLDIRECT in the last 72 hours. Thyroid Function Tests: No results for input(s): TSH, T4TOTAL, FREET4, T3FREE, THYROIDAB in the last 72 hours. Anemia Panel: Recent Labs    09/20/20 0157  FERRITIN 16   Sepsis Labs: Recent Labs  Lab 09/20/20 0157  PROCALCITON <0.10    Recent Results (from the past 240 hour(s))  Resp Panel by RT-PCR (Flu A&B, Covid) Nasopharyngeal Swab     Status: Abnormal   Collection Time: 09/20/20 12:01 AM   Specimen: Nasopharyngeal Swab; Nasopharyngeal(NP) swabs in vial transport medium  Result Value Ref Range Status   SARS Coronavirus 2 by RT PCR POSITIVE (A) NEGATIVE Final    Comment: RESULT CALLED TO, READ BACK BY AND VERIFIED WITH: HALEY  HAMILTON@0128  09/20/20 RH (NOTE) SARS-CoV-2 target nucleic acids are DETECTED.  The SARS-CoV-2 RNA is generally detectable in upper respiratory specimens during the acute phase of infection. Positive results are indicative of the presence of the identified virus, but do not rule out bacterial infection or co-infection with other pathogens not detected by the test. Clinical correlation with patient history and other diagnostic information is necessary to determine patient infection status. The expected result is Negative.  Fact Sheet for Patients: EntrepreneurPulse.com.au  Fact Sheet for Healthcare Providers: IncredibleEmployment.be  This test is not yet approved or cleared by the [place] FDA and  has been authorized for detection and/or diagnosis of  SARS-CoV-2 by FDA under an Emergency Use Authorization (EUA).  This EUA will remain in effect (meaning this test can be use d) for the duration of  the COVID-19 declaration under Section 564(b)(1) of the Act, 21 U.S.C. section 360bbb-3(b)(1), unless the authorization is terminated or revoked sooner.     Influenza A by PCR NEGATIVE NEGATIVE Final   Influenza B by PCR NEGATIVE NEGATIVE Final    Comment: (NOTE) The Xpert Xpress SARS-CoV-2/FLU/RSV plus assay is intended as an aid in the diagnosis of influenza from Nasopharyngeal swab specimens and should not be used as a sole basis for treatment. Nasal washings and aspirates are unacceptable for Xpert Xpress SARS-CoV-2/FLU/RSV testing.  Fact Sheet for Patients: EntrepreneurPulse.com.au  Fact Sheet for Healthcare Providers: IncredibleEmployment.be  This test is not yet approved or cleared by the [place] FDA and has been authorized for detection and/or diagnosis of SARS-CoV-2 by FDA under an Emergency Use Authorization (EUA). This EUA will remain in effect (meaning this test can be used) for the duration  of the COVID-19 declaration under Section 564(b)(1) of the Act, 21 U.S.C. section 360bbb-3(b)(1), unless the authorization is terminated or revoked.  Performed at [facility], [address] [place], [place] [address]          Radiology Studies: NM Pulmonary Perfusion  Result Date: 09/20/2020 CLINICAL DATA:  Acute onset dyspnea, smoker, COVID-19 positive, near syncope, elevated D-dimer EXAM: NUCLEAR MEDICINE PERFUSION LUNG SCAN TECHNIQUE: Perfusion images were obtained in multiple projections after intravenous injection of radiopharmaceutical. Ventilation scans intentionally deferred if perfusion scan and chest x-ray adequate for interpretation during COVID 19 epidemic. RADIOPHARMACEUTICALS:  4.05 mCi Tc-63m MAA IV COMPARISON:  Chest radiograph 09/19/2020 FINDINGS: Normal perfusion lung scan IMPRESSION: Normal perfusion lung scan. Electronically Signed   By: Lavonia Dana M.D.   On: 09/20/2020 13:33   DG Chest Port 1 View  Result Date: 09/19/2020 CLINICAL DATA:  COVID-19 positivity with cough, initial encounter EXAM: PORTABLE CHEST 1 VIEW COMPARISON:  07/13/2017 FINDINGS: Cardiac shadow is within normal limits. Aortic calcifications are noted. The lungs are well aerated bilaterally. Some very mild patchy airspace opacity is noted primarily within the right lung. No effusion is noted. No bony abnormality is seen. IMPRESSION: Mild patchy airspace opacity consistent with the given clinical history. No other focal abnormality is noted. Electronically Signed   By: Inez Catalina M.D.   On: 09/19/2020 23:51        Scheduled Meds: . vitamin C  500 mg Oral Daily  . aspirin EC  81 mg Oral QHS  . atorvastatin  40 mg Oral QHS  . enoxaparin (LOVENOX) injection  40 mg Subcutaneous Q24H  . famotidine  20 mg Oral BID  . folic acid  1 mg Oral Daily  . glimepiride  2 mg Oral BID  . levothyroxine  125 mcg Oral Daily  . methylPREDNISolone (SOLU-MEDROL) injection  1 mg/kg Intravenous Q12H    Followed by  . [START ON 09/23/2020] predniSONE  50 mg Oral Daily  . multivitamin with minerals  1 tablet Oral Daily  . pantoprazole  40 mg Oral Daily  . potassium chloride  40 mEq Oral Q4H  . sertraline  100 mg Oral Daily  . thiamine  100 mg Oral Daily  . zinc sulfate  220 mg Oral Daily   Continuous Infusions: . magnesium sulfate bolus IVPB    . [START ON 09/21/2020] remdesivir 100 mg in NS 100 mL       LOS: 0 days  Time spent: 35 minutes.     Elmarie Shiley, MD [facility]   If 7PM-7AM, please contact night-coverage [email or web address]  09/20/2020, 1:48 PM

## 2020-09-20 NOTE — ED Notes (Signed)
Dr. Mansy at bedside. 

## 2020-09-20 NOTE — H&P (Signed)
[place]   PATIENT NAME: Misty Adams    MR#:  [id number]  DATE OF BIRTH:  [birth year]  DATE OF ADMISSION:  09/19/2020  PRIMARY CARE PHYSICIAN: Rusty Aus, MD   Patient is coming from: Home  REQUESTING/REFERRING PHYSICIAN: Lurline Hare, MD  CHIEF COMPLAINT:   Chief Complaint  Patient presents with  . Near Syncope  . Covid Positive    HISTORY OF PRESENT ILLNESS:  Misty Adams is a 65 y.o. Caucasian female with medical history significant for CHF, type 2 diabetes mellitus, hypertension, dyslipidemia, hypothyroidism and psoriasis, who presented to the ER with acute onset of presyncope with generalized weakness as well as mild cough without wheezing or dyspnea.  She tested positive for COVID-19 on home test on Monday.  She had 2 Pfizer vaccines and 1 booster.  She admitted to fever that was up to 103 at home without chills.  She denied any nausea or vomiting or diaphoresis or diarrhea.  She denied any loss of taste or smell.  ED Course: Upon presentation to the emergency room vital signs were within normal.  Labs revealed hypokalemia of 2.4 and hyponatremia with type II chloremia antibiotic 48 with a creatinine of 1.7 with albumin of 3.3.  BNP was 66.8 and LDH 119 with high-sensitivity troponin I of 14 and later of 11.  Ferritin was 53 and procalcitonin less than 0.1.  CBC was unremarkable.  D-dimer was 1.75 and fibrinogen 514. EKG as reviewed by me : EKG showed normal sinus rhythm with a rate of 64 and probable left atrial enlargement with borderline prolonged QT interval with QTC of 491 MS. Imaging: Portable chest ray showed mild patchy airspace opacity consistent with COVID-19.  The patient was given IV remdesivir, IV Solu-Medrol, 1 L bolus of IV normal saline, 40 mEq p.o. potassium chloride and 10 mill equivalent of IV potassium chloride twice.  The patient will be admitted to a medical monitored bed for further evaluation and management. PAST MEDICAL HISTORY:    Past Medical History:  Diagnosis Date  . CHF (congestive heart failure) ([place])   . Diabetes mellitus without complication ([place])   . Hyperlipidemia   . Hypertension   . Hypothyroidism   . Psoriasis   . Tobacco abuse     PAST SURGICAL HISTORY:   Past Surgical History:  Procedure Laterality Date  . ABDOMINAL HYSTERECTOMY    . AORTIC ARCH ANGIOGRAPHY N/A 08/06/2017   Procedure: AORTIC ARCH ANGIOGRAPHY;  Surgeon: Conrad [place], MD;  Location: [facility];  Service: Cardiovascular;  Laterality: N/A;  . APPENDECTOMY    . CESAREAN SECTION    . dilatation and curettage    . KNEE SURGERY    . left wrist ganglion cyst removal    . PERIPHERAL VASCULAR INTERVENTION Left 08/06/2017   Procedure: PERIPHERAL VASCULAR INTERVENTION;  Surgeon: Conrad [place], MD;  Location: [facility];  Service: Cardiovascular;  Laterality: Left;  subclavian    SOCIAL HISTORY:   Social History   Tobacco Use  . Smoking status: Current Every Day Smoker    Packs/day: 2.00    Types: Cigarettes  . Smokeless tobacco: Never Used  Substance Use Topics  . Alcohol use: Yes    FAMILY HISTORY:   Family History  Problem Relation Age of Onset  . Heart attack Father     DRUG ALLERGIES:   Allergies  Allergen Reactions  . Hydrocodone-Acetaminophen Hives  . Vicodin [Hydrocodone-Acetaminophen] Hives    REVIEW OF  SYSTEMS:   ROS As per history of present illness. All pertinent systems were reviewed above. Constitutional, HEENT, cardiovascular, respiratory, GI, GU, musculoskeletal, neuro, psychiatric, endocrine, integumentary and hematologic systems were reviewed and are otherwise negative/unremarkable except for positive findings mentioned above in the HPI.   MEDICATIONS AT HOME:   Prior to Admission medications   Medication Sig Start Date End Date Taking? Authorizing Provider  acetaminophen (TYLENOL) 500 MG tablet Take 1,000 mg by mouth every 6 (six) hours as needed for moderate pain or  headache.    [provider]  albuterol (PROVENTIL HFA;VENTOLIN HFA) 108 (90 Base) MCG/ACT inhaler Inhale 2 puffs into the lungs every 6 (six) hours as needed for wheezing or shortness of breath. 07/14/17   Darel Hong, MD  ALPRAZolam Duanne Moron) 0.5 MG tablet Take 0.5 mg by mouth 2 (two) times daily as needed for anxiety.  01/16/17   [provider]  aspirin 81 MG tablet Take 81 mg by mouth at bedtime.     [provider]  atorvastatin (LIPITOR) 40 MG tablet Take 1 tablet (40 mg total) by mouth at bedtime. 07/14/17 07/14/18  Darel Hong, MD  dicyclomine (BENTYL) 10 MG capsule Take 10 mg by mouth 3 (three) times daily as needed for spasms.  07/09/17 07/09/18  [provider]  furosemide (LASIX) 20 MG tablet TAKE 20 MG BY MOUTH ONCE DAILY 07/10/16   [provider]  glimepiride (AMARYL) 4 MG tablet 2 mg 2 (two) times daily. 08/14/17   [provider]  hydrochlorothiazide (HYDRODIURIL) 25 MG tablet Take 25 mg by mouth 2 (two) times daily.     [provider]  levothyroxine (SYNTHROID, LEVOTHROID) 125 MCG tablet Take 125 mcg by mouth daily.     [provider]  metFORMIN (GLUCOPHAGE-XR) 500 MG 24 hr tablet Take 1,000 mg by mouth daily with breakfast.    [provider]  ondansetron (ZOFRAN) 4 MG tablet Take 4 mg by mouth every 4 (four) hours as needed for nausea or vomiting.    [provider]  pantoprazole (PROTONIX) 40 MG tablet Take 40 mg by mouth daily.     [provider]  potassium chloride (K-DUR) 10 MEQ tablet Take 10 mEq by mouth 3 (three) times daily.  10/11/15   [provider]  sertraline (ZOLOFT) 100 MG tablet TAKE 100 MG BY MOUTH ONCE DAILY 07/10/16   [provider]  venlafaxine (EFFEXOR) 75 MG tablet Take 75 mg by mouth daily.    [provider]      VITAL SIGNS:  Blood pressure (!) 100/56, pulse 66, temperature 97.7 F (36.5 C), temperature source Oral, resp. rate  14, height 5\' 3"  (1.6 m), weight 69.9 kg, SpO2 96 %.  PHYSICAL EXAMINATION:  Physical Exam  GENERAL:  65 y.o.-year-old Caucasian female patient lying in the bed with no acute distress.  EYES: Pupils equal, round, reactive to light and accommodation. No scleral icterus. Extraocular muscles intact.  HEENT: Head atraumatic, normocephalic. Oropharynx and nasopharynx clear.  NECK:  Supple, no jugular venous distention. No thyroid enlargement, no tenderness.  LUNGS: Diminished bibasal breath sounds with bibasal crackles. CARDIOVASCULAR: Regular rate and rhythm, S1, S2 normal. No murmurs, rubs, or gallops.  ABDOMEN: Soft, nondistended, nontender. Bowel sounds present. No organomegaly or mass.  EXTREMITIES: No pedal edema, cyanosis, or clubbing.  NEUROLOGIC: Cranial nerves II through XII are intact. Muscle strength 5/5 in all extremities. Sensation intact. Gait not checked.  PSYCHIATRIC: The patient is alert and oriented x 3.  Normal affect and good eye contact. SKIN: No obvious rash, lesion, or ulcer.   LABORATORY PANEL:   CBC Recent Labs  Lab 09/19/20 2344  WBC 7.0  HGB 13.2  HCT 38.6  PLT 230   ------------------------------------------------------------------------------------------------------------------  Chemistries  Recent Labs  Lab 09/19/20 2344  NA 133*  K 2.4*  CL 92*  CO2 26  GLUCOSE 96  BUN 48*  CREATININE 1.70*  CALCIUM 8.6*  AST 20  ALT 13  ALKPHOS 51  BILITOT 0.7   ------------------------------------------------------------------------------------------------------------------  Cardiac Enzymes No results for input(s): TROPONINI in the last 168 hours. ------------------------------------------------------------------------------------------------------------------  RADIOLOGY:  DG Chest Port 1 View  Result Date: 09/19/2020 CLINICAL DATA:  COVID-19 positivity with cough, initial encounter EXAM: PORTABLE CHEST 1 VIEW COMPARISON:  07/13/2017 FINDINGS:  Cardiac shadow is within normal limits. Aortic calcifications are noted. The lungs are well aerated bilaterally. Some very mild patchy airspace opacity is noted primarily within the right lung. No effusion is noted. No bony abnormality is seen. IMPRESSION: Mild patchy airspace opacity consistent with the given clinical history. No other focal abnormality is noted. Electronically Signed   By: Inez Catalina M.D.   On: 09/19/2020 23:51      IMPRESSION AND PLAN:  Active Problems:   Pneumonia due to COVID-19 virus  1. Multifocal pneumonia secondary to COVID-19 with associated generalized weakness and presyncope. -The patient will be admitted to an isolation monitored bed with droplet and contact precautions. --The patient will be placed on scheduled Mucinex and as needed Tussionex. -We will avoid nebulization as much as we can, give bronchodilator MDI if needed, and with deterioration of oxygenation try to avoid BiPAP/CPAP if possible.    -Will obtain sputum Gram stain culture and sensitivity and follow blood cultures. -O2 protocol will be followed. -We will follow CRP, ferritin, LDH and D-dimer. -Will follow manual differential for ANC/ALC ratio as well as follow troponin I and daily CBC with manual differential and CMP. - Will place the patient on IV Remdesivir and IV steroid therapy that was started in the ER will be held off given normal inflammatory markers and lack of hypoxia. -The patient will be placed on vitamin D3, vitamin C, zinc sulfate, p.o. Pepcid and aspirin. -Will obtain VQ scan to rule out PE given elevated D-dimer. - The patient will be hydrated with IV normal saline.  2.  Hypokalemia. - We will replace potassium and check magnesium level.  3.  Acute kidney injury. - The patient will be placed on hydration with IV normal saline. - We will follow BMP.  4.  Dyslipidemia. - We will continue statin therapy.  5.  Anxiety and depression. - We will continue Xanax and  Effexor.  6.  Type 2 diabetes mellitus. - We will place the patient on supplement coverage with NovoLog and continue basal coverage.  7.  Essential hypertension. - We will hold off antihypertensives given borderline hypotension and acute kidney injury  8.  Hypothyroidism. - We will continue Synthroid.  DVT prophylaxis: Lovenox. Code Status: full code. Family Communication:  The plan of care was discussed in details with the patient (and family). I answered all questions. The patient agreed to proceed with the above mentioned plan. Further management will depend upon hospital course. Disposition Plan: Back to previous home environment Consults called: none. All the records are reviewed and case discussed with ED provider.  Status is: Inpatient  Remains inpatient appropriate because:Ongoing diagnostic testing needed not appropriate for outpatient work up, Unsafe d/c plan, IV treatments  appropriate due to intensity of illness or inability to take PO and Inpatient level of care appropriate due to severity of illness   Dispo: The patient is from: Home              Anticipated d/c is to: Home              Patient currently is not medically stable to d/c.   Difficult to place patient No   TOTAL TIME TAKING CARE OF THIS PATIENT: 55 minutes.    Christel Mormon M.D on 09/20/2020 at 3:26 AM  [facility]   From 7 PM-7 AM, contact night-coverage [email or web address]  CC: Primary care physician; Rusty Aus, MD

## 2020-09-20 NOTE — ED Notes (Signed)
Patient updated on inpatient bed assignment.

## 2020-09-20 NOTE — ED Notes (Signed)
Patient transported to inpatient unit via wheelchair by this RN.

## 2020-09-20 NOTE — ED Notes (Signed)
Pt resting in stretcher with eyes closed, chest rise and fall observed with regular and unlabored respirations. Call light and personal belongings in reach.

## 2020-09-20 NOTE — ED Notes (Signed)
Pt requesting IV potassium be slowed down d/t discomfort. IV site assessed, remains patent. Rate reduced, pt reports this helped with discomfort. Pt denies further needs.

## 2020-09-21 DIAGNOSIS — R531 Weakness: Secondary | ICD-10-CM | POA: Diagnosis not present

## 2020-09-21 DIAGNOSIS — R7989 Other specified abnormal findings of blood chemistry: Secondary | ICD-10-CM | POA: Diagnosis not present

## 2020-09-21 DIAGNOSIS — R55 Syncope and collapse: Secondary | ICD-10-CM | POA: Diagnosis not present

## 2020-09-21 DIAGNOSIS — E876 Hypokalemia: Secondary | ICD-10-CM | POA: Diagnosis not present

## 2020-09-21 DIAGNOSIS — J189 Pneumonia, unspecified organism: Secondary | ICD-10-CM | POA: Diagnosis present

## 2020-09-21 LAB — C-REACTIVE PROTEIN: CRP: 2.9 mg/dL — ABNORMAL HIGH (ref <1.0)

## 2020-09-21 LAB — COMPREHENSIVE METABOLIC PANEL
ALT: 14 U/L (ref 0–44)
AST: 20 U/L (ref 15–41)
Albumin: 3.4 g/dL — ABNORMAL LOW (ref 3.5–5.0)
Alkaline Phosphatase: 51 U/L (ref 38–126)
Anion gap: 11 (ref 5–15)
BUN: 35 mg/dL — ABNORMAL HIGH (ref 8–23)
CO2: 24 mmol/L (ref 22–32)
Calcium: 9.3 mg/dL (ref 8.9–10.3)
Chloride: 103 mmol/L (ref 98–111)
Creatinine, Ser: 0.92 mg/dL (ref 0.44–1.00)
GFR, Estimated: >60 mL/min (ref >60)
Glucose, Bld: 144 mg/dL — ABNORMAL HIGH (ref 70–99)
Potassium: 3.7 mmol/L (ref 3.5–5.1)
Sodium: 138 mmol/L (ref 135–145)
Total Bilirubin: 0.4 mg/dL (ref 0.3–1.2)
Total Protein: 6.7 g/dL (ref 6.5–8.1)

## 2020-09-21 LAB — CBC WITH DIFFERENTIAL/PLATELET
Abs Immature Granulocytes: 0.06 10*3/uL (ref 0.00–0.07)
Basophils Absolute: 0 10*3/uL (ref 0.0–0.1)
Basophils Relative: 0 %
Eosinophils Absolute: 0 10*3/uL (ref 0.0–0.5)
Eosinophils Relative: 0 %
HCT: 39 % (ref 36.0–46.0)
Hemoglobin: 12.7 g/dL (ref 12.0–15.0)
Immature Granulocytes: 1 %
Lymphocytes Relative: 18 %
Lymphs Abs: 1.4 10*3/uL (ref 0.7–4.0)
MCH: 28.6 pg (ref 26.0–34.0)
MCHC: 32.6 g/dL (ref 30.0–36.0)
MCV: 87.8 fL (ref 80.0–100.0)
Monocytes Absolute: 0.3 10*3/uL (ref 0.1–1.0)
Monocytes Relative: 4 %
Neutro Abs: 6.1 10*3/uL (ref 1.7–7.7)
Neutrophils Relative %: 77 %
Platelets: 265 10*3/uL (ref 150–400)
RBC: 4.44 MIL/uL (ref 3.87–5.11)
RDW: 15 % (ref 11.5–15.5)
WBC: 8 10*3/uL (ref 4.0–10.5)
nRBC: 0 % (ref 0.0–0.2)

## 2020-09-21 LAB — FERRITIN: Ferritin: 39 ng/mL (ref 11–307)

## 2020-09-21 LAB — D-DIMER, QUANTITATIVE: D-Dimer, Quant: 1.52 ug/mL-FEU — ABNORMAL HIGH (ref 0.00–0.50)

## 2020-09-21 MED ORDER — ASCORBIC ACID 500 MG PO TABS: 500.0000 mg | ORAL_TABLET | Freq: Every day | ORAL | 0 refills | Status: DC

## 2020-09-21 MED ORDER — PREDNISONE 20 MG PO TABS: 40.0000 mg | ORAL_TABLET | Freq: Every day | ORAL | 0 refills | Status: AC

## 2020-09-21 MED ORDER — ZINC SULFATE 220 (50 ZN) MG PO CAPS: 220.0000 mg | ORAL_CAPSULE | Freq: Every day | ORAL | 0 refills | Status: DC

## 2020-09-21 MED ORDER — GUAIFENESIN-DM 100-10 MG/5ML PO SYRP: 10.0000 mL | ORAL_SOLUTION | ORAL | 0 refills | Status: DC | PRN

## 2020-09-21 MED ORDER — ADULT MULTIVITAMIN W/MINERALS CH: 1.0000 | ORAL_TABLET | Freq: Every day | ORAL | 0 refills | Status: DC

## 2020-09-21 MED ORDER — FUROSEMIDE 20 MG PO TABS: 40.0000 mg | ORAL_TABLET | Freq: Every day | ORAL | 0 refills | Status: DC | PRN

## 2020-09-21 MED ORDER — POTASSIUM CHLORIDE CRYS ER 20 MEQ PO TBCR: 40.0000 meq | EXTENDED_RELEASE_TABLET | Freq: Two times a day (BID) | ORAL | 0 refills | Status: DC | PRN

## 2020-09-21 NOTE — Discharge Summary (Signed)
Physician Discharge Summary  Misty Adams ZOX:[id number] DOB: 1955/07/04 DOA: 09/19/2020  PCP: Rusty Aus, MD  Admit date: 09/19/2020 Discharge date: 09/21/2020  Admitted From: Home  Disposition: Home.   Recommendations for Outpatient Follow-up:  1. Follow up with PCP in 1-2 weeks 2. Please obtain BMP/CBC in one week to monitor potassium level.  3. Needs to follow up with GI for endoscopy further evaluation of intermittent vomiting. Might need esophagogram or endoscopy   Home Health: none  Discharge Condition: Stable.  CODE STATUS: Full code Diet recommendation: Heart Healthy  Brief/Interim Summary: 65 year old with past medical history significant for diabetes type 2, hypertension, hypothyroidism, psoriasis, CHF who presents to the ED complaining of acute onset of generalized weakness as well as mild cough.  She tested positive for COVID on home test on Monday.  She had a fever up to 104 at home.  Diarrhea.  Evaluation in the ED, patient was found to be hypokalemic with a potassium of 2.4, AKI creatinine 1.7, chest x-ray mild patchy airspace opacity consistent with COVID.  D-dimer elevated at 1.7.   1-Multifocal pneumonia, secondary to COVID-19; Patient presented with mild cough, COVID-19 PCR positive. Received  remdesivir IV steroids, day 1 Follow inflammatory markers. She does not want to continue with Remdesivir. Plan to discharge home on prednisone for 5 days.  Stable for discharge.   COVID-19 Labs  Recent Labs (last 2 labs)      Recent Labs    09/20/20 0157  DDIMER 1.75*  FERRITIN 53  LDH 119  CRP 5.9*      Recent Labs       Lab Results  Component Value Date   SARSCOV2NAA POSITIVE (A) 09/20/2020   [place] Not Detected 02/04/2019      Elevated D dimer;  VQ scan negative for PE>  Doppler LE ordered.   Weakness; related to hypokalemia, acute illness.  resolved  Hypokalemia:  Replaced.  Replete magnesium as well. Patient to take 40  meq of potassium BID when she takes lasix. She will take lasix as needed, for increase weight (more than 2 pounds in 24 hours).    AKI: Improved With IV fluids. Creatinine  peaked to 1.7, down to 1.1 NSL.   Dyslipidemia;  Continue with statins.   Anxiety and Depression:  Continue with Xanax and Effexor.   Diabetes Type 2:  SSI.  She was not taking Amaryl. Continue with metformin.   HTN;  Monitor BP. BP normal, hold HCTZ, triamterene.   Hypothyroidism; Continue with Synthroid.    CHF; no ECHO available. No information in regard type of Heart failure in care everywhere.  Resume lasix PRN.  She will take lasix as needed, for increase weight (more than 2 pounds in 24 hours).        Discharge Diagnoses:  Active Problems:   Pneumonia due to COVID-19 virus   Pneumonia    Discharge Instructions  Discharge Instructions    Diet - low sodium heart healthy   Complete by: As directed    Increase activity slowly   Complete by: As directed      Allergies as of 09/21/2020      Reactions   Hydrocodone-acetaminophen Hives   Vicodin [hydrocodone-acetaminophen] Hives      Medication List    STOP taking these medications   glimepiride 4 MG tablet Commonly known as: AMARYL   hydrochlorothiazide 25 MG tablet Commonly known as: HYDRODIURIL   potassium chloride 10 MEQ tablet Commonly known as: KLOR-CON   triamterene-hydrochlorothiazide 75-50  MG tablet Commonly known as: MAXZIDE     TAKE these medications   acetaminophen 500 MG tablet Commonly known as: TYLENOL Take 1,000 mg by mouth every 6 (six) hours as needed for moderate pain or headache.   albuterol 108 (90 Base) MCG/ACT inhaler Commonly known as: VENTOLIN HFA Inhale 2 puffs into the lungs every 6 (six) hours as needed for wheezing or shortness of breath.   ALPRAZolam 0.5 MG tablet Commonly known as: XANAX Take 0.5 mg by mouth 2 (two) times daily as needed for anxiety.   ascorbic acid 500 MG  tablet Commonly known as: VITAMIN C Take 1 tablet (500 mg total) by mouth daily. Start taking on: September 22, 2020   aspirin 81 MG tablet Take 81 mg by mouth at bedtime.   atorvastatin 40 MG tablet Commonly known as: Lipitor Take 1 tablet (40 mg total) by mouth at bedtime.   furosemide 20 MG tablet Commonly known as: LASIX Take 2 tablets (40 mg total) by mouth daily as needed for fluid. What changed:   when to take this  reasons to take this   guaiFENesin-dextromethorphan 100-10 MG/5ML syrup Commonly known as: ROBITUSSIN DM Take 10 mLs by mouth every 4 (four) hours as needed for cough.   levothyroxine 125 MCG tablet Commonly known as: SYNTHROID Take 125 mcg by mouth daily.   metFORMIN 500 MG 24 hr tablet Commonly known as: GLUCOPHAGE-XR Take 500 mg by mouth 2 (two) times daily with a meal.   multivitamin with minerals Tabs tablet Take 1 tablet by mouth daily. Start taking on: September 22, 2020   ondansetron 4 MG tablet Commonly known as: ZOFRAN Take 4 mg by mouth every 4 (four) hours as needed for nausea or vomiting.   pantoprazole 40 MG tablet Commonly known as: PROTONIX Take 40 mg by mouth daily.   potassium chloride SA 20 MEQ tablet Commonly known as: KLOR-CON Take 2 tablets (40 mEq total) by mouth 2 (two) times daily as needed (Take potasium tablets twice a day when you take lasix.).   predniSONE 20 MG tablet Commonly known as: DELTASONE Take 2 tablets (40 mg total) by mouth daily for 5 days. Start taking on: September 23, 2020   temazepam 15 MG capsule Commonly known as: RESTORIL Take 15 mg by mouth at bedtime as needed for sleep.   traZODone 100 MG tablet Commonly known as: DESYREL Take 100 mg by mouth daily as needed for sleep.   zinc sulfate 220 (50 Zn) MG capsule Take 1 capsule (220 mg total) by mouth daily.       Follow-up Information    Rusty Aus, MD. Go on 09/28/2020.   Specialty: Internal Medicine Why: 2:15pm appointment Contact  information: [place] [address] [phone]              Allergies  Allergen Reactions  . Hydrocodone-Acetaminophen Hives  . Vicodin [Hydrocodone-Acetaminophen] Hives    Consultations:  None   Procedures/Studies: NM Pulmonary Perfusion  Result Date: 09/20/2020 CLINICAL DATA:  Acute onset dyspnea, smoker, COVID-19 positive, near syncope, elevated D-dimer EXAM: NUCLEAR MEDICINE PERFUSION LUNG SCAN TECHNIQUE: Perfusion images were obtained in multiple projections after intravenous injection of radiopharmaceutical. Ventilation scans intentionally deferred if perfusion scan and chest x-ray adequate for interpretation during COVID 19 epidemic. RADIOPHARMACEUTICALS:  4.05 mCi Tc-78m MAA IV COMPARISON:  Chest radiograph 09/19/2020 FINDINGS: Normal perfusion lung scan IMPRESSION: Normal perfusion lung scan. Electronically Signed   By: Crist Infante.D.  On: 09/20/2020 13:33   US Venous Img Lower Bilateral (DVT)  Result Date: 09/21/2020 CLINICAL DATA:  Elevated D-dimer EXAM: Bilateral LOWER EXTREMITY VENOUS DOPPLER ULTRASOUND TECHNIQUE: Gray-scale sonography with compression, as well as color and duplex ultrasound, were performed to evaluate the deep venous system(s) from the level of the common femoral vein through the popliteal and proximal calf veins. COMPARISON:  None. FINDINGS: VENOUS Normal compressibility of the common femoral, superficial femoral, and popliteal veins, as well as the visualized calf veins. Visualized portions of profunda femoral vein and great saphenous vein unremarkable. No filling defects to suggest DVT on grayscale or color Doppler imaging. Doppler waveforms show normal direction of venous flow, normal respiratory plasticity and response to augmentation. OTHER None. Limitations: none IMPRESSION: Negative. Electronically Signed   By: Donavan Foil M.D.   On: 09/21/2020 00:40   DG Chest Port 1 View  Result  Date: 09/19/2020 CLINICAL DATA:  COVID-19 positivity with cough, initial encounter EXAM: PORTABLE CHEST 1 VIEW COMPARISON:  07/13/2017 FINDINGS: Cardiac shadow is within normal limits. Aortic calcifications are noted. The lungs are well aerated bilaterally. Some very mild patchy airspace opacity is noted primarily within the right lung. No effusion is noted. No bony abnormality is seen. IMPRESSION: Mild patchy airspace opacity consistent with the given clinical history. No other focal abnormality is noted. Electronically Signed   By: Inez Catalina M.D.   On: 09/19/2020 23:51     Subjective: She is breathing better, no SOB, no significant cough.  She wishes to go home   Discharge Exam: Vitals:   09/20/20 2344 09/21/20 0535  BP: 120/72 106/60  Pulse: 62 74  Resp: 19 17  Temp: 97.9 F (36.6 C) 97.8 F (36.6 C)  SpO2: 99% 96%     General: Pt is alert, awake, not in acute distress Cardiovascular: RRR, S1/S2 +, no rubs, no gallops Respiratory: CTA bilaterally, no wheezing, no rhonchi Abdominal: Soft, NT, ND, bowel sounds + Extremities: no edema, no cyanosis    The results of significant diagnostics from this hospitalization (including imaging, microbiology, ancillary and laboratory) are listed below for reference.     Microbiology: Recent Results (from the past 240 hour(s))  Resp Panel by RT-PCR (Flu A&B, Covid) Nasopharyngeal Swab     Status: Abnormal   Collection Time: 09/20/20 12:01 AM   Specimen: Nasopharyngeal Swab; Nasopharyngeal(NP) swabs in vial transport medium  Result Value Ref Range Status   SARS Coronavirus 2 by RT PCR POSITIVE (A) NEGATIVE Final    Comment: RESULT CALLED TO, READ BACK BY AND VERIFIED WITH: HALEY HAMILTON@0128  09/20/20 RH (NOTE) SARS-CoV-2 target nucleic acids are DETECTED.  The SARS-CoV-2 RNA is generally detectable in upper respiratory specimens during the acute phase of infection. Positive results are indicative of the presence of the identified  virus, but do not rule out bacterial infection or co-infection with other pathogens not detected by the test. Clinical correlation with patient history and other diagnostic information is necessary to determine patient infection status. The expected result is Negative.  Fact Sheet for Patients: EntrepreneurPulse.com.au  Fact Sheet for Healthcare Providers: IncredibleEmployment.be  This test is not yet approved or cleared by the [place] FDA and  has been authorized for detection and/or diagnosis of SARS-CoV-2 by FDA under an Emergency Use Authorization (EUA).  This EUA will remain in effect (meaning this test can be use d) for the duration of  the COVID-19 declaration under Section 564(b)(1) of the Act, 21 U.S.C. section 360bbb-3(b)(1), unless the authorization is terminated or  revoked sooner.     Influenza A by PCR NEGATIVE NEGATIVE Final   Influenza B by PCR NEGATIVE NEGATIVE Final    Comment: (NOTE) The Xpert Xpress SARS-CoV-2/FLU/RSV plus assay is intended as an aid in the diagnosis of influenza from Nasopharyngeal swab specimens and should not be used as a sole basis for treatment. Nasal washings and aspirates are unacceptable for Xpert Xpress SARS-CoV-2/FLU/RSV testing.  Fact Sheet for Patients: EntrepreneurPulse.com.au  Fact Sheet for Healthcare Providers: IncredibleEmployment.be  This test is not yet approved or cleared by the [place] FDA and has been authorized for detection and/or diagnosis of SARS-CoV-2 by FDA under an Emergency Use Authorization (EUA). This EUA will remain in effect (meaning this test can be used) for the duration of the COVID-19 declaration under Section 564(b)(1) of the Act, 21 U.S.C. section 360bbb-3(b)(1), unless the authorization is terminated or revoked.  Performed at [facility], [address] [place], [place] [address]      Labs: BNP  (last 3 results) Recent Labs    09/20/20 0157  BNP 32.9   Basic Metabolic Panel: Recent Labs  Lab 09/19/20 2344 09/20/20 0952 09/21/20 0416  NA 133* 135 138  K 2.4* 2.9* 3.7  CL 92* 99 103  CO2 26 24 24   GLUCOSE 96 202* 144*  BUN 48* 35* 35*  CREATININE 1.70* 1.13* 0.92  CALCIUM 8.6* 8.6* 9.3  MG  --  1.8  --    Liver Function Tests: Recent Labs  Lab 09/19/20 2344 09/20/20 0952 09/21/20 0416  AST 20 18 20   ALT 13 15 14   ALKPHOS 51 49 51  BILITOT 0.7 0.5 0.4  PROT 7.0 6.5 6.7  ALBUMIN 3.3* 3.1* 3.4*   No results for input(s): LIPASE, AMYLASE in the last 168 hours. No results for input(s): AMMONIA in the last 168 hours. CBC: Recent Labs  Lab 09/19/20 2344 09/20/20 0952 09/21/20 0416  WBC 7.0 3.0* 8.0  NEUTROABS 5.2  --  6.1  HGB 13.2 12.3 12.7  HCT 38.6 36.7 39.0  MCV 84.6 85.0 87.8  PLT 230 236 265   Cardiac Enzymes: No results for input(s): CKTOTAL, CKMB, CKMBINDEX, TROPONINI in the last 168 hours. BNP: Invalid input(s): POCBNP CBG: No results for input(s): GLUCAP in the last 168 hours. D-Dimer Recent Labs    09/20/20 0157 09/21/20 0416  DDIMER 1.75* 1.52*   Hgb A1c No results for input(s): HGBA1C in the last 72 hours. Lipid Profile No results for input(s): CHOL, HDL, LDLCALC, TRIG, CHOLHDL, LDLDIRECT in the last 72 hours. Thyroid function studies No results for input(s): TSH, T4TOTAL, T3FREE, THYROIDAB in the last 72 hours.  Invalid input(s): FREET3 Anemia work up Recent Labs    09/20/20 0157 09/21/20 0416  FERRITIN 53 39   Urinalysis    Component Value Date/Time   COLORURINE YELLOW (A) 09/20/2020 0047   APPEARANCEUR HAZY (A) 09/20/2020 0047   LABSPEC 1.010 09/20/2020 0047   PHURINE 5.0 09/20/2020 [place] 09/20/2020 0047   HGBUR NEGATIVE 09/20/2020 0047   BILIRUBINUR NEGATIVE 09/20/2020 0047   KETONESUR NEGATIVE 09/20/2020 0047   PROTEINUR NEGATIVE 09/20/2020 0047   NITRITE NEGATIVE 09/20/2020 0047    LEUKOCYTESUR NEGATIVE 09/20/2020 0047   Sepsis Labs Invalid input(s): PROCALCITONIN,  WBC,  LACTICIDVEN Microbiology Recent Results (from the past 240 hour(s))  Resp Panel by RT-PCR (Flu A&B, Covid) Nasopharyngeal Swab     Status: Abnormal   Collection Time: 09/20/20 12:01 AM   Specimen: Nasopharyngeal Swab; Nasopharyngeal(NP) swabs in  vial transport medium  Result Value Ref Range Status   SARS Coronavirus 2 by RT PCR POSITIVE (A) NEGATIVE Final    Comment: RESULT CALLED TO, READ BACK BY AND VERIFIED WITH: HALEY HAMILTON@0128  09/20/20 RH (NOTE) SARS-CoV-2 target nucleic acids are DETECTED.  The SARS-CoV-2 RNA is generally detectable in upper respiratory specimens during the acute phase of infection. Positive results are indicative of the presence of the identified virus, but do not rule out bacterial infection or co-infection with other pathogens not detected by the test. Clinical correlation with patient history and other diagnostic information is necessary to determine patient infection status. The expected result is Negative.  Fact Sheet for Patients: EntrepreneurPulse.com.au  Fact Sheet for Healthcare Providers: IncredibleEmployment.be  This test is not yet approved or cleared by the [place] FDA and  has been authorized for detection and/or diagnosis of SARS-CoV-2 by FDA under an Emergency Use Authorization (EUA).  This EUA will remain in effect (meaning this test can be use d) for the duration of  the COVID-19 declaration under Section 564(b)(1) of the Act, 21 U.S.C. section 360bbb-3(b)(1), unless the authorization is terminated or revoked sooner.     Influenza A by PCR NEGATIVE NEGATIVE Final   Influenza B by PCR NEGATIVE NEGATIVE Final    Comment: (NOTE) The Xpert Xpress SARS-CoV-2/FLU/RSV plus assay is intended as an aid in the diagnosis of influenza from Nasopharyngeal swab specimens and should not be used as a sole basis for  treatment. Nasal washings and aspirates are unacceptable for Xpert Xpress SARS-CoV-2/FLU/RSV testing.  Fact Sheet for Patients: EntrepreneurPulse.com.au  Fact Sheet for Healthcare Providers: IncredibleEmployment.be  This test is not yet approved or cleared by the [place] FDA and has been authorized for detection and/or diagnosis of SARS-CoV-2 by FDA under an Emergency Use Authorization (EUA). This EUA will remain in effect (meaning this test can be used) for the duration of the COVID-19 declaration under Section 564(b)(1) of the Act, 21 U.S.C. section 360bbb-3(b)(1), unless the authorization is terminated or revoked.  Performed at [facility], [address] [place], [place] [address]      Time coordinating discharge: 40 minutes  SIGNED:   Elmarie Shiley, MD  [facility]

## 2020-09-21 NOTE — Discharge Instructions (Signed)
Please Quarantine for 10 days from 09/20/2020.  Weight yourself daily, if your weight increase by two pound in 24 hours take lasix, take potassium as well.

## 2020-09-21 NOTE — Care Management CC44 (Signed)
Condition Code 44 Documentation Completed  Patient Details  Name: Misty Adams MRN: [id number] Date of Birth: 1955/07/22   Condition Code 44 given:  Yes Patient signature on Condition Code 44 notice:  Yes Documentation of 2 MD's agreement:  Yes Code 44 added to claim:  Yes  Reviewed with patient via phone. Printed copy for patient's records.  [place], LCSW 09/21/2020, 10:05 AM

## 2020-09-28 DIAGNOSIS — J1282 Pneumonia due to coronavirus disease 2019: Secondary | ICD-10-CM | POA: Diagnosis not present

## 2020-09-28 DIAGNOSIS — R110 Nausea: Secondary | ICD-10-CM | POA: Diagnosis not present

## 2020-09-28 DIAGNOSIS — U071 COVID-19: Secondary | ICD-10-CM | POA: Diagnosis not present

## 2020-11-01 DIAGNOSIS — E538 Deficiency of other specified B group vitamins: Secondary | ICD-10-CM | POA: Diagnosis not present

## 2020-11-01 DIAGNOSIS — E1151 Type 2 diabetes mellitus with diabetic peripheral angiopathy without gangrene: Secondary | ICD-10-CM | POA: Diagnosis not present

## 2020-11-08 DIAGNOSIS — D649 Anemia, unspecified: Secondary | ICD-10-CM | POA: Diagnosis not present

## 2020-11-08 DIAGNOSIS — E119 Type 2 diabetes mellitus without complications: Secondary | ICD-10-CM | POA: Diagnosis not present

## 2020-11-08 DIAGNOSIS — E876 Hypokalemia: Secondary | ICD-10-CM | POA: Diagnosis not present

## 2020-11-20 DIAGNOSIS — D649 Anemia, unspecified: Secondary | ICD-10-CM | POA: Diagnosis not present

## 2020-11-20 DIAGNOSIS — K219 Gastro-esophageal reflux disease without esophagitis: Secondary | ICD-10-CM | POA: Diagnosis not present

## 2020-11-20 DIAGNOSIS — R110 Nausea: Secondary | ICD-10-CM | POA: Diagnosis not present

## 2020-11-20 DIAGNOSIS — R634 Abnormal weight loss: Secondary | ICD-10-CM | POA: Diagnosis not present

## 2020-11-27 ENCOUNTER — Other Ambulatory Visit: Payer: Self-pay | Admitting: Internal Medicine

## 2020-11-27 ENCOUNTER — Ambulatory Visit
Admission: RE | Admit: 2020-11-27 | Discharge: 2020-11-27 | Disposition: A | Discharge status: Home / Self Care | Payer: Medicare HMO | Source: Ambulatory Visit | Attending: Internal Medicine | Admitting: Internal Medicine

## 2020-11-27 ENCOUNTER — Other Ambulatory Visit: Payer: Self-pay

## 2020-11-27 DIAGNOSIS — R921 Mammographic calcification found on diagnostic imaging of breast: Secondary | ICD-10-CM

## 2020-11-27 DIAGNOSIS — R922 Inconclusive mammogram: Secondary | ICD-10-CM | POA: Diagnosis not present

## 2020-11-28 ENCOUNTER — Encounter: Payer: Self-pay | Admitting: *Deleted

## 2020-11-29 ENCOUNTER — Ambulatory Visit: Admission: RE | Admit: 2020-11-29 | Payer: Medicare HMO | Source: Home / Self Care

## 2020-11-29 ENCOUNTER — Encounter: Payer: Self-pay | Admitting: Certified Registered"

## 2020-11-29 ENCOUNTER — Encounter: Admission: RE | Payer: Self-pay | Source: Home / Self Care

## 2020-11-29 HISTORY — DX: Peripheral vascular disease, unspecified: I73.9

## 2020-11-29 HISTORY — DX: Depression, unspecified: F32.A

## 2020-11-29 HISTORY — DX: Allergy, unspecified, initial encounter: T78.40XA

## 2020-11-29 HISTORY — DX: Anxiety disorder, unspecified: F41.9

## 2020-11-29 HISTORY — DX: Chronic obstructive pulmonary disease, unspecified: J44.9

## 2020-11-29 HISTORY — DX: Other psychoactive substance abuse, uncomplicated: F19.10

## 2020-11-29 SURGERY — ESOPHAGOGASTRODUODENOSCOPY (EGD) WITH PROPOFOL
Anesthesia: General

## 2020-11-29 MED ORDER — PHENYLEPHRINE HCL (PRESSORS) 10 MG/ML IV SOLN
INTRAVENOUS | Status: AC
  Filled 2020-11-29: qty 1

## 2020-11-29 MED ORDER — PROPOFOL 500 MG/50ML IV EMUL
INTRAVENOUS | Status: AC
  Filled 2020-11-29: qty 50

## 2020-12-06 ENCOUNTER — Encounter: Payer: Self-pay | Admitting: *Deleted

## 2020-12-07 ENCOUNTER — Ambulatory Visit
Admission: RE | Admit: 2020-12-07 | Discharge: 2020-12-07 | Disposition: A | Discharge status: Home / Self Care | Payer: Medicare HMO | Attending: Gastroenterology | Admitting: Gastroenterology

## 2020-12-07 ENCOUNTER — Encounter: Payer: Self-pay | Admitting: *Deleted

## 2020-12-07 ENCOUNTER — Encounter
Admission: RE | Disposition: A | Discharge status: Home / Self Care | Payer: Self-pay | Source: Home / Self Care | Attending: Gastroenterology

## 2020-12-07 ENCOUNTER — Ambulatory Visit: Payer: Medicare HMO | Admitting: Anesthesiology

## 2020-12-07 DIAGNOSIS — R634 Abnormal weight loss: Secondary | ICD-10-CM | POA: Diagnosis not present

## 2020-12-07 DIAGNOSIS — Z79890 Hormone replacement therapy (postmenopausal): Secondary | ICD-10-CM | POA: Diagnosis not present

## 2020-12-07 DIAGNOSIS — Z6827 Body mass index (BMI) 27.0-27.9, adult: Secondary | ICD-10-CM | POA: Insufficient documentation

## 2020-12-07 DIAGNOSIS — K635 Polyp of colon: Secondary | ICD-10-CM | POA: Diagnosis not present

## 2020-12-07 DIAGNOSIS — D122 Benign neoplasm of ascending colon: Secondary | ICD-10-CM | POA: Diagnosis not present

## 2020-12-07 DIAGNOSIS — Z7984 Long term (current) use of oral hypoglycemic drugs: Secondary | ICD-10-CM | POA: Insufficient documentation

## 2020-12-07 DIAGNOSIS — D509 Iron deficiency anemia, unspecified: Secondary | ICD-10-CM | POA: Insufficient documentation

## 2020-12-07 DIAGNOSIS — F17210 Nicotine dependence, cigarettes, uncomplicated: Secondary | ICD-10-CM | POA: Insufficient documentation

## 2020-12-07 DIAGNOSIS — K5730 Diverticulosis of large intestine without perforation or abscess without bleeding: Secondary | ICD-10-CM | POA: Diagnosis not present

## 2020-12-07 DIAGNOSIS — Z885 Allergy status to narcotic agent status: Secondary | ICD-10-CM | POA: Diagnosis not present

## 2020-12-07 DIAGNOSIS — K3189 Other diseases of stomach and duodenum: Secondary | ICD-10-CM | POA: Insufficient documentation

## 2020-12-07 DIAGNOSIS — K649 Unspecified hemorrhoids: Secondary | ICD-10-CM | POA: Diagnosis not present

## 2020-12-07 DIAGNOSIS — K6389 Other specified diseases of intestine: Secondary | ICD-10-CM | POA: Insufficient documentation

## 2020-12-07 DIAGNOSIS — K621 Rectal polyp: Secondary | ICD-10-CM | POA: Insufficient documentation

## 2020-12-07 DIAGNOSIS — R112 Nausea with vomiting, unspecified: Secondary | ICD-10-CM | POA: Diagnosis not present

## 2020-12-07 DIAGNOSIS — Z79899 Other long term (current) drug therapy: Secondary | ICD-10-CM | POA: Insufficient documentation

## 2020-12-07 DIAGNOSIS — K2970 Gastritis, unspecified, without bleeding: Secondary | ICD-10-CM | POA: Diagnosis not present

## 2020-12-07 DIAGNOSIS — K644 Residual hemorrhoidal skin tags: Secondary | ICD-10-CM | POA: Insufficient documentation

## 2020-12-07 DIAGNOSIS — D649 Anemia, unspecified: Secondary | ICD-10-CM | POA: Diagnosis not present

## 2020-12-07 DIAGNOSIS — K222 Esophageal obstruction: Secondary | ICD-10-CM | POA: Diagnosis not present

## 2020-12-07 DIAGNOSIS — K449 Diaphragmatic hernia without obstruction or gangrene: Secondary | ICD-10-CM | POA: Diagnosis not present

## 2020-12-07 DIAGNOSIS — K2980 Duodenitis without bleeding: Secondary | ICD-10-CM | POA: Diagnosis not present

## 2020-12-07 DIAGNOSIS — K319 Disease of stomach and duodenum, unspecified: Secondary | ICD-10-CM | POA: Insufficient documentation

## 2020-12-07 DIAGNOSIS — Z7982 Long term (current) use of aspirin: Secondary | ICD-10-CM | POA: Insufficient documentation

## 2020-12-07 DIAGNOSIS — F419 Anxiety disorder, unspecified: Secondary | ICD-10-CM | POA: Diagnosis not present

## 2020-12-07 HISTORY — PX: ESOPHAGOGASTRODUODENOSCOPY: SHX5428

## 2020-12-07 HISTORY — PX: COLONOSCOPY WITH PROPOFOL: SHX5780

## 2020-12-07 LAB — GLUCOSE, CAPILLARY: Glucose-Capillary: 114 mg/dL — ABNORMAL HIGH (ref 70–99)

## 2020-12-07 SURGERY — COLONOSCOPY WITH PROPOFOL
Anesthesia: General

## 2020-12-07 MED ORDER — DEXMEDETOMIDINE HCL IN NACL 200 MCG/50ML IV SOLN
INTRAVENOUS | Status: DC | PRN
  Administered 2020-12-07: 12 ug via INTRAVENOUS
  Administered 2020-12-07: 8 ug via INTRAVENOUS

## 2020-12-07 MED ORDER — PROPOFOL 10 MG/ML IV BOLUS
INTRAVENOUS | Status: AC
  Filled 2020-12-07: qty 20

## 2020-12-07 MED ORDER — SODIUM CHLORIDE 0.9 % IV SOLN: INTRAVENOUS | Status: DC

## 2020-12-07 MED ORDER — PROPOFOL 500 MG/50ML IV EMUL
INTRAVENOUS | Status: AC
  Filled 2020-12-07: qty 50

## 2020-12-07 MED ORDER — LIDOCAINE HCL (CARDIAC) PF 100 MG/5ML IV SOSY
PREFILLED_SYRINGE | INTRAVENOUS | Status: DC | PRN
  Administered 2020-12-07: 50 mg via INTRAVENOUS

## 2020-12-07 MED ORDER — PROPOFOL 10 MG/ML IV BOLUS
INTRAVENOUS | Status: DC | PRN
  Administered 2020-12-07: 100 mg via INTRAVENOUS

## 2020-12-07 MED ORDER — PROPOFOL 500 MG/50ML IV EMUL
INTRAVENOUS | Status: DC | PRN
  Administered 2020-12-07: 150 ug/kg/min via INTRAVENOUS

## 2020-12-07 MED ORDER — PHENYLEPHRINE HCL (PRESSORS) 10 MG/ML IV SOLN
INTRAVENOUS | Status: DC | PRN
  Administered 2020-12-07: 50 ug via INTRAVENOUS
  Administered 2020-12-07: 100 ug via INTRAVENOUS
  Administered 2020-12-07: 50 ug via INTRAVENOUS

## 2020-12-07 NOTE — Interval H&P Note (Signed)
History and Physical Interval Note: Preprocedure H&P from 12/07/20  was reviewed and there was no interval change after seeing and examining the patient.  Written consent was obtained from the patient after discussion of risks, benefits, and alternatives. Patient has consented to proceed with Esophagogastroduodenoscopy and Colonoscopy with possible intervention   12/07/2020 12:38 PM  Misty Adams  has presented today for surgery, with the diagnosis of anemia.  The various methods of treatment have been discussed with the patient and family. After consideration of risks, benefits and other options for treatment, the patient has consented to  Procedure(s): COLONOSCOPY WITH PROPOFOL (N/A) ESOPHAGOGASTRODUODENOSCOPY (EGD) (N/A) as a surgical intervention.  The patient's history has been reviewed, patient examined, no change in status, stable for surgery.  I have reviewed the patient's chart and labs.  Questions were answered to the patient's satisfaction.     Annamaria Helling

## 2020-12-07 NOTE — Op Note (Signed)
[facility] Patient Name: Misty Adams Procedure Date: 12/07/2020 12:33 PM MRN: [id number] Account #: [id number] Date of Birth: [birth year] Admit Type: Outpatient Age: 65 Room: [facility] ENDO ROOM 1 Gender: Female Note Status: Finalized Procedure:             Upper GI endoscopy Indications:           Iron deficiency anemia, Weight loss Providers:             Rueben Bash, DO Referring MD:          Rusty Aus, MD (Referring MD) Medicines:             Monitored Anesthesia Care Complications:         No immediate complications. Estimated blood loss:                         Minimal. Procedure:             Pre-Anesthesia Assessment:                        - Prior to the procedure, a History and Physical was                         performed, and patient medications and allergies were                         reviewed. The patient is competent. The risks and                         benefits of the procedure and the sedation options and                         risks were discussed with the patient. All questions                         were answered and informed consent was obtained.                         Patient identification and proposed procedure were                         verified by the physician, the nurse, the anesthetist                         and the technician in the endoscopy suite. Mental                         Status Examination: alert and oriented. Airway                         Examination: normal oropharyngeal airway and neck                         mobility. Respiratory Examination: clear to                         auscultation. CV Examination: RRR, no murmurs, no S3  or S4. Prophylactic Antibiotics: The patient does not                         require prophylactic antibiotics. Prior                         Anticoagulants: The patient has taken no previous                         anticoagulant or  antiplatelet agents. ASA Grade                         Assessment: III - A patient with severe systemic                         disease. After reviewing the risks and benefits, the                         patient was deemed in satisfactory condition to                         undergo the procedure. The anesthesia plan was to use                         monitored anesthesia care (MAC). Immediately prior to                         administration of medications, the patient was                         re-assessed for adequacy to receive sedatives. The                         heart rate, respiratory rate, oxygen saturations,                         blood pressure, adequacy of pulmonary ventilation, and                         response to care were monitored throughout the                         procedure. The physical status of the patient was                         re-assessed after the procedure.                        After obtaining informed consent, the endoscope was                         passed under direct vision. Throughout the procedure,                         the patient's blood pressure, pulse, and oxygen                         saturations were monitored continuously. The Endoscope  was introduced through the mouth, and advanced to the                         second part of duodenum. The upper GI endoscopy was                         accomplished without difficulty. The patient tolerated                         the procedure well. Findings:      Esophagogastric landmarks were identified: the Z-line was found at 35 cm       from the incisors. Small hiatal hernia noted. Schatski's ring noted,       Biopsy of ring (not collected) to break ring with cold forceps. minimal       blood loss      The exam of the esophagus was otherwise normal.      Localized mild inflammation characterized by erythema was found in the       stomach. Biopsies were taken with a  cold forceps for histology.       Estimated blood loss was minimal.      Localized mild inflammation characterized by erosions was found in the       duodenal bulb. Biopsies were taken with a cold forceps for histology.       Estimated blood loss was minimal.      The exam of the duodenum was otherwise normal. Impression:            - Esophagogastric landmarks identified.                        - Gastritis. Biopsied.                        - Duodenitis. Biopsied. Recommendation:        - Discharge patient to home.                        - Resume previous diet.                        - Continue present medications.                        - Await pathology results.                        - Return to referring physician as previously                         scheduled. Procedure Code(s):     --- Professional ---                        [phone], Esophagogastroduodenoscopy, flexible,                         transoral; with biopsy, single or multiple Diagnosis Code(s):     --- Professional ---                        K29.70, Gastritis, unspecified, without bleeding  K29.80, Duodenitis without bleeding                        D50.9, Iron deficiency anemia, unspecified                        R63.4, Abnormal weight loss CPT copyright 2019 American Medical Association. All rights reserved. The codes documented in this report are preliminary and upon coder review may  be revised to meet current compliance requirements. Attending Participation:      I personally performed the entire procedure. Volney American, DO Annamaria Helling DO, DO 12/07/2020 1:01:35 PM This report has been signed electronically. Number of Addenda: 0 Note Initiated On: 12/07/2020 12:33 PM Estimated Blood Loss:  Estimated blood loss was minimal.      [facility]

## 2020-12-07 NOTE — Transfer of Care (Signed)
Immediate Anesthesia Transfer of Care Note  Patient: Misty Adams  Procedure(s) Performed: COLONOSCOPY WITH PROPOFOL ESOPHAGOGASTRODUODENOSCOPY (EGD)  Patient Location: PACU and Endoscopy Unit  Anesthesia Type:General  Level of Consciousness: drowsy and patient cooperative  Airway & Oxygen Therapy: Patient Spontanous Breathing  Post-op Assessment: Report given to RN and Post -op Vital signs reviewed and stable  Post vital signs: Reviewed and stable  Last Vitals:  Vitals Value Taken Time  BP 97/56 12/07/20 1337  Temp    Pulse 65 12/07/20 1337  Resp 25 12/07/20 1337  SpO2 97 % 12/07/20 1337  Vitals shown include unvalidated device data.  Last Pain:  Vitals:   12/07/20 1157  TempSrc: Temporal  PainSc: 0-No pain         Complications: No notable events documented.

## 2020-12-07 NOTE — Anesthesia Postprocedure Evaluation (Signed)
Anesthesia Post Note  Patient: Misty Adams  Procedure(s) Performed: COLONOSCOPY WITH PROPOFOL ESOPHAGOGASTRODUODENOSCOPY (EGD)  Patient location during evaluation: Phase II Anesthesia Type: General Level of consciousness: awake and alert, awake and oriented Pain management: pain level controlled Vital Signs Assessment: post-procedure vital signs reviewed and stable Respiratory status: spontaneous breathing, nonlabored ventilation and respiratory function stable Cardiovascular status: blood pressure returned to baseline and stable Postop Assessment: no apparent nausea or vomiting Anesthetic complications: no   No notable events documented.   Last Vitals:  Vitals:   12/07/20 1348 12/07/20 1354  BP: (!) 107/51 126/66  Pulse:    Resp: 20 16  Temp:    SpO2:  100%    Last Pain:  Vitals:   12/07/20 1348  TempSrc:   PainSc: 0-No pain                 Phill Mutter

## 2020-12-07 NOTE — H&P (Signed)
[facility] Pre-Procedure H&P   Patient ID: ONI ORE is a 65 y.o. female.  Gastroenterology Provider: Annamaria Helling, DO  Referring Provider: Laurine Blazer, PA PCP: Rusty Aus, MD  Date: 12/07/2020  HPI Ms. Misty Adams is a 65 y.o. female who presents today for Esophagogastroduodenoscopy and Colonoscopy for for abnormal weight loss, anemia (iron deficiency), nausea..  Has gone from 208 to 159 lbs over the last year. Has had early satiety for 4-6 months, but this has improved. Has had nausea and vomiting that is slightly improved. Issues not necessarily associated with meal timing. No dysphagia/odynophagia. Found to have iron deficiency anemia. No hematemesis, coffee ground emesis, melena, or hematochezia.  BM have been regular. Has had history of colon polyps- last colonoscopy April 2019. No reflux (on protonix), dysphagia or odynophagia. Tobacco 2ppd for 50 years   Past Medical History:  Diagnosis Date   Allergy    Anxiety    CHF (congestive heart failure) (HCC)    COPD (chronic obstructive pulmonary disease) (HCC)    Depression    Diabetes mellitus without complication (HCC)    Hyperlipidemia    Hypertension    Hypothyroidism    PAD (peripheral artery disease) ([place])    Psoriasis    Substance abuse ([place])    Tobacco abuse     Past Surgical History:  Procedure Laterality Date   ABDOMINAL HYSTERECTOMY     AORTIC ARCH ANGIOGRAPHY N/A 08/06/2017   Procedure: AORTIC ARCH ANGIOGRAPHY;  Surgeon: Conrad [place], MD;  Location: [facility];  Service: Cardiovascular;  Laterality: N/A;   APPENDECTOMY     CESAREAN SECTION     COLONOSCOPY     dilatation and curettage     DILATION AND CURETTAGE OF UTERUS     KNEE SURGERY     left wrist ganglion cyst removal     OOPHORECTOMY  1994   PERIPHERAL VASCULAR INTERVENTION Left 08/06/2017   Procedure: PERIPHERAL VASCULAR INTERVENTION;  Surgeon: Conrad [place], MD;  Location: [facility];   Service: Cardiovascular;  Laterality: Left;  subclavian   TUBAL LIGATION      Family History Sister- colon polyps No other h/o GI disease or malignancy  Review of Systems  Constitutional:  Positive for unexpected weight change. Negative for activity change, appetite change, fatigue and fever.  HENT:  Negative for trouble swallowing and voice change.   Respiratory:  Negative for shortness of breath and wheezing.   Cardiovascular:  Negative for chest pain and palpitations.  Gastrointestinal:  Positive for nausea and vomiting. Negative for abdominal distention, abdominal pain, anal bleeding, blood in stool, constipation, diarrhea and rectal pain.       + early satiety  Musculoskeletal:  Negative for arthralgias and myalgias.  Skin:  Negative for color change and pallor.  Neurological:  Negative for dizziness, syncope and weakness.  Psychiatric/Behavioral:  Negative for confusion.   All other systems reviewed and are negative.   Medications No current facility-administered medications on file prior to encounter.   Current Outpatient Medications on File Prior to Encounter  Medication Sig Dispense Refill   albuterol (PROVENTIL HFA;VENTOLIN HFA) 108 (90 Base) MCG/ACT inhaler Inhale 2 puffs into the lungs every 6 (six) hours as needed for wheezing or shortness of breath. 1 Inhaler 0   acetaminophen (TYLENOL) 500 MG tablet Take 1,000 mg by mouth every 6 (six) hours as needed for moderate pain or headache.     ALPRAZolam (XANAX) 0.5 MG tablet Take 0.5 mg  by mouth 2 (two) times daily as needed for anxiety.      ascorbic acid (VITAMIN C) 500 MG tablet Take 1 tablet (500 mg total) by mouth daily. 20 tablet 0   aspirin 81 MG tablet Take 81 mg by mouth at bedtime.      atorvastatin (LIPITOR) 40 MG tablet Take 1 tablet (40 mg total) by mouth at bedtime. 30 tablet 0   Cholecalciferol 1.25 MG (50000 UT) capsule Take 50,000 Units by mouth daily.     furosemide (LASIX) 20 MG tablet Take 2 tablets (40  mg total) by mouth daily as needed for fluid. 30 tablet 0   guaiFENesin-dextromethorphan (ROBITUSSIN DM) 100-10 MG/5ML syrup Take 10 mLs by mouth every 4 (four) hours as needed for cough. 118 mL 0   hydrochlorothiazide (HYDRODIURIL) 25 MG tablet Take 25 mg by mouth daily.     ibuprofen (ADVIL) 800 MG tablet Take 800 mg by mouth every 8 (eight) hours as needed.     levothyroxine (SYNTHROID, LEVOTHROID) 125 MCG tablet Take 125 mcg by mouth daily.      metFORMIN (GLUCOPHAGE-XR) 500 MG 24 hr tablet Take 500 mg by mouth 2 (two) times daily with a meal.     Multiple Vitamin (MULTIVITAMIN WITH MINERALS) TABS tablet Take 1 tablet by mouth daily. 30 tablet 0   ondansetron (ZOFRAN) 4 MG tablet Take 4 mg by mouth every 4 (four) hours as needed for nausea or vomiting.     pantoprazole (PROTONIX) 40 MG tablet Take 40 mg by mouth daily.      potassium chloride SA (KLOR-CON) 20 MEQ tablet Take 2 tablets (40 mEq total) by mouth 2 (two) times daily as needed (Take potasium tablets twice a day when you take lasix.). 120 tablet 0   spironolactone (ALDACTONE) 50 MG tablet Take 50 mg by mouth daily.     temazepam (RESTORIL) 15 MG capsule Take 15 mg by mouth at bedtime as needed for sleep.     traZODone (DESYREL) 100 MG tablet Take 100 mg by mouth daily as needed for sleep.     venlafaxine XR (EFFEXOR-XR) 37.5 MG 24 hr capsule Take 37.5 mg by mouth daily with breakfast.     zinc sulfate 220 (50 Zn) MG capsule Take 1 capsule (220 mg total) by mouth daily. 30 capsule 0    Pertinent medications related to GI and procedure were reviewed by me with the patient prior to the procedure   Current Facility-Administered Medications:    0.9 %  sodium chloride infusion, , Intravenous, Continuous, Annamaria Helling, DO, Last Rate: 20 mL/hr at 12/07/20 1214, New Bag at 12/07/20 1214      Allergies  Allergen Reactions   Hydrocodone-Acetaminophen Hives   Vicodin [Hydrocodone-Acetaminophen] Hives   Allergies were reviewed  by me prior to the procedure  Objective    Vitals:   12/07/20 1157  BP: (!) 158/67  Pulse: 78  Resp: 16  Temp: (!) 97.3 F (36.3 C)  TempSrc: Temporal  SpO2: 99%  Weight: 70.3 kg  Height: '5\' 3"'$  (1.6 m)    Physical Exam Vitals reviewed.  Constitutional:      General: She is not in acute distress.    Appearance: Normal appearance. She is normal weight. She is not ill-appearing, toxic-appearing or diaphoretic.  HENT:     Head: Normocephalic and atraumatic.     Nose: Nose normal.     Mouth/Throat:     Mouth: Mucous membranes are moist.     Pharynx: Oropharynx  is clear.  Eyes:     General: No scleral icterus.    Extraocular Movements: Extraocular movements intact.  Cardiovascular:     Rate and Rhythm: Normal rate and regular rhythm.     Heart sounds: Normal heart sounds. No murmur heard.   No friction rub. No gallop.  Pulmonary:     Effort: Pulmonary effort is normal. No respiratory distress.     Breath sounds: Normal breath sounds. No wheezing, rhonchi or rales.  Abdominal:     General: Abdomen is flat. Bowel sounds are normal. There is no distension.     Palpations: Abdomen is soft.     Tenderness: There is no abdominal tenderness. There is no guarding or rebound.  Musculoskeletal:     Cervical back: Neck supple.     Right lower leg: No edema.     Left lower leg: No edema.  Skin:    General: Skin is warm and dry.     Coloration: Skin is not jaundiced or pale.  Neurological:     General: No focal deficit present.     Mental Status: She is alert and oriented to person, place, and time. Mental status is at baseline.  Psychiatric:        Mood and Affect: Mood normal.        Behavior: Behavior normal.        Thought Content: Thought content normal.        Judgment: Judgment normal.     Assessment:  Ms. Misty Adams is a 65 y.o. female  who presents today for Esophagogastroduodenoscopy and Colonoscopy for abnormal weight loss, anemia (iron deficiency),  nausea.  Plan:  Esophagogastroduodenoscopy and Colonoscopy with possible intervention today  Esophagogastroduodenoscopy with possible biopsy, control of bleeding, polypectomy, and interventions as necessary has been discussed with the patient/patient representative. Informed consent was obtained from the patient/patient representative after explaining the indication, nature, and risks of the procedure including but not limited to death, bleeding, perforation, missed neoplasm/lesions, cardiorespiratory compromise, and reaction to medications. Opportunity for questions was given and appropriate answers were provided. Patient/patient representative has verbalized understanding is amenable to undergoing the procedure.  Colonoscopy with possible biopsy, control of bleeding, polypectomy, and interventions as necessary has been discussed with the patient/patient representative. Informed consent was obtained from the patient/patient representative after explaining the indication, nature, and risks of the procedure including but not limited to death, bleeding, perforation, missed neoplasm/lesions, cardiorespiratory compromise, and reaction to medications. Opportunity for questions was given and appropriate answers were provided. Patient/patient representative has verbalized understanding is amenable to undergoing the procedure.  Annamaria Helling, DO  [facility]  Portions of the record may have been created with voice recognition software. Occasional wrong-word or 'sound-a-like' substitutions may have occurred due to the inherent limitations of voice recognition software.  Read the chart carefully and recognize, using context, where substitutions may have occurred.

## 2020-12-07 NOTE — Op Note (Signed)
[facility] Patient Name: Misty Adams Procedure Date: 12/07/2020 12:26 PM MRN: [id number] Account #: [id number] Date of Birth: [birth year] Admit Type: Outpatient Age: 65 Room: [facility] ENDO ROOM 1 Gender: Female Note Status: Finalized Procedure:             Colonoscopy Indications:           Iron deficiency anemia, Weight loss Providers:             Rueben Bash, DO Referring MD:          Rusty Aus, MD (Referring MD) Medicines:             Monitored Anesthesia Care Complications:         No immediate complications. Estimated blood loss:                         Minimal. Procedure:             Pre-Anesthesia Assessment:                        - Prior to the procedure, a History and Physical was                         performed, and patient medications and allergies were                         reviewed. The patient is competent. The risks and                         benefits of the procedure and the sedation options and                         risks were discussed with the patient. All questions                         were answered and informed consent was obtained.                         Patient identification and proposed procedure were                         verified by the physician, the nurse, the anesthetist                         and the technician in the endoscopy suite. Mental                         Status Examination: alert and oriented. Airway                         Examination: normal oropharyngeal airway and neck                         mobility. Respiratory Examination: clear to                         auscultation. CV Examination: RRR, no murmurs, no S3  or S4. Prophylactic Antibiotics: The patient does not                         require prophylactic antibiotics. Prior                         Anticoagulants: The patient has taken no previous                         anticoagulant or  antiplatelet agents. ASA Grade                         Assessment: III - A patient with severe systemic                         disease. After reviewing the risks and benefits, the                         patient was deemed in satisfactory condition to                         undergo the procedure. The anesthesia plan was to use                         monitored anesthesia care (MAC). Immediately prior to                         administration of medications, the patient was                         re-assessed for adequacy to receive sedatives. The                         heart rate, respiratory rate, oxygen saturations,                         blood pressure, adequacy of pulmonary ventilation, and                         response to care were monitored throughout the                         procedure. The physical status of the patient was                         re-assessed after the procedure.                        After obtaining informed consent, the colonoscope was                         passed under direct vision. Throughout the procedure,                         the patient's blood pressure, pulse, and oxygen                         saturations were monitored continuously. The  Colonoscope was introduced through the anus and                         advanced to the the cecum, identified by appendiceal                         orifice and ileocecal valve. The colonoscopy was                         performed without difficulty. The patient tolerated                         the procedure well. The quality of the bowel                         preparation was evaluated using the BBPS [facility] Bowel                         Preparation Scale) with scores of: Right Colon = 2                         (minor amount of residual staining, small fragments of                         stool and/or opaque liquid, but mucosa seen well),                         Transverse Colon =  3 (entire mucosa seen well with no                         residual staining, small fragments of stool or opaque                         liquid) and Left Colon = 3 (entire mucosa seen well                         with no residual staining, small fragments of stool or                         opaque liquid). The total BBPS score equals 8. The                         quality of the bowel preparation was excellent. The                         ileocecal valve, appendiceal orifice, and rectum were                         photographed. Findings:      Skin tags were found on perianal exam.      The digital rectal exam was normal. Pertinent negatives include normal       sphincter tone.      A few small-mouthed diverticula were found in the left colon. Estimated       blood loss: none.      A 6 mm polyp was found in the transverse colon. The polyp was sessile.  The polyp was removed with a cold snare. Resection and retrieval were       complete. Estimated blood loss was minimal.      Three sessile polyps were found in the rectum, transverse colon and       ascending colon. The polyps were 2 to 3 mm in size. These polyps were       removed with a cold biopsy forceps. Resection and retrieval were       complete. Estimated blood loss was minimal. Estimated blood loss was       minimal.      The exam was otherwise without abnormality on direct and retroflexion       views. Impression:            - Perianal skin tags found on perianal exam.                        - Diverticulosis in the left colon.                        - One 6 mm polyp in the transverse colon, removed with                         a cold snare. Resected and retrieved.                        - Three 2 to 3 mm polyps in the rectum, in the                         transverse colon and in the ascending colon, removed                         with a cold biopsy forceps. Resected and retrieved.                        - The examination  was otherwise normal on direct and                         retroflexion views. Recommendation:        - Discharge patient to home.                        - Resume previous diet.                        - Continue present medications.                        - Await pathology results.                        - Repeat colonoscopy date to be determined after                         pending pathology results are reviewed for                         surveillance based on pathology results.                        -  Return to referring physician as previously                         scheduled. Procedure Code(s):     --- Professional ---                        [phone], Colonoscopy, flexible; with removal of                         tumor(s), polyp(s), or other lesion(s) by snare                         technique                        45380, 11, Colonoscopy, flexible; with biopsy, single                         or multiple Diagnosis Code(s):     --- Professional ---                        K63.5, Polyp of colon                        K62.1, Rectal polyp                        K64.4, Residual hemorrhoidal skin tags                        D50.9, Iron deficiency anemia, unspecified                        R63.4, Abnormal weight loss                        K57.30, Diverticulosis of large intestine without                         perforation or abscess without bleeding CPT copyright 2019 American [employer] Association. All rights reserved. The codes documented in this report are preliminary and upon coder review may  be revised to meet current compliance requirements. Attending Participation:      I personally performed the entire procedure. Volney American, DO Annamaria Helling DO, DO 12/07/2020 1:39:39 PM This report has been signed electronically. Number of Addenda: 0 Note Initiated On: 12/07/2020 12:26 PM Scope Withdrawal Time: 0 hours 18 minutes 6 seconds  Total Procedure Duration: 0 hours 27  minutes 26 seconds  Estimated Blood Loss:  Estimated blood loss was minimal.      [facility]

## 2020-12-07 NOTE — Anesthesia Preprocedure Evaluation (Signed)
Anesthesia Evaluation  Patient identified by MRN, date of birth, ID band Patient awake    Reviewed: Allergy & Precautions, NPO status , Patient's Chart, lab work & pertinent test results  History of Anesthesia Complications Negative for: history of anesthetic complications  Airway Mallampati: III  TM Distance: >3 FB Neck ROM: full    Dental  (+) Upper Dentures, Lower Dentures   Pulmonary COPD, Current Smoker and Patient abstained from smoking.,    Pulmonary exam normal        Cardiovascular Exercise Tolerance: Good hypertension, (-) angina+ Peripheral Vascular Disease and +CHF  Normal cardiovascular exam     Neuro/Psych PSYCHIATRIC DISORDERS negative neurological ROS  negative psych ROS   GI/Hepatic negative GI ROS, Neg liver ROS,   Endo/Other  diabetes, Type 2Hypothyroidism   Renal/GU negative Renal ROS  negative genitourinary   Musculoskeletal   Abdominal   Peds  Hematology negative hematology ROS (+)   Anesthesia Other Findings Past Medical History: No date: Allergy No date: Anxiety No date: CHF (congestive heart failure) (HCC) No date: COPD (chronic obstructive pulmonary disease) (HCC) No date: Depression No date: Diabetes mellitus without complication (HCC) No date: Hyperlipidemia No date: Hypertension No date: Hypothyroidism No date: PAD (peripheral artery disease) (HCC) No date: Psoriasis No date: Substance abuse ([place]) No date: Tobacco abuse  Past Surgical History: No date: ABDOMINAL HYSTERECTOMY 08/06/2017: AORTIC ARCH ANGIOGRAPHY; N/A     Comment:  Procedure: AORTIC ARCH ANGIOGRAPHY;  Surgeon: Conrad [place], MD;  Location: [place] [facility];  Service:               Cardiovascular;  Laterality: N/A; No date: APPENDECTOMY No date: CESAREAN SECTION No date: COLONOSCOPY No date: dilatation and curettage No date: DILATION AND CURETTAGE OF UTERUS No date: KNEE SURGERY No date:  left wrist ganglion cyst removal 1994: OOPHORECTOMY 08/06/2017: PERIPHERAL VASCULAR INTERVENTION; Left     Comment:  Procedure: PERIPHERAL VASCULAR INTERVENTION;  Surgeon:               Conrad [place], MD;  Location: [place] [facility];                Service: Cardiovascular;  Laterality: Left;  subclavian No date: TUBAL LIGATION  BMI    Body Mass Index: 27.46 kg/m      Reproductive/Obstetrics negative OB ROS                             Anesthesia Physical Anesthesia Plan  ASA: 3  Anesthesia Plan: General   Post-op Pain Management:    Induction: Intravenous  PONV Risk Score and Plan: Propofol infusion and TIVA  Airway Management Planned: Natural Airway and Nasal Cannula  Additional Equipment:   Intra-op Plan:   Post-operative Plan:   Informed Consent: I have reviewed the patients History and Physical, chart, labs and discussed the procedure including the risks, benefits and alternatives for the proposed anesthesia with the patient or authorized representative who has indicated his/her understanding and acceptance.     Dental Advisory Given  Plan Discussed with: Anesthesiologist, CRNA and Surgeon  Anesthesia Plan Comments: (Patient consented for risks of anesthesia including but not limited to:  - adverse reactions to medications - risk of airway placement if required - damage to eyes, teeth, lips or other oral mucosa - nerve damage due to positioning  - sore throat  or hoarseness - Damage to heart, brain, nerves, lungs, other parts of body or loss of life  Patient voiced understanding.)        Anesthesia Quick Evaluation

## 2020-12-10 ENCOUNTER — Encounter: Payer: Self-pay | Admitting: Gastroenterology

## 2020-12-11 LAB — SURGICAL PATHOLOGY

## 2021-03-04 DIAGNOSIS — J441 Chronic obstructive pulmonary disease with (acute) exacerbation: Secondary | ICD-10-CM | POA: Diagnosis not present

## 2021-03-04 DIAGNOSIS — H109 Unspecified conjunctivitis: Secondary | ICD-10-CM | POA: Diagnosis not present

## 2021-03-19 DIAGNOSIS — E1151 Type 2 diabetes mellitus with diabetic peripheral angiopathy without gangrene: Secondary | ICD-10-CM | POA: Diagnosis not present

## 2021-03-19 DIAGNOSIS — D500 Iron deficiency anemia secondary to blood loss (chronic): Secondary | ICD-10-CM | POA: Diagnosis not present

## 2021-03-19 DIAGNOSIS — E538 Deficiency of other specified B group vitamins: Secondary | ICD-10-CM | POA: Diagnosis not present

## 2021-03-26 DIAGNOSIS — E1151 Type 2 diabetes mellitus with diabetic peripheral angiopathy without gangrene: Secondary | ICD-10-CM | POA: Diagnosis not present

## 2021-03-26 DIAGNOSIS — Z0000 Encounter for general adult medical examination without abnormal findings: Secondary | ICD-10-CM | POA: Diagnosis not present

## 2021-03-26 DIAGNOSIS — Z23 Encounter for immunization: Secondary | ICD-10-CM | POA: Diagnosis not present

## 2021-05-31 ENCOUNTER — Ambulatory Visit
Admission: RE | Admit: 2021-05-31 | Discharge: 2021-05-31 | Disposition: A | Discharge status: Home / Self Care | Payer: Medicare HMO | Source: Ambulatory Visit | Attending: Internal Medicine | Admitting: Internal Medicine

## 2021-05-31 DIAGNOSIS — R922 Inconclusive mammogram: Secondary | ICD-10-CM | POA: Diagnosis not present

## 2021-05-31 DIAGNOSIS — R921 Mammographic calcification found on diagnostic imaging of breast: Secondary | ICD-10-CM

## 2021-07-10 DIAGNOSIS — E113293 Type 2 diabetes mellitus with mild nonproliferative diabetic retinopathy without macular edema, bilateral: Secondary | ICD-10-CM | POA: Diagnosis not present

## 2021-08-02 DIAGNOSIS — D500 Iron deficiency anemia secondary to blood loss (chronic): Secondary | ICD-10-CM | POA: Diagnosis not present

## 2021-08-02 DIAGNOSIS — E1151 Type 2 diabetes mellitus with diabetic peripheral angiopathy without gangrene: Secondary | ICD-10-CM | POA: Diagnosis not present

## 2021-08-09 DIAGNOSIS — Z1389 Encounter for screening for other disorder: Secondary | ICD-10-CM | POA: Diagnosis not present

## 2021-08-09 DIAGNOSIS — N183 Chronic kidney disease, stage 3 unspecified: Secondary | ICD-10-CM | POA: Diagnosis not present

## 2021-08-09 DIAGNOSIS — J449 Chronic obstructive pulmonary disease, unspecified: Secondary | ICD-10-CM | POA: Diagnosis not present

## 2021-08-09 DIAGNOSIS — Z0000 Encounter for general adult medical examination without abnormal findings: Secondary | ICD-10-CM | POA: Diagnosis not present

## 2021-08-09 DIAGNOSIS — F330 Major depressive disorder, recurrent, mild: Secondary | ICD-10-CM | POA: Insufficient documentation

## 2021-08-09 DIAGNOSIS — E1122 Type 2 diabetes mellitus with diabetic chronic kidney disease: Secondary | ICD-10-CM | POA: Diagnosis not present

## 2021-08-09 DIAGNOSIS — E119 Type 2 diabetes mellitus without complications: Secondary | ICD-10-CM | POA: Diagnosis not present

## 2021-08-09 DIAGNOSIS — I700 Atherosclerosis of aorta: Secondary | ICD-10-CM | POA: Diagnosis not present

## 2021-08-09 DIAGNOSIS — F32A Depression, unspecified: Secondary | ICD-10-CM | POA: Diagnosis not present

## 2021-09-04 DIAGNOSIS — M8588 Other specified disorders of bone density and structure, other site: Secondary | ICD-10-CM | POA: Diagnosis not present

## 2021-09-11 ENCOUNTER — Ambulatory Visit (INDEPENDENT_AMBULATORY_CARE_PROVIDER_SITE_OTHER): Payer: Medicare HMO | Admitting: Dermatology

## 2021-09-11 DIAGNOSIS — D229 Melanocytic nevi, unspecified: Secondary | ICD-10-CM

## 2021-09-11 DIAGNOSIS — L814 Other melanin hyperpigmentation: Secondary | ICD-10-CM

## 2021-09-11 DIAGNOSIS — L304 Erythema intertrigo: Secondary | ICD-10-CM | POA: Diagnosis not present

## 2021-09-11 DIAGNOSIS — L821 Other seborrheic keratosis: Secondary | ICD-10-CM | POA: Diagnosis not present

## 2021-09-11 DIAGNOSIS — L578 Other skin changes due to chronic exposure to nonionizing radiation: Secondary | ICD-10-CM

## 2021-09-11 DIAGNOSIS — L409 Psoriasis, unspecified: Secondary | ICD-10-CM

## 2021-09-11 DIAGNOSIS — R238 Other skin changes: Secondary | ICD-10-CM

## 2021-09-11 DIAGNOSIS — Z1283 Encounter for screening for malignant neoplasm of skin: Secondary | ICD-10-CM | POA: Diagnosis not present

## 2021-09-11 DIAGNOSIS — D1800 Hemangioma unspecified site: Secondary | ICD-10-CM | POA: Diagnosis not present

## 2021-09-11 MED ORDER — KETOCONAZOLE 2 % EX CREA: TOPICAL_CREAM | CUTANEOUS | 6 refills | Status: DC

## 2021-09-11 MED ORDER — HYDROCORTISONE 2.5 % EX CREA: TOPICAL_CREAM | CUTANEOUS | 6 refills | Status: DC

## 2021-09-11 NOTE — Patient Instructions (Signed)

## 2021-09-11 NOTE — Progress Notes (Signed)
New Patient Visit  Subjective  Misty Adams is a 66 y.o. female who presents for the following: Annual Exam (Mole check ). The patient presents for Total-Body Skin Exam (TBSE) for skin cancer screening and mole check.  The patient has spots, moles and lesions to be evaluated, some may be new or changing and the patient has concerns that these could be cancer.  Pt report long hx of Psoriasis on her hands and feet, doing better now.  The following portions of the chart were reviewed this encounter and updated as appropriate:   Tobacco  Allergies  Meds  Problems  Med Hx  Surg Hx  Fam Hx     Review of Systems:  No other skin or systemic complaints except as noted in HPI or Assessment and Plan.  Objective  Well appearing patient in no apparent distress; mood and affect are within normal limits.  A full examination was performed including scalp, head, eyes, ears, nose, lips, neck, chest, axillae, abdomen, back, buttocks, bilateral upper extremities, bilateral lower extremities, hands, feet, fingers, toes, fingernails, and toenails. All findings within normal limits unless otherwise noted below.  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 It is associated with an increased risk of inflammatory bowel disease, heart disease, non-alcoholic fatty liver disease, and depression.     Discussed treatment options, pt decline treatment   Venous lake right ear Benign-appearing.  Observation.  Call clinic for new or changing lesions.  Recommend daily use of broad spectrum spf 30+ sunscreen to sun-exposed areas.  The patient will  observe these symptoms, and report promptly any worsening or unexpected persistence.  If well, may return prn.   Erythema intertrigo groin, inframammary  Intertrigo is a chronic recurrent rash that occurs in skin fold areas that may be associated with friction; heat; moisture; yeast; fungus; and bacteria.  It is exacerbated by increased movement / activity; sweating; and higher atmospheric temperature.    Start Ketoconazole cream apply at AA at bedtime Mon,Wed,Fri  Start Hydrocortisone cream apply at AA at bedtime Tue,Thurs,Sat  Related Medications ketoconazole (NIZORAL) 2 % cream Apply to affected skin at bedtime Mon, Wed, Fri  hydrocortisone 2.5 % cream Apply to affected skin at bedtime Tues, Thurs, Sat  Lentigines - Scattered tan macules - Due to sun exposure - Benign-appearing, observe - Recommend daily broad spectrum sunscreen SPF 30+ to sun-exposed areas, reapply every 2 hours as needed. - Call for any changes  Seborrheic Keratoses - Stuck-on, waxy, tan-brown papules and/or plaques  - Benign-appearing - Discussed benign etiology and prognosis. - Observe - Call for any changes  Melanocytic Nevi - Tan-brown and/or pink-flesh-colored symmetric macules and papules - Benign appearing on exam today - Observation - Call clinic for new or changing moles - Recommend daily use of broad spectrum spf 30+ sunscreen to sun-exposed areas.   Hemangiomas Chest, body  - Red papules - Discussed benign nature - Observe - Call for any changes  Actinic Damage - Chronic condition, secondary to cumulative UV/sun exposure - diffuse scaly erythematous macules with underlying dyspigmentation - Recommend daily broad spectrum sunscreen SPF 30+ to sun-exposed areas, reapply every 2 hours as needed.  - Staying in  the shade or wearing long sleeves, sun glasses (UVA+UVB protection) and wide brim hats (4-inch brim around the entire circumference of the hat) are also recommended for sun  protection.  - Call for new or changing lesions.  Skin cancer screening performed today.   Return in about 1 year (around 09/12/2022) for TBSE.  IMarye Round, CMA, am acting as scribe for Sarina Ser, MD .  Documentation: I have reviewed the above documentation for accuracy and completeness, and I agree with the above.  Sarina Ser, MD

## 2021-09-16 ENCOUNTER — Encounter: Payer: Self-pay | Admitting: Dermatology

## 2021-10-30 ENCOUNTER — Other Ambulatory Visit: Payer: Self-pay | Admitting: Internal Medicine

## 2021-10-30 DIAGNOSIS — R921 Mammographic calcification found on diagnostic imaging of breast: Secondary | ICD-10-CM

## 2021-11-14 ENCOUNTER — Ambulatory Visit: Admission: RE | Admit: 2021-11-14 | Payer: Medicare HMO | Source: Ambulatory Visit

## 2021-12-02 DIAGNOSIS — E1151 Type 2 diabetes mellitus with diabetic peripheral angiopathy without gangrene: Secondary | ICD-10-CM | POA: Diagnosis not present

## 2021-12-02 DIAGNOSIS — E538 Deficiency of other specified B group vitamins: Secondary | ICD-10-CM | POA: Diagnosis not present

## 2021-12-09 DIAGNOSIS — N183 Chronic kidney disease, stage 3 unspecified: Secondary | ICD-10-CM | POA: Diagnosis not present

## 2021-12-09 DIAGNOSIS — E1140 Type 2 diabetes mellitus with diabetic neuropathy, unspecified: Secondary | ICD-10-CM | POA: Diagnosis not present

## 2021-12-09 DIAGNOSIS — F32A Depression, unspecified: Secondary | ICD-10-CM | POA: Diagnosis not present

## 2021-12-09 DIAGNOSIS — E1122 Type 2 diabetes mellitus with diabetic chronic kidney disease: Secondary | ICD-10-CM | POA: Diagnosis not present

## 2022-02-25 DIAGNOSIS — Z0100 Encounter for examination of eyes and vision without abnormal findings: Secondary | ICD-10-CM | POA: Diagnosis not present

## 2022-03-06 DIAGNOSIS — R55 Syncope and collapse: Secondary | ICD-10-CM | POA: Diagnosis not present

## 2022-03-06 DIAGNOSIS — R110 Nausea: Secondary | ICD-10-CM | POA: Diagnosis not present

## 2022-03-06 DIAGNOSIS — R531 Weakness: Secondary | ICD-10-CM | POA: Diagnosis not present

## 2022-03-06 DIAGNOSIS — N390 Urinary tract infection, site not specified: Secondary | ICD-10-CM | POA: Diagnosis not present

## 2022-03-17 ENCOUNTER — Encounter: Payer: Self-pay | Admitting: Emergency Medicine

## 2022-03-17 DIAGNOSIS — E538 Deficiency of other specified B group vitamins: Secondary | ICD-10-CM | POA: Diagnosis not present

## 2022-03-17 DIAGNOSIS — Z03818 Encounter for observation for suspected exposure to other biological agents ruled out: Secondary | ICD-10-CM | POA: Diagnosis not present

## 2022-03-17 DIAGNOSIS — R0602 Shortness of breath: Secondary | ICD-10-CM | POA: Diagnosis not present

## 2022-03-17 DIAGNOSIS — I509 Heart failure, unspecified: Secondary | ICD-10-CM | POA: Insufficient documentation

## 2022-03-17 DIAGNOSIS — R42 Dizziness and giddiness: Secondary | ICD-10-CM | POA: Diagnosis present

## 2022-03-17 DIAGNOSIS — F32A Depression, unspecified: Secondary | ICD-10-CM | POA: Diagnosis not present

## 2022-03-17 DIAGNOSIS — E039 Hypothyroidism, unspecified: Secondary | ICD-10-CM | POA: Diagnosis not present

## 2022-03-17 DIAGNOSIS — Z8616 Personal history of COVID-19: Secondary | ICD-10-CM | POA: Diagnosis not present

## 2022-03-17 DIAGNOSIS — D649 Anemia, unspecified: Secondary | ICD-10-CM | POA: Diagnosis not present

## 2022-03-17 DIAGNOSIS — M5116 Intervertebral disc disorders with radiculopathy, lumbar region: Secondary | ICD-10-CM | POA: Diagnosis not present

## 2022-03-17 DIAGNOSIS — I110 Hypertensive heart disease with heart failure: Secondary | ICD-10-CM | POA: Insufficient documentation

## 2022-03-17 DIAGNOSIS — J449 Chronic obstructive pulmonary disease, unspecified: Secondary | ICD-10-CM | POA: Diagnosis not present

## 2022-03-17 LAB — URINALYSIS, ROUTINE W REFLEX MICROSCOPIC
Bilirubin Urine: NEGATIVE
Glucose, UA: NEGATIVE mg/dL
Hgb urine dipstick: NEGATIVE
Ketones, ur: NEGATIVE mg/dL
Leukocytes,Ua: NEGATIVE
Nitrite: NEGATIVE
Protein, ur: NEGATIVE mg/dL
Specific Gravity, Urine: 1.01 (ref 1.005–1.030)
pH: 5 (ref 5.0–8.0)

## 2022-03-17 NOTE — ED Triage Notes (Signed)
Pt presents via POV with complaints of low hemoglobin. Pt was seen at her PCP today who obtained labs and her Hgb was 10.5, decreased from 11.8 on 11/26 and advised that she come here. Pt endorses fatigue, dizziness, dark stools (~3-4 months). Denies hematochezia, CP or SOB.

## 2022-03-18 ENCOUNTER — Emergency Department
Admission: EM | Admit: 2022-03-18 | Discharge: 2022-03-18 | Disposition: A | Discharge status: Home / Self Care | Payer: Medicare HMO | Attending: Emergency Medicine | Admitting: Emergency Medicine

## 2022-03-18 DIAGNOSIS — D649 Anemia, unspecified: Secondary | ICD-10-CM

## 2022-03-18 LAB — CBC WITH DIFFERENTIAL/PLATELET
Abs Immature Granulocytes: 0.03 10*3/uL (ref 0.00–0.07)
Basophils Absolute: 0.1 10*3/uL (ref 0.0–0.1)
Basophils Relative: 1 %
Eosinophils Absolute: 0.4 10*3/uL (ref 0.0–0.5)
Eosinophils Relative: 5 %
HCT: 34.7 % — ABNORMAL LOW (ref 36.0–46.0)
Hemoglobin: 10.6 g/dL — ABNORMAL LOW (ref 12.0–15.0)
Immature Granulocytes: 0 %
Lymphocytes Relative: 26 %
Lymphs Abs: 2.1 10*3/uL (ref 0.7–4.0)
MCH: 27.2 pg (ref 26.0–34.0)
MCHC: 30.5 g/dL (ref 30.0–36.0)
MCV: 89 fL (ref 80.0–100.0)
Monocytes Absolute: 0.5 10*3/uL (ref 0.1–1.0)
Monocytes Relative: 7 %
Neutro Abs: 5.1 10*3/uL (ref 1.7–7.7)
Neutrophils Relative %: 61 %
Platelets: 281 10*3/uL (ref 150–400)
RBC: 3.9 MIL/uL (ref 3.87–5.11)
RDW: 14.3 % (ref 11.5–15.5)
WBC: 8.1 10*3/uL (ref 4.0–10.5)
nRBC: 0 % (ref 0.0–0.2)

## 2022-03-18 LAB — COMPREHENSIVE METABOLIC PANEL
ALT: 13 U/L (ref 0–44)
AST: 15 U/L (ref 15–41)
Albumin: 3.6 g/dL (ref 3.5–5.0)
Alkaline Phosphatase: 59 U/L (ref 38–126)
Anion gap: 7 (ref 5–15)
BUN: 27 mg/dL — ABNORMAL HIGH (ref 8–23)
CO2: 29 mmol/L (ref 22–32)
Calcium: 9.5 mg/dL (ref 8.9–10.3)
Chloride: 103 mmol/L (ref 98–111)
Creatinine, Ser: 1.24 mg/dL — ABNORMAL HIGH (ref 0.44–1.00)
GFR, Estimated: 47 mL/min — ABNORMAL LOW (ref >60)
Glucose, Bld: 143 mg/dL — ABNORMAL HIGH (ref 70–99)
Potassium: 4.4 mmol/L (ref 3.5–5.1)
Sodium: 139 mmol/L (ref 135–145)
Total Bilirubin: 0.5 mg/dL (ref 0.3–1.2)
Total Protein: 7 g/dL (ref 6.5–8.1)

## 2022-03-18 LAB — TROPONIN I (HIGH SENSITIVITY): Troponin I (High Sensitivity): 9 ng/L (ref <18)

## 2022-03-18 NOTE — Discharge Instructions (Addendum)
Your hemoglobin was 10.6.  You are not having active GI bleed currently but you do have occult blood in your stool so we will need to see GI and have another endoscopy and colonoscopy.  Please start taking your iron supplements again.

## 2022-03-18 NOTE — ED Provider Notes (Signed)
[facility] Provider Note    Event Date/Time   First MD Initiated Contact with Patient 03/18/22 0321     (approximate)   History   Dizziness   HPI  Misty Adams is a 66 y.o. female past medical history of COPD diabetes hypertension hyperlipidemia peripheral disease who presents because of dizziness.  Patient notes that for about a week and a half she feels lightheaded and dizzy upon standing.  Also feels like her heart rate goes up and she feels somewhat short of breath with standing.  Denies associated chest pain.  She has seen her primary doctor and had a hemoglobin done that was 11.6.  Followed up in the office today and had hemoglobin 10.5 was told to come to the emergency department.  In August apparently hemoglobin was 15.   She denies black or tarry stool denies blood in her stool denies vomiting or hematemesis but has had some nausea.  Denies fevers has had some chills.  Colonoscopy and endoscopy 1 year ago.   She takes pantoprazole currently.  Past Medical History:  Diagnosis Date   Allergy    Anxiety    CHF (congestive heart failure) (HCC)    COPD (chronic obstructive pulmonary disease) ([place])    Depression    Diabetes mellitus without complication ([place])    Hyperlipidemia    Hypertension    Hypothyroidism    PAD (peripheral artery disease) ([place])    Psoriasis    Substance abuse ([place])    Tobacco abuse     Patient Active Problem List   Diagnosis Date Noted   Pneumonia 09/21/2020   Pneumonia due to COVID-19 virus 09/20/2020   Chronic venous insufficiency 09/16/2017   Subclavian artery stenosis, left (HCC) 07/15/2017     Physical Exam  Triage Vital Signs: ED Triage Vitals  Enc Vitals Group     BP 03/17/22 1940 (!) 154/65     Pulse Rate 03/17/22 1940 78     Resp 03/17/22 1940 18     Temp 03/17/22 1940 97.9 F (36.6 C)     Temp Source 03/17/22 1940 Oral     SpO2 03/17/22 1940 95 %     Weight 03/17/22 1939 187 lb (84.8 kg)      Height 03/17/22 1939 '5\' 4"'$  (1.626 m)     Head Circumference --      Peak Flow --      Pain Score 03/17/22 1940 0     Pain Loc --      Pain Edu? --      Excl. in [place]? --     Most recent vital signs: Vitals:   03/17/22 1940 03/18/22 0423  BP: (!) 154/65 (!) 144/62  Pulse: 78 64  Resp: 18 18  Temp: 97.9 F (36.6 C) (!) 97.5 F (36.4 C)  SpO2: 95% 94%     General: Awake, no distress.  CV:  Good peripheral perfusion.  Resp:  Normal effort.  Abd:  No distention.  Abdomen soft nontender Neuro:             Awake, Alert, Oriented x 3  Other:  Brown stool on rectal exam, Hemoccult positive   ED Results / Procedures / Treatments  Labs (all labs ordered are listed, but only abnormal results are displayed) Labs Reviewed  CBC WITH DIFFERENTIAL/PLATELET - Abnormal; Notable for the following components:      Result Value   Hemoglobin 10.6 (*)    HCT 34.7 (*)  All other components within normal limits  COMPREHENSIVE METABOLIC PANEL - Abnormal; Notable for the following components:   Glucose, Bld 143 (*)    BUN 27 (*)    Creatinine, Ser 1.24 (*)    GFR, Estimated 47 (*)    All other components within normal limits  URINALYSIS, ROUTINE W REFLEX MICROSCOPIC - Abnormal; Notable for the following components:   Color, Urine STRAW (*)    APPearance HAZY (*)    All other components within normal limits  TROPONIN I (HIGH SENSITIVITY)  TROPONIN I (HIGH SENSITIVITY)     EKG  EKG interpretation performed by myself: NSR, nml axis, nml intervals, no acute ischemic changes    RADIOLOGY    PROCEDURES:  Critical Care performed: No  Procedures   MEDICATIONS ORDERED IN ED: Medications - No data to display   IMPRESSION / MDM / [place] / ED COURSE  I reviewed the triage vital signs and the nursing notes.                              Patient's presentation is most consistent with acute presentation with potential threat to life or bodily function.  Differential  diagnosis includes, but is not limited to, upper GI bleed, lower GI bleed, malignancy, hypovolemia AKI electrolyte abnormality  Patient is a 66 year old female who presents because of dizziness upon standing and drop in hemoglobin.  She has had what sounds like orthostatic hypotension over the last week and a half, has seen her primary doctor who checked blood work and hemoglobin 12 days ago was 11.6 and was 10.5 yesterday which is why she was referred to the emergency department.  It was previously 15 in August.  Patient tells me she had been on iron prior to August but after hemoglobin was 15 this was DC'd.  Patient denies any blood in her stool or black tarry stools denies abdominal pain denies fevers chills.  She denies chest pain.  Does occasionally get dyspneic when she stands up and feels lightheaded and also feels like her heart rate goes up at that time.  Patient's vitals are reassuring she is hemodynamically stable.  Hemoglobin here is 10.6, which is not significantly changed from yesterday.  The rest of her labs are reassuring.  On rectal exam she has brown stool that is Hemoccult positive but it is clearly not melanotic and there is no gross blood.  Given this drop in hemoglobin I do feel that patient needs outpatient GI workup but she is not having an active significant GI bleed currently.  Suspect slow blood loss potentially from malignancy.  She did have an endoscopy and colonoscopy last year but will need repeat aspect.  I recommended she restart the iron that she previously was taking and has at home.  We discussed return precautions for black tarry stools of blood loss or ongoing worsening symptoms.  She is appropriate for discharge.       FINAL CLINICAL IMPRESSION(S) / ED DIAGNOSES   Final diagnoses:  Anemia, unspecified type     Rx / DC Orders   ED Discharge Orders     None        Note:  This document was prepared using Dragon voice recognition software and may include  unintentional dictation errors.   Rada Hay, MD 03/18/22 [phone]

## 2022-03-18 NOTE — ED Notes (Signed)
Patient discharged to home per MD order. Patient in stable condition, and deemed medically cleared by ED provider for discharge. Discharge instructions reviewed with patient/family using "Teach Back"; verbalized understanding of medication education and administration, and information about follow-up care. Denies further concerns. ° °

## 2022-03-25 ENCOUNTER — Telehealth: Payer: Self-pay

## 2022-03-25 NOTE — Telephone Encounter (Signed)
        Patient  visited [place] on 11/28    Telephone encounter attempt :  1st  A HIPAA compliant voice message was left requesting a return call.  Instructed patient to call back    [place], [place] [facility]  [phone] [address], [place], [place] [address] Phone: [phone] Email: [email or web address]

## 2022-03-26 ENCOUNTER — Telehealth: Payer: Self-pay

## 2022-03-26 NOTE — Telephone Encounter (Signed)
        Patient  visited [place] on 11/28     Telephone encounter attempt :  2nd  A HIPAA compliant voice message was left requesting a return call.  Instructed patient to call back   [place], [place] [facility]  [phone] [address], [place], [place] [address] Phone: [phone] Email: [email or web address]

## 2022-04-17 DIAGNOSIS — E1151 Type 2 diabetes mellitus with diabetic peripheral angiopathy without gangrene: Secondary | ICD-10-CM | POA: Diagnosis not present

## 2022-04-17 DIAGNOSIS — D500 Iron deficiency anemia secondary to blood loss (chronic): Secondary | ICD-10-CM | POA: Diagnosis not present

## 2022-04-24 ENCOUNTER — Other Ambulatory Visit: Payer: Self-pay | Admitting: Internal Medicine

## 2022-04-24 ENCOUNTER — Ambulatory Visit
Admission: RE | Admit: 2022-04-24 | Discharge: 2022-04-24 | Disposition: A | Discharge status: Home / Self Care | Payer: Medicare HMO | Source: Ambulatory Visit | Attending: Internal Medicine | Admitting: Internal Medicine

## 2022-04-24 DIAGNOSIS — R410 Disorientation, unspecified: Secondary | ICD-10-CM | POA: Diagnosis not present

## 2022-04-24 DIAGNOSIS — I639 Cerebral infarction, unspecified: Secondary | ICD-10-CM | POA: Diagnosis not present

## 2022-04-24 DIAGNOSIS — E1151 Type 2 diabetes mellitus with diabetic peripheral angiopathy without gangrene: Secondary | ICD-10-CM | POA: Diagnosis not present

## 2022-04-24 DIAGNOSIS — H538 Other visual disturbances: Secondary | ICD-10-CM | POA: Diagnosis not present

## 2022-04-24 DIAGNOSIS — Z0000 Encounter for general adult medical examination without abnormal findings: Secondary | ICD-10-CM | POA: Diagnosis not present

## 2022-04-24 DIAGNOSIS — M5414 Radiculopathy, thoracic region: Secondary | ICD-10-CM | POA: Diagnosis not present

## 2022-04-24 DIAGNOSIS — N289 Disorder of kidney and ureter, unspecified: Secondary | ICD-10-CM | POA: Diagnosis not present

## 2022-04-24 DIAGNOSIS — Z8673 Personal history of transient ischemic attack (TIA), and cerebral infarction without residual deficits: Secondary | ICD-10-CM | POA: Diagnosis not present

## 2022-04-24 DIAGNOSIS — J449 Chronic obstructive pulmonary disease, unspecified: Secondary | ICD-10-CM | POA: Diagnosis not present

## 2022-05-01 DIAGNOSIS — D509 Iron deficiency anemia, unspecified: Secondary | ICD-10-CM | POA: Diagnosis not present

## 2022-05-19 DIAGNOSIS — N289 Disorder of kidney and ureter, unspecified: Secondary | ICD-10-CM | POA: Diagnosis not present

## 2022-05-31 IMAGING — US US ABDOMEN COMPLETE
1 series · 14 of 25 positions shown · non-contrast
Comparison: None.

CLINICAL DATA: Nausea and vomiting

EXAM:
ABDOMEN ULTRASOUND COMPLETE

[Series 1: us abdomen complete · 14 of 84 slices shown]
[im 1/84]
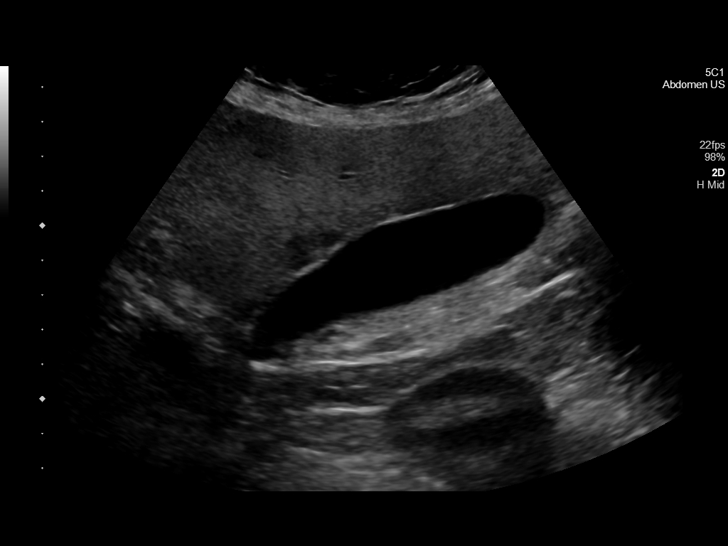
[im 7/84]
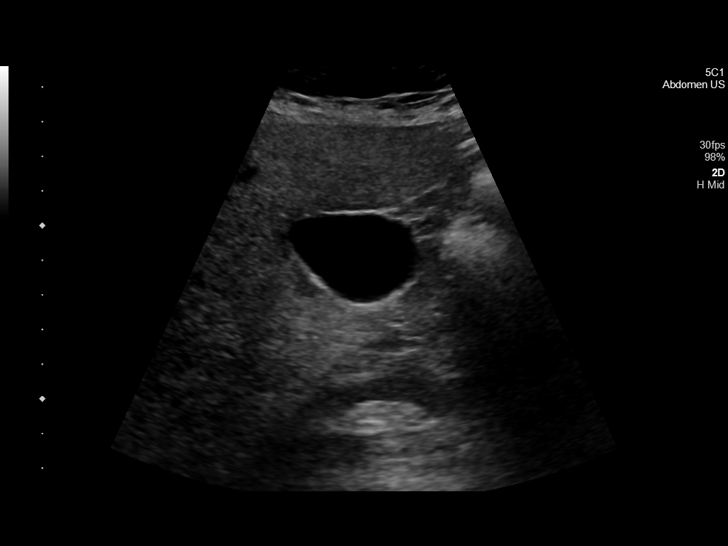
[im 14/84]
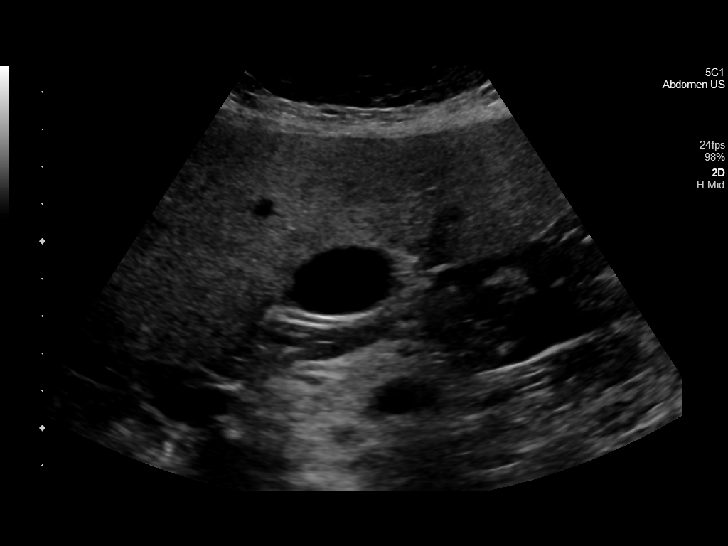
[im 21/84]
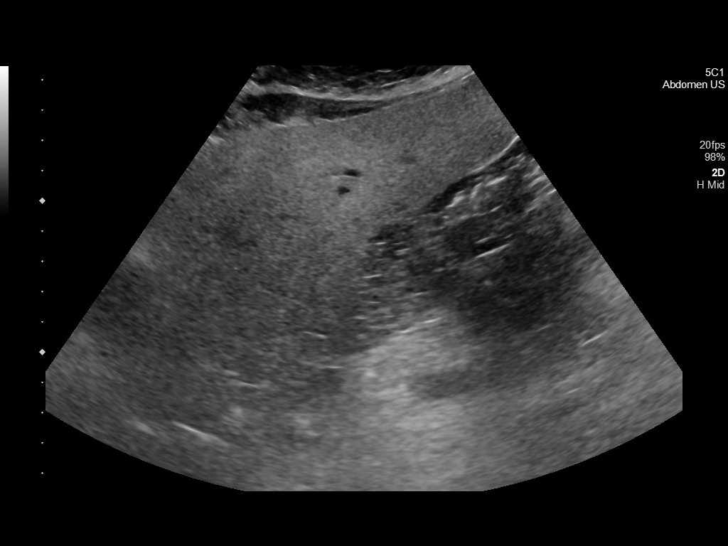
[im 28/84]
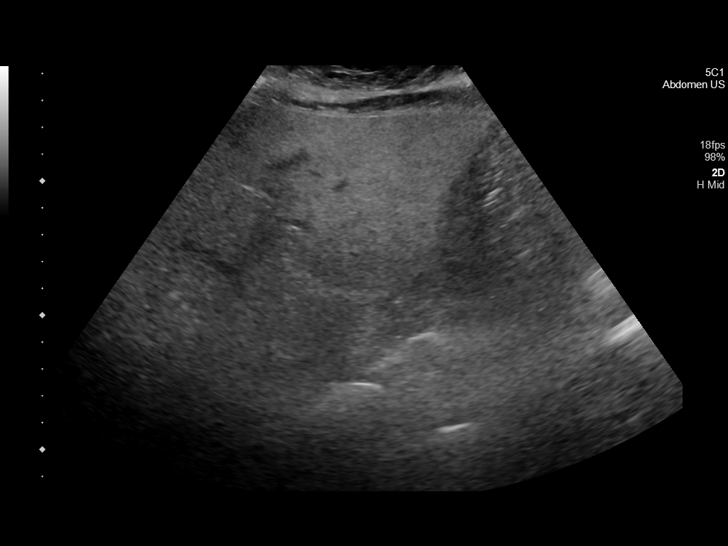
[im 32/84]
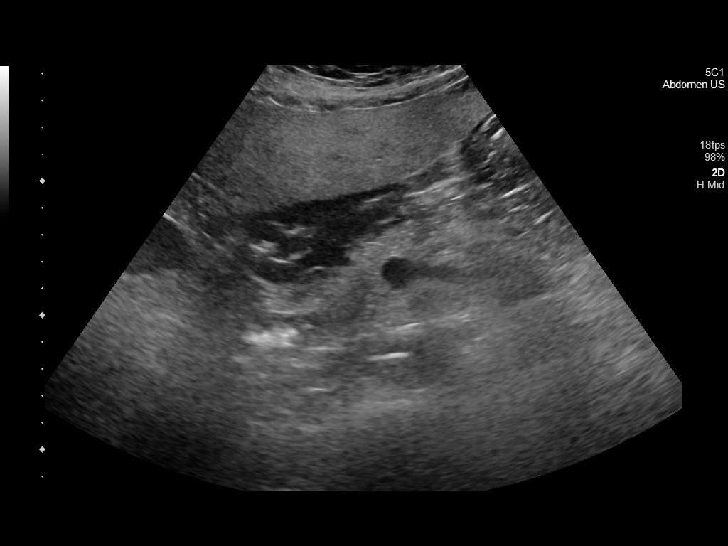
[im 39/84]
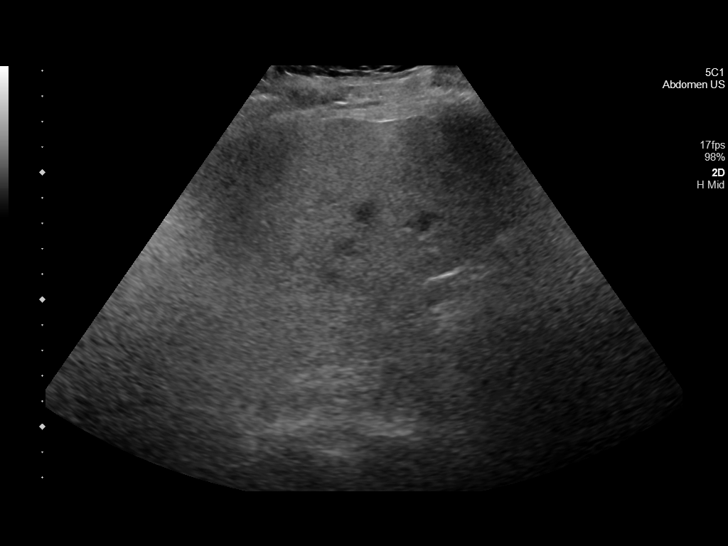
[im 45/84]
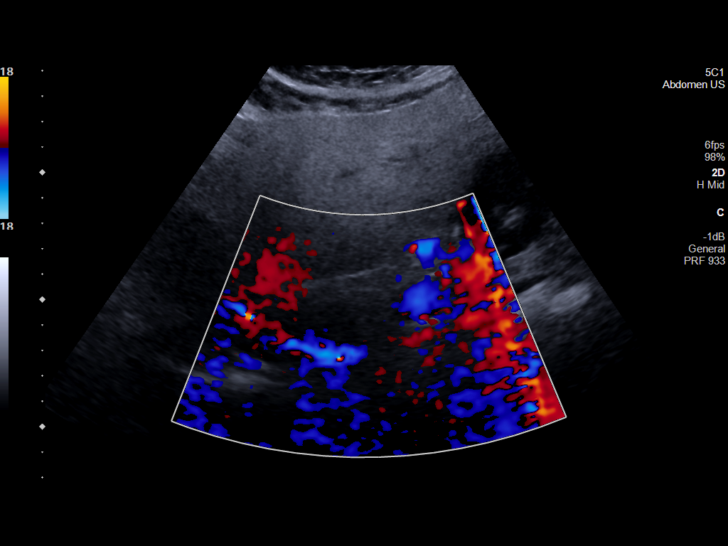
[im 52/84]
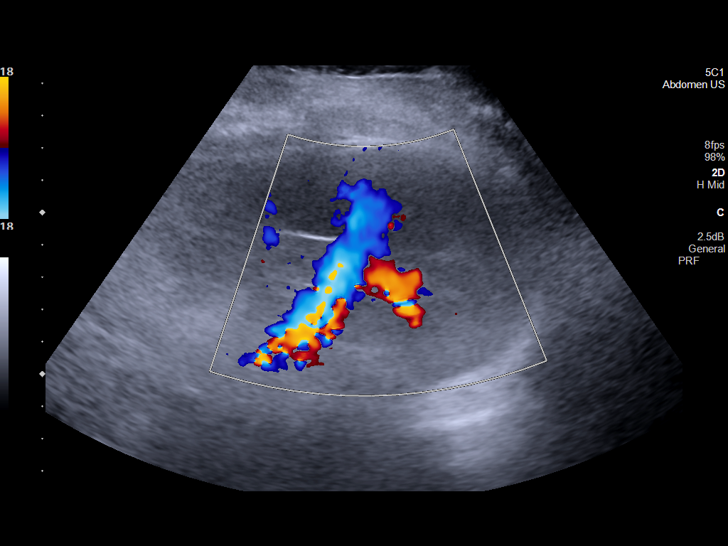
[im 56/84]
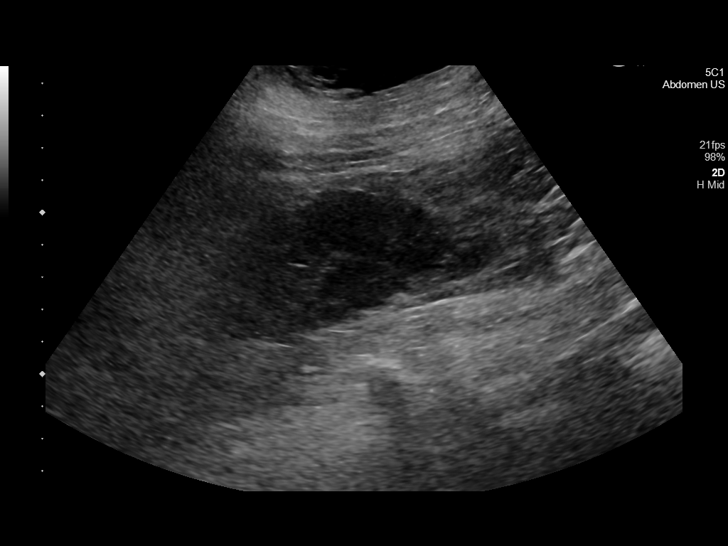
[im 63/84]
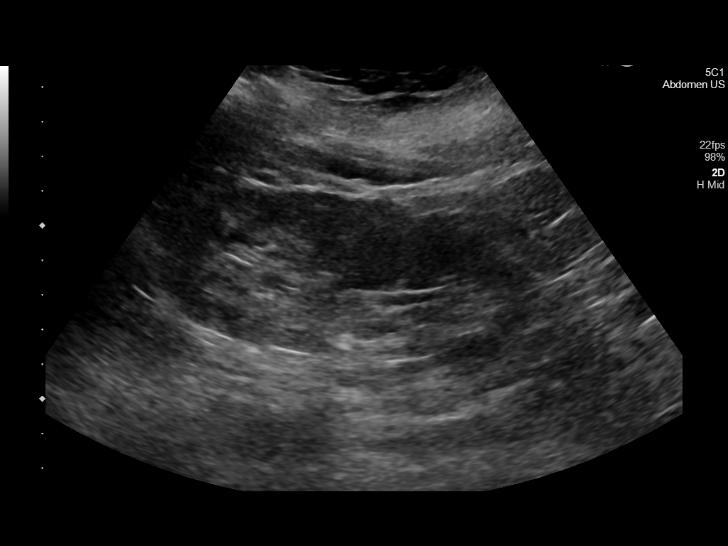
[im 70/84]
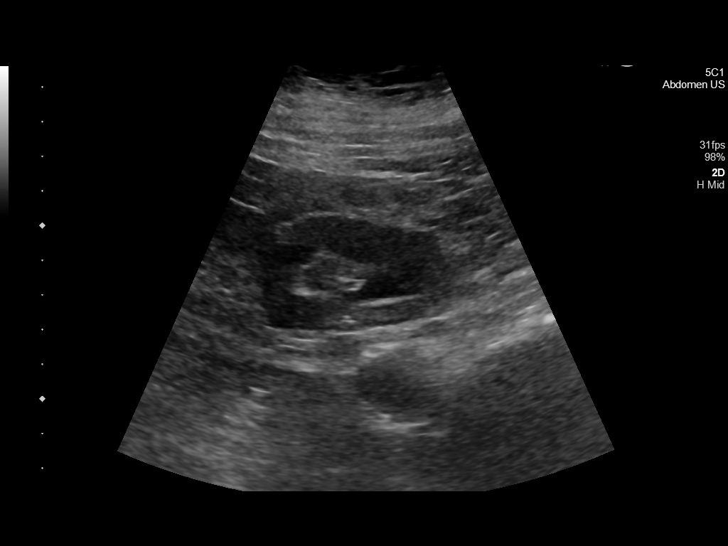
[im 77/84]
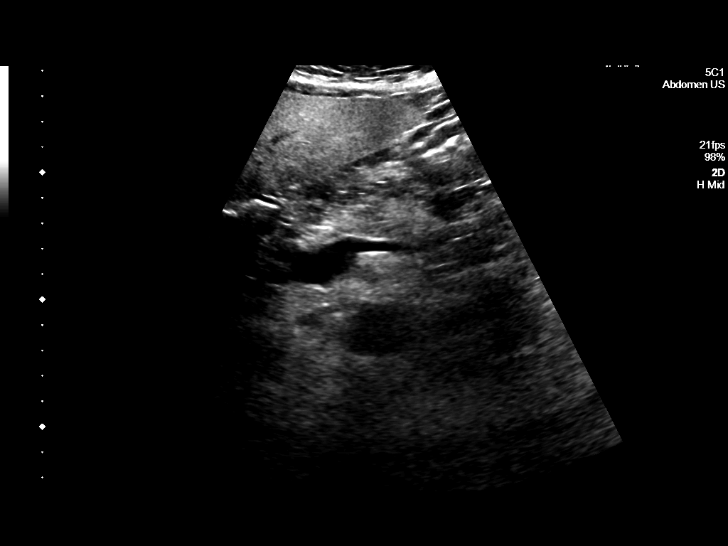
[im 84/84]
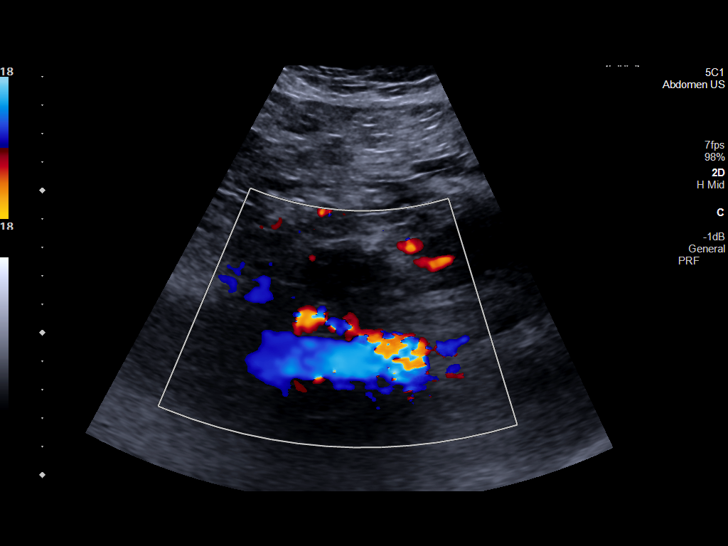

[14 of 25 positions shown; findings below may reference images not displayed]

FINDINGS: Gallbladder: No gallstones or wall thickening visualized. No
sonographic Murphy sign noted by sonographer.

Common bile duct: Diameter: 4 mm

Liver: Increased Preuss echogenicity. No focal lesion. Portal vein
is patent on color Doppler imaging with normal direction of blood
flow towards the liver.

IVC: No abnormality visualized.

Pancreas: Visualized portion unremarkable.

Spleen: Size and appearance within normal limits.

Right Kidney: Length: 12.6 cm. Echogenicity within normal limits. No
mass or hydronephrosis visualized.

Left Kidney: Length: 10.6 cm. Echogenicity within normal limits. No
mass or hydronephrosis visualized.

Abdominal aorta: No aneurysm visualized.

Other findings: None.
IMPRESSION: Increased hepatic echogenicity, consistent with steatosis.

## 2022-06-03 DIAGNOSIS — E119 Type 2 diabetes mellitus without complications: Secondary | ICD-10-CM | POA: Diagnosis not present

## 2022-06-03 DIAGNOSIS — N289 Disorder of kidney and ureter, unspecified: Secondary | ICD-10-CM | POA: Diagnosis not present

## 2022-06-03 DIAGNOSIS — R634 Abnormal weight loss: Secondary | ICD-10-CM | POA: Diagnosis not present

## 2022-06-03 DIAGNOSIS — R1011 Right upper quadrant pain: Secondary | ICD-10-CM | POA: Diagnosis not present

## 2022-06-04 ENCOUNTER — Ambulatory Visit
Admission: RE | Admit: 2022-06-04 | Discharge: 2022-06-04 | Disposition: A | Discharge status: Home / Self Care | Payer: Medicare HMO | Source: Ambulatory Visit | Attending: Internal Medicine | Admitting: Internal Medicine

## 2022-06-04 ENCOUNTER — Other Ambulatory Visit: Payer: Self-pay | Admitting: Internal Medicine

## 2022-06-04 DIAGNOSIS — N6310 Unspecified lump in the right breast, unspecified quadrant: Secondary | ICD-10-CM

## 2022-06-04 DIAGNOSIS — R921 Mammographic calcification found on diagnostic imaging of breast: Secondary | ICD-10-CM

## 2022-06-04 DIAGNOSIS — N6489 Other specified disorders of breast: Secondary | ICD-10-CM | POA: Diagnosis not present

## 2022-06-06 ENCOUNTER — Other Ambulatory Visit: Payer: Self-pay | Admitting: Internal Medicine

## 2022-06-06 DIAGNOSIS — R1084 Generalized abdominal pain: Secondary | ICD-10-CM

## 2022-06-06 DIAGNOSIS — F430 Acute stress reaction: Secondary | ICD-10-CM

## 2022-06-06 DIAGNOSIS — R053 Chronic cough: Secondary | ICD-10-CM

## 2022-06-12 ENCOUNTER — Ambulatory Visit
Admission: RE | Admit: 2022-06-12 | Discharge: 2022-06-12 | Disposition: A | Discharge status: Home / Self Care | Payer: Medicare HMO | Source: Ambulatory Visit | Attending: Internal Medicine | Admitting: Internal Medicine

## 2022-06-12 DIAGNOSIS — J929 Pleural plaque without asbestos: Secondary | ICD-10-CM | POA: Diagnosis not present

## 2022-06-12 DIAGNOSIS — R053 Chronic cough: Secondary | ICD-10-CM | POA: Diagnosis not present

## 2022-06-12 DIAGNOSIS — F430 Acute stress reaction: Secondary | ICD-10-CM | POA: Insufficient documentation

## 2022-06-12 DIAGNOSIS — I2510 Atherosclerotic heart disease of native coronary artery without angina pectoris: Secondary | ICD-10-CM | POA: Diagnosis not present

## 2022-06-12 DIAGNOSIS — E278 Other specified disorders of adrenal gland: Secondary | ICD-10-CM | POA: Diagnosis not present

## 2022-06-12 DIAGNOSIS — I358 Other nonrheumatic aortic valve disorders: Secondary | ICD-10-CM | POA: Diagnosis not present

## 2022-06-12 DIAGNOSIS — D649 Anemia, unspecified: Secondary | ICD-10-CM | POA: Diagnosis not present

## 2022-06-12 DIAGNOSIS — K5730 Diverticulosis of large intestine without perforation or abscess without bleeding: Secondary | ICD-10-CM | POA: Diagnosis not present

## 2022-06-12 DIAGNOSIS — R1084 Generalized abdominal pain: Secondary | ICD-10-CM | POA: Insufficient documentation

## 2022-06-12 DIAGNOSIS — R918 Other nonspecific abnormal finding of lung field: Secondary | ICD-10-CM | POA: Diagnosis not present

## 2022-06-12 MED ORDER — IOHEXOL 300 MG/ML  SOLN
100.0000 mL | Freq: Once | INTRAMUSCULAR | Status: AC | PRN
  Administered 2022-06-12: 85 mL via INTRAVENOUS

## 2022-06-17 DIAGNOSIS — F32A Depression, unspecified: Secondary | ICD-10-CM | POA: Diagnosis not present

## 2022-06-17 DIAGNOSIS — R911 Solitary pulmonary nodule: Secondary | ICD-10-CM | POA: Diagnosis not present

## 2022-06-17 DIAGNOSIS — J449 Chronic obstructive pulmonary disease, unspecified: Secondary | ICD-10-CM | POA: Diagnosis not present

## 2022-06-18 DIAGNOSIS — Z117 Encounter for testing for latent tuberculosis infection: Secondary | ICD-10-CM | POA: Diagnosis not present

## 2022-06-18 DIAGNOSIS — R918 Other nonspecific abnormal finding of lung field: Secondary | ICD-10-CM | POA: Diagnosis not present

## 2022-06-18 DIAGNOSIS — R0602 Shortness of breath: Secondary | ICD-10-CM | POA: Diagnosis not present

## 2022-07-14 ENCOUNTER — Inpatient Hospital Stay
Admission: EM | Admit: 2022-07-14 | Discharge: 2022-07-18 | DRG: 871 | Disposition: A | Discharge status: Home / Self Care | Payer: Medicare HMO | Attending: Internal Medicine | Admitting: Internal Medicine

## 2022-07-14 ENCOUNTER — Encounter: Payer: Self-pay | Admitting: Internal Medicine

## 2022-07-14 ENCOUNTER — Emergency Department: Payer: Medicare HMO

## 2022-07-14 ENCOUNTER — Other Ambulatory Visit: Payer: Self-pay

## 2022-07-14 DIAGNOSIS — N10 Acute tubulo-interstitial nephritis: Secondary | ICD-10-CM

## 2022-07-14 DIAGNOSIS — I129 Hypertensive chronic kidney disease with stage 1 through stage 4 chronic kidney disease, or unspecified chronic kidney disease: Secondary | ICD-10-CM | POA: Diagnosis present

## 2022-07-14 DIAGNOSIS — Z87440 Personal history of urinary (tract) infections: Secondary | ICD-10-CM

## 2022-07-14 DIAGNOSIS — D696 Thrombocytopenia, unspecified: Secondary | ICD-10-CM

## 2022-07-14 DIAGNOSIS — Z806 Family history of leukemia: Secondary | ICD-10-CM

## 2022-07-14 DIAGNOSIS — N261 Atrophy of kidney (terminal): Secondary | ICD-10-CM | POA: Diagnosis not present

## 2022-07-14 DIAGNOSIS — E1122 Type 2 diabetes mellitus with diabetic chronic kidney disease: Secondary | ICD-10-CM | POA: Diagnosis not present

## 2022-07-14 DIAGNOSIS — Z818 Family history of other mental and behavioral disorders: Secondary | ICD-10-CM

## 2022-07-14 DIAGNOSIS — F32A Depression, unspecified: Secondary | ICD-10-CM | POA: Insufficient documentation

## 2022-07-14 DIAGNOSIS — E872 Acidosis, unspecified: Secondary | ICD-10-CM | POA: Diagnosis not present

## 2022-07-14 DIAGNOSIS — A4151 Sepsis due to Escherichia coli [E. coli]: Secondary | ICD-10-CM | POA: Diagnosis not present

## 2022-07-14 DIAGNOSIS — Z8349 Family history of other endocrine, nutritional and metabolic diseases: Secondary | ICD-10-CM

## 2022-07-14 DIAGNOSIS — N3001 Acute cystitis with hematuria: Secondary | ICD-10-CM | POA: Diagnosis present

## 2022-07-14 DIAGNOSIS — E785 Hyperlipidemia, unspecified: Secondary | ICD-10-CM | POA: Diagnosis not present

## 2022-07-14 DIAGNOSIS — Z79899 Other long term (current) drug therapy: Secondary | ICD-10-CM

## 2022-07-14 DIAGNOSIS — E039 Hypothyroidism, unspecified: Secondary | ICD-10-CM | POA: Diagnosis present

## 2022-07-14 DIAGNOSIS — F17210 Nicotine dependence, cigarettes, uncomplicated: Secondary | ICD-10-CM | POA: Diagnosis present

## 2022-07-14 DIAGNOSIS — D72829 Elevated white blood cell count, unspecified: Secondary | ICD-10-CM

## 2022-07-14 DIAGNOSIS — R079 Chest pain, unspecified: Secondary | ICD-10-CM | POA: Diagnosis not present

## 2022-07-14 DIAGNOSIS — R531 Weakness: Secondary | ICD-10-CM | POA: Diagnosis not present

## 2022-07-14 DIAGNOSIS — Z8249 Family history of ischemic heart disease and other diseases of the circulatory system: Secondary | ICD-10-CM

## 2022-07-14 DIAGNOSIS — X58XXXA Exposure to other specified factors, initial encounter: Secondary | ICD-10-CM | POA: Diagnosis present

## 2022-07-14 DIAGNOSIS — I2489 Other forms of acute ischemic heart disease: Secondary | ICD-10-CM | POA: Diagnosis present

## 2022-07-14 DIAGNOSIS — J849 Interstitial pulmonary disease, unspecified: Secondary | ICD-10-CM | POA: Diagnosis present

## 2022-07-14 DIAGNOSIS — N1832 Chronic kidney disease, stage 3b: Secondary | ICD-10-CM | POA: Diagnosis not present

## 2022-07-14 DIAGNOSIS — E669 Obesity, unspecified: Secondary | ICD-10-CM | POA: Diagnosis present

## 2022-07-14 DIAGNOSIS — Z7984 Long term (current) use of oral hypoglycemic drugs: Secondary | ICD-10-CM

## 2022-07-14 DIAGNOSIS — N3090 Cystitis, unspecified without hematuria: Secondary | ICD-10-CM | POA: Diagnosis not present

## 2022-07-14 DIAGNOSIS — J439 Emphysema, unspecified: Secondary | ICD-10-CM | POA: Diagnosis not present

## 2022-07-14 DIAGNOSIS — J449 Chronic obstructive pulmonary disease, unspecified: Secondary | ICD-10-CM

## 2022-07-14 DIAGNOSIS — R0902 Hypoxemia: Secondary | ICD-10-CM | POA: Diagnosis not present

## 2022-07-14 DIAGNOSIS — Z885 Allergy status to narcotic agent status: Secondary | ICD-10-CM

## 2022-07-14 DIAGNOSIS — Z6830 Body mass index (BMI) 30.0-30.9, adult: Secondary | ICD-10-CM

## 2022-07-14 DIAGNOSIS — N170 Acute kidney failure with tubular necrosis: Secondary | ICD-10-CM | POA: Diagnosis not present

## 2022-07-14 DIAGNOSIS — A419 Sepsis, unspecified organism: Secondary | ICD-10-CM | POA: Diagnosis not present

## 2022-07-14 DIAGNOSIS — E1151 Type 2 diabetes mellitus with diabetic peripheral angiopathy without gangrene: Secondary | ICD-10-CM | POA: Diagnosis present

## 2022-07-14 DIAGNOSIS — E871 Hypo-osmolality and hyponatremia: Secondary | ICD-10-CM | POA: Diagnosis not present

## 2022-07-14 DIAGNOSIS — Z83719 Family history of colon polyps, unspecified: Secondary | ICD-10-CM

## 2022-07-14 DIAGNOSIS — S40022A Contusion of left upper arm, initial encounter: Secondary | ICD-10-CM | POA: Diagnosis present

## 2022-07-14 DIAGNOSIS — R6520 Severe sepsis without septic shock: Secondary | ICD-10-CM

## 2022-07-14 DIAGNOSIS — N390 Urinary tract infection, site not specified: Secondary | ICD-10-CM

## 2022-07-14 DIAGNOSIS — E876 Hypokalemia: Secondary | ICD-10-CM | POA: Diagnosis present

## 2022-07-14 DIAGNOSIS — Z833 Family history of diabetes mellitus: Secondary | ICD-10-CM

## 2022-07-14 DIAGNOSIS — I10 Essential (primary) hypertension: Secondary | ICD-10-CM | POA: Diagnosis not present

## 2022-07-14 DIAGNOSIS — Z79890 Hormone replacement therapy (postmenopausal): Secondary | ICD-10-CM | POA: Diagnosis not present

## 2022-07-14 DIAGNOSIS — I4891 Unspecified atrial fibrillation: Secondary | ICD-10-CM | POA: Diagnosis not present

## 2022-07-14 DIAGNOSIS — Z803 Family history of malignant neoplasm of breast: Secondary | ICD-10-CM

## 2022-07-14 DIAGNOSIS — Z90710 Acquired absence of both cervix and uterus: Secondary | ICD-10-CM

## 2022-07-14 DIAGNOSIS — R000 Tachycardia, unspecified: Secondary | ICD-10-CM | POA: Diagnosis not present

## 2022-07-14 DIAGNOSIS — Z7982 Long term (current) use of aspirin: Secondary | ICD-10-CM

## 2022-07-14 DIAGNOSIS — S40021A Contusion of right upper arm, initial encounter: Secondary | ICD-10-CM | POA: Diagnosis present

## 2022-07-14 DIAGNOSIS — F419 Anxiety disorder, unspecified: Secondary | ICD-10-CM | POA: Diagnosis not present

## 2022-07-14 DIAGNOSIS — Z888 Allergy status to other drugs, medicaments and biological substances status: Secondary | ICD-10-CM

## 2022-07-14 DIAGNOSIS — R109 Unspecified abdominal pain: Secondary | ICD-10-CM | POA: Diagnosis not present

## 2022-07-14 DIAGNOSIS — Z1152 Encounter for screening for COVID-19: Secondary | ICD-10-CM | POA: Diagnosis not present

## 2022-07-14 DIAGNOSIS — Z8049 Family history of malignant neoplasm of other genital organs: Secondary | ICD-10-CM

## 2022-07-14 DIAGNOSIS — K5730 Diverticulosis of large intestine without perforation or abscess without bleeding: Secondary | ICD-10-CM | POA: Diagnosis not present

## 2022-07-14 DIAGNOSIS — Z83438 Family history of other disorder of lipoprotein metabolism and other lipidemia: Secondary | ICD-10-CM

## 2022-07-14 DIAGNOSIS — N179 Acute kidney failure, unspecified: Secondary | ICD-10-CM | POA: Diagnosis not present

## 2022-07-14 DIAGNOSIS — Z823 Family history of stroke: Secondary | ICD-10-CM

## 2022-07-14 LAB — BASIC METABOLIC PANEL
Anion gap: 17 — ABNORMAL HIGH (ref 5–15)
BUN: 72 mg/dL — ABNORMAL HIGH (ref 8–23)
CO2: 24 mmol/L (ref 22–32)
Calcium: 9 mg/dL (ref 8.9–10.3)
Chloride: 91 mmol/L — ABNORMAL LOW (ref 98–111)
Creatinine, Ser: 2.94 mg/dL — ABNORMAL HIGH (ref 0.44–1.00)
GFR, Estimated: 17 mL/min — ABNORMAL LOW (ref >60)
Glucose, Bld: 188 mg/dL — ABNORMAL HIGH (ref 70–99)
Potassium: 2.8 mmol/L — ABNORMAL LOW (ref 3.5–5.1)
Sodium: 132 mmol/L — ABNORMAL LOW (ref 135–145)

## 2022-07-14 LAB — URINALYSIS, W/ REFLEX TO CULTURE (INFECTION SUSPECTED)
Bilirubin Urine: NEGATIVE
Glucose, UA: NEGATIVE mg/dL
Ketones, ur: NEGATIVE mg/dL
Nitrite: NEGATIVE
Protein, ur: >=300 mg/dL — AB
Specific Gravity, Urine: 1.013 (ref 1.005–1.030)
WBC, UA: >50 WBC/hpf (ref 0–5)
pH: 5 (ref 5.0–8.0)

## 2022-07-14 LAB — CBC
HCT: 45.6 % (ref 36.0–46.0)
Hemoglobin: 14.7 g/dL (ref 12.0–15.0)
MCH: 27.4 pg (ref 26.0–34.0)
MCHC: 32.2 g/dL (ref 30.0–36.0)
MCV: 85.1 fL (ref 80.0–100.0)
Platelets: 223 10*3/uL (ref 150–400)
RBC: 5.36 MIL/uL — ABNORMAL HIGH (ref 3.87–5.11)
RDW: 15.6 % — ABNORMAL HIGH (ref 11.5–15.5)
WBC: 36.6 10*3/uL — ABNORMAL HIGH (ref 4.0–10.5)
nRBC: 0 % (ref 0.0–0.2)

## 2022-07-14 LAB — TROPONIN I (HIGH SENSITIVITY)
Troponin I (High Sensitivity): 56 ng/L — ABNORMAL HIGH (ref <18)
Troponin I (High Sensitivity): 44 ng/L — ABNORMAL HIGH (ref <18)

## 2022-07-14 LAB — GLUCOSE, CAPILLARY: Glucose-Capillary: 122 mg/dL — ABNORMAL HIGH (ref 70–99)

## 2022-07-14 LAB — SARS CORONAVIRUS 2 BY RT PCR: SARS Coronavirus 2 by RT PCR: NEGATIVE

## 2022-07-14 LAB — LACTIC ACID, PLASMA
Lactic Acid, Venous: 3.3 mmol/L (ref 0.5–1.9)
Lactic Acid, Venous: 2.8 mmol/L (ref 0.5–1.9)

## 2022-07-14 LAB — CBG MONITORING, ED: Glucose-Capillary: 137 mg/dL — ABNORMAL HIGH (ref 70–99)

## 2022-07-14 MED ORDER — POTASSIUM CHLORIDE 10 MEQ/100ML IV SOLN
10.0000 meq | INTRAVENOUS | Status: AC
  Administered 2022-07-14 (×3): 10 meq via INTRAVENOUS
  Filled 2022-07-14 (×4): qty 100

## 2022-07-14 MED ORDER — POLYETHYLENE GLYCOL 3350 17 G PO PACK: 17.0000 g | PACK | Freq: Every day | ORAL | Status: DC | PRN

## 2022-07-14 MED ORDER — INSULIN ASPART 100 UNIT/ML IJ SOLN
0.0000 [IU] | Freq: Three times a day (TID) | INTRAMUSCULAR | Status: DC
  Administered 2022-07-14: 1 [IU] via SUBCUTANEOUS
  Administered 2022-07-16: 2 [IU] via SUBCUTANEOUS
  Filled 2022-07-14: qty 1

## 2022-07-14 MED ORDER — SODIUM CHLORIDE 0.9 % IV BOLUS (SEPSIS)
1000.0000 mL | Freq: Once | INTRAVENOUS | Status: AC
  Administered 2022-07-14: 1000 mL via INTRAVENOUS

## 2022-07-14 MED ORDER — IPRATROPIUM-ALBUTEROL 0.5-2.5 (3) MG/3ML IN SOLN: 3.0000 mL | Freq: Four times a day (QID) | RESPIRATORY_TRACT | Status: DC | PRN

## 2022-07-14 MED ORDER — SODIUM CHLORIDE 0.9 % IV SOLN
2.0000 g | INTRAVENOUS | Status: DC
  Administered 2022-07-14: 2 g via INTRAVENOUS
  Filled 2022-07-14: qty 20

## 2022-07-14 MED ORDER — ACETAMINOPHEN 325 MG PO TABS
650.0000 mg | ORAL_TABLET | Freq: Four times a day (QID) | ORAL | Status: DC | PRN
  Administered 2022-07-14 – 2022-07-15 (×2): 650 mg via ORAL
  Filled 2022-07-14 (×3): qty 2

## 2022-07-14 MED ORDER — POTASSIUM CHLORIDE CRYS ER 20 MEQ PO TBCR
40.0000 meq | EXTENDED_RELEASE_TABLET | Freq: Once | ORAL | Status: AC
  Administered 2022-07-14: 40 meq via ORAL
  Filled 2022-07-14: qty 2

## 2022-07-14 MED ORDER — TRAMADOL HCL 50 MG PO TABS
50.0000 mg | ORAL_TABLET | Freq: Four times a day (QID) | ORAL | Status: DC | PRN
  Administered 2022-07-15 – 2022-07-18 (×8): 50 mg via ORAL
  Filled 2022-07-14 (×8): qty 1

## 2022-07-14 MED ORDER — ACETAMINOPHEN 650 MG RE SUPP: 650.0000 mg | Freq: Four times a day (QID) | RECTAL | Status: DC | PRN

## 2022-07-14 MED ORDER — SODIUM CHLORIDE 0.9 % IV SOLN
2.0000 g | INTRAVENOUS | Status: DC
  Administered 2022-07-15 – 2022-07-17 (×3): 2 g via INTRAVENOUS
  Filled 2022-07-14: qty 2
  Filled 2022-07-14 (×3): qty 20

## 2022-07-14 MED ORDER — GABAPENTIN 100 MG PO CAPS
100.0000 mg | ORAL_CAPSULE | Freq: Every day | ORAL | Status: DC
  Administered 2022-07-14 – 2022-07-17 (×4): 100 mg via ORAL
  Filled 2022-07-14 (×4): qty 1

## 2022-07-14 MED ORDER — LACTATED RINGERS IV SOLN: INTRAVENOUS | Status: DC

## 2022-07-14 MED ORDER — SODIUM CHLORIDE 0.9 % IV SOLN
500.0000 mg | INTRAVENOUS | Status: DC
  Administered 2022-07-14: 500 mg via INTRAVENOUS
  Filled 2022-07-14: qty 5

## 2022-07-14 MED ORDER — DOCUSATE SODIUM 100 MG PO CAPS
100.0000 mg | ORAL_CAPSULE | Freq: Two times a day (BID) | ORAL | Status: DC
  Administered 2022-07-14 – 2022-07-16 (×4): 100 mg via ORAL
  Filled 2022-07-14 (×7): qty 1

## 2022-07-14 MED ORDER — INSULIN ASPART 100 UNIT/ML IJ SOLN: 0.0000 [IU] | Freq: Every day | INTRAMUSCULAR | Status: DC

## 2022-07-14 MED ORDER — ONDANSETRON HCL 4 MG/2ML IJ SOLN
4.0000 mg | Freq: Four times a day (QID) | INTRAMUSCULAR | Status: DC | PRN
  Administered 2022-07-14: 4 mg via INTRAVENOUS
  Filled 2022-07-14: qty 2

## 2022-07-14 MED ORDER — ONDANSETRON HCL 4 MG PO TABS: 4.0000 mg | ORAL_TABLET | Freq: Four times a day (QID) | ORAL | Status: DC | PRN

## 2022-07-14 MED ORDER — ENOXAPARIN SODIUM 40 MG/0.4ML IJ SOSY: 40.0000 mg | PREFILLED_SYRINGE | INTRAMUSCULAR | Status: DC

## 2022-07-14 MED ORDER — POTASSIUM CHLORIDE IN NACL 20-0.9 MEQ/L-% IV SOLN
INTRAVENOUS | Status: DC
  Filled 2022-07-14 (×7): qty 1000

## 2022-07-14 MED ORDER — ENOXAPARIN SODIUM 30 MG/0.3ML IJ SOSY
30.0000 mg | PREFILLED_SYRINGE | INTRAMUSCULAR | Status: DC
  Administered 2022-07-14: 30 mg via SUBCUTANEOUS
  Filled 2022-07-14: qty 0.3

## 2022-07-14 NOTE — H&P (Signed)
History and Physical    Patient: Misty Adams [id number] DOB: 1955/11/13 DOA: 07/14/2022 DOS: the patient was seen and examined on 07/14/2022 PCP: Rusty Aus, MD  Patient coming from: Home  Chief Complaint:  Chief Complaint  Patient presents with   weakness   HPI: Misty Adams is a 67 y.o. female with medical history significant of COPD with ongoing tobacco abuse, abnormal CT chest consistent with interstitial lung disease with recent pulmonary consultation, depression, diabetes, hypertension, PAD comes to the emergency room accompanied by daughter at bedside with generalized weakness, right flank pain and foul-smelling urine with dysuria.  Patient was found to be tachycardic, tachypnea with elevated white count of 36,000 and abnormal urinalysis. She received IV Rocephin and Zithromax. Patient's chest x-ray does not show pneumonia. Patient is being admitted for sepsis secondary to UTI. Patient has had UTI in the past. Complains of some nausea earlier and vomiting. Review of Systems: As mentioned in the history of present illness. All other systems reviewed and are negative. Past Medical History:  Diagnosis Date   Allergy    Anxiety    CHF (congestive heart failure) (HCC)    COPD (chronic obstructive pulmonary disease) ([place])    Depression    Diabetes mellitus without complication ([place])    Hyperlipidemia    Hypertension    Hypothyroidism    PAD (peripheral artery disease) ([place])    Psoriasis    Substance abuse ([place])    Tobacco abuse    Past Surgical History:  Procedure Laterality Date   ABDOMINAL HYSTERECTOMY     AORTIC ARCH ANGIOGRAPHY N/A 08/06/2017   Procedure: AORTIC ARCH ANGIOGRAPHY;  Surgeon: Conrad [place], MD;  Location: [place] [facility];  Service: Cardiovascular;  Laterality: N/A;   APPENDECTOMY     CESAREAN SECTION     COLONOSCOPY     COLONOSCOPY WITH PROPOFOL N/A 12/07/2020   Procedure: COLONOSCOPY WITH PROPOFOL;  Surgeon: Annamaria Helling, DO;   Location[facility] ENDOSCOPY;  Service: Gastroenterology;  Laterality: N/A;   dilatation and curettage     DILATION AND CURETTAGE OF UTERUS     ESOPHAGOGASTRODUODENOSCOPY N/A 12/07/2020   Procedure: ESOPHAGOGASTRODUODENOSCOPY (EGD);  Surgeon: Annamaria Helling, DO;  Location: [facility] ENDOSCOPY;  Service: Gastroenterology;  Laterality: N/A;   KNEE SURGERY     left wrist ganglion cyst removal     OOPHORECTOMY  1994   PERIPHERAL VASCULAR INTERVENTION Left 08/06/2017   Procedure: PERIPHERAL VASCULAR INTERVENTION;  Surgeon: Conrad Benton, MD;  Location: [place] [facility];  Service: Cardiovascular;  Laterality: Left;  subclavian   TUBAL LIGATION     Social History:  reports that she has been smoking cigarettes. She has a 100.00 pack-year smoking history. She has never used smokeless tobacco. She reports current alcohol use. She reports that she does not currently use drugs.  Allergies  Allergen Reactions   Hydrocodone-Acetaminophen Hives   Vicodin [Hydrocodone-Acetaminophen] Hives    Family History  Problem Relation Age of Onset   Uterine cancer Mother    Stroke Mother    Heart attack Father    Coronary artery disease Father    Leukemia Father    Depression Father    Diabetes Father    Hyperlipidemia Father    Hypertension Father    Colon polyps Sister    Obesity Sister    Thyroid disease Sister    Breast cancer Maternal Aunt    Breast cancer Maternal Aunt     Prior to Admission medications  Medication Sig Start Date End Date Taking? Authorizing Provider  acetaminophen (TYLENOL) 500 MG tablet Take 1,000 mg by mouth every 6 (six) hours as needed for moderate pain or headache.    [provider]  albuterol (PROVENTIL HFA;VENTOLIN HFA) 108 (90 Base) MCG/ACT inhaler Inhale 2 puffs into the lungs every 6 (six) hours as needed for wheezing or shortness of breath. 07/14/17   Darel Hong, MD  ALPRAZolam Duanne Moron) 0.5 MG tablet Take 0.5 mg by mouth 2 (two) times daily as needed for  anxiety.  01/16/17   [provider]  ascorbic acid (VITAMIN C) 500 MG tablet Take 1 tablet (500 mg total) by mouth daily. 09/22/20   Regalado, Belkys A, MD  aspirin 81 MG tablet Take 81 mg by mouth at bedtime.     [provider]  atorvastatin (LIPITOR) 40 MG tablet Take 1 tablet (40 mg total) by mouth at bedtime. 07/14/17 07/14/18  Darel Hong, MD  BREZTRI AEROSPHERE 160-9-4.8 MCG/ACT AERO Inhale 2 puffs into the lungs 2 (two) times daily. 06/23/22 06/23/23  [provider]  Cholecalciferol 1.25 MG (50000 UT) capsule Take 50,000 Units by mouth daily.    [provider]  famotidine (PEPCID) 40 MG tablet Take 40 mg by mouth at bedtime.    [provider]  ferrous gluconate (FERGON) 324 MG tablet Take 324 mg by mouth every morning.    [provider]  furosemide (LASIX) 20 MG tablet Take 2 tablets (40 mg total) by mouth daily as needed for fluid. 09/21/20 10/21/20  Regalado, Belkys A, MD  gabapentin (NEURONTIN) 100 MG capsule Take 100-200 mg by mouth at bedtime.    [provider]  guaiFENesin-dextromethorphan (ROBITUSSIN DM) 100-10 MG/5ML syrup Take 10 mLs by mouth every 4 (four) hours as needed for cough. 09/21/20   Regalado, Belkys A, MD  hydrochlorothiazide (HYDRODIURIL) 25 MG tablet Take 25 mg by mouth daily.    [provider]  hydrocortisone 2.5 % cream Apply to affected skin at bedtime Cain Saupe, Sat 09/11/21   Ralene Bathe, MD  ibuprofen (ADVIL) 800 MG tablet Take 800 mg by mouth every 8 (eight) hours as needed.    [provider]  ketoconazole (NIZORAL) 2 % cream Apply to affected skin at bedtime Mon, Wed, Fri 09/11/21   Ralene Bathe, MD  levothyroxine (SYNTHROID, LEVOTHROID) 125 MCG tablet Take 125 mcg by mouth daily.     [provider]  metFORMIN (GLUCOPHAGE) 500 MG tablet Take 500 mg by mouth 2 (two) times daily. 06/11/22   [provider]  metFORMIN (GLUCOPHAGE-XR) 500 MG 24 hr tablet Take  500 mg by mouth 2 (two) times daily with a meal.    [provider]  mirtazapine (REMERON) 7.5 MG tablet Take 7.5 mg by mouth at bedtime. 07/10/22   [provider]  Multiple Vitamin (MULTIVITAMIN WITH MINERALS) TABS tablet Take 1 tablet by mouth daily. 09/22/20   Regalado, Belkys A, MD  ondansetron (ZOFRAN) 4 MG tablet Take 4 mg by mouth every 4 (four) hours as needed for nausea or vomiting.    [provider]  pantoprazole (PROTONIX) 40 MG tablet Take 40 mg by mouth daily.     [provider]  potassium chloride (KLOR-CON) 10 MEQ tablet Take 1 tablet (10 mEq total) by mouth 6 (six) times daily 12/09/21   [provider]  potassium chloride SA (KLOR-CON) 20 MEQ tablet Take 2 tablets (40 mEq total) by mouth 2 (two) times daily as needed (  Take potasium tablets twice a day when you take lasix.). 09/21/20 10/21/20  Regalado, Belkys A, MD  RYBELSUS 3 MG TABS Take 3 mg by mouth daily. 02/04/22   [provider]  spironolactone (ALDACTONE) 50 MG tablet Take 50 mg by mouth daily.    [provider]  temazepam (RESTORIL) 15 MG capsule Take 15 mg by mouth at bedtime as needed for sleep. 08/20/20   [provider]  traZODone (DESYREL) 100 MG tablet Take 100 mg by mouth daily as needed for sleep. 08/31/20   [provider]  venlafaxine XR (EFFEXOR-XR) 37.5 MG 24 hr capsule Take 37.5 mg by mouth daily with breakfast.    [provider]  venlafaxine XR (EFFEXOR-XR) 75 MG 24 hr capsule Take 75 mg by mouth daily. 04/24/22 04/24/23  [provider]  zinc sulfate 220 (50 Zn) MG capsule Take 1 capsule (220 mg total) by mouth daily. 09/21/20   Elmarie Shiley, MD    Physical Exam: Vitals:   07/14/22 1530 07/14/22 1600 07/14/22 1640 07/14/22 1700  BP: 122/83 (!) 127/50 116/79 117/75  Pulse: (!) 102 93 99 (!) 101  Resp: (!) 25 (!) 21 18 19   Temp:      TempSrc:      SpO2: 93% 95% 98% 96%  Weight:      Height:       Physical  Exam Constitutional:      Appearance: Normal appearance.  HENT:     Head: Normocephalic.     Mouth/Throat:     Mouth: Mucous membranes are dry.  Cardiovascular:     Rate and Rhythm: Tachycardia present.  Pulmonary:     Effort: Pulmonary effort is normal.     Comments: Distant Breath ssounds Abdominal:     General: Abdomen is flat. Bowel sounds are normal.     Tenderness: There is abdominal tenderness.  Musculoskeletal:     Cervical back: Normal range of motion.  Skin:    General: Skin is warm and dry.  Neurological:     General: No focal deficit present.     Mental Status: She is alert. Mental status is at baseline.  Psychiatric:        Mood and Affect: Mood normal.     Assessment and Plan:  Misty Adams is a 67 y.o. female with medical history significant of COPD with ongoing tobacco abuse, abnormal CT chest consistent with interstitial lung disease with recent pulmonary consultation, depression, diabetes, hypertension, PAD comes to the emergency room accompanied by daughter at bedside with generalized weakness, right flank pain and foul-smelling urine with dysuria.  Severe Sepsis secondary to acute cystitis without hematuria -- patient came in with generalized weakness, found to have tachycardia elevated white count tachypnea and abnormal UA -- continue IV Rocephin -- follow-up blood culture, urine culture -- continue IV fluids, follow-up lactate -- initial lactate elevated 3.3 -- will consider CT abdomen if symptoms are not improve -- white count 36K--- follow CBC  Acute on chronic kidney disease stage IIIB -- baseline creatinine around 1.7 -- came in with creatinine of 2.9 -- continue IV hydration  Hypokalemia/hyponatremia -- replete potassium  Mild elevated troponin appears demand ischemia. 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 -- Patient follows with Dr. Raul Del  Hypertension -- hold BP meds to avoid hypotension in the setting of sepsis   Advance Care Planning:   Code Status: Full Code discussed with patient  Consults: none  Family Communication: daughter at bedside  Severity of Illness: The appropriate patient status for this patient is INPATIENT. Inpatient status is judged to be reasonable and necessary in order to provide the required intensity of service to ensure the patient's safety. The patient's presenting symptoms, physical exam findings, and initial radiographic and laboratory data in the context of their chronic comorbidities is felt to place them at high risk for further clinical deterioration. Furthermore, it is not anticipated that the patient will be medically stable for discharge from the hospital within 2 midnights of admission.   * I certify that at the point of admission it is my clinical judgment that the patient will require inpatient hospital care spanning beyond 2 midnights from the point of admission due to high intensity of service, high risk for further deterioration and high frequency of surveillance required.*  Author: Fritzi Mandes, MD 07/14/2022 5:13 PM  For on call review [email or web address].

## 2022-07-14 NOTE — ED Triage Notes (Signed)
Pt to ED for generalized weakness for a few days, states EMS told her she was in a fib, denies hx of a fib. Reports right rib pain for 1 week.

## 2022-07-14 NOTE — ED Notes (Signed)
Placed on 2L [place]  

## 2022-07-14 NOTE — Consult Note (Signed)
CODE SEPSIS - PHARMACY COMMUNICATION  **Broad Spectrum Antibiotics should be administered within 1 hour of Sepsis diagnosis**  Time Code Sepsis Called/Page Received: 1506  Antibiotics Ordered: ceftriaxone and azithromycin  Time of 1st antibiotic administration: 1511  Additional action taken by pharmacy: N/A   Lorin Picket ,PharmD Clinical Pharmacist  07/14/2022  3:03 PM

## 2022-07-14 NOTE — Sepsis Progress Note (Signed)
Sepsis protocol monitored by eLink ?

## 2022-07-14 NOTE — ED Triage Notes (Signed)
First Nurse: Pt here via [facility] with weakness x3 days. Pt states she took a home UTI test and it was positive.   155/131 50-100 (not dx with a fib) 94% RA 196-cbg

## 2022-07-14 NOTE — Progress Notes (Signed)
       CROSS COVER NOTE  NAME: Misty Adams MRN: [id number] DOB : 08-03-1955 ATTENDING PHYSICIAN: Fritzi Mandes, MD    Date of Service   07/14/2022   HPI/Events of Note   Report/ Request  "patient just arrived to floor from ED - two things - she is under med-surg tele level of care but no tele is ordered. she is admitted with sepsis r/t UTI/cystitis. Do you want to order tele for this patient? she is still febrile and tachycardic. Also, her respiratory panel is negative - may we DC the airborne and contact precautions? "   "she has LR at 150-ml/hr ordered by ED MD, also has NS w/20-mEq K at 100-ml/hr ordered by Dr. Posey Pronto - do we want to DC the LR order instead of having both running? "   Interventions   Assessment/Plan: Telemetry ordered Airborne/Contact precautions discontinued Continue NS w/K; LR discontinued        To reach the provider On-Call:   7AM- 7PM see care teams to locate the attending and reach out to them via [email or web address] Password: [email or web address] 7PM-7AM contact night-coverage If you still have difficulty reaching the appropriate provider, please page the [facility] (Director on Call) for [facility] on amion for assistance  This document was prepared using Systems analyst and may include unintentional dictation errors.  Neomia Glass DNP, MBA, FNP-BC, PMHNP-BC Nurse Practitioner [facility] Pager [phone]

## 2022-07-14 NOTE — Progress Notes (Signed)
PHARMACIST - PHYSICIAN COMMUNICATION  CONCERNING:  Enoxaparin (Lovenox) for DVT Prophylaxis    RECOMMENDATION: Patient was prescribed enoxaprin 40mg  q24 hours for VTE prophylaxis.   Filed Weights   07/14/22 1409  Weight: 81.6 kg (180 lb)    Body mass index is 30.9 kg/m.  Estimated Creatinine Clearance: 18.7 mL/min (A) (by C-G formula based on SCr of 2.94 mg/dL (H)).  Patient is candidate for enoxaparin 30mg  every 24 hours based on CrCl <25ml/min or Weight <45kg  DESCRIPTION: Pharmacy has adjusted enoxaparin dose per [facility] policy.  Patient is now receiving enoxaparin 30 mg every 24 hours    Vira Blanco, PharmD Clinical Pharmacist  07/14/2022 6:06 PM

## 2022-07-14 NOTE — ED Provider Notes (Signed)
[facility] Provider Note   Event Date/Time   First MD Initiated Contact with Patient 07/14/22 1457     (approximate) History  weakness  HPI Misty Adams is a 67 y.o. female with a past medical history of CHF, COPD, continued tobacco abuse, hypertension, type 2 diabetes, and hypothyroidism who presents complaining of generalized weakness, shortness of breath, and dysuria that is worsening over the last week.  Patient states that she took a home UTI test today that was positive.  Upon EMS arrival, they stated patient's heart rate to be in the 50-100 range and "looked like A-fib".  Patient was also somewhat hypotensive upon their arrival and received fluids en route.  Patient also endorses right rib pain for the last week denies any trauma.  Patient denies any recent travel or sick contacts. ROS: Patient currently denies any vision changes, tinnitus, difficulty speaking, facial droop, sore throat, abdominal pain, nausea/vomiting/diarrhea, or numbness/paresthesias in any extremity   Physical Exam  Triage Vital Signs: ED Triage Vitals  Enc Vitals Group     BP 07/14/22 1411 116/76     Pulse Rate 07/14/22 1411 (!) 117     Resp 07/14/22 1411 20     Temp 07/14/22 1411 99.1 F (37.3 C)     Temp Source 07/14/22 1411 Oral     SpO2 07/14/22 1411 (!) 89 %     Weight 07/14/22 1409 180 lb (81.6 kg)     Height 07/14/22 1409 5\' 4"  (1.626 m)     Head Circumference --      Peak Flow --      Pain Score 07/14/22 1409 5     Pain Loc --      Pain Edu? --      Excl. in [place]? --    Most recent vital signs: Vitals:   07/14/22 1855 07/14/22 1959  BP:  (!) 111/56  Pulse:  (!) 103  Resp:  18  Temp: (!) 100.5 F (38.1 C) 100.3 F (37.9 C)  SpO2:  92%   General: Awake, oriented x4. CV:  Good peripheral perfusion.  Resp:  Normal effort.  Abd:  No distention.  Other:  Middle-aged obese Caucasian female laying in bed in no acute distress ED Results / Procedures / Treatments   Labs (all labs ordered are listed, but only abnormal results are displayed) Labs Reviewed  BASIC METABOLIC PANEL - Abnormal; Notable for the following components:      Result Value   Sodium 132 (*)    Potassium 2.8 (*)    Chloride 91 (*)    Glucose, Bld 188 (*)    BUN 72 (*)    Creatinine, Ser 2.94 (*)    GFR, Estimated 17 (*)    Anion gap 17 (*)    All other components within normal limits  CBC - Abnormal; Notable for the following components:   WBC 36.6 (*)    RBC 5.36 (*)    RDW 15.6 (*)    All other components within normal limits  LACTIC ACID, PLASMA - Abnormal; Notable for the following components:   Lactic Acid, Venous 3.3 (*)    All other components within normal limits  LACTIC ACID, PLASMA - Abnormal; Notable for the following components:   Lactic Acid, Venous 2.8 (*)    All other components within normal limits  URINALYSIS, W/ REFLEX TO CULTURE (INFECTION SUSPECTED) - Abnormal; Notable for the following components:   Color, Urine AMBER (*)    APPearance  CLOUDY (*)    Hgb urine dipstick MODERATE (*)    Protein, ur >=300 (*)    Leukocytes,Ua LARGE (*)    Bacteria, UA FEW (*)    Crystals PRESENT (*)    All other components within normal limits  CBG MONITORING, ED - Abnormal; Notable for the following components:   Glucose-Capillary 137 (*)    All other components within normal limits  TROPONIN I (HIGH SENSITIVITY) - Abnormal; Notable for the following components:   Troponin I (High Sensitivity) 56 (*)    All other components within normal limits  TROPONIN I (HIGH SENSITIVITY) - Abnormal; Notable for the following components:   Troponin I (High Sensitivity) 44 (*)    All other components within normal limits  SARS CORONAVIRUS 2 BY RT PCR  CULTURE, BLOOD (ROUTINE X 2)  CULTURE, BLOOD (ROUTINE X 2)  URINE CULTURE  HEMOGLOBIN A1C  HIV ANTIBODY (ROUTINE TESTING W REFLEX)  BASIC METABOLIC PANEL  CBC   EKG ED ECG REPORT I, Naaman Plummer, the attending  physician, personally viewed and interpreted this ECG. Date: 07/14/2022 EKG Time: 1410 Rate: 117 Rhythm: Tachycardic sinus rhythm QRS Axis: normal Intervals: normal ST/T Wave abnormalities: normal Narrative Interpretation: Tachycardic sinus rhythm.  No evidence of acute ischemia RADIOLOGY ED MD interpretation: 2 view chest x-ray interpreted by me shows no evidence of acute abnormalities including no pneumonia, pneumothorax, or widened mediastinum -Agree with radiology assessment Official radiology report(s): DG Chest 2 View  Result Date: 07/14/2022 CLINICAL DATA:  Chest pain EXAM: CHEST - 2 VIEW COMPARISON:  X-ray 09/19/2020.  CT 06/12/2022 FINDINGS: Hyperinflation. No consolidation, pneumothorax or effusion. No edema. Normal cardiopericardial silhouette. Calcified aorta. Degenerative changes of the spine. Vascular stent along the upper left mediastinum. IMPRESSION: Hyperinflation.  Vascular stent.  No acute cardiopulmonary disease Electronically Signed   By: Jill Side M.D.   On: 07/14/2022 14:50   PROCEDURES: Critical Care performed: Yes, see critical care procedure note(s) .1-3 Lead EKG Interpretation  Performed by: Naaman Plummer, MD Authorized by: Naaman Plummer, MD     Interpretation: abnormal     ECG rate:  112   ECG rate assessment: tachycardic     Rhythm: sinus tachycardia     Ectopy: none     Conduction: normal   CRITICAL CARE Performed by: Naaman Plummer  Total critical care time: 39 minutes  Critical care time was exclusive of separately billable procedures and treating other patients.  Critical care was necessary to treat or prevent imminent or life-threatening deterioration.  Critical care was time spent personally by me on the following activities: development of treatment plan with patient and/or surrogate as well as nursing, discussions with consultants, evaluation of patient's response to treatment, examination of patient, obtaining history from patient or  surrogate, ordering and performing treatments and interventions, ordering and review of laboratory studies, ordering and review of radiographic studies, pulse oximetry and re-evaluation of patient's condition.  MEDICATIONS ORDERED IN ED: Medications  0.9 % NaCl with KCl 20 mEq/ L  infusion (has no administration in time range)  acetaminophen (TYLENOL) tablet 650 mg (650 mg Oral Given 07/14/22 1806)    Or  acetaminophen (TYLENOL) suppository 650 mg ( Rectal See Alternative 07/14/22 1806)  docusate sodium (COLACE) capsule 100 mg (has no administration in time range)  polyethylene glycol (MIRALAX / GLYCOLAX) packet 17 g (has no administration in time range)  ondansetron (ZOFRAN) tablet 4 mg ( Oral See Alternative 07/14/22 1827)    Or  ondansetron (  ZOFRAN) injection 4 mg (4 mg Intravenous Given 07/14/22 1827)  traMADol (ULTRAM) tablet 50 mg (has no administration in time range)  insulin aspart (novoLOG) injection 0-9 Units (1 Units Subcutaneous Given 07/14/22 1807)  insulin aspart (novoLOG) injection 0-5 Units (has no administration in time range)  potassium chloride 10 mEq in 100 mL IVPB (10 mEq Intravenous New Bag/Given 07/14/22 2055)  cefTRIAXone (ROCEPHIN) 2 g in sodium chloride 0.9 % 100 mL IVPB (has no administration in time range)  ipratropium-albuterol (DUONEB) 0.5-2.5 (3) MG/3ML nebulizer solution 3 mL (has no administration in time range)  gabapentin (NEURONTIN) capsule 100 mg (has no administration in time range)  enoxaparin (LOVENOX) injection 30 mg (has no administration in time range)  sodium chloride 0.9 % bolus 1,000 mL (0 mLs Intravenous Stopped 07/14/22 1640)  potassium chloride SA (KLOR-CON M) CR tablet 40 mEq (40 mEq Oral Given 07/14/22 1806)   IMPRESSION / MDM / ASSESSMENT AND PLAN / ED COURSE  I reviewed the triage vital signs and the nursing notes.                             The patient is on the cardiac monitor to evaluate for evidence of arrhythmia and/or significant heart  rate changes. Patient's presentation is most consistent with acute presentation with potential threat to life or bodily function. The Pt presents with fatigue, dysuria highly concerning for sepsis (suspected urinary source). At this time, the Pt is satting well on 2 L, hypotensive, and appears tachycardic.  Will start empiric antibiotics and fluids.  Due to hypotension, will administer fluids gradually with frequent reassessment. Have low suspicion for a GI, skin/soft tissue, or CNS source at this time, but will reconsider if initial workup is unremarkable.  - CBC, BMP, LFTs - VBG - UA positive - BCx x2, Lactate - EKG - CXR - Empiric Abx: Rocephin, azithromycin - Fluids: 30cc/kg NS Dispo: Admit to medicine Clinical Course as of 07/14/22 2142  Mon Jul 14, 2022  1630 Crystals(!): PRESENT [EB]    Clinical Course User Index [EB] Naaman Plummer, MD   FINAL CLINICAL IMPRESSION(S) / ED DIAGNOSES   Final diagnoses:  Sepsis with acute renal failure without septic shock, due to unspecified organism, unspecified acute renal failure type [facility])  Urinary tract infection with hematuria, site unspecified   Rx / DC Orders   ED Discharge Orders     None      Note:  This document was prepared using Dragon voice recognition software and may include unintentional dictation errors.   Naaman Plummer, MD 07/14/22 [phone]

## 2022-07-15 ENCOUNTER — Inpatient Hospital Stay: Payer: Medicare HMO

## 2022-07-15 DIAGNOSIS — A419 Sepsis, unspecified organism: Secondary | ICD-10-CM | POA: Diagnosis not present

## 2022-07-15 DIAGNOSIS — N170 Acute kidney failure with tubular necrosis: Secondary | ICD-10-CM | POA: Diagnosis not present

## 2022-07-15 DIAGNOSIS — D72829 Elevated white blood cell count, unspecified: Secondary | ICD-10-CM | POA: Diagnosis not present

## 2022-07-15 DIAGNOSIS — J449 Chronic obstructive pulmonary disease, unspecified: Secondary | ICD-10-CM | POA: Diagnosis not present

## 2022-07-15 LAB — GLUCOSE, CAPILLARY
Glucose-Capillary: 100 mg/dL — ABNORMAL HIGH (ref 70–99)
Glucose-Capillary: 108 mg/dL — ABNORMAL HIGH (ref 70–99)
Glucose-Capillary: 107 mg/dL — ABNORMAL HIGH (ref 70–99)
Glucose-Capillary: 111 mg/dL — ABNORMAL HIGH (ref 70–99)

## 2022-07-15 LAB — BLOOD CULTURE ID PANEL (REFLEXED) - BCID2

## 2022-07-15 LAB — CBC
HCT: 36.8 % (ref 36.0–46.0)
Hemoglobin: 11.9 g/dL — ABNORMAL LOW (ref 12.0–15.0)
MCH: 27.5 pg (ref 26.0–34.0)
MCHC: 32.3 g/dL (ref 30.0–36.0)
MCV: 85 fL (ref 80.0–100.0)
Platelets: 145 10*3/uL — ABNORMAL LOW (ref 150–400)
RBC: 4.33 MIL/uL (ref 3.87–5.11)
RDW: 16.1 % — ABNORMAL HIGH (ref 11.5–15.5)
WBC: 33.8 10*3/uL — ABNORMAL HIGH (ref 4.0–10.5)
nRBC: 0 % (ref 0.0–0.2)

## 2022-07-15 LAB — BASIC METABOLIC PANEL
Anion gap: 9 (ref 5–15)
BUN: 77 mg/dL — ABNORMAL HIGH (ref 8–23)
CO2: 22 mmol/L (ref 22–32)
Calcium: 7.9 mg/dL — ABNORMAL LOW (ref 8.9–10.3)
Chloride: 101 mmol/L (ref 98–111)
Creatinine, Ser: 3.17 mg/dL — ABNORMAL HIGH (ref 0.44–1.00)
GFR, Estimated: 15 mL/min — ABNORMAL LOW (ref >60)
Glucose, Bld: 115 mg/dL — ABNORMAL HIGH (ref 70–99)
Potassium: 4 mmol/L (ref 3.5–5.1)
Sodium: 132 mmol/L — ABNORMAL LOW (ref 135–145)

## 2022-07-15 LAB — HIV ANTIBODY (ROUTINE TESTING W REFLEX): HIV Screen 4th Generation wRfx: NONREACTIVE

## 2022-07-15 MED ORDER — ATORVASTATIN CALCIUM 20 MG PO TABS
40.0000 mg | ORAL_TABLET | Freq: Every day | ORAL | Status: DC
  Administered 2022-07-15 – 2022-07-17 (×3): 40 mg via ORAL
  Filled 2022-07-15 (×3): qty 2

## 2022-07-15 MED ORDER — MOMETASONE FURO-FORMOTEROL FUM 200-5 MCG/ACT IN AERO
2.0000 | INHALATION_SPRAY | Freq: Two times a day (BID) | RESPIRATORY_TRACT | Status: DC
  Administered 2022-07-15 – 2022-07-18 (×6): 2 via RESPIRATORY_TRACT
  Filled 2022-07-15: qty 8.8

## 2022-07-15 MED ORDER — TRAZODONE HCL 100 MG PO TABS: 100.0000 mg | ORAL_TABLET | Freq: Every day | ORAL | Status: DC | PRN

## 2022-07-15 MED ORDER — VITAMIN D 25 MCG (1000 UNIT) PO TABS
1000.0000 [IU] | ORAL_TABLET | Freq: Every day | ORAL | Status: DC
  Administered 2022-07-16 – 2022-07-18 (×3): 1000 [IU] via ORAL
  Filled 2022-07-15 (×3): qty 1

## 2022-07-15 MED ORDER — IPRATROPIUM BROMIDE 0.02 % IN SOLN
0.5000 mg | Freq: Four times a day (QID) | RESPIRATORY_TRACT | Status: DC
  Administered 2022-07-15: 0.5 mg via RESPIRATORY_TRACT
  Filled 2022-07-15: qty 2.5

## 2022-07-15 MED ORDER — PANTOPRAZOLE SODIUM 40 MG PO TBEC
40.0000 mg | DELAYED_RELEASE_TABLET | Freq: Every day | ORAL | Status: DC
  Administered 2022-07-15 – 2022-07-18 (×4): 40 mg via ORAL
  Filled 2022-07-15 (×4): qty 1

## 2022-07-15 MED ORDER — UMECLIDINIUM BROMIDE 62.5 MCG/ACT IN AEPB
1.0000 | INHALATION_SPRAY | Freq: Every day | RESPIRATORY_TRACT | Status: DC
  Administered 2022-07-15: 1 via RESPIRATORY_TRACT
  Filled 2022-07-15: qty 7

## 2022-07-15 MED ORDER — HEPARIN SODIUM (PORCINE) 5000 UNIT/ML IJ SOLN
5000.0000 [IU] | Freq: Three times a day (TID) | INTRAMUSCULAR | Status: DC
  Administered 2022-07-15 – 2022-07-18 (×8): 5000 [IU] via SUBCUTANEOUS
  Filled 2022-07-15 (×8): qty 1

## 2022-07-15 MED ORDER — PHENOL 1.4 % MT LIQD
1.0000 | OROMUCOSAL | Status: DC | PRN
  Administered 2022-07-15: 1 via OROMUCOSAL
  Filled 2022-07-15: qty 177

## 2022-07-15 MED ORDER — LEVOTHYROXINE SODIUM 50 MCG PO TABS
125.0000 ug | ORAL_TABLET | Freq: Every day | ORAL | Status: DC
  Administered 2022-07-15 – 2022-07-18 (×4): 125 ug via ORAL
  Filled 2022-07-15 (×5): qty 1

## 2022-07-15 MED ORDER — BUDESON-GLYCOPYRROL-FORMOTEROL 160-9-4.8 MCG/ACT IN AERO: 2.0000 | INHALATION_SPRAY | Freq: Two times a day (BID) | RESPIRATORY_TRACT | Status: DC

## 2022-07-15 MED ORDER — FAMOTIDINE 20 MG PO TABS
20.0000 mg | ORAL_TABLET | Freq: Every day | ORAL | Status: DC
  Administered 2022-07-15 – 2022-07-17 (×3): 20 mg via ORAL
  Filled 2022-07-15 (×3): qty 1

## 2022-07-15 MED ORDER — VENLAFAXINE HCL ER 75 MG PO CP24
75.0000 mg | ORAL_CAPSULE | Freq: Every day | ORAL | Status: DC
  Administered 2022-07-15 – 2022-07-18 (×4): 75 mg via ORAL
  Filled 2022-07-15 (×4): qty 1

## 2022-07-15 NOTE — Progress Notes (Signed)
PROGRESS NOTE    Misty Adams   [id number] DOB: 07/09/1955  DOA: 07/14/2022 Date of Service: 07/15/22 PCP: Rusty Aus, MD     Brief Narrative / Hospital Course:  Misty Adams is a 67 y.o. female with medical history significant of COPD with ongoing tobacco abuse, abnormal CT chest consistent with interstitial lung disease with recent pulmonary consultation, depression, diabetes, hypertension, PAD comes to the emergency room accompanied by daughter at bedside with generalized weakness, right flank pain and foul-smelling urine with dysuria. 03/25: Patient was found to be tachycardic, tachypnea with elevated white count of 36,000 and abnormal urinalysis. She received IV Rocephin and Zithromax. Patient's chest x-ray does not show pneumonia. Patient is being admitted for sepsis secondary to UTI.  03/26: UCx (+)Ecoli pending susceptibilities. BCx x2 (+)GNR/Ecoli.   Consultants:  none  Procedures: none      ASSESSMENT & PLAN:   Principal Problem:   Sepsis ([place]) Active Problems:   Cystitis   Acute renal failure with acute tubular necrosis superimposed on stage 3b chronic kidney disease (HCC)   Leukocytosis   Chronic obstructive pulmonary disease (HCC)   Severe Sepsis secondary to acute cystitis without hematuria E coli cystitis E coli bacteremia d/t UTI  Sepsis resolved - WBC still elevated but VS WNL Continue IV Rocephin pending susceptibilities  Repeat BCx  Follow CBC and VS  If worsening, CT abd/pelvis   Acute on chronic kidney disease stage IIIB baseline creatinine around 1.7 came in with creatinine of 2.9 continue IV hydration Monitor renal function    Hypokalemia - resolved Monitor BMP  Hyponatremia Mild Continuing IV fluids for sepsis Monitor BMP   Mild elevated troponin  appears demand ischemia. Denies chest pain Recheck if chest pain or other concern for ACS   Type II diabetes with CKD stage IIIB sliding scale insulin   COPD with  ongoing tobacco abuse abnormal CT chest done as outpatient showing ground glass opacity/interstitial lung disease patient advised smoking cessation Will continue nebulizer/inhalers assess for home oxygen if needed. Patient follows with Dr. Raul Del   Hypertension hold BP meds to avoid hypotension in the setting of sepsis    DVT prophylaxis: heparin Pertinent IV fluids/nutrition: NS 100 mL/h d/t AKI, sepsis Central lines / invasive devices: none  Code Status: FULL CODE  Current Admission Status: inpatient   TOC needs / Dispo plan: none at this time Barriers to discharge / significant pending items: repeat cultures              Subjective / Brief ROS:  Patient reports feeling tired, low appetite bt overall better than yesterday  Denies CP/SOB.  Pain controlled.  Denies new weakness.  Tolerating diet.  Reports no concerns w/ urination/defecation.   Family Communication: none at this time     Objective Findings:  Vitals:   07/14/22 2221 07/14/22 2349 07/15/22 0807 07/15/22 1540  BP: (!) 103/58 101/62 (!) 114/54 112/69  Pulse: 98 91 84 85  Resp: 20 20 20 18   Temp: (!) 97.5 F (36.4 C) 97.6 F (36.4 C) (!) 97.5 F (36.4 C) 98.7 F (37.1 C)  TempSrc:      SpO2: 93% 94% 97% 100%  Weight:      Height:        Intake/Output Summary (Last 24 hours) at 07/15/2022 1637 Last data filed at 07/15/2022 1600 Gross per 24 hour  Intake 2687.74 ml  Output --  Net 2687.74 ml   Filed Weights   07/14/22 1409 07/14/22 1959  Weight: 81.6 kg 83.9 kg    Examination:  Physical Exam Constitutional:      General: She is not in acute distress. Cardiovascular:     Rate and Rhythm: Normal rate and regular rhythm.     Heart sounds: Normal heart sounds.  Pulmonary:     Effort: Pulmonary effort is normal.     Breath sounds: Normal breath sounds.  Abdominal:     General: Abdomen is flat.     Palpations: Abdomen is soft.  Skin:    General: Skin is dry.  Neurological:      General: No focal deficit present.     Mental Status: She is alert.  Psychiatric:        Mood and Affect: Mood normal.        Behavior: Behavior normal.          Scheduled Medications:   atorvastatin  40 mg Oral QHS   Budeson-Glycopyrrol-Formoterol  2 puff Inhalation BID   [START ON 07/16/2022] cholecalciferol  1,000 Units Oral Daily   docusate sodium  100 mg Oral BID   famotidine  20 mg Oral QHS   gabapentin  100 mg Oral QHS   heparin injection (subcutaneous)  5,000 Units Subcutaneous Q8H   insulin aspart  0-5 Units Subcutaneous QHS   insulin aspart  0-9 Units Subcutaneous TID WC   levothyroxine  125 mcg Oral Q0600   pantoprazole  40 mg Oral Daily   venlafaxine XR  75 mg Oral Daily    Continuous Infusions:  0.9 % NaCl with KCl 20 mEq / L Stopped (07/15/22 1032)   cefTRIAXone (ROCEPHIN)  IV 200 mL/hr at 07/15/22 1600    PRN Medications:  acetaminophen **OR** acetaminophen, ipratropium-albuterol, ondansetron **OR** ondansetron (ZOFRAN) IV, polyethylene glycol, traMADol, traZODone  Antimicrobials from admission:  Anti-infectives (From admission, onward)    Start     Dose/Rate Route Frequency Ordered Stop   07/15/22 1500  cefTRIAXone (ROCEPHIN) 2 g in sodium chloride 0.9 % 100 mL IVPB        2 g 200 mL/hr over 30 Minutes Intravenous Every 24 hours 07/14/22 1718     07/14/22 1515  cefTRIAXone (ROCEPHIN) 2 g in sodium chloride 0.9 % 100 mL IVPB  Status:  Discontinued        2 g 200 mL/hr over 30 Minutes Intravenous Every 24 hours 07/14/22 1502 07/14/22 1717   07/14/22 1515  azithromycin (ZITHROMAX) 500 mg in sodium chloride 0.9 % 250 mL IVPB  Status:  Discontinued        500 mg 250 mL/hr over 60 Minutes Intravenous Every 24 hours 07/14/22 1502 07/14/22 1724           Data Reviewed:  I have personally reviewed the following...  CBC: Recent Labs  Lab 07/14/22 1411 07/15/22 0627  WBC 36.6* 33.8*  HGB 14.7 11.9*  HCT 45.6 36.8  MCV 85.1 85.0  PLT 223 145*    Basic Metabolic Panel: Recent Labs  Lab 07/14/22 1411 07/15/22 0627  NA 132* 132*  K 2.8* 4.0  CL 91* 101  CO2 24 22  GLUCOSE 188* 115*  BUN 72* 77*  CREATININE 2.94* 3.17*  CALCIUM 9.0 7.9*   GFR: Estimated Creatinine Clearance: 17.6 mL/min (A) (by C-G formula based on SCr of 3.17 mg/dL (H)). Liver Function Tests: No results for input(s): "AST", "ALT", "ALKPHOS", "BILITOT", "PROT", "ALBUMIN" in the last 168 hours. No results for input(s): "LIPASE", "AMYLASE" in the last 168 hours. No results for input(s): "AMMONIA" in  the last 168 hours. Coagulation Profile: No results for input(s): "INR", "PROTIME" in the last 168 hours. Cardiac Enzymes: No results for input(s): "CKTOTAL", "CKMB", "CKMBINDEX", "TROPONINI" in the last 168 hours. BNP (last 3 results) No results for input(s): "PROBNP" in the last 8760 hours. HbA1C: No results for input(s): "HGBA1C" in the last 72 hours. CBG: Recent Labs  Lab 07/14/22 1807 07/14/22 2142 07/15/22 0809 07/15/22 1206 07/15/22 1537  GLUCAP 137* 122* 100* 108* 107*   Lipid Profile: No results for input(s): "CHOL", "HDL", "LDLCALC", "TRIG", "CHOLHDL", "LDLDIRECT" in the last 72 hours. Thyroid Function Tests: No results for input(s): "TSH", "T4TOTAL", "FREET4", "T3FREE", "THYROIDAB" in the last 72 hours. Anemia Panel: No results for input(s): "VITAMINB12", "FOLATE", "FERRITIN", "TIBC", "IRON", "RETICCTPCT" in the last 72 hours. Most Recent Urinalysis On File:     Component Value Date/Time   COLORURINE AMBER (A) 07/14/2022 1524   APPEARANCEUR CLOUDY (A) 07/14/2022 1524   LABSPEC 1.013 07/14/2022 1524   PHURINE 5.0 07/14/2022 1524   GLUCOSEU NEGATIVE 07/14/2022 1524   HGBUR MODERATE (A) 07/14/2022 1524   BILIRUBINUR NEGATIVE 07/14/2022 1524   KETONESUR NEGATIVE 07/14/2022 1524   PROTEINUR >=300 (A) 07/14/2022 1524   NITRITE NEGATIVE 07/14/2022 1524   LEUKOCYTESUR LARGE (A) 07/14/2022 1524   Sepsis  Labs: @LABRCNTIP (procalcitonin:4,lacticidven:4) Microbiology: Recent Results (from the past 240 hour(s))  Culture, blood (routine x 2)     Status: None (Preliminary result)   Collection Time: 07/14/22  2:50 PM   Specimen: BLOOD  Result Value Ref Range Status   Specimen Description BLOOD RIGHT ANTECUBITAL  Final   Special Requests   Final    BOTTLES DRAWN AEROBIC AND ANAEROBIC Blood Culture adequate volume   Culture  Setup Time   Final    GRAM NEGATIVE RODS IN BOTH AEROBIC AND ANAEROBIC BOTTLES CRITICAL VALUE NOTED.  VALUE IS CONSISTENT WITH PREVIOUSLY REPORTED AND CALLED VALUE. Performed at [facility], [address] [place], [place] [address]    Culture GRAM NEGATIVE RODS  Final   Report Status PENDING  Incomplete  Culture, blood (routine x 2)     Status: None (Preliminary result)   Collection Time: 07/14/22  2:50 PM   Specimen: BLOOD  Result Value Ref Range Status   Specimen Description   Final    BLOOD BLOOD RIGHT FOREARM Performed at [facility], [address] [place], [place] [address]    Special Requests   Final    BOTTLES DRAWN AEROBIC AND ANAEROBIC Blood Culture adequate volume Performed at [facility], [address] [place], [place] [address]    Culture  Setup Time   Final    GRAM NEGATIVE RODS IN BOTH AEROBIC AND ANAEROBIC BOTTLES CRITICAL RESULT CALLED TO, READ BACK BY AND VERIFIED WITH: JASON ROBBINS@0400  07/15/22 RH Performed at [facility], [place] [address] [place],  [address]    Culture GRAM NEGATIVE RODS  Final   Report Status PENDING  Incomplete  Blood Culture ID Panel (Reflexed)     Status: Abnormal   Collection Time: 07/14/22  2:50 PM  Result Value Ref Range Status   Enterococcus faecalis NOT DETECTED NOT DETECTED Final   Enterococcus Faecium NOT DETECTED NOT DETECTED Final   Listeria monocytogenes NOT DETECTED NOT DETECTED Final   Staphylococcus species NOT DETECTED NOT DETECTED Final    Staphylococcus aureus (BCID) NOT DETECTED NOT DETECTED Final   Staphylococcus epidermidis NOT DETECTED NOT DETECTED Final   Staphylococcus lugdunensis NOT DETECTED NOT DETECTED Final   Streptococcus  species NOT DETECTED NOT DETECTED Final   Streptococcus agalactiae NOT DETECTED NOT DETECTED Final   Streptococcus pneumoniae NOT DETECTED NOT DETECTED Final   Streptococcus pyogenes NOT DETECTED NOT DETECTED Final   A.calcoaceticus-baumannii NOT DETECTED NOT DETECTED Final   Bacteroides fragilis NOT DETECTED NOT DETECTED Final   Enterobacterales DETECTED (A) NOT DETECTED Final    Comment: Enterobacterales represent a large order of gram negative bacteria, not a single organism. CRITICAL RESULT CALLED TO, READ BACK BY AND VERIFIED WITH: JASON ROBBINS@03 /26/24 RH    Enterobacter cloacae complex NOT DETECTED NOT DETECTED Final   Escherichia coli DETECTED (A) NOT DETECTED Final    Comment: CRITICAL RESULT CALLED TO, READ BACK BY AND VERIFIED WITH: JASON ROBBINS@0400  07/15/22 RH    Klebsiella aerogenes NOT DETECTED NOT DETECTED Final   Klebsiella oxytoca NOT DETECTED NOT DETECTED Final   Klebsiella pneumoniae NOT DETECTED NOT DETECTED Final   Proteus species NOT DETECTED NOT DETECTED Final   Salmonella species NOT DETECTED NOT DETECTED Final   Serratia marcescens NOT DETECTED NOT DETECTED Final   Haemophilus influenzae NOT DETECTED NOT DETECTED Final   Neisseria meningitidis NOT DETECTED NOT DETECTED Final   Pseudomonas aeruginosa NOT DETECTED NOT DETECTED Final   Stenotrophomonas maltophilia NOT DETECTED NOT DETECTED Final   Candida albicans NOT DETECTED NOT DETECTED Final   Candida auris NOT DETECTED NOT DETECTED Final   Candida glabrata NOT DETECTED NOT DETECTED Final   Candida krusei NOT DETECTED NOT DETECTED Final   Candida parapsilosis NOT DETECTED NOT DETECTED Final   Candida tropicalis NOT DETECTED NOT DETECTED Final   Cryptococcus neoformans/gattii NOT DETECTED NOT DETECTED Final    CTX-M ESBL NOT DETECTED NOT DETECTED Final   Carbapenem resistance IMP NOT DETECTED NOT DETECTED Final   Carbapenem resistance KPC NOT DETECTED NOT DETECTED Final   Carbapenem resistance NDM NOT DETECTED NOT DETECTED Final   Carbapenem resist OXA 48 LIKE NOT DETECTED NOT DETECTED Final   Carbapenem resistance VIM NOT DETECTED NOT DETECTED Final    Comment: Performed at [facility], [address] [place], [place] [address]  Urine Culture     Status: Abnormal (Preliminary result)   Collection Time: 07/14/22  3:24 PM   Specimen: Urine, Random  Result Value Ref Range Status   Specimen Description   Final    URINE, RANDOM Performed at [facility], [address] [place], [place] [address]    Special Requests   Final    NONE Reflexed from [id number] Performed at [facility], [address] [place], [place] [address]    Culture (A)  Final    >=100,000 COLONIES/mL ESCHERICHIA COLI SUSCEPTIBILITIES TO FOLLOW Performed at [facility], [address] [place], [place] [address]    Report Status PENDING  Incomplete  SARS Coronavirus 2 by RT PCR (hospital order, performed in [facility] hospital lab) *cepheid single result test* Anterior Nasal Swab     Status: None   Collection Time: 07/14/22  3:25 PM   Specimen: Anterior Nasal Swab  Result Value Ref Range Status   SARS Coronavirus 2 by RT PCR NEGATIVE NEGATIVE Final    Comment: (NOTE) SARS-CoV-2 target nucleic acids are NOT DETECTED.  The SARS-CoV-2 RNA is generally detectable in upper and lower respiratory specimens during the acute phase of infection. The lowest concentration of SARS-CoV-2 viral copies this assay can detect is 250 copies / mL. A negative result does not preclude SARS-CoV-2 infection and should not be used as the sole basis  for treatment or other patient management decisions.  A negative result may occur with improper specimen collection / handling, submission of specimen  other than nasopharyngeal swab, presence of viral mutation(s) within the areas targeted by this assay, and inadequate number of viral copies (<250 copies / mL). A negative result must be combined with clinical observations, patient history, and epidemiological information.  Fact Sheet for Patients:   https://www.patel.info/  Fact Sheet for Healthcare Providers: https://hall.com/  This test is not yet approved or  cleared by the [place] FDA and has been authorized for detection and/or diagnosis of SARS-CoV-2 by FDA under an Emergency Use Authorization (EUA).  This EUA will remain in effect (meaning this test can be used) for the duration of the COVID-19 declaration under Section 564(b)(1) of the Act, 21 U.S.C. section 360bbb-3(b)(1), unless the authorization is terminated or revoked sooner.  Performed at [facility], [address], [place], [place] [address]       Radiology Studies last 3 days: DG Chest 2 View  Result Date: 07/14/2022 CLINICAL DATA:  Chest pain EXAM: CHEST - 2 VIEW COMPARISON:  X-ray 09/19/2020.  CT 06/12/2022 FINDINGS: Hyperinflation. No consolidation, pneumothorax or effusion. No edema. Normal cardiopericardial silhouette. Calcified aorta. Degenerative changes of the spine. Vascular stent along the upper left mediastinum. IMPRESSION: Hyperinflation.  Vascular stent.  No acute cardiopulmonary disease Electronically Signed   By: Jill Side M.D.   On: 07/14/2022 14:50             LOS: 1 day       Emeterio Reeve, DO [facility] 07/15/2022, 4:37 PM    Dictation software may have been used to generate the above note. Typos may occur and escape review in typed/dictated notes. Please contact Dr Sheppard Coil directly for clarity if needed.  Staff may message me via secure chat in [place]  but this may not receive an immediate response,  please page me for urgent matters!  If 7PM-7AM,  please contact night coverage [email or web address]

## 2022-07-15 NOTE — Evaluation (Signed)
Physical Therapy Evaluation Patient Details Name: Misty Adams MRN: [id number] DOB: 1956/03/03 Today's Date: 07/15/2022  History of Present Illness  Patient is a 67 year old female with admitted for sepsis secondary to UTI. History of COPD, ongoing tobacco abuse  Clinical Impression  Patient is agreeable to PT evaluation. She is independent with mobility prior to admission and ambulated without assistive device. She lives with her family.  The patient reports generalized weakness today with mobility. She required no physical assistance with bed mobility, transfers, or hallway ambulation. The patient walked in the hallway with occasional use of IV pole for comfort. Sp02 90% on room air after walking without significant shortness of breath noted. PT will continue to follow while in the hospital to maximize independence.      Recommendations for follow up therapy are one component of a multi-disciplinary discharge planning process, led by the attending physician.  Recommendations may be updated based on patient status, additional functional criteria and insurance authorization.  Follow Up Recommendations       Assistance Recommended at Discharge PRN  Patient can return home with the following  Assist for transportation    Equipment Recommendations None recommended by PT  Recommendations for Other Services       Functional Status Assessment Patient has had a recent decline in their functional status and demonstrates the ability to make significant improvements in function in a reasonable and predictable amount of time.     Precautions / Restrictions Precautions Precautions: Fall Restrictions Weight Bearing Restrictions: No      Mobility  Bed Mobility Overal bed mobility: Needs Assistance Bed Mobility: Supine to Sit     Supine to sit: Supervision          Transfers Overall transfer level: Needs assistance   Transfers: Sit to/from Stand Sit to Stand: Supervision            General transfer comment: verbal cues for hand placement    Ambulation/Gait Ambulation/Gait assistance: Supervision Gait Distance (Feet): 100 Feet Assistive device: None (intermittent use of IV pole per patient preference) Gait Pattern/deviations: Step-through pattern Gait velocity: decreased     General Gait Details: no loss of balance with ambulation. activity tolerance limited by fatigue. Sp02 90% on room air after walking  Stairs            Wheelchair Mobility    Modified Rankin (Stroke Patients Only)       Balance Overall balance assessment: Needs assistance Sitting-balance support: Feet supported Sitting balance-Leahy Scale: Good Sitting balance - Comments: patient able to complete peri-hygiene without loss of balance   Standing balance support: No upper extremity supported Standing balance-Leahy Scale: Fair                               Pertinent Vitals/Pain Pain Assessment Pain Assessment: Faces Faces Pain Scale: Hurts a little bit Pain Location: back Pain Descriptors / Indicators: Discomfort Pain Intervention(s): Limited activity within patient's tolerance, Monitored during session, Repositioned    Home Living Family/patient expects to be discharged to:: Private residence Living Arrangements: Children Available Help at Discharge: Family Type of Home: House Home Access: Level entry       Home Layout: Two level;Able to live on main level with bedroom/bathroom Home Equipment: None      Prior Function Prior Level of Function : Independent/Modified Independent  Hand Dominance        Extremity/Trunk Assessment   Upper Extremity Assessment Upper Extremity Assessment: Generalized weakness    Lower Extremity Assessment Lower Extremity Assessment: Generalized weakness       Communication   Communication: No difficulties  Cognition Arousal/Alertness: Awake/alert Behavior During Therapy: WFL  for tasks assessed/performed Overall Cognitive Status: Within Functional Limits for tasks assessed                                          General Comments General comments (skin integrity, edema, etc.): patient endorses generalized weakness.    Exercises     Assessment/Plan    PT Assessment Patient needs continued PT services  PT Problem List Decreased strength;Decreased activity tolerance;Decreased balance;Decreased mobility       PT Treatment Interventions DME instruction;Gait training;Functional mobility training;Stair training;Therapeutic activities;Therapeutic exercise;Balance training;Neuromuscular re-education;Patient/family education    PT Goals (Current goals can be found in the Care Plan section)  Acute Rehab PT Goals Patient Stated Goal: to go home PT Goal Formulation: With patient Time For Goal Achievement: 07/29/22 Potential to Achieve Goals: Fair    Frequency Min 2X/week     Co-evaluation               AM-PAC PT "6 Clicks" Mobility  Outcome Measure Help needed turning from your back to your side while in a flat bed without using bedrails?: None Help needed moving from lying on your back to sitting on the side of a flat bed without using bedrails?: None Help needed moving to and from a bed to a chair (including a wheelchair)?: A Little Help needed standing up from a chair using your arms (e.g., wheelchair or bedside chair)?: A Little Help needed to walk in hospital room?: A Little Help needed climbing 3-5 steps with a railing? : A Little 6 Click Score: 20    End of Session Equipment Utilized During Treatment: Gait belt Activity Tolerance: Patient tolerated treatment well Patient left: in chair;with call bell/phone within reach;with chair alarm set Nurse Communication: Mobility status (white updated with mobility status) PT Visit Diagnosis: Muscle weakness (generalized) (M62.81);Unsteadiness on feet (R26.81)    Time:  [id number] PT Time Calculation (min) (ACUTE ONLY): 25 min   Charges:   PT Evaluation $PT Eval Low Complexity: 1 Low PT Treatments $Therapeutic Activity: 8-22 mins        Minna Merritts, PT, MPT   Percell Locus 07/15/2022, 10:51 AM

## 2022-07-15 NOTE — Evaluation (Signed)
Occupational Therapy Evaluation Patient Details Name: SHELLYANN VEENSTRA MRN: [id number] DOB: [birth year] Today's Date: 07/15/2022   History of Present Illness Patient is a 67 year old female admitted for sepsis secondary to UTI. History of COPD, ongoing tobacco abuse   Clinical Impression   Ms. Proa has been IND in fxl mobility PTA, ambulating w/o AD, driving, performing B/IADL; however, she reports that she rarely leaves the house. She orders groceries and other supplies online and has them delivered. She lives with her daughter and 2 sons. She reports general weakness and fatigue today but is close to her baseline level of fxl mobility. She is able to perform bed mobility, transfers, grooming, toileting, dressing all with Mod I-SUPV, demonstrates good balance and safety awareness. Provided educ on home/routines modifications, smoking cessation/harm reduction, and pet care management, as pt reports it is sometimes difficult for her to care for the 4 mischievous dogs she has at home. Pt's O2 sats were at 99% on 2L [place]. Removed O2, as pt does not use O2 at baseline. RN notified. Pt states she does not believe she will need any OT services post-DC, although pt could benefit from ongoing OT while hospitalized, to improve strength, endurance, and balance.     Recommendations for follow up therapy are one component of a multi-disciplinary discharge planning process, led by the attending physician.  Recommendations may be updated based on patient status, additional functional criteria and insurance authorization.   Assistance Recommended at Discharge PRN  Patient can return home with the following Assistance with cooking/housework;Assist for transportation    Functional Status Assessment  Patient has had a recent decline in their functional status and demonstrates the ability to make significant improvements in function in a reasonable and predictable amount of time.  Equipment Recommendations  None  recommended by OT    Recommendations for Other Services       Precautions / Restrictions Precautions Precautions: Fall Restrictions Weight Bearing Restrictions: No      Mobility Bed Mobility Overal bed mobility: Modified Independent Bed Mobility: Supine to Sit     Supine to sit: Modified independent (Device/Increase time)          Transfers Overall transfer level: Needs assistance Equipment used: Rolling walker (2 wheels) Transfers: Sit to/from Stand Sit to Stand: Supervision           General transfer comment: verbal cues for hand placement      Balance Overall balance assessment: Needs assistance Sitting-balance support: Feet supported Sitting balance-Leahy Scale: Good Sitting balance - Comments: patient able to complete peri-hygiene without loss of balance   Standing balance support: No upper extremity supported Standing balance-Leahy Scale: Good                             ADL either performed or assessed with clinical judgement   ADL Overall ADL's : Needs assistance/impaired Eating/Feeding: Modified independent   Grooming: Wash/dry face;Wash/dry hands;Supervision/safety;Standing                   Toilet Transfer: Supervision/safety;Regular Toilet;Rolling walker (2 wheels)   Toileting- Water quality scientist and Hygiene: Modified independent;Sitting/lateral Health and safety inspector      Pertinent Vitals/Pain Pain Assessment Pain Assessment: No/denies pain     Hand Dominance     Extremity/Trunk Assessment Upper Extremity  Assessment Upper Extremity Assessment: Generalized weakness   Lower Extremity Assessment Lower Extremity Assessment: Generalized weakness   Cervical / Trunk Assessment Cervical / Trunk Assessment: Normal   Communication Communication Communication: No difficulties   Cognition Arousal/Alertness: Awake/alert Behavior During Therapy: WFL for tasks  assessed/performed Overall Cognitive Status: Within Functional Limits for tasks assessed                                       General Comments       Exercises Other Exercises Other Exercises: Educ re: routines/home modifications, falls prevention, ECS, smoking cessation/harm reduction, pet care mgmt   Shoulder Instructions      Home Living Family/patient expects to be discharged to:: Private residence Living Arrangements: Children Available Help at Discharge: Family Type of Home: House Home Access: Level entry     Home Layout: Two level;Able to live on main level with bedroom/bathroom     Bathroom Shower/Tub: Occupational psychologist: Standard     Home Equipment: None          Prior Functioning/Environment Prior Level of Function : Independent/Modified Independent             Mobility Comments: Ambulates without AD. 1 fall in previous year. ADLs Comments: Performs B/IADL with Mod I. Drives. Orders groceries on-line, w/ delivery. Reports she seldom leaves the house.        OT Problem List: Decreased strength;Decreased activity tolerance;Impaired balance (sitting and/or standing)      OT Treatment/Interventions: Self-care/ADL training;Therapeutic exercise;Patient/family education;Balance training;Energy conservation;Therapeutic activities    OT Goals(Current goals can be found in the care plan section) Acute Rehab OT Goals Patient Stated Goal: to go home OT Goal Formulation: With patient Time For Goal Achievement: 07/29/22 Potential to Achieve Goals: Good  OT Frequency: Min 2X/week    Co-evaluation              AM-PAC OT "6 Clicks" Daily Activity     Outcome Measure Help from another person eating meals?: None Help from another person taking care of personal grooming?: A Little Help from another person toileting, which includes using toliet, bedpan, or urinal?: A Little Help from another person bathing (including washing,  rinsing, drying)?: A Little Help from another person to put on and taking off regular upper body clothing?: None Help from another person to put on and taking off regular lower body clothing?: None 6 Click Score: 21   End of Session Equipment Utilized During Treatment: Rolling walker (2 wheels) Nurse Communication: Mobility status  Activity Tolerance: Patient tolerated treatment well Patient left: in chair;with family/visitor present;with call bell/phone within reach  OT Visit Diagnosis: Unsteadiness on feet (R26.81);Muscle weakness (generalized) (M62.81)                Time: [id number] OT Time Calculation (min): 17 min Charges:  OT General Charges $OT Visit: 1 Visit OT Evaluation $OT Eval Low Complexity: 1 Low OT Treatments $Self Care/Home Management : 8-22 mins Josiah Lobo, PhD, MS, OTR/L 07/15/22, 3:30 PM

## 2022-07-15 NOTE — TOC Progression Note (Signed)
Transition of Care [facility]) - Progression Note    Patient Details  Name: Misty Adams MRN: [id number] Date of Birth: 18-Feb-1956  Transition of Care [facility]) CM/SW [place], RN Phone Number: 07/15/2022, 3:36 PM  Clinical Narrative:     Transition of Care (TOC) Screening Note   Patient Details  Name: Misty Adams Date of Birth: [birth year]   Transition of Care [facility]) CM/SW Contact:    Conception Oms, RN Phone Number: 07/15/2022, 3:36 PM  The patient lives at home with her family, she is independent at home, Has PCP with [facility], no TOC needs  Transition of Care Department [facility]) has reviewed patient and no TOC needs have been identified at this time. We will continue to monitor patient advancement through interdisciplinary progression rounds. If new patient transition needs arise, please place a TOC consult.     Expected Discharge Plan: Home/Self Care Barriers to Discharge: No Barriers Identified  Expected Discharge Plan and Services   Discharge Planning Services: CM Consult   Living arrangements for the past 2 months: Single Family Home                 DME Arranged: N/A         HH Arranged: NA           Social Determinants of Health (SDOH) Interventions SDOH Screenings   Food Insecurity: No Food Insecurity (07/14/2022)  Housing: Low Risk  (07/14/2022)  Transportation Needs: No Transportation Needs (07/14/2022)  Utilities: Not At Risk (07/14/2022)  Tobacco Use: High Risk (07/14/2022)    Readmission Risk Interventions     No data to display

## 2022-07-15 NOTE — Inpatient Diabetes Management (Signed)
Inpatient Diabetes Program Recommendations  AACE/ADA: New Consensus Statement on Inpatient Glycemic Control   Target Ranges:  Prepandial:   less than 140 mg/dL      Peak postprandial:   less than 180 mg/dL (1-2 hours)      Critically ill patients:  140 - 180 mg/dL    Latest Reference Range & Units 07/14/22 18:07 07/14/22 21:42 07/15/22 08:09  Glucose-Capillary 70 - 99 mg/dL 137 (H) 122 (H) 100 (H)    Latest Reference Range & Units 07/14/22 14:11  Glucose 70 - 99 mg/dL 188 (H)   Review of Glycemic Control  Diabetes history: DM2 Outpatient Diabetes medications: None Current orders for Inpatient glycemic control: Novolog 0-9 units TID with meals, Novolog 0-5 units QHS  Inpatient Diabetes Program Recommendations:    HbgA1C: A1C 5.9% on 04/17/22 per Care Everywhere.   NOTE: Noted consult for DM2. Patient admitted with sepsis due to acute cystitis, acute on chronic kidney dx, hypokalemia, mild elevated troponin, and COPD. Initial glucose 188 mg/dl on 07/14/22 and fasting glucose 100 mg/dl today. In reviewing chart, noted patient seen PCP on 06/03/22 and patient was noted to be having hypoglycemia so Metformin 500 mg daily was discontinued at that time. Would recommend for patient to continue to follow up with PCP regarding DM control. Agree with current Novolog correction orders. Will follow along.  Thanks, Barnie Alderman, RN, MSN, [place] Diabetes Coordinator Inpatient Diabetes Program [phone] (Team Pager from 8am to [place])

## 2022-07-15 NOTE — Hospital Course (Addendum)
Misty Adams is a 67 y.o. female with medical history significant of COPD with ongoing tobacco abuse, abnormal CT chest consistent with interstitial lung disease with recent pulmonary consultation, depression, diabetes, hypertension, PAD comes to the emergency room accompanied by daughter at bedside with generalized weakness, right flank pain and foul-smelling urine with dysuria. 03/25: Patient was found to be tachycardic, tachypnea with elevated white count of 36,000 and abnormal urinalysis. She received IV Rocephin and Zithromax. Patient's chest x-ray does not show pneumonia. Patient is being admitted for sepsis secondary to UTI.  03/26: UCx (+)Ecoli pending susceptibilities. BCx x2 (+)GNR/Ecoli.  3/27.  E. coli growing out of urine and blood cultures.  Urine sensitivities resulted but blood culture sensitivity still pending.  Creatinine 2.58. 3/28.  Creatinine down to 1.94.  Patient still having pain in her back.  Will continue IV Rocephin today and reassess tomorrow.  Blood cultures and urine culture sensitivities are the same so this is the same organism causing the infection. 3/29.  Patient interested in going home.  Creatinine only improved to 1.92.  Patient given a dose of Rocephin prior to discharge and be switched over to Keflex to complete a total of 14 days.

## 2022-07-15 NOTE — Progress Notes (Signed)
PHARMACY - PHYSICIAN COMMUNICATION CRITICAL VALUE ALERT - BLOOD CULTURE IDENTIFICATION (BCID)  Misty Adams is an 67 y.o. female who presented to [facility] on 07/14/2022 with a chief complaint of sepsis  Assessment:  E Coli in 4 of 4 bottles, no resistance detected (include suspected source if known)  Name of physician (or Provider) Contacted: Neomia Glass, NP  Current antibiotics: Ceftriaxone 2 gm IV Q24H   Changes to prescribed antibiotics recommended:  Patient is on recommended antibiotics - No changes needed  No results found for this or any previous visit.  [name] D 07/15/2022  4:05 AM

## 2022-07-16 DIAGNOSIS — N10 Acute tubulo-interstitial nephritis: Secondary | ICD-10-CM

## 2022-07-16 DIAGNOSIS — E872 Acidosis, unspecified: Secondary | ICD-10-CM | POA: Diagnosis not present

## 2022-07-16 DIAGNOSIS — N170 Acute kidney failure with tubular necrosis: Secondary | ICD-10-CM | POA: Diagnosis not present

## 2022-07-16 DIAGNOSIS — A419 Sepsis, unspecified organism: Secondary | ICD-10-CM | POA: Diagnosis not present

## 2022-07-16 DIAGNOSIS — E039 Hypothyroidism, unspecified: Secondary | ICD-10-CM | POA: Insufficient documentation

## 2022-07-16 DIAGNOSIS — D696 Thrombocytopenia, unspecified: Secondary | ICD-10-CM

## 2022-07-16 DIAGNOSIS — F32A Depression, unspecified: Secondary | ICD-10-CM | POA: Insufficient documentation

## 2022-07-16 DIAGNOSIS — I10 Essential (primary) hypertension: Secondary | ICD-10-CM | POA: Insufficient documentation

## 2022-07-16 DIAGNOSIS — E785 Hyperlipidemia, unspecified: Secondary | ICD-10-CM | POA: Insufficient documentation

## 2022-07-16 DIAGNOSIS — J439 Emphysema, unspecified: Secondary | ICD-10-CM

## 2022-07-16 DIAGNOSIS — F419 Anxiety disorder, unspecified: Secondary | ICD-10-CM

## 2022-07-16 LAB — BASIC METABOLIC PANEL
Anion gap: 9 (ref 5–15)
BUN: 80 mg/dL — ABNORMAL HIGH (ref 8–23)
CO2: 20 mmol/L — ABNORMAL LOW (ref 22–32)
Calcium: 8.2 mg/dL — ABNORMAL LOW (ref 8.9–10.3)
Chloride: 103 mmol/L (ref 98–111)
Creatinine, Ser: 2.58 mg/dL — ABNORMAL HIGH (ref 0.44–1.00)
GFR, Estimated: 20 mL/min — ABNORMAL LOW (ref >60)
Glucose, Bld: 99 mg/dL (ref 70–99)
Potassium: 4 mmol/L (ref 3.5–5.1)
Sodium: 132 mmol/L — ABNORMAL LOW (ref 135–145)

## 2022-07-16 LAB — GLUCOSE, CAPILLARY
Glucose-Capillary: 86 mg/dL (ref 70–99)
Glucose-Capillary: 173 mg/dL — ABNORMAL HIGH (ref 70–99)
Glucose-Capillary: 93 mg/dL (ref 70–99)
Glucose-Capillary: 94 mg/dL (ref 70–99)

## 2022-07-16 LAB — HEMOGLOBIN A1C
Hgb A1c MFr Bld: 7.3 % — ABNORMAL HIGH (ref 4.8–5.6)
Mean Plasma Glucose: 163 mg/dL

## 2022-07-16 LAB — CBC
HCT: 36.5 % (ref 36.0–46.0)
Hemoglobin: 11.6 g/dL — ABNORMAL LOW (ref 12.0–15.0)
MCH: 27.9 pg (ref 26.0–34.0)
MCHC: 31.8 g/dL (ref 30.0–36.0)
MCV: 87.7 fL (ref 80.0–100.0)
Platelets: 116 10*3/uL — ABNORMAL LOW (ref 150–400)
RBC: 4.16 MIL/uL (ref 3.87–5.11)
RDW: 16.3 % — ABNORMAL HIGH (ref 11.5–15.5)
WBC: 17.4 10*3/uL — ABNORMAL HIGH (ref 4.0–10.5)
nRBC: 0 % (ref 0.0–0.2)

## 2022-07-16 LAB — URINE CULTURE: Culture: >=100000 — AB

## 2022-07-16 LAB — LACTIC ACID, PLASMA: Lactic Acid, Venous: 1.6 mmol/L (ref 0.5–1.9)

## 2022-07-16 NOTE — Assessment & Plan Note (Signed)
On Effexor 

## 2022-07-16 NOTE — Assessment & Plan Note (Signed)
On Lipitor 

## 2022-07-16 NOTE — Assessment & Plan Note (Signed)
Respiratory status stable. 

## 2022-07-16 NOTE — Progress Notes (Signed)
Progress Note   Patient: Misty Adams [id number] DOB: [birth year] DOA: 07/14/2022     2 DOS: the patient was seen and examined on 07/16/2022   Brief hospital course: Misty Adams is a 67 y.o. female with medical history significant of COPD with ongoing tobacco abuse, abnormal CT chest consistent with interstitial lung disease with recent pulmonary consultation, depression, diabetes, hypertension, PAD comes to the emergency room accompanied by daughter at bedside with generalized weakness, right flank pain and foul-smelling urine with dysuria. 03/25: Patient was found to be tachycardic, tachypnea with elevated white count of 36,000 and abnormal urinalysis. She received IV Rocephin and Zithromax. Patient's chest x-ray does not show pneumonia. Patient is being admitted for sepsis secondary to UTI.  03/26: UCx (+)Ecoli pending susceptibilities. BCx x2 (+)GNR/Ecoli.  3/27.  E. coli growing out of urine and blood cultures.  Urine sensitivities resulted but blood culture sensitivity still pending.  Creatinine 2.58.          Assessment and Plan: Severe sepsis ([place]) Present on admission.  E. coli growing out of both blood and urine cultures, acute right-sided pyelonephritis.  Continue Rocephin.  Patient also has acute kidney injury.  Patient had leukocytosis tachycardia and elevated lactic acid on presentation.  Patient now has thrombocytopenia.  Acute pyelonephritis Perinephric stranding seen on CT scan of the right kidney.  Continue antibiotics to treat E. coli sepsis.  Acute renal failure with acute tubular necrosis superimposed on stage 3b chronic kidney disease (HCC) Creatinine peaked at 3.17 on 3/26.  Creatinine down to 2.58 on 3/27.  Lactic acidosis Secondary to sepsis  Thrombocytopenia (HCC) Secondary to sepsis  Anxiety and depression On Effexor  Hypothyroidism, unspecified On levothyroxine  Hyperlipidemia, unspecified On Lipitor  Essential hypertension Holding  blood pressure medications  Chronic obstructive pulmonary disease (HCC) Respiratory status stable        Subjective: Patient feels a little weak.  Admitted with sepsis with E. coli growing in the blood and urine cultures.  Physical Exam: Vitals:   07/15/22 2004 07/15/22 2324 07/16/22 0819 07/16/22 1020  BP:  96/61 130/89   Pulse:  77 79   Resp:  20 17   Temp:  98.2 F (36.8 C) 97.7 F (36.5 C)   TempSrc:      SpO2: 91% 96% 99% 93%  Weight:      Height:       Physical Exam HENT:     Head: Normocephalic.     Mouth/Throat:     Pharynx: No oropharyngeal exudate.  Eyes:     General: Lids are normal.     Conjunctiva/sclera: Conjunctivae normal.  Cardiovascular:     Rate and Rhythm: Normal rate and regular rhythm.     Heart sounds: Normal heart sounds, S1 normal and S2 normal.  Pulmonary:     Breath sounds: No decreased breath sounds, wheezing, rhonchi or rales.  Abdominal:     Palpations: Abdomen is soft.     Tenderness: There is no abdominal tenderness.  Musculoskeletal:     Right lower leg: No swelling.     Left lower leg: No swelling.  Skin:    General: Skin is warm.     Comments: Bruising on arms  Neurological:     Mental Status: She is alert and oriented to person, place, and time.     Data Reviewed: Sodium 132, creatinine 2.58, hemoglobin 11.6, white blood cell count 17.4, platelet count 116  Family Communication: Updated patient's daughter on the phone  Disposition: Status  is: Inpatient Remains inpatient appropriate because: Continue IV antibiotics for severe sepsis infection blood and urine cultures.  Planned Discharge Destination: Home    Time spent: 28 minutes  Author: Loletha Grayer, MD 07/16/2022 2:48 PM  For on call review [email or web address].

## 2022-07-16 NOTE — Progress Notes (Signed)
Physical Therapy Treatment Patient Details Name: Misty Adams MRN: [id number] DOB: [birth year] Today's Date: 07/16/2022   History of Present Illness Patient is a 67 year old female admitted for sepsis secondary to UTI. History of COPD, ongoing tobacco abuse    PT Comments    Patient is agreeable to PT session. She was on 2 L02 on arrival to room with Sp02 99%. Removed supplemental oxygen and Sp02 93-95% on room air with hallway ambulation. Patient continues to endorse generalized weakness and was mildly fatigued after walking. No physical assistance required with hallway ambulation, however patient is mildly unsteady at times and occasionally reaches for furniture/wall for support. She declined the need for rolling walker. PT will continue to follow while in the hospital to maximize independence.     Recommendations for follow up therapy are one component of a multi-disciplinary discharge planning process, led by the attending physician.  Recommendations may be updated based on patient status, additional functional criteria and insurance authorization.  Follow Up Recommendations       Assistance Recommended at Discharge PRN  Patient can return home with the following Assist for transportation   Equipment Recommendations  None recommended by PT    Recommendations for Other Services       Precautions / Restrictions Precautions Precautions: Fall Restrictions Weight Bearing Restrictions: No     Mobility  Bed Mobility Overal bed mobility: Needs Assistance Bed Mobility: Supine to Sit, Sit to Supine     Supine to sit: Modified independent (Device/Increase time)     General bed mobility comments: increased time and effort provided    Transfers Overall transfer level: Needs assistance Equipment used: Rolling walker (2 wheels) Transfers: Sit to/from Stand Sit to Stand: Supervision           General transfer comment: verbal cues for technique. mild unsteadiness  initially with standing    Ambulation/Gait Ambulation/Gait assistance: Supervision, Min guard Gait Distance (Feet): 120 Feet Assistive device: None   Gait velocity: decreased     General Gait Details: occasionally reaching out for furniture for support. encouraged patient to use rolling walker for safety as needed, however patient declined. Sp02 93-95% on room air with no significant shortness of breath noted with activity.   Stairs             Wheelchair Mobility    Modified Rankin (Stroke Patients Only)       Balance Overall balance assessment: Needs assistance Sitting-balance support: Feet supported Sitting balance-Leahy Scale: Good     Standing balance support: No upper extremity supported Standing balance-Leahy Scale: Fair                              Cognition Arousal/Alertness: Awake/alert Behavior During Therapy: WFL for tasks assessed/performed Overall Cognitive Status: Within Functional Limits for tasks assessed                                          Exercises      General Comments General comments (skin integrity, edema, etc.): patient does report continued generalized weakness and mild fatigue with activity.      Pertinent Vitals/Pain Pain Assessment Pain Assessment: No/denies pain    Home Living                          Prior  Function            PT Goals (current goals can now be found in the care plan section) Acute Rehab PT Goals Patient Stated Goal: to go home PT Goal Formulation: With patient Time For Goal Achievement: 07/29/22 Potential to Achieve Goals: Fair Progress towards PT goals: Progressing toward goals    Frequency    Min 2X/week      PT Plan Current plan remains appropriate    Co-evaluation              AM-PAC PT "6 Clicks" Mobility   Outcome Measure  Help needed turning from your back to your side while in a flat bed without using bedrails?: None Help  needed moving from lying on your back to sitting on the side of a flat bed without using bedrails?: None Help needed moving to and from a bed to a chair (including a wheelchair)?: A Little Help needed standing up from a chair using your arms (e.g., wheelchair or bedside chair)?: A Little Help needed to walk in hospital room?: A Little Help needed climbing 3-5 steps with a railing? : A Little 6 Click Score: 20    End of Session   Activity Tolerance: Patient tolerated treatment well Patient left: in bed;with call bell/phone within reach;with nursing/sitter in room Nurse Communication: Mobility status PT Visit Diagnosis: Muscle weakness (generalized) (M62.81);Unsteadiness on feet (R26.81)     Time: [id number] PT Time Calculation (min) (ACUTE ONLY): 22 min  Charges:  $Therapeutic Activity: 8-22 mins                    Minna Merritts, PT, MPT    Percell Locus 07/16/2022, 11:02 AM

## 2022-07-16 NOTE — Assessment & Plan Note (Addendum)
Creatinine peaked at 3.17 on 3/26.  Creatinine down to 2.58 on 3/27.  Creatinine down to 1.94 on 3/28.  Upon discharge creatinine 1.92.  Recommend checking a BMP in outpatient appointment.

## 2022-07-16 NOTE — Assessment & Plan Note (Signed)
Perinephric stranding seen on CT scan of the right kidney.  Continue antibiotics to treat E. coli sepsis.

## 2022-07-16 NOTE — Assessment & Plan Note (Signed)
On levothyroxine  ?

## 2022-07-16 NOTE — Assessment & Plan Note (Addendum)
Present on admission.  E. coli growing out of both blood and urine cultures, acute right-sided pyelonephritis.  Patient received Rocephin while here and also prior to discharge and will switch over to Keflex for another 10 days completing 14-day course.  Patient also has acute kidney injury.  Patient had leukocytosis tachycardia and elevated lactic acid on presentation.  Patient has thrombocytopenia.

## 2022-07-16 NOTE — Assessment & Plan Note (Addendum)
Holding blood pressure medications.  Recheck blood pressure as outpatient.

## 2022-07-16 NOTE — Plan of Care (Signed)

## 2022-07-16 NOTE — Assessment & Plan Note (Signed)
Secondary to sepsis. °

## 2022-07-17 ENCOUNTER — Ambulatory Visit: Admission: RE | Admit: 2022-07-17 | Payer: Medicare HMO | Source: Ambulatory Visit | Admitting: Gastroenterology

## 2022-07-17 ENCOUNTER — Encounter: Admission: RE | Payer: Self-pay | Source: Ambulatory Visit

## 2022-07-17 DIAGNOSIS — N10 Acute tubulo-interstitial nephritis: Secondary | ICD-10-CM | POA: Diagnosis not present

## 2022-07-17 DIAGNOSIS — E872 Acidosis, unspecified: Secondary | ICD-10-CM | POA: Diagnosis not present

## 2022-07-17 DIAGNOSIS — N170 Acute kidney failure with tubular necrosis: Secondary | ICD-10-CM | POA: Diagnosis not present

## 2022-07-17 DIAGNOSIS — A419 Sepsis, unspecified organism: Secondary | ICD-10-CM | POA: Diagnosis not present

## 2022-07-17 LAB — BASIC METABOLIC PANEL
Anion gap: 8 (ref 5–15)
BUN: 61 mg/dL — ABNORMAL HIGH (ref 8–23)
CO2: 17 mmol/L — ABNORMAL LOW (ref 22–32)
Calcium: 8.2 mg/dL — ABNORMAL LOW (ref 8.9–10.3)
Chloride: 108 mmol/L (ref 98–111)
Creatinine, Ser: 1.94 mg/dL — ABNORMAL HIGH (ref 0.44–1.00)
GFR, Estimated: 28 mL/min — ABNORMAL LOW (ref >60)
Glucose, Bld: 84 mg/dL (ref 70–99)
Potassium: 4.2 mmol/L (ref 3.5–5.1)
Sodium: 133 mmol/L — ABNORMAL LOW (ref 135–145)

## 2022-07-17 LAB — CULTURE, BLOOD (ROUTINE X 2)
Special Requests: ADEQUATE
Special Requests: ADEQUATE

## 2022-07-17 LAB — CBC
HCT: 33.9 % — ABNORMAL LOW (ref 36.0–46.0)
Hemoglobin: 10.6 g/dL — ABNORMAL LOW (ref 12.0–15.0)
MCH: 27.5 pg (ref 26.0–34.0)
MCHC: 31.3 g/dL (ref 30.0–36.0)
MCV: 87.8 fL (ref 80.0–100.0)
Platelets: 125 10*3/uL — ABNORMAL LOW (ref 150–400)
RBC: 3.86 MIL/uL — ABNORMAL LOW (ref 3.87–5.11)
RDW: 17 % — ABNORMAL HIGH (ref 11.5–15.5)
WBC: 13.3 10*3/uL — ABNORMAL HIGH (ref 4.0–10.5)
nRBC: 0 % (ref 0.0–0.2)

## 2022-07-17 LAB — GLUCOSE, CAPILLARY
Glucose-Capillary: 85 mg/dL (ref 70–99)
Glucose-Capillary: 112 mg/dL — ABNORMAL HIGH (ref 70–99)
Glucose-Capillary: 73 mg/dL (ref 70–99)
Glucose-Capillary: 96 mg/dL (ref 70–99)

## 2022-07-17 SURGERY — COLONOSCOPY
Anesthesia: General

## 2022-07-17 MED ORDER — SODIUM CHLORIDE 0.9 % IV SOLN: INTRAVENOUS | Status: DC

## 2022-07-17 NOTE — Progress Notes (Signed)
Progress Note   Patient: Misty Adams [id number] DOB: [birth year] DOA: 07/14/2022     3 DOS: the patient was seen and examined on 07/17/2022   Brief hospital course: ZAVIERA SIDDIQUI is a 67 y.o. female with medical history significant of COPD with ongoing tobacco abuse, abnormal CT chest consistent with interstitial lung disease with recent pulmonary consultation, depression, diabetes, hypertension, PAD comes to the emergency room accompanied by daughter at bedside with generalized weakness, right flank pain and foul-smelling urine with dysuria. 03/25: Patient was found to be tachycardic, tachypnea with elevated white count of 36,000 and abnormal urinalysis. She received IV Rocephin and Zithromax. Patient's chest x-ray does not show pneumonia. Patient is being admitted for sepsis secondary to UTI.  03/26: UCx (+)Ecoli pending susceptibilities. BCx x2 (+)GNR/Ecoli.  3/27.  E. coli growing out of urine and blood cultures.  Urine sensitivities resulted but blood culture sensitivity still pending.  Creatinine 2.58. 3/28.  Creatinine down to 1.94.  Patient still having pain in her back.  Will continue IV Rocephin today and reassess tomorrow.  Blood cultures and urine culture sensitivities are the same so this is the same organism causing the infection.          Assessment and Plan: Severe sepsis ([place]) Present on admission.  E. coli growing out of both blood and urine cultures, acute right-sided pyelonephritis.  Continue Rocephin.  Patient also has acute kidney injury.  Patient had leukocytosis tachycardia and elevated lactic acid on presentation.  Patient has thrombocytopenia.  Reassess tomorrow.  Acute pyelonephritis Perinephric stranding seen on CT scan of the right kidney.  Continue antibiotics to treat E. coli sepsis.  Acute renal failure with acute tubular necrosis superimposed on stage 3b chronic kidney disease (HCC) Creatinine peaked at 3.17 on 3/26.  Creatinine down to 2.58 on  3/27.  Creatinine down to 1.94 on 3/28.  Lactic acidosis Secondary to sepsis  Thrombocytopenia (HCC) Secondary to sepsis  Anxiety and depression On Effexor  Hypothyroidism, unspecified On levothyroxine  Hyperlipidemia, unspecified On Lipitor  Essential hypertension Holding blood pressure medications  Chronic obstructive pulmonary disease (HCC) Respiratory status stable        Subjective: Patient still not feeling great today.  Has some pain in the right back.  No fever.  Admitted for pyelonephritis and sepsis with E. coli.  Physical Exam: Vitals:   07/16/22 1626 07/16/22 2328 07/17/22 0901 07/17/22 1544  BP: (!) 131/57 128/64 126/67 139/70  Pulse: 88 90 89 85  Resp: 17 20 16 16   Temp: 99.4 F (37.4 C) 99.1 F (37.3 C) 98 F (36.7 C) 97.8 F (36.6 C)  TempSrc:      SpO2: 90% (!) 88% 91% 91%  Weight:      Height:       Physical Exam HENT:     Head: Normocephalic.     Mouth/Throat:     Pharynx: No oropharyngeal exudate.  Eyes:     General: Lids are normal.     Conjunctiva/sclera: Conjunctivae normal.  Cardiovascular:     Rate and Rhythm: Normal rate and regular rhythm.     Heart sounds: Normal heart sounds, S1 normal and S2 normal.  Pulmonary:     Breath sounds: No decreased breath sounds, wheezing, rhonchi or rales.  Abdominal:     Palpations: Abdomen is soft.     Tenderness: There is no abdominal tenderness.  Musculoskeletal:     Right lower leg: No swelling.     Left lower leg: No swelling.  Skin:    General: Skin is warm.     Comments: Bruising on arms  Neurological:     Mental Status: She is alert and oriented to person, place, and time.     Data Reviewed: White blood cell count 13.3, hemoglobin, creatinine down to 1.94, sodium 133  Family Communication: Updated patient's daughter on the phone.  Disposition: Status is: Inpatient Remains inpatient appropriate because: Patient still having pain in her right back area.  Will give another  day of antibiotics and reassess tomorrow.  Planned Discharge Destination: Home    Time spent: 28 minutes  Author: Loletha Grayer, MD 07/17/2022 4:15 PM  For on call review [email or web address].

## 2022-07-17 NOTE — Care Management Important Message (Signed)
Important Message  Patient Details  Name: Misty Adams MRN: [id number] Date of Birth: [birth year]   Medicare Important Message Given:  N/A - LOS <3 / Initial given by admissions     Dannette Barbara 07/17/2022, 11:26 AM

## 2022-07-17 NOTE — Plan of Care (Signed)

## 2022-07-17 NOTE — Progress Notes (Signed)
Physical Therapy Treatment Patient Details Name: Misty Adams MRN: [id number] DOB: 1956-02-16 Today's Date: 07/17/2022   History of Present Illness Patient is a 67 year old female admitted for sepsis secondary to UTI. History of COPD, ongoing tobacco abuse    PT Comments    Patient is agreeable to PT. She continues to report generalized weakness that is not worsening. She was able to ambulate in hallway with occasional use of IV pole. Sp02 91% on room air after standing activity. Patient could likely benefit from rolling walker, however she declined. PT will continue to follow to maximize independence.    Recommendations for follow up therapy are one component of a multi-disciplinary discharge planning process, led by the attending physician.  Recommendations may be updated based on patient status, additional functional criteria and insurance authorization.  Follow Up Recommendations       Assistance Recommended at Discharge PRN  Patient can return home with the following Assist for transportation   Equipment Recommendations  None recommended by PT (patient declined DME)    Recommendations for Other Services       Precautions / Restrictions Precautions Precautions: Fall Restrictions Weight Bearing Restrictions: No     Mobility  Bed Mobility Overal bed mobility: Needs Assistance Bed Mobility: Supine to Sit     Supine to sit: Modified independent (Device/Increase time)     General bed mobility comments: increased time and effort required    Transfers Overall transfer level: Needs assistance Equipment used: Rolling walker (2 wheels) Transfers: Sit to/from Stand Sit to Stand: Supervision           General transfer comment: multiple standing bouts performed with good safety awareness.    Ambulation/Gait Ambulation/Gait assistance: Supervision, Min guard   Assistive device: IV Pole, None Gait Pattern/deviations: Step-through pattern Gait velocity:  decreased     General Gait Details: intermittent use of IV pole per patient preference as she declined using rolling walker. she intermittently reaches out for wall for support when not using the IV pole and could likely benefit from rolling walker if willing to use one. Sp02 91% on room air after standing activity. encouraged routine short distance ambulation at home for conditioning   Stairs             Wheelchair Mobility    Modified Rankin (Stroke Patients Only)       Balance Overall balance assessment: Needs assistance Sitting-balance support: Feet supported Sitting balance-Leahy Scale: Good Sitting balance - Comments: patient able to complete peri-hygiene without loss of balance   Standing balance support: No upper extremity supported Standing balance-Leahy Scale: Fair Standing balance comment: patient reports feeling of posterior lean with standing that is not observed by therapist. again, rolling walker would likely be helpful with standing activity for safety                            Cognition Arousal/Alertness: Awake/alert Behavior During Therapy: WFL for tasks assessed/performed Overall Cognitive Status: Within Functional Limits for tasks assessed                                          Exercises      General Comments        Pertinent Vitals/Pain Pain Assessment Pain Assessment: No/denies pain    Home Living  Prior Function            PT Goals (current goals can now be found in the care plan section) Acute Rehab PT Goals Patient Stated Goal: to go home when feeling better PT Goal Formulation: With patient Time For Goal Achievement: 07/29/22 Potential to Achieve Goals: Fair Progress towards PT goals: Progressing toward goals    Frequency    Min 2X/week      PT Plan Current plan remains appropriate    Co-evaluation              AM-PAC PT "6 Clicks" Mobility    Outcome Measure  Help needed turning from your back to your side while in a flat bed without using bedrails?: None Help needed moving from lying on your back to sitting on the side of a flat bed without using bedrails?: None Help needed moving to and from a bed to a chair (including a wheelchair)?: A Little Help needed standing up from a chair using your arms (e.g., wheelchair or bedside chair)?: A Little Help needed to walk in hospital room?: A Little Help needed climbing 3-5 steps with a railing? : A Little 6 Click Score: 20    End of Session         PT Visit Diagnosis: Muscle weakness (generalized) (M62.81);Unsteadiness on feet (R26.81)     Time: [id number] PT Time Calculation (min) (ACUTE ONLY): 20 min  Charges:  $Therapeutic Activity: 8-22 mins                     Minna Merritts, PT, MPT    Percell Locus 07/17/2022, 9:19 AM

## 2022-07-18 DIAGNOSIS — A419 Sepsis, unspecified organism: Secondary | ICD-10-CM | POA: Diagnosis not present

## 2022-07-18 DIAGNOSIS — E872 Acidosis, unspecified: Secondary | ICD-10-CM | POA: Diagnosis not present

## 2022-07-18 DIAGNOSIS — N10 Acute tubulo-interstitial nephritis: Secondary | ICD-10-CM | POA: Diagnosis not present

## 2022-07-18 DIAGNOSIS — N170 Acute kidney failure with tubular necrosis: Secondary | ICD-10-CM | POA: Diagnosis not present

## 2022-07-18 LAB — BASIC METABOLIC PANEL
Anion gap: 5 (ref 5–15)
BUN: 50 mg/dL — ABNORMAL HIGH (ref 8–23)
CO2: 20 mmol/L — ABNORMAL LOW (ref 22–32)
Calcium: 8.4 mg/dL — ABNORMAL LOW (ref 8.9–10.3)
Chloride: 111 mmol/L (ref 98–111)
Creatinine, Ser: 1.92 mg/dL — ABNORMAL HIGH (ref 0.44–1.00)
GFR, Estimated: 28 mL/min — ABNORMAL LOW (ref >60)
Glucose, Bld: 128 mg/dL — ABNORMAL HIGH (ref 70–99)
Potassium: 4.6 mmol/L (ref 3.5–5.1)
Sodium: 136 mmol/L (ref 135–145)

## 2022-07-18 LAB — GLUCOSE, CAPILLARY: Glucose-Capillary: 84 mg/dL (ref 70–99)

## 2022-07-18 MED ORDER — SODIUM CHLORIDE 0.9 % IV SOLN
2.0000 g | Freq: Once | INTRAVENOUS | Status: AC
  Administered 2022-07-18: 2 g via INTRAVENOUS
  Filled 2022-07-18: qty 2

## 2022-07-18 MED ORDER — CEPHALEXIN 500 MG PO CAPS: 500.0000 mg | ORAL_CAPSULE | Freq: Three times a day (TID) | ORAL | 0 refills | Status: AC

## 2022-07-18 NOTE — TOC Progression Note (Signed)
Transition of Care [facility]) - Progression Note    Patient Details  Name: Misty Adams MRN: [id number] Date of Birth: December 23, 1955  [facility] CM/SW [place], RN Phone Number: 07/18/2022, 10:12 AM  Clinical Narrative:     Rolling walker to be delivered by [employer] to the bedside prior to DC No HH needed  Expected Discharge Plan: Home/Self Care Barriers to Discharge: No Barriers Identified  Expected Discharge Plan and Services   Discharge Planning Services: CM Consult   Living arrangements for the past 2 months: Single Family Home Expected Discharge Date: 07/18/22               DME Arranged: Gilford Rile rolling DME Agency: [employer] Date DME Agency Contacted: 07/18/22 Time DME Agency Contacted: [phone] Representative spoke with at DME Agency: Cedric Fishman Arranged: NA           Social Determinants of Health ([employer]) Interventions SDOH Screenings   Food Insecurity: No Food Insecurity (07/14/2022)  Housing: Low Risk  (07/14/2022)  Transportation Needs: No Transportation Needs (07/14/2022)  Utilities: Not At Risk (07/14/2022)  Tobacco Use: High Risk (07/14/2022)    Readmission Risk Interventions     No data to display

## 2022-07-18 NOTE — Discharge Summary (Signed)
Physician Discharge Summary   Patient: Misty Adams MRN: [id number] DOB: 1955/05/18  Admit date:     07/14/2022  Discharge date: 07/18/22  Discharge Physician: Loletha Grayer   PCP: Rusty Aus, MD   Recommendations at discharge:   Follow-up with PCP 5 days Recommended checking a BMP and follow-up appointment.Recommend Checking a BMP in follow up appointment  Discharge Diagnoses: Active Problems:   Severe sepsis (HCC)   Acute renal failure with acute tubular necrosis superimposed on stage 3b chronic kidney disease (HCC)   Acute pyelonephritis   Lactic acidosis   Thrombocytopenia (HCC)   Chronic obstructive pulmonary disease ([place])   Essential hypertension   Hyperlipidemia, unspecified   Hypothyroidism, unspecified   Anxiety and depression    Hospital Course: Misty Adams is a 67 y.o. female with medical history significant of COPD with ongoing tobacco abuse, abnormal CT chest consistent with interstitial lung disease with recent pulmonary consultation, depression, diabetes, hypertension, PAD comes to the emergency room accompanied by daughter at bedside with generalized weakness, right flank pain and foul-smelling urine with dysuria. 03/25: Patient was found to be tachycardic, tachypnea with elevated white count of 36,000 and abnormal urinalysis. She received IV Rocephin and Zithromax. Patient's chest x-ray does not show pneumonia. Patient is being admitted for sepsis secondary to UTI.  03/26: UCx (+)Ecoli pending susceptibilities. BCx x2 (+)GNR/Ecoli.  3/27.  E. coli growing out of urine and blood cultures.  Urine sensitivities resulted but blood culture sensitivity still pending.  Creatinine 2.58. 3/28.  Creatinine down to 1.94.  Patient still having pain in her back.  Will continue IV Rocephin today and reassess tomorrow.  Blood cultures and urine culture sensitivities are the same so this is the same organism causing the infection. 3/29.  Patient interested in going  home.  Creatinine only improved to 1.92.  Patient given a dose of Rocephin prior to discharge and be switched over to Keflex to complete a total of 14 days.          Assessment and Plan: Severe sepsis ([place]) Present on admission.  E. coli growing out of both blood and urine cultures, acute right-sided pyelonephritis.  Continue Rocephin.  Patient also has acute kidney injury.  Patient had leukocytosis tachycardia and elevated lactic acid on presentation.  Patient has thrombocytopenia.  Reassess tomorrow.  Acute pyelonephritis Perinephric stranding seen on CT scan of the right kidney.  Continue antibiotics to treat E. coli sepsis.  Acute renal failure with acute tubular necrosis superimposed on stage 3b chronic kidney disease (HCC) Creatinine peaked at 3.17 on 3/26.  Creatinine down to 2.58 on 3/27.  Creatinine down to 1.94 on 3/28.  Lactic acidosis Secondary to sepsis  Thrombocytopenia (HCC) Secondary to sepsis  Anxiety and depression On Effexor  Hypothyroidism, unspecified On levothyroxine  Hyperlipidemia, unspecified On Lipitor  Essential hypertension Holding blood pressure medications  Chronic obstructive pulmonary disease ([place]) Respiratory status stable         Consultants: None Procedures performed: None Disposition: Home Diet recommendation:  cardiac diet DISCHARGE MEDICATION: Allergies as of 07/18/2022       Reactions   Hydrocodone-acetaminophen Hives   Vicodin [hydrocodone-acetaminophen] Hives   Trelegy Ellipta [fluticasone-umeclidin-vilant] Rash   Changed to Budeson-Glycopyrrol-Formoterol by provider        Medication List     STOP taking these medications    furosemide 20 MG tablet Commonly known as: LASIX   hydrochlorothiazide 25 MG tablet Commonly known as: HYDRODIURIL   ibuprofen 800 MG tablet Commonly known  as: ADVIL   potassium chloride 10 MEQ tablet Commonly known as: KLOR-CON   potassium chloride SA 20 MEQ tablet Commonly  known as: KLOR-CON M       TAKE these medications    acetaminophen 500 MG tablet Commonly known as: TYLENOL Take 1,000 mg by mouth every 6 (six) hours as needed for moderate pain or headache.   albuterol 108 (90 Base) MCG/ACT inhaler Commonly known as: VENTOLIN HFA Inhale 2 puffs into the lungs every 6 (six) hours as needed for wheezing or shortness of breath.   ALPRAZolam 0.5 MG tablet Commonly known as: XANAX Take 0.5 mg by mouth 2 (two) times daily as needed for anxiety.   ascorbic acid 500 MG tablet Commonly known as: VITAMIN C Take 1 tablet (500 mg total) by mouth daily.   aspirin 81 MG tablet Take 81 mg by mouth at bedtime.   atorvastatin 40 MG tablet Commonly known as: Lipitor Take 1 tablet (40 mg total) by mouth at bedtime.   Breztri Aerosphere 160-9-4.8 MCG/ACT Aero Generic drug: Budeson-Glycopyrrol-Formoterol Inhale 2 puffs into the lungs 2 (two) times daily.   cephALEXin 500 MG capsule Commonly known as: KEFLEX Take 1 capsule (500 mg total) by mouth 3 (three) times daily for 10 days.   famotidine 40 MG tablet Commonly known as: PEPCID Take 40 mg by mouth at bedtime.   gabapentin 100 MG capsule Commonly known as: NEURONTIN Take 200 mg by mouth at bedtime.   guaiFENesin-dextromethorphan 100-10 MG/5ML syrup Commonly known as: ROBITUSSIN DM Take 10 mLs by mouth every 4 (four) hours as needed for cough.   levothyroxine 125 MCG tablet Commonly known as: SYNTHROID Take 125 mcg by mouth daily.   ondansetron 4 MG tablet Commonly known as: ZOFRAN Take 4 mg by mouth every 4 (four) hours as needed for nausea or vomiting.   pantoprazole 40 MG tablet Commonly known as: PROTONIX Take 40 mg by mouth daily.   temazepam 15 MG capsule Commonly known as: RESTORIL Take 15 mg by mouth at bedtime as needed for sleep.   traZODone 100 MG tablet Commonly known as: DESYREL Take 100 mg by mouth daily as needed for sleep.   venlafaxine XR 75 MG 24 hr  capsule Commonly known as: EFFEXOR-XR Take 75 mg by mouth daily.   VITAMIN D PO Take 1 tablet by mouth daily.               Durable Medical Equipment  (From admission, onward)           Start     Ordered   07/18/22 1004  For home use only DME Walker rolling  Once       Question Answer Comment  Walker: With 5 Inch Wheels   Patient needs a walker to treat with the following condition Difficulty walking      07/18/22 1003            Follow-up Information     Rusty Aus, MD Follow up in 5 day(s).   Specialty: Internal Medicine Contact information: [address] [place] [address] [phone]                Discharge Exam: Filed Weights   07/14/22 1409 07/14/22 1959  Weight: 81.6 kg 83.9 kg   Physical Exam HENT:     Head: Normocephalic.     Mouth/Throat:     Pharynx: No oropharyngeal exudate.  Eyes:     General: Lids are normal.  Conjunctiva/sclera: Conjunctivae normal.  Cardiovascular:     Rate and Rhythm: Normal rate and regular rhythm.     Heart sounds: Normal heart sounds, S1 normal and S2 normal.  Pulmonary:     Breath sounds: No decreased breath sounds, wheezing, rhonchi or rales.  Abdominal:     Palpations: Abdomen is soft.     Tenderness: There is no abdominal tenderness.  Musculoskeletal:     Right lower leg: No swelling.     Left lower leg: No swelling.  Skin:    General: Skin is warm.     Comments: Bruising on arms  Neurological:     Mental Status: She is alert.     Comments: Answers questions appropriately      Condition at discharge: stable  The results of significant diagnostics from this hospitalization (including imaging, microbiology, ancillary and laboratory) are listed below for reference.   Imaging Studies: CT RENAL STONE STUDY  Result Date: 07/15/2022 CLINICAL DATA:  Abdominal and flank pain EXAM: CT ABDOMEN AND PELVIS WITHOUT CONTRAST TECHNIQUE:  Multidetector CT imaging of the abdomen and pelvis was performed following the standard protocol without IV contrast. RADIATION DOSE REDUCTION: This exam was performed according to the departmental dose-optimization program which includes automated exposure control, adjustment of the mA and/or kV according to patient size and/or use of iterative reconstruction technique. COMPARISON:  06/12/2022 FINDINGS: Lower chest: Calcified posterior leaflet of the mitral valve. Right coronary artery and descending thoracic aortic atherosclerotic vascular calcification. Mild peripheral confluent bandlike opacity posteriorly in the right lower lobe on image 33 series 6, technically nonspecific although generally favoring atelectasis over infection. Hepatobiliary: Unremarkable Pancreas: Unremarkable Spleen: Unremarkable Adrenals/Urinary Tract: 2.2 by 2.7 cm left adrenal adenoma, internal density -2 Hounsfield units. There is also a 1.9 by 1.4 cm lateral limb left adrenal adenoma, internal density 0 Hounsfield units. Mildly thickened right adrenal gland without discrete mass. Left renal atrophy. Compensatory hypertrophy of the right kidney in addition to interval accentuated right perirenal stranding which could be inflammatory. No hydronephrosis or hydroureter. No urinary tract calculi observed. Today I measure the right kidney at 13.0 by 7.1 by 6.3 cm (volume = 300 cm^3), on 06/12/2022 when measured in a similar fashion I arrive at 11.5 by 5.7 by 5.6 cm (volume = 190 cm^3). Stomach/Bowel: Air-levels in the descending colon suggesting diarrheal process. Sigmoid colon diverticulosis without active diverticulitis. Mildly fatty ileocecal valve. Appendix surgically absent. No dilated bowel. Vascular/Lymphatic: Atherosclerosis is present, including aortoiliac atherosclerotic disease. Reproductive: Uterus absent.  Adnexa unremarkable. Other: No supplemental non-categorized findings. Musculoskeletal: Unremarkable IMPRESSION: 1. Air-levels  in the descending colon suggesting diarrheal process. Sigmoid colon diverticulosis without active diverticulitis. 2. The right kidney has increased in size compared to 06/12/2022, currently 300 cubic cm in volume, previously 190 cubic cm. There is also interval accentuated right perirenal stranding which could be inflammatory. No hydronephrosis or hydroureter. No urinary tract calculi are identified. Possibilities for the enlarged right kidney may include acute inflammation such as in pyelonephritis, or renal vascular abnormality. Correlate with renal function and urine analysis. 3. Chronic left renal atrophy. 4. Left adrenal adenomas. No further imaging workup of these lesions is indicated. 5. Calcified posterior leaflet of the mitral valve. 6. Coronary atherosclerosis. 7. Mild peripheral confluent bandlike opacity posteriorly in the right lower lobe, technically nonspecific although generally favoring atelectasis over infection. Aortic Atherosclerosis (ICD10-I70.0). Electronically Signed   By: Van Clines M.D.   On: 07/15/2022 19:58   DG Chest 2 View  Result Date: 07/14/2022 CLINICAL  DATA:  Chest pain EXAM: CHEST - 2 VIEW COMPARISON:  X-ray 09/19/2020.  CT 06/12/2022 FINDINGS: Hyperinflation. No consolidation, pneumothorax or effusion. No edema. Normal cardiopericardial silhouette. Calcified aorta. Degenerative changes of the spine. Vascular stent along the upper left mediastinum. IMPRESSION: Hyperinflation.  Vascular stent.  No acute cardiopulmonary disease Electronically Signed   By: Jill Side M.D.   On: 07/14/2022 14:50    Microbiology: Results for orders placed or performed during the hospital encounter of 07/14/22  Culture, blood (routine x 2)     Status: Abnormal   Collection Time: 07/14/22  2:50 PM   Specimen: BLOOD  Result Value Ref Range Status   Specimen Description   Final    BLOOD RIGHT ANTECUBITAL Performed at [facility], [address], [place], [place] [address]     Special Requests   Final    BOTTLES DRAWN AEROBIC AND ANAEROBIC Blood Culture adequate volume Performed at [facility], [address], [place], [place] [address]    Culture  Setup Time   Final    GRAM NEGATIVE RODS IN BOTH AEROBIC AND ANAEROBIC BOTTLES CRITICAL VALUE NOTED.  VALUE IS CONSISTENT WITH PREVIOUSLY REPORTED AND CALLED VALUE. Performed at [facility], [address] [place], [place] [address]    Culture (A)  Final    ESCHERICHIA COLI SUSCEPTIBILITIES PERFORMED ON PREVIOUS CULTURE WITHIN THE LAST 5 DAYS. Performed at [facility], [place] [address], [place], [place] [address]    Report Status 07/17/2022 FINAL  Final  Culture, blood (routine x 2)     Status: Abnormal   Collection Time: 07/14/22  2:50 PM   Specimen: BLOOD  Result Value Ref Range Status   Specimen Description   Final    BLOOD BLOOD RIGHT FOREARM Performed at [facility], [address] [place], [place] [address]    Special Requests   Final    BOTTLES DRAWN AEROBIC AND ANAEROBIC Blood Culture adequate volume Performed at [facility], [address], [place], [place] [address]    Culture  Setup Time   Final    GRAM NEGATIVE RODS IN BOTH AEROBIC AND ANAEROBIC BOTTLES CRITICAL RESULT CALLED TO, READ BACK BY AND VERIFIED WITH: JASON ROBBINS@0400  07/15/22 RH Performed at [facility], [place] [address], [place], [place] [address]    Culture ESCHERICHIA COLI (A)  Final   Report Status 07/17/2022 FINAL  Final   Organism ID, Bacteria ESCHERICHIA COLI  Final      Susceptibility   Escherichia coli - MIC*    AMPICILLIN >=32 RESISTANT Resistant     CEFEPIME <=0.12 SENSITIVE Sensitive     CEFTAZIDIME <=1 SENSITIVE Sensitive     CEFTRIAXONE <=0.25 SENSITIVE Sensitive     CIPROFLOXACIN <=0.25 SENSITIVE Sensitive     GENTAMICIN <=1 SENSITIVE Sensitive     IMIPENEM <=0.25 SENSITIVE Sensitive     TRIMETH/SULFA <=20 SENSITIVE Sensitive     AMPICILLIN/SULBACTAM  16 INTERMEDIATE Intermediate     PIP/TAZO <=4 SENSITIVE Sensitive     * ESCHERICHIA COLI  Blood Culture ID Panel (Reflexed)     Status: Abnormal   Collection Time: 07/14/22  2:50 PM  Result Value Ref Range Status   Enterococcus faecalis NOT DETECTED NOT DETECTED Final   Enterococcus Faecium NOT DETECTED NOT DETECTED Final   Listeria monocytogenes NOT DETECTED NOT DETECTED Final   Staphylococcus species NOT DETECTED NOT DETECTED Final   Staphylococcus aureus (BCID) NOT DETECTED NOT DETECTED Final   Staphylococcus epidermidis NOT  DETECTED NOT DETECTED Final   Staphylococcus lugdunensis NOT DETECTED NOT DETECTED Final   Streptococcus species NOT DETECTED NOT DETECTED Final   Streptococcus agalactiae NOT DETECTED NOT DETECTED Final   Streptococcus pneumoniae NOT DETECTED NOT DETECTED Final   Streptococcus pyogenes NOT DETECTED NOT DETECTED Final   A.calcoaceticus-baumannii NOT DETECTED NOT DETECTED Final   Bacteroides fragilis NOT DETECTED NOT DETECTED Final   Enterobacterales DETECTED (A) NOT DETECTED Final    Comment: Enterobacterales represent a large order of gram negative bacteria, not a single organism. CRITICAL RESULT CALLED TO, READ BACK BY AND VERIFIED WITH: JASON ROBBINS@03 /26/24 RH    Enterobacter cloacae complex NOT DETECTED NOT DETECTED Final   Escherichia coli DETECTED (A) NOT DETECTED Final    Comment: CRITICAL RESULT CALLED TO, READ BACK BY AND VERIFIED WITH: JASON ROBBINS@0400  07/15/22 RH    Klebsiella aerogenes NOT DETECTED NOT DETECTED Final   Klebsiella oxytoca NOT DETECTED NOT DETECTED Final   Klebsiella pneumoniae NOT DETECTED NOT DETECTED Final   Proteus species NOT DETECTED NOT DETECTED Final   Salmonella species NOT DETECTED NOT DETECTED Final   Serratia marcescens NOT DETECTED NOT DETECTED Final   Haemophilus influenzae NOT DETECTED NOT DETECTED Final   Neisseria meningitidis NOT DETECTED NOT DETECTED Final   Pseudomonas aeruginosa NOT DETECTED NOT DETECTED  Final   Stenotrophomonas maltophilia NOT DETECTED NOT DETECTED Final   Candida albicans NOT DETECTED NOT DETECTED Final   Candida auris NOT DETECTED NOT DETECTED Final   Candida glabrata NOT DETECTED NOT DETECTED Final   Candida krusei NOT DETECTED NOT DETECTED Final   Candida parapsilosis NOT DETECTED NOT DETECTED Final   Candida tropicalis NOT DETECTED NOT DETECTED Final   Cryptococcus neoformans/gattii NOT DETECTED NOT DETECTED Final   CTX-M ESBL NOT DETECTED NOT DETECTED Final   Carbapenem resistance IMP NOT DETECTED NOT DETECTED Final   Carbapenem resistance KPC NOT DETECTED NOT DETECTED Final   Carbapenem resistance NDM NOT DETECTED NOT DETECTED Final   Carbapenem resist OXA 48 LIKE NOT DETECTED NOT DETECTED Final   Carbapenem resistance VIM NOT DETECTED NOT DETECTED Final    Comment: Performed at [facility], [address] [place], [place] [address]  Urine Culture     Status: Abnormal   Collection Time: 07/14/22  3:24 PM   Specimen: Urine, Random  Result Value Ref Range Status   Specimen Description   Final    URINE, RANDOM Performed at [facility], [address] [place], [place] [address]    Special Requests   Final    NONE Reflexed from [id number] Performed at [facility], [address] [place], [place] [address]    Culture >=100,000 COLONIES/mL ESCHERICHIA COLI (A)  Final   Report Status 07/16/2022 FINAL  Final   Organism ID, Bacteria ESCHERICHIA COLI (A)  Final      Susceptibility   Escherichia coli - MIC*    AMPICILLIN >=32 RESISTANT Resistant     CEFAZOLIN <=4 SENSITIVE Sensitive     CEFEPIME <=0.12 SENSITIVE Sensitive     CEFTRIAXONE <=0.25 SENSITIVE Sensitive     CIPROFLOXACIN <=0.25 SENSITIVE Sensitive     GENTAMICIN <=1 SENSITIVE Sensitive     IMIPENEM <=0.25 SENSITIVE Sensitive     NITROFURANTOIN <=16 SENSITIVE Sensitive     TRIMETH/SULFA <=20 SENSITIVE Sensitive     AMPICILLIN/SULBACTAM 16 INTERMEDIATE Intermediate      PIP/TAZO <=4 SENSITIVE Sensitive     * >=100,000 COLONIES/mL ESCHERICHIA COLI  SARS Coronavirus 2 by RT PCR (hospital order,  performed in [facility] hospital lab) *cepheid single result test* Anterior Nasal Swab     Status: None   Collection Time: 07/14/22  3:25 PM   Specimen: Anterior Nasal Swab  Result Value Ref Range Status   SARS Coronavirus 2 by RT PCR NEGATIVE NEGATIVE Final    Comment: (NOTE) SARS-CoV-2 target nucleic acids are NOT DETECTED.  The SARS-CoV-2 RNA is generally detectable in upper and lower respiratory specimens during the acute phase of infection. The lowest concentration of SARS-CoV-2 viral copies this assay can detect is 250 copies / mL. A negative result does not preclude SARS-CoV-2 infection and should not be used as the sole basis for treatment or other patient management decisions.  A negative result may occur with improper specimen collection / handling, submission of specimen other than nasopharyngeal swab, presence of viral mutation(s) within the areas targeted by this assay, and inadequate number of viral copies (<250 copies / mL). A negative result must be combined with clinical observations, patient history, and epidemiological information.  Fact Sheet for Patients:   https://www.patel.info/  Fact Sheet for Healthcare Providers: https://hall.com/  This test is not yet approved or  cleared by the [place] FDA and has been authorized for detection and/or diagnosis of SARS-CoV-2 by FDA under an Emergency Use Authorization (EUA).  This EUA will remain in effect (meaning this test can be used) for the duration of the COVID-19 declaration under Section 564(b)(1) of the Act, 21 U.S.C. section 360bbb-3(b)(1), unless the authorization is terminated or revoked sooner.  Performed at [facility], [address] [place], [place] [address]     Labs: CBC: Recent Labs  Lab 07/14/22 1411  07/15/22 [phone] 07/16/22 0633 07/17/22 0921  WBC 36.6* 33.8* 17.4* 13.3*  HGB 14.7 11.9* 11.6* 10.6*  HCT 45.6 36.8 36.5 33.9*  MCV 85.1 85.0 87.7 87.8  PLT 223 145* 116* 0000000*   Basic Metabolic Panel: Recent Labs  Lab 07/14/22 1411 07/15/22 0627 07/16/22 0633 07/17/22 0921 07/18/22 0513  NA 132* 132* 132* 133* 136  K 2.8* 4.0 4.0 4.2 4.6  CL 91* 101 103 108 111  CO2 24 22 20* 17* 20*  GLUCOSE 188* 115* 99 84 128*  BUN 72* 77* 80* 61* 50*  CREATININE 2.94* 3.17* 2.58* 1.94* 1.92*  CALCIUM 9.0 7.9* 8.2* 8.2* 8.4*    CBG: Recent Labs  Lab 07/17/22 0741 07/17/22 1146 07/17/22 1701 07/17/22 2045 07/18/22 0751  GLUCAP 85 112* 73 96 84    Discharge time spent: greater than 30 minutes.  Signed: Loletha Grayer, MD [facility] 07/18/2022

## 2022-07-18 NOTE — Progress Notes (Signed)
Occupational Therapy Treatment Patient Details Name: Misty Adams MRN: [id number] DOB: 12-29-1955 Today's Date: 07/18/2022   History of present illness Patient is a 67 year old female admitted for sepsis secondary to UTI. History of COPD, ongoing tobacco abuse   OT comments  [name] has made good progress. During today's session, trialed ambulation w/ and w/o RW, w/ pt in agreement that she feels much steadier with RW and that she will use it at home, at least initially, until she feels she has regained her full strength. Pt continues to report generalized weakness and fatigue. Provided educ and encouragement re: smoking cessation/harm reduction. Anticipate that pt will be able to function safely at home w/o an additional OT services currently.   Recommendations for follow up therapy are one component of a multi-disciplinary discharge planning process, led by the attending physician.  Recommendations may be updated based on patient status, additional functional criteria and insurance authorization.    Assistance Recommended at Discharge PRN  Patient can return home with the following  Assistance with cooking/housework;Assist for transportation   Equipment Recommendations  Other (comment) (RW)    Recommendations for Other Services      Precautions / Restrictions Precautions Precautions: Fall Restrictions Weight Bearing Restrictions: No       Mobility Bed Mobility               General bed mobility comments: NT    Transfers Overall transfer level: Needs assistance Equipment used: Rolling walker (2 wheels) Transfers: Sit to/from Stand Sit to Stand: Supervision           General transfer comment: multiple standing bouts performed with good safety awareness.     Balance Overall balance assessment: Needs assistance Sitting-balance support: Feet supported Sitting balance-Leahy Scale: Good     Standing balance support: Bilateral upper extremity supported,  No upper extremity supported Standing balance-Leahy Scale: Good Standing balance comment: ambulation with and without RW, w/ pt much steadier with RW.                           ADL either performed or assessed with clinical judgement   ADL Overall ADL's : Needs assistance/impaired Eating/Feeding: Modified independent                       Toilet Transfer: Supervision/safety;Regular Toilet;Rolling walker (2 wheels)   Toileting- Clothing Manipulation and Hygiene: Modified independent;Sitting/lateral lean              Extremity/Trunk Assessment Upper Extremity Assessment Upper Extremity Assessment: Overall WFL for tasks assessed   Lower Extremity Assessment Lower Extremity Assessment: Overall WFL for tasks assessed   Cervical / Trunk Assessment Cervical / Trunk Assessment: Normal    Vision       Perception     Praxis      Cognition Arousal/Alertness: Awake/alert Behavior During Therapy: WFL for tasks assessed/performed Overall Cognitive Status: Within Functional Limits for tasks assessed                                          Exercises Other Exercises Other Exercises: Educ re: DME use, falls prevention, ECS, smoking cessation/harm reduction, pet care mgmt    Shoulder Instructions       General Comments      Pertinent Vitals/ Pain       Pain Assessment  Pain Assessment: No/denies pain  Home Living                                          Prior Functioning/Environment              Frequency  Min 2X/week        Progress Toward Goals  OT Goals(current goals can now be found in the care plan section)  Progress towards OT goals: Progressing toward goals  Acute Rehab OT Goals OT Goal Formulation: With patient Time For Goal Achievement: 07/29/22 Potential to Achieve Goals: Good  Plan Discharge plan remains appropriate;Frequency remains appropriate    Co-evaluation                  AM-PAC OT "6 Clicks" Daily Activity     Outcome Measure   Help from another person eating meals?: None Help from another person taking care of personal grooming?: A Little Help from another person toileting, which includes using toliet, bedpan, or urinal?: A Little Help from another person bathing (including washing, rinsing, drying)?: A Little Help from another person to put on and taking off regular upper body clothing?: None Help from another person to put on and taking off regular lower body clothing?: None 6 Click Score: 21    End of Session Equipment Utilized During Treatment: Rolling walker (2 wheels)  OT Visit Diagnosis: Unsteadiness on feet (R26.81);Muscle weakness (generalized) (M62.81)   Activity Tolerance Patient tolerated treatment well   Patient Left in chair;with family/visitor present;with call bell/phone within reach   Nurse Communication          Time: [id number] OT Time Calculation (min): 8 min  Charges: OT General Charges $OT Visit: 1 Visit OT Treatments $Self Care/Home Management : 23-37 mins Josiah Lobo, PhD, MS, OTR/L 07/18/22, 12:11 PM

## 2022-07-18 NOTE — Progress Notes (Signed)
PT Cancellation Note  Patient Details Name: Misty Adams MRN: [id number] DOB: Jul 09, 1955   Cancelled Treatment:    Reason Eval/Treat Not Completed: Other (comment) (Patient declined as she is anticipated to discharge home today. Patient and daughter requesting a rolling walker which was ordered this morning.)  Minna Merritts, PT, MPT  Percell Locus 07/18/2022, 10:08 AM

## 2022-07-18 NOTE — Progress Notes (Signed)
Reviewed discharge instructions with pt. Pt verbalized  understanding. PT discharged with all personal belongings and a walker. Staff wheeled pt out. Pt transported to home via family private vehicle.

## 2022-07-18 NOTE — Plan of Care (Signed)
  Problem: Education: Goal: Ability to describe self-care measures that may prevent or decrease complications (Diabetes Survival Skills Education) will improve Outcome: Adequate for Discharge Goal: Individualized Educational Video(s) Outcome: Adequate for Discharge   Problem: Coping: Goal: Ability to adjust to condition or change in health will improve Outcome: Adequate for Discharge   Problem: Fluid Volume: Goal: Ability to maintain a balanced intake and output will improve Outcome: Adequate for Discharge   Problem: Health Behavior/Discharge Planning: Goal: Ability to identify and utilize available resources and services will improve Outcome: Adequate for Discharge Goal: Ability to manage health-related needs will improve Outcome: Adequate for Discharge   Problem: Metabolic: Goal: Ability to maintain appropriate glucose levels will improve Outcome: Adequate for Discharge   Problem: Nutritional: Goal: Maintenance of adequate nutrition will improve Outcome: Adequate for Discharge Goal: Progress toward achieving an optimal weight will improve Outcome: Adequate for Discharge   Problem: Skin Integrity: Goal: Risk for impaired skin integrity will decrease Outcome: Adequate for Discharge   Problem: Tissue Perfusion: Goal: Adequacy of tissue perfusion will improve Outcome: Adequate for Discharge   Problem: Education: Goal: Knowledge of General Education information will improve Description: Including pain rating scale, medication(s)/side effects and non-pharmacologic comfort measures Outcome: Adequate for Discharge   Problem: Health Behavior/Discharge Planning: Goal: Ability to manage health-related needs will improve Outcome: Adequate for Discharge   Problem: Clinical Measurements: Goal: Ability to maintain clinical measurements within normal limits will improve Outcome: Adequate for Discharge Goal: Will remain free from infection Outcome: Adequate for Discharge Goal:  Diagnostic test results will improve Outcome: Adequate for Discharge Goal: Respiratory complications will improve Outcome: Adequate for Discharge Goal: Cardiovascular complication will be avoided Outcome: Adequate for Discharge   Problem: Activity: Goal: Risk for activity intolerance will decrease Outcome: Adequate for Discharge   Problem: Nutrition: Goal: Adequate nutrition will be maintained Outcome: Adequate for Discharge   Problem: Coping: Goal: Level of anxiety will decrease Outcome: Adequate for Discharge   Problem: Elimination: Goal: Will not experience complications related to bowel motility Outcome: Adequate for Discharge Goal: Will not experience complications related to urinary retention Outcome: Adequate for Discharge   Problem: Pain Managment: Goal: General experience of comfort will improve Outcome: Adequate for Discharge   Problem: Safety: Goal: Ability to remain free from injury will improve Outcome: Adequate for Discharge   Problem: Skin Integrity: Goal: Risk for impaired skin integrity will decrease Outcome: Adequate for Discharge   Problem: Acute Rehab PT Goals(only PT should resolve) Goal: Pt Will Ambulate Outcome: Adequate for Discharge Goal: Pt Will Go Up/Down Stairs Outcome: Adequate for Discharge Goal: Pt/caregiver will Perform Home Exercise Program Outcome: Adequate for Discharge   

## 2022-07-23 DIAGNOSIS — N179 Acute kidney failure, unspecified: Secondary | ICD-10-CM | POA: Diagnosis not present

## 2022-07-23 DIAGNOSIS — Z8619 Personal history of other infectious and parasitic diseases: Secondary | ICD-10-CM | POA: Diagnosis not present

## 2022-07-23 DIAGNOSIS — N170 Acute kidney failure with tubular necrosis: Secondary | ICD-10-CM | POA: Diagnosis not present

## 2022-07-23 DIAGNOSIS — F32A Depression, unspecified: Secondary | ICD-10-CM | POA: Diagnosis not present

## 2022-07-23 DIAGNOSIS — A4151 Sepsis due to Escherichia coli [E. coli]: Secondary | ICD-10-CM | POA: Diagnosis not present

## 2022-08-06 DIAGNOSIS — E440 Moderate protein-calorie malnutrition: Secondary | ICD-10-CM | POA: Diagnosis not present

## 2022-08-06 DIAGNOSIS — F32A Depression, unspecified: Secondary | ICD-10-CM | POA: Diagnosis not present

## 2022-08-06 DIAGNOSIS — N183 Chronic kidney disease, stage 3 unspecified: Secondary | ICD-10-CM | POA: Diagnosis not present

## 2022-08-06 DIAGNOSIS — A4151 Sepsis due to Escherichia coli [E. coli]: Secondary | ICD-10-CM | POA: Diagnosis not present

## 2022-08-06 DIAGNOSIS — N3090 Cystitis, unspecified without hematuria: Secondary | ICD-10-CM | POA: Diagnosis not present

## 2022-08-06 DIAGNOSIS — N261 Atrophy of kidney (terminal): Secondary | ICD-10-CM | POA: Diagnosis not present

## 2022-08-19 DIAGNOSIS — F32A Depression, unspecified: Secondary | ICD-10-CM | POA: Diagnosis not present

## 2022-08-19 DIAGNOSIS — N3090 Cystitis, unspecified without hematuria: Secondary | ICD-10-CM | POA: Diagnosis not present

## 2022-08-19 DIAGNOSIS — E46 Unspecified protein-calorie malnutrition: Secondary | ICD-10-CM | POA: Diagnosis not present

## 2022-08-19 DIAGNOSIS — N183 Chronic kidney disease, stage 3 unspecified: Secondary | ICD-10-CM | POA: Diagnosis not present

## 2022-08-19 DIAGNOSIS — Z87440 Personal history of urinary (tract) infections: Secondary | ICD-10-CM | POA: Diagnosis not present

## 2022-09-02 ENCOUNTER — Other Ambulatory Visit: Payer: Medicare HMO

## 2022-09-16 ENCOUNTER — Other Ambulatory Visit: Payer: Self-pay | Admitting: Specialist

## 2022-09-16 DIAGNOSIS — R918 Other nonspecific abnormal finding of lung field: Secondary | ICD-10-CM

## 2022-09-16 DIAGNOSIS — R0602 Shortness of breath: Secondary | ICD-10-CM

## 2022-09-17 ENCOUNTER — Ambulatory Visit: Payer: Medicare HMO | Admitting: Dermatology

## 2022-09-23 DIAGNOSIS — R300 Dysuria: Secondary | ICD-10-CM | POA: Diagnosis not present

## 2022-09-30 ENCOUNTER — Ambulatory Visit
Admission: RE | Admit: 2022-09-30 | Discharge: 2022-09-30 | Disposition: A | Discharge status: Home / Self Care | Payer: Medicare HMO | Source: Ambulatory Visit | Attending: Specialist | Admitting: Specialist

## 2022-09-30 DIAGNOSIS — R918 Other nonspecific abnormal finding of lung field: Secondary | ICD-10-CM | POA: Diagnosis not present

## 2022-09-30 DIAGNOSIS — R0602 Shortness of breath: Secondary | ICD-10-CM

## 2022-09-30 DIAGNOSIS — J432 Centrilobular emphysema: Secondary | ICD-10-CM | POA: Diagnosis not present

## 2022-10-01 ENCOUNTER — Ambulatory Visit
Admission: RE | Admit: 2022-10-01 | Discharge: 2022-10-01 | Disposition: A | Discharge status: Home / Self Care | Payer: Medicare HMO | Source: Ambulatory Visit | Attending: Internal Medicine | Admitting: Internal Medicine

## 2022-10-01 ENCOUNTER — Other Ambulatory Visit: Payer: Self-pay | Admitting: Internal Medicine

## 2022-10-01 DIAGNOSIS — N6310 Unspecified lump in the right breast, unspecified quadrant: Secondary | ICD-10-CM | POA: Diagnosis not present

## 2022-10-01 DIAGNOSIS — R92321 Mammographic fibroglandular density, right breast: Secondary | ICD-10-CM | POA: Diagnosis not present

## 2022-10-01 DIAGNOSIS — N641 Fat necrosis of breast: Secondary | ICD-10-CM

## 2022-10-01 DIAGNOSIS — N6312 Unspecified lump in the right breast, upper inner quadrant: Secondary | ICD-10-CM | POA: Diagnosis not present

## 2022-10-16 ENCOUNTER — Ambulatory Visit
Admission: RE | Admit: 2022-10-16 | Discharge: 2022-10-16 | Disposition: A | Discharge status: Home / Self Care | Payer: Medicare HMO | Source: Ambulatory Visit | Attending: Family Medicine | Admitting: Family Medicine

## 2022-10-16 ENCOUNTER — Other Ambulatory Visit: Payer: Self-pay | Admitting: Family Medicine

## 2022-10-16 ENCOUNTER — Other Ambulatory Visit: Payer: Self-pay

## 2022-10-16 DIAGNOSIS — R1011 Right upper quadrant pain: Secondary | ICD-10-CM | POA: Diagnosis not present

## 2022-10-16 DIAGNOSIS — R109 Unspecified abdominal pain: Secondary | ICD-10-CM

## 2022-10-16 DIAGNOSIS — E1151 Type 2 diabetes mellitus with diabetic peripheral angiopathy without gangrene: Secondary | ICD-10-CM | POA: Diagnosis not present

## 2022-10-16 DIAGNOSIS — K5790 Diverticulosis of intestine, part unspecified, without perforation or abscess without bleeding: Secondary | ICD-10-CM | POA: Diagnosis not present

## 2022-10-16 DIAGNOSIS — K449 Diaphragmatic hernia without obstruction or gangrene: Secondary | ICD-10-CM | POA: Diagnosis not present

## 2022-10-16 LAB — POCT I-STAT CREATININE
Creatinine, Ser: 1.9 mg/dL — ABNORMAL HIGH (ref 0.44–1.00)
Creatinine, Ser: 1.9 mg/dL — ABNORMAL HIGH (ref 0.44–1.00)

## 2022-10-16 MED ORDER — IOHEXOL 300 MG/ML  SOLN: 100.0000 mL | Freq: Once | INTRAMUSCULAR | Status: DC | PRN

## 2022-10-28 DIAGNOSIS — D509 Iron deficiency anemia, unspecified: Secondary | ICD-10-CM | POA: Diagnosis not present

## 2022-10-28 DIAGNOSIS — E1151 Type 2 diabetes mellitus with diabetic peripheral angiopathy without gangrene: Secondary | ICD-10-CM | POA: Diagnosis not present

## 2022-10-28 DIAGNOSIS — F32A Depression, unspecified: Secondary | ICD-10-CM | POA: Diagnosis not present

## 2022-11-11 DIAGNOSIS — Z86010 Personal history of colonic polyps: Secondary | ICD-10-CM | POA: Diagnosis not present

## 2022-11-11 DIAGNOSIS — D509 Iron deficiency anemia, unspecified: Secondary | ICD-10-CM | POA: Diagnosis not present

## 2022-11-17 DIAGNOSIS — J439 Emphysema, unspecified: Secondary | ICD-10-CM | POA: Diagnosis not present

## 2022-11-17 DIAGNOSIS — R942 Abnormal results of pulmonary function studies: Secondary | ICD-10-CM | POA: Diagnosis not present

## 2022-11-17 DIAGNOSIS — G4719 Other hypersomnia: Secondary | ICD-10-CM | POA: Diagnosis not present

## 2022-11-17 DIAGNOSIS — R0602 Shortness of breath: Secondary | ICD-10-CM | POA: Diagnosis not present

## 2022-11-17 DIAGNOSIS — R0683 Snoring: Secondary | ICD-10-CM | POA: Diagnosis not present

## 2022-11-28 DIAGNOSIS — E538 Deficiency of other specified B group vitamins: Secondary | ICD-10-CM | POA: Diagnosis not present

## 2022-11-28 DIAGNOSIS — D509 Iron deficiency anemia, unspecified: Secondary | ICD-10-CM | POA: Diagnosis not present

## 2022-11-28 DIAGNOSIS — D500 Iron deficiency anemia secondary to blood loss (chronic): Secondary | ICD-10-CM | POA: Diagnosis not present

## 2022-12-05 ENCOUNTER — Ambulatory Visit: Payer: Medicare HMO

## 2022-12-05 ENCOUNTER — Other Ambulatory Visit: Payer: Self-pay | Admitting: Internal Medicine

## 2022-12-05 DIAGNOSIS — R0609 Other forms of dyspnea: Secondary | ICD-10-CM | POA: Diagnosis not present

## 2022-12-05 DIAGNOSIS — F32A Depression, unspecified: Secondary | ICD-10-CM | POA: Diagnosis not present

## 2022-12-05 DIAGNOSIS — R634 Abnormal weight loss: Secondary | ICD-10-CM | POA: Diagnosis not present

## 2022-12-05 DIAGNOSIS — D509 Iron deficiency anemia, unspecified: Secondary | ICD-10-CM | POA: Diagnosis not present

## 2022-12-05 DIAGNOSIS — D500 Iron deficiency anemia secondary to blood loss (chronic): Secondary | ICD-10-CM

## 2022-12-05 DIAGNOSIS — J4489 Other specified chronic obstructive pulmonary disease: Secondary | ICD-10-CM

## 2022-12-05 DIAGNOSIS — G4710 Hypersomnia, unspecified: Secondary | ICD-10-CM | POA: Diagnosis not present

## 2022-12-08 ENCOUNTER — Ambulatory Visit
Admission: RE | Admit: 2022-12-08 | Discharge: 2022-12-08 | Disposition: A | Discharge status: Home / Self Care | Payer: Medicare HMO | Source: Ambulatory Visit | Attending: Internal Medicine | Admitting: Internal Medicine

## 2022-12-08 ENCOUNTER — Other Ambulatory Visit: Payer: Self-pay | Admitting: *Deleted

## 2022-12-08 ENCOUNTER — Inpatient Hospital Stay: Payer: Medicare HMO | Attending: Internal Medicine | Admitting: Internal Medicine

## 2022-12-08 ENCOUNTER — Inpatient Hospital Stay: Payer: Medicare HMO

## 2022-12-08 ENCOUNTER — Encounter: Payer: Self-pay | Admitting: Internal Medicine

## 2022-12-08 VITALS — BP 115/53 | HR 67 | Temp 97.4°F | Ht 64.0 in | Wt 170.4 lb

## 2022-12-08 DIAGNOSIS — I509 Heart failure, unspecified: Secondary | ICD-10-CM | POA: Diagnosis not present

## 2022-12-08 DIAGNOSIS — F419 Anxiety disorder, unspecified: Secondary | ICD-10-CM | POA: Diagnosis not present

## 2022-12-08 DIAGNOSIS — E1122 Type 2 diabetes mellitus with diabetic chronic kidney disease: Secondary | ICD-10-CM | POA: Diagnosis not present

## 2022-12-08 DIAGNOSIS — D649 Anemia, unspecified: Secondary | ICD-10-CM | POA: Insufficient documentation

## 2022-12-08 DIAGNOSIS — Z7982 Long term (current) use of aspirin: Secondary | ICD-10-CM | POA: Diagnosis not present

## 2022-12-08 DIAGNOSIS — D509 Iron deficiency anemia, unspecified: Secondary | ICD-10-CM | POA: Diagnosis not present

## 2022-12-08 DIAGNOSIS — F17210 Nicotine dependence, cigarettes, uncomplicated: Secondary | ICD-10-CM | POA: Diagnosis not present

## 2022-12-08 DIAGNOSIS — R634 Abnormal weight loss: Secondary | ICD-10-CM | POA: Diagnosis not present

## 2022-12-08 DIAGNOSIS — D3502 Benign neoplasm of left adrenal gland: Secondary | ICD-10-CM | POA: Diagnosis not present

## 2022-12-08 DIAGNOSIS — R0609 Other forms of dyspnea: Secondary | ICD-10-CM | POA: Diagnosis not present

## 2022-12-08 DIAGNOSIS — N183 Chronic kidney disease, stage 3 unspecified: Secondary | ICD-10-CM | POA: Insufficient documentation

## 2022-12-08 DIAGNOSIS — E039 Hypothyroidism, unspecified: Secondary | ICD-10-CM | POA: Diagnosis not present

## 2022-12-08 DIAGNOSIS — Z79899 Other long term (current) drug therapy: Secondary | ICD-10-CM | POA: Insufficient documentation

## 2022-12-08 DIAGNOSIS — J4489 Other specified chronic obstructive pulmonary disease: Secondary | ICD-10-CM | POA: Diagnosis not present

## 2022-12-08 DIAGNOSIS — E785 Hyperlipidemia, unspecified: Secondary | ICD-10-CM | POA: Diagnosis not present

## 2022-12-08 DIAGNOSIS — K909 Intestinal malabsorption, unspecified: Secondary | ICD-10-CM | POA: Diagnosis not present

## 2022-12-08 DIAGNOSIS — I130 Hypertensive heart and chronic kidney disease with heart failure and stage 1 through stage 4 chronic kidney disease, or unspecified chronic kidney disease: Secondary | ICD-10-CM | POA: Diagnosis not present

## 2022-12-08 DIAGNOSIS — D500 Iron deficiency anemia secondary to blood loss (chronic): Secondary | ICD-10-CM | POA: Diagnosis not present

## 2022-12-08 DIAGNOSIS — J439 Emphysema, unspecified: Secondary | ICD-10-CM | POA: Diagnosis not present

## 2022-12-08 DIAGNOSIS — R918 Other nonspecific abnormal finding of lung field: Secondary | ICD-10-CM | POA: Diagnosis not present

## 2022-12-08 LAB — CBC WITH DIFFERENTIAL/PLATELET
Abs Immature Granulocytes: 0.04 10*3/uL (ref 0.00–0.07)
Basophils Absolute: 0 10*3/uL (ref 0.0–0.1)
Basophils Relative: 0 %
Eosinophils Absolute: 0.2 10*3/uL (ref 0.0–0.5)
Eosinophils Relative: 2 %
HCT: 31.4 % — ABNORMAL LOW (ref 36.0–46.0)
Hemoglobin: 8.7 g/dL — ABNORMAL LOW (ref 12.0–15.0)
Immature Granulocytes: 0 %
Lymphocytes Relative: 15 %
Lymphs Abs: 1.6 10*3/uL (ref 0.7–4.0)
MCH: 20.4 pg — ABNORMAL LOW (ref 26.0–34.0)
MCHC: 27.7 g/dL — ABNORMAL LOW (ref 30.0–36.0)
MCV: 73.5 fL — ABNORMAL LOW (ref 80.0–100.0)
Monocytes Absolute: 0.6 10*3/uL (ref 0.1–1.0)
Monocytes Relative: 5 %
Neutro Abs: 8.6 10*3/uL — ABNORMAL HIGH (ref 1.7–7.7)
Neutrophils Relative %: 78 %
Platelets: 373 10*3/uL (ref 150–400)
RBC: 4.27 MIL/uL (ref 3.87–5.11)
RDW: 21.2 % — ABNORMAL HIGH (ref 11.5–15.5)
WBC: 11 10*3/uL — ABNORMAL HIGH (ref 4.0–10.5)
nRBC: 0 % (ref 0.0–0.2)

## 2022-12-08 LAB — COMPREHENSIVE METABOLIC PANEL
ALT: 11 U/L (ref 0–44)
AST: 14 U/L — ABNORMAL LOW (ref 15–41)
Albumin: 3.7 g/dL (ref 3.5–5.0)
Alkaline Phosphatase: 67 U/L (ref 38–126)
Anion gap: 11 (ref 5–15)
BUN: 25 mg/dL — ABNORMAL HIGH (ref 8–23)
CO2: 31 mmol/L (ref 22–32)
Calcium: 9.3 mg/dL (ref 8.9–10.3)
Chloride: 92 mmol/L — ABNORMAL LOW (ref 98–111)
Creatinine, Ser: 1.38 mg/dL — ABNORMAL HIGH (ref 0.44–1.00)
GFR, Estimated: 41 mL/min — ABNORMAL LOW (ref >60)
Glucose, Bld: 118 mg/dL — ABNORMAL HIGH (ref 70–99)
Potassium: 3.1 mmol/L — ABNORMAL LOW (ref 3.5–5.1)
Sodium: 134 mmol/L — ABNORMAL LOW (ref 135–145)
Total Bilirubin: 0.4 mg/dL (ref 0.3–1.2)
Total Protein: 7.5 g/dL (ref 6.5–8.1)

## 2022-12-08 LAB — FERRITIN: Ferritin: 6 ng/mL — ABNORMAL LOW (ref 11–307)

## 2022-12-08 LAB — IRON AND TIBC
Iron: 20 ug/dL — ABNORMAL LOW (ref 28–170)
Saturation Ratios: 4 % — ABNORMAL LOW (ref 10.4–31.8)
TIBC: 504 ug/dL — ABNORMAL HIGH (ref 250–450)
UIBC: 484 ug/dL

## 2022-12-08 LAB — RETICULOCYTES
Immature Retic Fract: 20.7 % — ABNORMAL HIGH (ref 2.3–15.9)
RBC.: 4.27 MIL/uL (ref 3.87–5.11)
Retic Count, Absolute: 118.7 10*3/uL (ref 19.0–186.0)
Retic Ct Pct: 2.8 % (ref 0.4–3.1)

## 2022-12-08 LAB — VITAMIN B12: Vitamin B-12: 1108 pg/mL — ABNORMAL HIGH (ref 180–914)

## 2022-12-08 LAB — LACTATE DEHYDROGENASE: LDH: 109 U/L (ref 98–192)

## 2022-12-08 LAB — TECHNOLOGIST SMEAR REVIEW
Plt Morphology: NORMAL
RBC MORPHOLOGY: NORMAL
WBC MORPHOLOGY: NORMAL

## 2022-12-08 LAB — TYPE AND SCREEN
ABO/RH(D): O POS
Antibody Screen: NEGATIVE

## 2022-12-08 MED ORDER — IOHEXOL 300 MG/ML  SOLN
60.0000 mL | Freq: Once | INTRAMUSCULAR | Status: AC | PRN
  Administered 2022-12-08: 60 mL via INTRAVENOUS

## 2022-12-08 NOTE — Assessment & Plan Note (Addendum)
#   Anemia- Hb-8.3; Ferritin- 6 [PCP- AUG 2024.]- WBC/platelets are normal. Patient is symptomatic.  Likely due to iron deficiency - from etiology GI blood loss/menorrhagia/malabsorption.    # Patient has poor tolerance/lack of improvement on oral iron.  Discussed regarding IV iron infusion/Venofer. Discussed the potential acute infusion reactions with IV iron; which are quite rare.  Patient understands the risk; will proceed with infusions.  #Etiology of iron deficiency: Unclear; I had a long discussion with the patient regarding multiple etiologies of anemia including iron deficiency-which is mainly caused by blood loss/malabsorption. Awaiting GI evaluation-EGD colonoscopy; capsule study; JULY 2024- CT scan abdomen pelvis- no acute process.    # CKD- III [PCP]/ DM    Thank you Dr. Hyacinth Meeker for allowing me to participate in the care of your pleasant patient. Please do not hesitate to contact me with questions or concerns in the interim.  # DISPOSITION: # labs today # appts- TBD- Dr.B ---------------- -# weekly venofer x3- start this week-   # follow up 6 weeks- MD; labs- cbc/bmp; possible venofer- Dr.B

## 2022-12-08 NOTE — Progress Notes (Signed)
[facility] CONSULT NOTE  Patient Care Team: Danella Penton, MD as PCP - General (Internal Medicine)  CHIEF COMPLAINTS/PURPOSE OF CONSULTATION: ANEMIA   HEMATOLOGY HISTORY  # ANEMIA[Hb; MCV-platelets- WBC; Iron sat; ferritin;  Kidney: stage III CKD- 30-40s.  CT/US- ;  colo/EGD: 2022;  EGD/colo- awaiting repeating EGD/Dr.Russow-   COPD with ongoing tobacco abuse, abnormal CT chest consistent with interstitial lung disease with recent pulmonary consultation, depression, diabetes, hypertension, PAD  HISTORY OF PRESENTING ILLNESS: Patient ambulating-independently. Accompanied by daughter.   Misty Adams 67 y.o.  female pleasant patient with multiple medical problems was been referred to [place] for further evaluation of anemia.  Of note patient was recently admitted to hospital in March for UTI sepsis.  More recently patient noted to have worsening anemia.  Patient poor tolerance to oral iron/lack of response.  Blood in stools: none; colo/EGD: 2022;  EGD/colo- awaiting repeating EGD/Dr.Russow-  Blood in urine:  none; Difficulty swallowing: none; Change of bowel movement/constipation: none; Prior blood transfusion:  none; Kidney: stage III CKD Liver disease: none  Alcohol: none  Bariatric surgery: none   Vaginal bleeding: none  Prior evaluation with hematology:none  Prior bone marrow biopsy: none  Oral iron: pills- constipation; not helping.  Prior IV iron infusions: none    Review of Systems  Constitutional:  Positive for malaise/fatigue. Negative for chills, diaphoresis, fever and weight loss.  HENT:  Negative for nosebleeds and sore throat.   Eyes:  Negative for double vision.  Respiratory:  Negative for cough, hemoptysis, sputum production, shortness of breath and wheezing.   Cardiovascular:  Negative for chest pain, palpitations, orthopnea and leg swelling.  Gastrointestinal:  Negative for abdominal pain, blood in stool, constipation, diarrhea, heartburn,  melena, nausea and vomiting.  Genitourinary:  Negative for dysuria, frequency and urgency.  Musculoskeletal:  Negative for back pain and joint pain.  Skin: Negative.  Negative for itching and rash.  Neurological:  Negative for dizziness, tingling, focal weakness, weakness and headaches.  Endo/Heme/Allergies:  Does not bruise/bleed easily.  Psychiatric/Behavioral:  Negative for depression. The patient is not nervous/anxious and does not have insomnia.    MEDICAL HISTORY:  Past Medical History:  Diagnosis Date   Allergy    Anxiety    CHF (congestive heart failure) (HCC)    COPD (chronic obstructive pulmonary disease) (HCC)    Depression    Diabetes mellitus without complication (HCC)    Hyperlipidemia    Hypertension    Hypothyroidism    PAD (peripheral artery disease) (HCC)    Psoriasis    Substance abuse (HCC)    Tobacco abuse     SURGICAL HISTORY: Past Surgical History:  Procedure Laterality Date   ABDOMINAL HYSTERECTOMY     AORTIC ARCH ANGIOGRAPHY N/A 08/06/2017   Procedure: AORTIC ARCH ANGIOGRAPHY;  Surgeon: Fransisco Hertz, MD;  Location: [facility] INVASIVE CV LAB;  Service: Cardiovascular;  Laterality: N/A;   APPENDECTOMY     CESAREAN SECTION     COLONOSCOPY     COLONOSCOPY WITH PROPOFOL N/A 12/07/2020   Procedure: COLONOSCOPY WITH PROPOFOL;  Surgeon: Jaynie Collins, DO;  Location: [facility] ENDOSCOPY;  Service: Gastroenterology;  Laterality: N/A;   dilatation and curettage     DILATION AND CURETTAGE OF UTERUS     ESOPHAGOGASTRODUODENOSCOPY N/A 12/07/2020   Procedure: ESOPHAGOGASTRODUODENOSCOPY (EGD);  Surgeon: Jaynie Collins, DO;  Location: [facility] ENDOSCOPY;  Service: Gastroenterology;  Laterality: N/A;   KNEE SURGERY     left wrist ganglion cyst removal  OOPHORECTOMY  1994   PERIPHERAL VASCULAR INTERVENTION Left 08/06/2017   Procedure: PERIPHERAL VASCULAR INTERVENTION;  Surgeon: Fransisco Hertz, MD;  Location: [facility] INVASIVE CV LAB;  Service: Cardiovascular;  Laterality:  Left;  subclavian   TUBAL LIGATION      SOCIAL HISTORY: Social History   Socioeconomic History   Marital status: Divorced    Spouse name: Not on file   Number of children: Not on file   Years of education: Not on file   Highest education level: Not on file  Occupational History   Not on file  Tobacco Use   Smoking status: Every Day    Current packs/day: 2.00    Average packs/day: 2.0 packs/day for 50.0 years (100.0 ttl pk-yrs)    Types: Cigarettes   Smokeless tobacco: Never  Vaping Use   Vaping status: Former  Substance and Sexual Activity   Alcohol use: Yes   Drug use: Not Currently   Sexual activity: Not Currently  Other Topics Concern   Not on file  Social History Narrative   Not on file   Social Determinants of Health   Financial Resource Strain: Not on file  Food Insecurity: No Food Insecurity (12/08/2022)   Hunger Vital Sign    Worried About Running Out of Food in the Last Year: Never true    Ran Out of Food in the Last Year: Never true  Transportation Needs: No Transportation Needs (12/08/2022)   PRAPARE - Administrator, Civil Service (Medical): No    Lack of Transportation (Non-Medical): No  Physical Activity: Not on file  Stress: Not on file  Social Connections: Not on file  Intimate Partner Violence: Not At Risk (12/08/2022)   Humiliation, Afraid, Rape, and Kick questionnaire    Fear of Current or Ex-Partner: No    Emotionally Abused: No    Physically Abused: No    Sexually Abused: No    FAMILY HISTORY: Family History  Problem Relation Age of Onset   Uterine cancer Mother    Stroke Mother    Aneurysm Mother    Heart attack Father    Coronary artery disease Father    Leukemia Father    Depression Father    Diabetes Father    Hyperlipidemia Father    Hypertension Father    Colon polyps Sister    Obesity Sister    Thyroid disease Sister    COPD Sister    Lung cancer Sister    Breast cancer Maternal Aunt    Breast cancer Maternal  Aunt     ALLERGIES:  is allergic to hydrocodone-acetaminophen, vicodin [hydrocodone-acetaminophen], and trelegy ellipta [fluticasone-umeclidin-vilant].  MEDICATIONS:  Current Outpatient Medications  Medication Sig Dispense Refill   acetaminophen (TYLENOL) 500 MG tablet Take 1,000 mg by mouth every 6 (six) hours as needed for moderate pain or headache.     albuterol (PROVENTIL HFA;VENTOLIN HFA) 108 (90 Base) MCG/ACT inhaler Inhale 2 puffs into the lungs every 6 (six) hours as needed for wheezing or shortness of breath. 1 Inhaler 0   ALPRAZolam (XANAX) 0.5 MG tablet Take 0.5 mg by mouth 2 (two) times daily as needed for anxiety.      aspirin 81 MG tablet Take 81 mg by mouth at bedtime.      atorvastatin (LIPITOR) 40 MG tablet Take 1 tablet (40 mg total) by mouth at bedtime. 30 tablet 0   BREZTRI AEROSPHERE 160-9-4.8 MCG/ACT AERO Inhale 2 puffs into the lungs 2 (two) times daily.  gabapentin (NEURONTIN) 100 MG capsule Take 200 mg by mouth at bedtime.     guaiFENesin-dextromethorphan (ROBITUSSIN DM) 100-10 MG/5ML syrup Take 10 mLs by mouth every 4 (four) hours as needed for cough. 118 mL 0   levothyroxine (SYNTHROID, LEVOTHROID) 125 MCG tablet Take 125 mcg by mouth daily.      ondansetron (ZOFRAN) 4 MG tablet Take 4 mg by mouth every 4 (four) hours as needed for nausea or vomiting.     pantoprazole (PROTONIX) 40 MG tablet Take 40 mg by mouth 2 (two) times daily.     temazepam (RESTORIL) 15 MG capsule Take 15 mg by mouth at bedtime as needed for sleep.     traZODone (DESYREL) 100 MG tablet Take 100 mg by mouth daily as needed for sleep.     venlafaxine XR (EFFEXOR-XR) 75 MG 24 hr capsule Take 150 mg by mouth daily.     ascorbic acid (VITAMIN C) 500 MG tablet Take 1 tablet (500 mg total) by mouth daily. (Patient not taking: Reported on 12/08/2022) 20 tablet 0   VITAMIN D PO Take 1 tablet by mouth daily. (Patient not taking: Reported on 12/08/2022)     No current facility-administered medications  for this visit.     PHYSICAL EXAMINATION:   Vitals:   12/08/22 1345  BP: (!) 115/53  Pulse: 67  Temp: (!) 97.4 F (36.3 C)  SpO2: 100%   Filed Weights   12/08/22 1345  Weight: 170 lb 6.4 oz (77.3 kg)    Physical Exam Vitals and nursing note reviewed.  HENT:     Head: Normocephalic and atraumatic.     Mouth/Throat:     Pharynx: Oropharynx is clear.  Eyes:     Extraocular Movements: Extraocular movements intact.     Pupils: Pupils are equal, round, and reactive to light.  Cardiovascular:     Rate and Rhythm: Normal rate and regular rhythm.  Pulmonary:     Comments: Decreased breath sounds bilaterally.  Abdominal:     Palpations: Abdomen is soft.  Musculoskeletal:        General: Normal range of motion.     Cervical back: Normal range of motion.  Skin:    General: Skin is warm.  Neurological:     General: No focal deficit present.     Mental Status: She is alert and oriented to person, place, and time.  Psychiatric:        Behavior: Behavior normal.        Judgment: Judgment normal.      LABORATORY DATA:  I have reviewed the data as listed Lab Results  Component Value Date   WBC 11.0 (H) 12/08/2022   HGB 8.7 (L) 12/08/2022   HCT 31.4 (L) 12/08/2022   MCV 73.5 (L) 12/08/2022   PLT 373 12/08/2022   Recent Labs    03/18/22 0035 07/14/22 1411 07/17/22 0921 07/18/22 0513 10/16/22 1431 10/16/22 1437 12/08/22 1446  NA 139   < > 133* 136  --   --  134*  K 4.4   < > 4.2 4.6  --   --  3.1*  CL 103   < > 108 111  --   --  92*  CO2 29   < > 17* 20*  --   --  31  GLUCOSE 143*   < > 84 128*  --   --  118*  BUN 27*   < > 61* 50*  --   --  25*  CREATININE 1.24*   < >  1.94* 1.92* 1.90* 1.90* 1.38*  CALCIUM 9.5   < > 8.2* 8.4*  --   --  9.3  GFRNONAA 47*   < > 28* 28*  --   --  41*  PROT 7.0  --   --   --   --   --  7.5  ALBUMIN 3.6  --   --   --   --   --  3.7  AST 15  --   --   --   --   --  14*  ALT 13  --   --   --   --   --  11  ALKPHOS 59  --   --    --   --   --  67  BILITOT 0.5  --   --   --   --   --  0.4   < > = values in this interval not displayed.     No results found.  ASSESSMENT & PLAN:   Symptomatic anemia # Anemia- Hb-8.3; Ferritin- 6 [PCP- AUG 2024.]- WBC/platelets are normal. Patient is symptomatic.  Likely due to iron deficiency - from etiology GI blood loss/menorrhagia/malabsorption.    # Patient has poor tolerance/lack of improvement on oral iron.  Discussed regarding IV iron infusion/Venofer. Discussed the potential acute infusion reactions with IV iron; which are quite rare.  Patient understands the risk; will proceed with infusions.  #Etiology of iron deficiency: Unclear; I had a long discussion with the patient regarding multiple etiologies of anemia including iron deficiency-which is mainly caused by blood loss/malabsorption. Awaiting GI evaluation-EGD colonoscopy; capsule study; JULY 2024- CT scan abdomen pelvis- no acute process.    # CKD- III [PCP]/ DM    Thank you Dr. Hyacinth Meeker for allowing me to participate in the care of your pleasant patient. Please do not hesitate to contact me with questions or concerns in the interim.  # DISPOSITION: # labs today # appts- TBD- Dr.B ---------------- -# weekly venofer x3- start this week-   # follow up 6 weeks- MD; labs- cbc/bmp; possible venofer- Dr.B  All questions were answered. The patient knows to call the clinic with any problems, questions or concerns.   Earna Coder, MD 12/08/2022 3:22 PM

## 2022-12-08 NOTE — Progress Notes (Signed)
CT June 2024 CAP.  Does have trouble sleeping, on sleep aids.  Appetite is 25% normal. No supplement drinks.  Has nausea and vomiting.  Has constipation, uses dulcolax.  Has COPD.

## 2022-12-10 ENCOUNTER — Inpatient Hospital Stay: Payer: Medicare HMO

## 2022-12-10 VITALS — BP 160/59 | HR 72 | Temp 97.7°F

## 2022-12-10 DIAGNOSIS — E785 Hyperlipidemia, unspecified: Secondary | ICD-10-CM | POA: Diagnosis not present

## 2022-12-10 DIAGNOSIS — D649 Anemia, unspecified: Secondary | ICD-10-CM

## 2022-12-10 DIAGNOSIS — E039 Hypothyroidism, unspecified: Secondary | ICD-10-CM | POA: Diagnosis not present

## 2022-12-10 DIAGNOSIS — I130 Hypertensive heart and chronic kidney disease with heart failure and stage 1 through stage 4 chronic kidney disease, or unspecified chronic kidney disease: Secondary | ICD-10-CM | POA: Diagnosis not present

## 2022-12-10 DIAGNOSIS — K909 Intestinal malabsorption, unspecified: Secondary | ICD-10-CM | POA: Diagnosis not present

## 2022-12-10 DIAGNOSIS — I509 Heart failure, unspecified: Secondary | ICD-10-CM | POA: Diagnosis not present

## 2022-12-10 DIAGNOSIS — E1122 Type 2 diabetes mellitus with diabetic chronic kidney disease: Secondary | ICD-10-CM | POA: Diagnosis not present

## 2022-12-10 DIAGNOSIS — D509 Iron deficiency anemia, unspecified: Secondary | ICD-10-CM | POA: Diagnosis not present

## 2022-12-10 DIAGNOSIS — N183 Chronic kidney disease, stage 3 unspecified: Secondary | ICD-10-CM | POA: Diagnosis not present

## 2022-12-10 DIAGNOSIS — F419 Anxiety disorder, unspecified: Secondary | ICD-10-CM | POA: Diagnosis not present

## 2022-12-10 MED ORDER — SODIUM CHLORIDE 0.9 % IV SOLN
Freq: Once | INTRAVENOUS | Status: AC
  Filled 2022-12-10: qty 250

## 2022-12-10 MED ORDER — SODIUM CHLORIDE 0.9 % IV SOLN
200.0000 mg | Freq: Once | INTRAVENOUS | Status: AC
  Administered 2022-12-10: 200 mg via INTRAVENOUS
  Filled 2022-12-10: qty 200

## 2022-12-17 ENCOUNTER — Inpatient Hospital Stay: Payer: Medicare HMO

## 2022-12-17 VITALS — BP 141/53 | HR 81 | Temp 99.5°F

## 2022-12-17 DIAGNOSIS — E785 Hyperlipidemia, unspecified: Secondary | ICD-10-CM | POA: Diagnosis not present

## 2022-12-17 DIAGNOSIS — K909 Intestinal malabsorption, unspecified: Secondary | ICD-10-CM | POA: Diagnosis not present

## 2022-12-17 DIAGNOSIS — I509 Heart failure, unspecified: Secondary | ICD-10-CM | POA: Diagnosis not present

## 2022-12-17 DIAGNOSIS — E039 Hypothyroidism, unspecified: Secondary | ICD-10-CM | POA: Diagnosis not present

## 2022-12-17 DIAGNOSIS — I130 Hypertensive heart and chronic kidney disease with heart failure and stage 1 through stage 4 chronic kidney disease, or unspecified chronic kidney disease: Secondary | ICD-10-CM | POA: Diagnosis not present

## 2022-12-17 DIAGNOSIS — D509 Iron deficiency anemia, unspecified: Secondary | ICD-10-CM | POA: Diagnosis not present

## 2022-12-17 DIAGNOSIS — E1122 Type 2 diabetes mellitus with diabetic chronic kidney disease: Secondary | ICD-10-CM | POA: Diagnosis not present

## 2022-12-17 DIAGNOSIS — N183 Chronic kidney disease, stage 3 unspecified: Secondary | ICD-10-CM | POA: Diagnosis not present

## 2022-12-17 DIAGNOSIS — F419 Anxiety disorder, unspecified: Secondary | ICD-10-CM | POA: Diagnosis not present

## 2022-12-17 DIAGNOSIS — D649 Anemia, unspecified: Secondary | ICD-10-CM

## 2022-12-17 MED ORDER — SODIUM CHLORIDE 0.9 % IV SOLN
Freq: Once | INTRAVENOUS | Status: AC
  Filled 2022-12-17: qty 250

## 2022-12-17 MED ORDER — SODIUM CHLORIDE 0.9 % IV SOLN
200.0000 mg | Freq: Once | INTRAVENOUS | Status: AC
  Administered 2022-12-17: 200 mg via INTRAVENOUS
  Filled 2022-12-17: qty 200

## 2022-12-19 ENCOUNTER — Ambulatory Visit: Payer: Medicare HMO | Admitting: Anesthesiology

## 2022-12-19 ENCOUNTER — Ambulatory Visit
Admission: RE | Admit: 2022-12-19 | Discharge: 2022-12-19 | Disposition: A | Discharge status: Home / Self Care | Payer: Medicare HMO | Attending: Gastroenterology | Admitting: Gastroenterology

## 2022-12-19 ENCOUNTER — Encounter
Admission: RE | Disposition: A | Discharge status: Home / Self Care | Payer: Self-pay | Source: Home / Self Care | Attending: Gastroenterology

## 2022-12-19 ENCOUNTER — Encounter: Payer: Self-pay | Admitting: Gastroenterology

## 2022-12-19 DIAGNOSIS — K644 Residual hemorrhoidal skin tags: Secondary | ICD-10-CM | POA: Insufficient documentation

## 2022-12-19 DIAGNOSIS — K5730 Diverticulosis of large intestine without perforation or abscess without bleeding: Secondary | ICD-10-CM | POA: Insufficient documentation

## 2022-12-19 DIAGNOSIS — K635 Polyp of colon: Secondary | ICD-10-CM | POA: Diagnosis not present

## 2022-12-19 DIAGNOSIS — D122 Benign neoplasm of ascending colon: Secondary | ICD-10-CM | POA: Diagnosis not present

## 2022-12-19 DIAGNOSIS — Z1211 Encounter for screening for malignant neoplasm of colon: Secondary | ICD-10-CM | POA: Insufficient documentation

## 2022-12-19 DIAGNOSIS — Q438 Other specified congenital malformations of intestine: Secondary | ICD-10-CM | POA: Diagnosis not present

## 2022-12-19 DIAGNOSIS — K3189 Other diseases of stomach and duodenum: Secondary | ICD-10-CM | POA: Diagnosis not present

## 2022-12-19 DIAGNOSIS — K5520 Angiodysplasia of colon without hemorrhage: Secondary | ICD-10-CM | POA: Insufficient documentation

## 2022-12-19 DIAGNOSIS — K621 Rectal polyp: Secondary | ICD-10-CM | POA: Diagnosis not present

## 2022-12-19 DIAGNOSIS — K449 Diaphragmatic hernia without obstruction or gangrene: Secondary | ICD-10-CM | POA: Insufficient documentation

## 2022-12-19 DIAGNOSIS — K269 Duodenal ulcer, unspecified as acute or chronic, without hemorrhage or perforation: Secondary | ICD-10-CM | POA: Diagnosis not present

## 2022-12-19 DIAGNOSIS — I1310 Hypertensive heart and chronic kidney disease without heart failure, with stage 1 through stage 4 chronic kidney disease, or unspecified chronic kidney disease: Secondary | ICD-10-CM | POA: Diagnosis not present

## 2022-12-19 DIAGNOSIS — N1832 Chronic kidney disease, stage 3b: Secondary | ICD-10-CM | POA: Diagnosis not present

## 2022-12-19 DIAGNOSIS — I509 Heart failure, unspecified: Secondary | ICD-10-CM | POA: Diagnosis not present

## 2022-12-19 DIAGNOSIS — K648 Other hemorrhoids: Secondary | ICD-10-CM | POA: Diagnosis not present

## 2022-12-19 DIAGNOSIS — K5790 Diverticulosis of intestine, part unspecified, without perforation or abscess without bleeding: Secondary | ICD-10-CM | POA: Diagnosis not present

## 2022-12-19 DIAGNOSIS — I110 Hypertensive heart disease with heart failure: Secondary | ICD-10-CM | POA: Diagnosis not present

## 2022-12-19 DIAGNOSIS — D123 Benign neoplasm of transverse colon: Secondary | ICD-10-CM | POA: Diagnosis not present

## 2022-12-19 DIAGNOSIS — K319 Disease of stomach and duodenum, unspecified: Secondary | ICD-10-CM | POA: Diagnosis not present

## 2022-12-19 DIAGNOSIS — Z86010 Personal history of colonic polyps: Secondary | ICD-10-CM | POA: Insufficient documentation

## 2022-12-19 DIAGNOSIS — D509 Iron deficiency anemia, unspecified: Secondary | ICD-10-CM | POA: Diagnosis not present

## 2022-12-19 DIAGNOSIS — K649 Unspecified hemorrhoids: Secondary | ICD-10-CM | POA: Diagnosis not present

## 2022-12-19 HISTORY — PX: BIOPSY: SHX5522

## 2022-12-19 HISTORY — PX: POLYPECTOMY: SHX5525

## 2022-12-19 HISTORY — PX: COLONOSCOPY WITH PROPOFOL: SHX5780

## 2022-12-19 HISTORY — PX: ESOPHAGOGASTRODUODENOSCOPY (EGD) WITH PROPOFOL: SHX5813

## 2022-12-19 LAB — GLUCOSE, CAPILLARY: Glucose-Capillary: 134 mg/dL — ABNORMAL HIGH (ref 70–99)

## 2022-12-19 SURGERY — COLONOSCOPY WITH PROPOFOL
Anesthesia: General

## 2022-12-19 MED ORDER — PROPOFOL 500 MG/50ML IV EMUL
INTRAVENOUS | Status: DC | PRN
  Administered 2022-12-19: 100 ug/kg/min via INTRAVENOUS

## 2022-12-19 MED ORDER — PROPOFOL 10 MG/ML IV BOLUS
INTRAVENOUS | Status: DC | PRN
  Administered 2022-12-19: 80 mg via INTRAVENOUS
  Administered 2022-12-19: 20 mg via INTRAVENOUS

## 2022-12-19 MED ORDER — PROPOFOL 10 MG/ML IV BOLUS
INTRAVENOUS | Status: AC
  Filled 2022-12-19: qty 40

## 2022-12-19 MED ORDER — SODIUM CHLORIDE 0.9 % IV SOLN
INTRAVENOUS | Status: DC
  Administered 2022-12-19: 20 mL/h via INTRAVENOUS

## 2022-12-19 MED ORDER — LIDOCAINE HCL (CARDIAC) PF 100 MG/5ML IV SOSY
PREFILLED_SYRINGE | INTRAVENOUS | Status: DC | PRN
  Administered 2022-12-19: 50 mg via INTRAVENOUS

## 2022-12-19 MED ORDER — DEXMEDETOMIDINE HCL IN NACL 80 MCG/20ML IV SOLN
INTRAVENOUS | Status: DC | PRN
  Administered 2022-12-19: 8 ug via INTRAVENOUS

## 2022-12-19 NOTE — Op Note (Signed)
[facility] Gastroenterology Patient Name: Misty Adams Procedure Date: 12/19/2022 11:30 AM MRN: [id number] Account #: [id number] Date of Birth: 1956/04/05 Admit Type: Outpatient Age: 67 Room: [facility] ENDO ROOM 1 Gender: Female Note Status: Finalized Instrument Name: Upper Endoscope [id number] Procedure:             Upper GI endoscopy Indications:           Iron deficiency anemia Providers:             Jaynie Collins DO, DO Referring MD:          Danella Penton, MD (Referring MD) Medicines:             Monitored Anesthesia Care Complications:         No immediate complications. Estimated blood loss:                         Minimal. Procedure:             Pre-Anesthesia Assessment:                        - Prior to the procedure, a History and Physical was                         performed, and patient medications and allergies were                         reviewed. The patient is competent. The risks and                         benefits of the procedure and the sedation options and                         risks were discussed with the patient. All questions                         were answered and informed consent was obtained.                         Patient identification and proposed procedure were                         verified by the physician, the nurse, the anesthetist                         and the technician in the endoscopy suite. Mental                         Status Examination: alert and oriented. Airway                         Examination: normal oropharyngeal airway and neck                         mobility. Respiratory Examination: clear to                         auscultation. CV Examination: RRR, no murmurs, no S3  or S4. Prophylactic Antibiotics: The patient does not                         require prophylactic antibiotics. Prior                         Anticoagulants: The patient has taken no anticoagulant                          or antiplatelet agents. ASA Grade Assessment: III - A                         patient with severe systemic disease. After reviewing                         the risks and benefits, the patient was deemed in                         satisfactory condition to undergo the procedure. The                         anesthesia plan was to use monitored anesthesia care                         (MAC). Immediately prior to administration of                         medications, the patient was re-assessed for adequacy                         to receive sedatives. The heart rate, respiratory                         rate, oxygen saturations, blood pressure, adequacy of                         pulmonary ventilation, and response to care were                         monitored throughout the procedure. The physical                         status of the patient was re-assessed after the                         procedure.                        After obtaining informed consent, the endoscope was                         passed under direct vision. Throughout the procedure,                         the patient's blood pressure, pulse, and oxygen                         saturations were monitored continuously. The Endoscope  was introduced through the mouth, and advanced to the                         second part of duodenum. The upper GI endoscopy was                         accomplished without difficulty. The patient tolerated                         the procedure well. Findings:      One non-bleeding cratered duodenal ulcer with no stigmata of bleeding       was found in the duodenal bulb. The lesion was 3 mm in largest       dimension. Biopsies were taken with a cold forceps for histology.       Estimated blood loss was minimal.      The exam of the duodenum was otherwise normal.      The entire examined stomach was normal. Estimated blood loss: none.      The Z-line was  regular. Estimated blood loss: none.      Esophagogastric landmarks were identified: the gastroesophageal junction       was found at 35 cm from the incisors.      The exam of the esophagus was otherwise normal. Impression:            - Non-bleeding duodenal ulcer with no stigmata of                         bleeding. Biopsied.                        - Normal stomach.                        - Z-line regular.                        - Esophagogastric landmarks identified. Recommendation:        - Patient has a contact number available for                         emergencies. The signs and symptoms of potential                         delayed complications were discussed with the patient.                         Return to normal activities tomorrow. Written                         discharge instructions were provided to the patient.                        - Discharge patient to home.                        - Resume previous diet.                        - Continue present medications.                        -  No aspirin, ibuprofen, naproxen, or other                         non-steroidal anti-inflammatory drugs.                        - Use Protonix (pantoprazole) 40 mg PO BID for 8 weeks.                        - Await pathology results.                        - Return to GI clinic as previously scheduled.                        - proceed with colonoscopy. see report for further                         recommendations.                        - The findings and recommendations were discussed with                         the patient. Procedure Code(s):     --- Professional ---                        [phone], Esophagogastroduodenoscopy, flexible,                         transoral; with biopsy, single or multiple Diagnosis Code(s):     --- Professional ---                        K26.9, Duodenal ulcer, unspecified as acute or                         chronic, without hemorrhage or perforation                         D50.9, Iron deficiency anemia, unspecified CPT copyright 2022 American Medical Association. All rights reserved. The codes documented in this report are preliminary and upon coder review may  be revised to meet current compliance requirements. Attending Participation:      I personally performed the entire procedure. Elfredia Nevins, DO Jaynie Collins DO, DO 12/19/2022 11:55:05 AM This report has been signed electronically. Number of Addenda: 0 Note Initiated On: 12/19/2022 11:30 AM Estimated Blood Loss:  Estimated blood loss was minimal.      [facility]

## 2022-12-19 NOTE — Interval H&P Note (Signed)
History and Physical Interval Note: Preprocedure H&P from 12/19/22  was reviewed and there was no interval change after seeing and examining the patient.  Written consent was obtained from the patient after discussion of risks, benefits, and alternatives. Patient has consented to proceed with Esophagogastroduodenoscopy and Colonoscopy with possible intervention    12/19/2022 11:38 AM  Misty Adams  has presented today for surgery, with the diagnosis of 280.9 (ICD-9-CM) - D50.9 (ICD-10-CM) - Iron deficiency anemia, unspecified iron deficiency anemia type.  The various methods of treatment have been discussed with the patient and family. After consideration of risks, benefits and other options for treatment, the patient has consented to  Procedure(s): COLONOSCOPY WITH PROPOFOL (N/A) ESOPHAGOGASTRODUODENOSCOPY (EGD) WITH PROPOFOL (N/A) as a surgical intervention.  The patient's history has been reviewed, patient examined, no change in status, stable for surgery.  I have reviewed the patient's chart and labs.  Questions were answered to the patient's satisfaction.     Jaynie Collins

## 2022-12-19 NOTE — Transfer of Care (Signed)
Immediate Anesthesia Transfer of Care Note  Patient: Misty Adams  Procedure(s) Performed: COLONOSCOPY WITH PROPOFOL ESOPHAGOGASTRODUODENOSCOPY (EGD) WITH PROPOFOL BIOPSY POLYPECTOMY  Patient Location: PACU and Endoscopy Unit  Anesthesia Type:General  Level of Consciousness: drowsy and patient cooperative  Airway & Oxygen Therapy: Patient Spontanous Breathing  Post-op Assessment: Report given to RN and Post -op Vital signs reviewed and stable  Post vital signs: Reviewed and stable  Last Vitals:  Vitals Value Taken Time  BP 129/46 12/19/22 1240  Temp 36.8 C 12/19/22 1239  Pulse 75 12/19/22 1240  Resp 18 12/19/22 1240  SpO2 95 % 12/19/22 1240  Vitals shown include unfiled device data.  Last Pain:  Vitals:   12/19/22 1239  TempSrc: Temporal  PainSc: 0-No pain         Complications: No notable events documented.

## 2022-12-19 NOTE — Op Note (Signed)
[facility] Gastroenterology Patient Name: Misty Adams Procedure Date: 12/19/2022 11:30 AM MRN: [id number] Account #: [id number] Date of Birth: 1956-02-22 Admit Type: Outpatient Age: 67 Room: [facility] ENDO ROOM 1 Gender: Female Note Status: Supervisor Override Instrument Name: Peds Colonoscope [id number] Procedure:             Colonoscopy Indications:           High risk colon cancer surveillance: Personal history                         of colonic polyps, Iron deficiency anemia Providers:             Trenda Moots, DO Referring MD:          Danella Penton, MD (Referring MD) Medicines:             Monitored Anesthesia Care Complications:         No immediate complications. Estimated blood loss:                         Minimal. Procedure:             Pre-Anesthesia Assessment:                        - Prior to the procedure, a History and Physical was                         performed, and patient medications and allergies were                         reviewed. The patient is competent. The risks and                         benefits of the procedure and the sedation options and                         risks were discussed with the patient. All questions                         were answered and informed consent was obtained.                         Patient identification and proposed procedure were                         verified by the physician, the nurse, the anesthetist                         and the technician in the endoscopy suite. Mental                         Status Examination: alert and oriented. Airway                         Examination: normal oropharyngeal airway and neck                         mobility. Respiratory Examination: clear to  auscultation. CV Examination: RRR, no murmurs, no S3                         or S4. Prophylactic Antibiotics: The patient does not                         require prophylactic  antibiotics. Prior                         Anticoagulants: The patient has taken no anticoagulant                         or antiplatelet agents. ASA Grade Assessment: III - A                         patient with severe systemic disease. After reviewing                         the risks and benefits, the patient was deemed in                         satisfactory condition to undergo the procedure. The                         anesthesia plan was to use monitored anesthesia care                         (MAC). Immediately prior to administration of                         medications, the patient was re-assessed for adequacy                         to receive sedatives. The heart rate, respiratory                         rate, oxygen saturations, blood pressure, adequacy of                         pulmonary ventilation, and response to care were                         monitored throughout the procedure. The physical                         status of the patient was re-assessed after the                         procedure.                        After obtaining informed consent, the colonoscope was                         passed under direct vision. Throughout the procedure,                         the patient's blood pressure, pulse, and oxygen  saturations were monitored continuously. The                         Colonoscope was introduced through the anus and                         advanced to the the cecum, identified by appendiceal                         orifice and ileocecal valve. The colonoscopy was                         performed without difficulty. The patient tolerated                         the procedure well. The quality of the bowel                         preparation was evaluated using the BBPS [facility] Bowel                         Preparation Scale) with scores of: Right Colon = 2                         (minor amount of residual staining, small  fragments of                         stool and/or opaque liquid, but mucosa seen well),                         Transverse Colon = 3 (entire mucosa seen well with no                         residual staining, small fragments of stool or opaque                         liquid) and Left Colon = 2 (minor amount of residual                         staining, small fragments of stool and/or opaque                         liquid, but mucosa seen well). The total BBPS score                         equals 7. The quality of the bowel preparation was                         good. The ileocecal valve, appendiceal orifice, and                         rectum were photographed. Findings:      Hemorrhoids were found on perianal exam.      The digital rectal exam was normal. Pertinent negatives include normal       sphincter tone.      Five sessile polyps were found in the rectum (2), transverse colon (1)  and ascending colon (2). The polyps were 1 to 2 mm in size. These polyps       were removed with a jumbo cold forceps. Resection and retrieval were       complete. Estimated blood loss was minimal.      Two sessile polyps were found in the descending colon and transverse       colon. The polyps were 3 to 4 mm in size. These polyps were removed with       a cold snare. Resection and retrieval were complete. Estimated blood       loss was minimal.      A 13 to 15 mm polyp was found in the distal ascending colon. The polyp       was sessile. The polyp was removed with a piecemeal technique using a       hot snare. Resection and retrieval were complete. Estimated blood loss       was minimal.      Multiple small-mouthed diverticula were found in the left colon.       Estimated blood loss: none.      Non-bleeding external and internal hemorrhoids were found during       retroflexion and during perianal exam. Estimated blood loss: none.      A single small localized angioectasia without bleeding was  found in the       ascending colon. Estimated blood loss: none.      Attempted to intubate TI and perform right colon retroflexion, however,       was not able to do this, Estimated blood loss: none.      The sigmoid colon was moderately redundant. Estimated blood loss: none.      There was moderate spasm in the entire colon. Estimated blood loss: none.      The exam was otherwise without abnormality on direct and retroflexion       views. Impression:            - Hemorrhoids found on perianal exam.                        - Five 1 to 2 mm polyps in the rectum, in the                         transverse colon and in the ascending colon, removed                         with a jumbo cold forceps. Resected and retrieved.                        - Two 3 to 4 mm polyps in the descending colon and in                         the transverse colon, removed with a cold snare.                         Resected and retrieved.                        - One 13 to 15 mm polyp in the distal ascending colon,  removed piecemeal using a hot snare. Resected and                         retrieved.                        - Diverticulosis in the left colon.                        - Non-bleeding external and internal hemorrhoids.                        - A single non-bleeding colonic angioectasia.                        - Redundant colon.                        - Moderate colonic spasm.                        - The examination was otherwise normal on direct and                         retroflexion views. Recommendation:        - Patient has a contact number available for                         emergencies. The signs and symptoms of potential                         delayed complications were discussed with the patient.                         Return to normal activities tomorrow. Written                         discharge instructions were provided to the patient.                        -  Discharge patient to home.                        - Resume previous diet.                        - Continue present medications.                        - No ibuprofen, naproxen, or other non-steroidal                         anti-inflammatory drugs for 5 days after polyp removal.                        - Await pathology results.                        - Repeat colonoscopy in 6 months for surveillance                         after piecemeal polypectomy.                        -  Return to GI office as previously scheduled.                        - The findings and recommendations were discussed with                         the patient.                        - Suspect iron deficiency from duodenal ulcer. No                         clear source of weight loss on bidirectional                         endoscopy. Continue twice a day ppi and iron infusions. Procedure Code(s):     --- Professional ---                        [phone], Colonoscopy, flexible; with removal of                         tumor(s), polyp(s), or other lesion(s) by snare                         technique                        [id number], 59, Colonoscopy, flexible; with biopsy, single                         or multiple Diagnosis Code(s):     --- Professional ---                        D12.8, Benign neoplasm of rectum                        D12.2, Benign neoplasm of ascending colon                        D12.4, Benign neoplasm of descending colon                        D12.3, Benign neoplasm of transverse colon (hepatic                         flexure or splenic flexure)                        K64.8, Other hemorrhoids                        K58.9, Irritable bowel syndrome without diarrhea                        K55.20, Angiodysplasia of colon without hemorrhage                        D50.9, Iron deficiency anemia, unspecified                        K57.30, Diverticulosis of  large intestine without                          perforation or abscess without bleeding                        Q43.8, Other specified congenital malformations of                         intestine CPT copyright 2022 American Medical Association. All rights reserved. The codes documented in this report are preliminary and upon coder review may  be revised to meet current compliance requirements. Attending Participation:      I personally performed the entire procedure. Elfredia Nevins, DO Jaynie Collins DO, DO 12/19/2022 12:55:55 PM This report has been signed electronically. Number of Addenda: 0 Note Initiated On: 12/19/2022 11:30 AM Scope Withdrawal Time: 0 hours 28 minutes 27 seconds  Total Procedure Duration: 0 hours 37 minutes 25 seconds  Estimated Blood Loss:  Estimated blood loss was minimal.      [facility]

## 2022-12-19 NOTE — Anesthesia Preprocedure Evaluation (Signed)
Anesthesia Evaluation  Patient identified by MRN, date of birth, ID band Patient awake    Reviewed: Allergy & Precautions, NPO status , Patient's Chart, lab work & pertinent test results  History of Anesthesia Complications Negative for: history of anesthetic complications  Airway Mallampati: III  TM Distance: >3 FB Neck ROM: full    Dental  (+) Upper Dentures, Lower Dentures   Pulmonary COPD,  COPD inhaler and oxygen dependent, Current Smoker and Patient abstained from smoking.   Pulmonary exam normal        Cardiovascular Exercise Tolerance: Good hypertension, (-) angina + Peripheral Vascular Disease and +CHF  Normal cardiovascular exam     Neuro/Psych  PSYCHIATRIC DISORDERS      negative neurological ROS  negative psych ROS   GI/Hepatic negative GI ROS, Neg liver ROS,,,  Endo/Other  diabetes, Type 2Hypothyroidism    Renal/GU negative Renal ROS  negative genitourinary   Musculoskeletal   Abdominal   Peds  Hematology negative hematology ROS (+)   Anesthesia Other Findings Past Medical History: No date: Allergy No date: Anxiety No date: CHF (congestive heart failure) (HCC) No date: COPD (chronic obstructive pulmonary disease) (HCC) No date: Depression No date: Diabetes mellitus without complication (HCC) No date: Hyperlipidemia No date: Hypertension No date: Hypothyroidism No date: PAD (peripheral artery disease) (HCC) No date: Psoriasis No date: Substance abuse (HCC) No date: Tobacco abuse  Past Surgical History: No date: ABDOMINAL HYSTERECTOMY 08/06/2017: AORTIC ARCH ANGIOGRAPHY; N/A     Comment:  Procedure: AORTIC ARCH ANGIOGRAPHY;  Surgeon: Fransisco Hertz, MD;  Location: [facility] INVASIVE CV LAB;  Service:               Cardiovascular;  Laterality: N/A; No date: APPENDECTOMY No date: CESAREAN SECTION No date: COLONOSCOPY No date: dilatation and curettage No date: DILATION AND CURETTAGE OF  UTERUS No date: KNEE SURGERY No date: left wrist ganglion cyst removal 1994: OOPHORECTOMY 08/06/2017: PERIPHERAL VASCULAR INTERVENTION; Left     Comment:  Procedure: PERIPHERAL VASCULAR INTERVENTION;  Surgeon:               Fransisco Hertz, MD;  Location: [facility] INVASIVE CV LAB;                Service: Cardiovascular;  Laterality: Left;  subclavian No date: TUBAL LIGATION  BMI    Body Mass Index: 27.46 kg/m      Reproductive/Obstetrics negative OB ROS                             Anesthesia Physical Anesthesia Plan  ASA: 3  Anesthesia Plan: General   Post-op Pain Management:    Induction: Intravenous  PONV Risk Score and Plan: Propofol infusion and TIVA  Airway Management Planned: Natural Airway and Nasal Cannula  Additional Equipment:   Intra-op Plan:   Post-operative Plan:   Informed Consent: I have reviewed the patients History and Physical, chart, labs and discussed the procedure including the risks, benefits and alternatives for the proposed anesthesia with the patient or authorized representative who has indicated his/her understanding and acceptance.     Dental Advisory Given  Plan Discussed with: Anesthesiologist, CRNA and Surgeon  Anesthesia Plan Comments: (Patient consented for risks of anesthesia including but not limited to:  - adverse reactions to medications - risk of airway placement if required - damage to eyes, teeth, lips or other  oral mucosa - nerve damage due to positioning  - sore throat or hoarseness - Damage to heart, brain, nerves, lungs, other parts of body or loss of life  Patient voiced understanding.)        Anesthesia Quick Evaluation

## 2022-12-19 NOTE — H&P (Signed)
Pre-Procedure H&P   Patient ID: Misty Adams is a 67 y.o. female.  Gastroenterology Provider: Jaynie Collins, DO  Referring Provider: Tawni Pummel, PA PCP: Danella Penton, MD  Date: 12/19/2022  HPI Misty Adams is a 67 y.o. female who presents today for Esophagogastroduodenoscopy and Colonoscopy for symptomatic anemia.  Patient has been losing weight but not noted any other GI symptoms.  She denies any melena or hematochezia.  She deals with constipation with a bowel movement approximately every 3 days and uses Dulcolax.  No abdominal pain nausea or vomiting.  Hemoglobin has distended from 15.6 in February down to 8.7 with an MCV of 73.  Ferritin is 6 TIBC 504 saturation 4 B12 1108 creatinine 1.38  Last underwent EGD and colonoscopy in August 2022 demonstrating adenomatous polyp gastritis.  Biopsies were negative for celiac disease and H. pylori at that time  Colonoscopy in April 2019 demonstrating diverticulosis internal hemorrhoids and 1 sessile serrated polyp   Past Medical History:  Diagnosis Date   Allergy    Anxiety    CHF (congestive heart failure) (HCC)    COPD (chronic obstructive pulmonary disease) (HCC)    Depression    Diabetes mellitus without complication (HCC)    Hyperlipidemia    Hypertension    Hypothyroidism    PAD (peripheral artery disease) (HCC)    Psoriasis    Substance abuse (HCC)    Tobacco abuse     Past Surgical History:  Procedure Laterality Date   ABDOMINAL HYSTERECTOMY     AORTIC ARCH ANGIOGRAPHY N/A 08/06/2017   Procedure: AORTIC ARCH ANGIOGRAPHY;  Surgeon: Fransisco Hertz, MD;  [facility]: [facility];  Service: Cardiovascular;  Laterality: N/A;   APPENDECTOMY     CESAREAN SECTION     COLONOSCOPY     COLONOSCOPY WITH PROPOFOL N/A 12/07/2020   Procedure: COLONOSCOPY WITH PROPOFOL;  Surgeon: Jaynie Collins, DO;  Location: [facility] ENDOSCOPY;  Service: Gastroenterology;  Laterality: N/A;   dilatation and  curettage     DILATION AND CURETTAGE OF UTERUS     ESOPHAGOGASTRODUODENOSCOPY N/A 12/07/2020   Procedure: ESOPHAGOGASTRODUODENOSCOPY (EGD);  Surgeon: Jaynie Collins, DO;  Location: [facility] ENDOSCOPY;  Service: Gastroenterology;  Laterality: N/A;   KNEE SURGERY     left wrist ganglion cyst removal     OOPHORECTOMY  1994   PERIPHERAL VASCULAR INTERVENTION Left 08/06/2017   Procedure: PERIPHERAL VASCULAR INTERVENTION;  Surgeon: Fransisco Hertz, MD;  Location: [facility] INVASIVE CV LAB;  Service: Cardiovascular;  Laterality: Left;  subclavian   TUBAL LIGATION      Family History Sister- polyps No h/o GI disease or malignancy  Review of Systems  Constitutional:  Positive for fatigue and fever. Negative for activity change, appetite change, chills, diaphoresis and unexpected weight change.  HENT:  Negative for trouble swallowing and voice change.   Respiratory:  Negative for shortness of breath and wheezing.   Cardiovascular:  Negative for chest pain, palpitations and leg swelling.  Gastrointestinal:  Positive for constipation. Negative for abdominal distention, abdominal pain, anal bleeding, blood in stool, diarrhea, nausea, rectal pain and vomiting.  Musculoskeletal:  Negative for arthralgias and myalgias.  Skin:  Negative for color change and pallor.  Neurological:  Positive for weakness. Negative for dizziness and syncope.  Psychiatric/Behavioral:  Negative for confusion.   All other systems reviewed and are negative.    Medications No current facility-administered medications on file prior to encounter.   Current Outpatient Medications on File Prior to  Encounter  Medication Sig Dispense Refill   acetaminophen (TYLENOL) 500 MG tablet Take 1,000 mg by mouth every 6 (six) hours as needed for moderate pain or headache.     albuterol (PROVENTIL HFA;VENTOLIN HFA) 108 (90 Base) MCG/ACT inhaler Inhale 2 puffs into the lungs every 6 (six) hours as needed for wheezing or shortness of breath. 1  Inhaler 0   ALPRAZolam (XANAX) 0.5 MG tablet Take 0.5 mg by mouth 2 (two) times daily as needed for anxiety.      aspirin 81 MG tablet Take 81 mg by mouth at bedtime.      atorvastatin (LIPITOR) 40 MG tablet Take 1 tablet (40 mg total) by mouth at bedtime. 30 tablet 0   BREZTRI AEROSPHERE 160-9-4.8 MCG/ACT AERO Inhale 2 puffs into the lungs 2 (two) times daily.     gabapentin (NEURONTIN) 100 MG capsule Take 200 mg by mouth at bedtime.     guaiFENesin-dextromethorphan (ROBITUSSIN DM) 100-10 MG/5ML syrup Take 10 mLs by mouth every 4 (four) hours as needed for cough. 118 mL 0   levothyroxine (SYNTHROID, LEVOTHROID) 125 MCG tablet Take 125 mcg by mouth daily.      ondansetron (ZOFRAN) 4 MG tablet Take 4 mg by mouth every 4 (four) hours as needed for nausea or vomiting.     pantoprazole (PROTONIX) 40 MG tablet Take 40 mg by mouth 2 (two) times daily.     temazepam (RESTORIL) 15 MG capsule Take 15 mg by mouth at bedtime as needed for sleep.     ascorbic acid (VITAMIN C) 500 MG tablet Take 1 tablet (500 mg total) by mouth daily. (Patient not taking: Reported on 12/08/2022) 20 tablet 0   traZODone (DESYREL) 100 MG tablet Take 100 mg by mouth daily as needed for sleep.     venlafaxine XR (EFFEXOR-XR) 75 MG 24 hr capsule Take 150 mg by mouth daily.     VITAMIN D PO Take 1 tablet by mouth daily. (Patient not taking: Reported on 12/08/2022)      Pertinent medications related to GI and procedure were reviewed by me with the patient prior to the procedure   Current Facility-Administered Medications:    0.9 %  sodium chloride infusion, , Intravenous, Continuous, Jaynie Collins, DO, Last Rate: 20 mL/hr at 12/19/22 1126, 20 mL/hr at 12/19/22 1126  sodium chloride 20 mL/hr (12/19/22 1126)       Allergies  Allergen Reactions   Hydrocodone-Acetaminophen Hives   Vicodin [Hydrocodone-Acetaminophen] Hives   Trelegy Ellipta [Fluticasone-Umeclidin-Vilant] Rash    Changed to Budeson-Glycopyrrol-Formoterol  by provider   Allergies were reviewed by me prior to the procedure  Objective   Body mass index is 29.11 kg/m. Vitals:   12/19/22 1115  BP: (!) 191/68  Pulse: 93  Resp: 18  Temp: (!) 97.5 F (36.4 C)  TempSrc: Temporal  SpO2: 94%  Weight: 76.9 kg  Height: 5\' 4"  (1.626 m)     Physical Exam Vitals and nursing note reviewed.  Constitutional:      General: She is not in acute distress.    Appearance: Normal appearance. She is not ill-appearing, toxic-appearing or diaphoretic.  HENT:     Head: Normocephalic and atraumatic.     Nose: Nose normal.     Mouth/Throat:     Mouth: Mucous membranes are moist.     Pharynx: Oropharynx is clear.  Eyes:     General: No scleral icterus.    Extraocular Movements: Extraocular movements intact.  Cardiovascular:     Rate  and Rhythm: Normal rate and regular rhythm.     Heart sounds: Normal heart sounds. No murmur heard.    No friction rub. No gallop.  Pulmonary:     Effort: Pulmonary effort is normal. No respiratory distress.     Breath sounds: Normal breath sounds. No wheezing, rhonchi or rales.  Abdominal:     General: Bowel sounds are normal. There is no distension.     Palpations: Abdomen is soft.     Tenderness: There is no abdominal tenderness. There is no guarding or rebound.  Musculoskeletal:     Cervical back: Neck supple.     Right lower leg: No edema.     Left lower leg: No edema.  Skin:    General: Skin is warm and dry.     Coloration: Skin is not jaundiced or pale.  Neurological:     General: No focal deficit present.     Mental Status: She is alert and oriented to person, place, and time. Mental status is at baseline.  Psychiatric:        Mood and Affect: Mood normal.        Behavior: Behavior normal.        Thought Content: Thought content normal.        Judgment: Judgment normal.      Assessment:  Misty Adams is a 67 y.o. female  who presents today for Esophagogastroduodenoscopy and Colonoscopy  for symptomatic anemia.  Plan:  Esophagogastroduodenoscopy and Colonoscopy with possible intervention today  Esophagogastroduodenoscopy and Colonoscopy with possible biopsy, control of bleeding, polypectomy, and interventions as necessary has been discussed with the patient/patient representative. Informed consent was obtained from the patient/patient representative after explaining the indication, nature, and risks of the procedure including but not limited to death, bleeding, perforation, missed neoplasm/lesions, cardiorespiratory compromise, and reaction to medications. Opportunity for questions was given and appropriate answers were provided. Patient/patient representative has verbalized understanding is amenable to undergoing the procedure.   Jaynie Collins, DO  [facility]  Portions of the record may have been created with voice recognition software. Occasional wrong-word or 'sound-a-like' substitutions may have occurred due to the inherent limitations of voice recognition software.  Read the chart carefully and recognize, using context, where substitutions may have occurred.

## 2022-12-23 ENCOUNTER — Encounter: Payer: Self-pay | Admitting: Gastroenterology

## 2022-12-24 ENCOUNTER — Inpatient Hospital Stay: Payer: Medicare HMO | Attending: Internal Medicine

## 2022-12-24 VITALS — BP 140/71 | HR 67 | Temp 97.9°F | Resp 18

## 2022-12-24 DIAGNOSIS — Z23 Encounter for immunization: Secondary | ICD-10-CM | POA: Insufficient documentation

## 2022-12-24 DIAGNOSIS — D649 Anemia, unspecified: Secondary | ICD-10-CM

## 2022-12-24 DIAGNOSIS — D509 Iron deficiency anemia, unspecified: Secondary | ICD-10-CM | POA: Diagnosis not present

## 2022-12-24 MED ORDER — SODIUM CHLORIDE 0.9 % IV SOLN
Freq: Once | INTRAVENOUS | Status: AC
  Filled 2022-12-24: qty 250

## 2022-12-24 MED ORDER — SODIUM CHLORIDE 0.9 % IV SOLN
200.0000 mg | Freq: Once | INTRAVENOUS | Status: AC
  Administered 2022-12-24: 200 mg via INTRAVENOUS
  Filled 2022-12-24: qty 200

## 2022-12-24 MED ORDER — INFLUENZA VAC A&B SURF ANT ADJ 0.5 ML IM SUSY
0.5000 mL | PREFILLED_SYRINGE | Freq: Once | INTRAMUSCULAR | Status: AC
  Administered 2022-12-24: 0.5 mL via INTRAMUSCULAR
  Filled 2022-12-24: qty 0.5

## 2022-12-24 MED ORDER — INFLUENZA VAC A&B SA ADJ QUAD 0.5 ML IM PRSY
0.5000 mL | PREFILLED_SYRINGE | Freq: Once | INTRAMUSCULAR | Status: DC
  Filled 2022-12-24: qty 0.5

## 2022-12-24 NOTE — Progress Notes (Signed)
Pt has been educated and understands. Pt declined to stay 30 mins after iron infusion. VSS.   Flu vaccine given.

## 2022-12-25 NOTE — Anesthesia Postprocedure Evaluation (Signed)
Anesthesia Post Note  Patient: MARTEKA EASTLAND  Procedure(s) Performed: COLONOSCOPY WITH PROPOFOL ESOPHAGOGASTRODUODENOSCOPY (EGD) WITH PROPOFOL BIOPSY POLYPECTOMY  Patient location during evaluation: Endoscopy Anesthesia Type: General Level of consciousness: awake and alert Pain management: pain level controlled Vital Signs Assessment: post-procedure vital signs reviewed and stable Respiratory status: spontaneous breathing, nonlabored ventilation, respiratory function stable and patient connected to nasal cannula oxygen Cardiovascular status: blood pressure returned to baseline and stable Postop Assessment: no apparent nausea or vomiting Anesthetic complications: no   No notable events documented.   Last Vitals:  Vitals:   12/19/22 1239 12/19/22 1249  BP: (!) 129/46 (!) 140/52  Pulse:    Resp: 20   Temp: 36.8 C   SpO2: 95%     Last Pain:  Vitals:   12/20/22 1022  TempSrc:   PainSc: 0-No pain                 Lenard Simmer

## 2023-01-12 DIAGNOSIS — R918 Other nonspecific abnormal finding of lung field: Secondary | ICD-10-CM | POA: Diagnosis not present

## 2023-01-12 DIAGNOSIS — G479 Sleep disorder, unspecified: Secondary | ICD-10-CM | POA: Diagnosis not present

## 2023-01-12 DIAGNOSIS — F17218 Nicotine dependence, cigarettes, with other nicotine-induced disorders: Secondary | ICD-10-CM | POA: Diagnosis not present

## 2023-01-12 DIAGNOSIS — J432 Centrilobular emphysema: Secondary | ICD-10-CM | POA: Diagnosis not present

## 2023-01-12 DIAGNOSIS — R0902 Hypoxemia: Secondary | ICD-10-CM | POA: Diagnosis not present

## 2023-01-21 ENCOUNTER — Inpatient Hospital Stay: Payer: Medicare HMO

## 2023-01-21 ENCOUNTER — Inpatient Hospital Stay: Payer: Medicare HMO | Admitting: Internal Medicine

## 2023-01-21 ENCOUNTER — Encounter: Payer: Self-pay | Admitting: Internal Medicine

## 2023-01-21 ENCOUNTER — Inpatient Hospital Stay: Payer: Medicare HMO | Attending: Internal Medicine

## 2023-01-21 VITALS — BP 147/69 | HR 85 | Temp 98.0°F | Ht 64.0 in | Wt 176.2 lb

## 2023-01-21 VITALS — BP 129/54 | HR 72 | Temp 98.6°F | Resp 19

## 2023-01-21 DIAGNOSIS — D649 Anemia, unspecified: Secondary | ICD-10-CM

## 2023-01-21 DIAGNOSIS — K269 Duodenal ulcer, unspecified as acute or chronic, without hemorrhage or perforation: Secondary | ICD-10-CM | POA: Diagnosis not present

## 2023-01-21 DIAGNOSIS — D509 Iron deficiency anemia, unspecified: Secondary | ICD-10-CM | POA: Diagnosis not present

## 2023-01-21 DIAGNOSIS — F32A Depression, unspecified: Secondary | ICD-10-CM | POA: Diagnosis not present

## 2023-01-21 DIAGNOSIS — R911 Solitary pulmonary nodule: Secondary | ICD-10-CM | POA: Diagnosis not present

## 2023-01-21 LAB — CBC WITH DIFFERENTIAL (CANCER CENTER ONLY)
Abs Immature Granulocytes: 0.04 10*3/uL (ref 0.00–0.07)
Basophils Absolute: 0 10*3/uL (ref 0.0–0.1)
Basophils Relative: 1 %
Eosinophils Absolute: 0.3 10*3/uL (ref 0.0–0.5)
Eosinophils Relative: 4 %
HCT: 37.5 % (ref 36.0–46.0)
Hemoglobin: 10.4 g/dL — ABNORMAL LOW (ref 12.0–15.0)
Immature Granulocytes: 1 %
Lymphocytes Relative: 27 %
Lymphs Abs: 2.2 10*3/uL (ref 0.7–4.0)
MCH: 22 pg — ABNORMAL LOW (ref 26.0–34.0)
MCHC: 27.7 g/dL — ABNORMAL LOW (ref 30.0–36.0)
MCV: 79.3 fL — ABNORMAL LOW (ref 80.0–100.0)
Monocytes Absolute: 0.6 10*3/uL (ref 0.1–1.0)
Monocytes Relative: 8 %
Neutro Abs: 5 10*3/uL (ref 1.7–7.7)
Neutrophils Relative %: 59 %
Platelet Count: 324 10*3/uL (ref 150–400)
RBC: 4.73 MIL/uL (ref 3.87–5.11)
RDW: 22.8 % — ABNORMAL HIGH (ref 11.5–15.5)
WBC Count: 8.2 10*3/uL (ref 4.0–10.5)
nRBC: 0 % (ref 0.0–0.2)

## 2023-01-21 LAB — BASIC METABOLIC PANEL - CANCER CENTER ONLY
Anion gap: 10 (ref 5–15)
BUN: 24 mg/dL — ABNORMAL HIGH (ref 8–23)
CO2: 32 mmol/L (ref 22–32)
Calcium: 9.3 mg/dL (ref 8.9–10.3)
Chloride: 95 mmol/L — ABNORMAL LOW (ref 98–111)
Creatinine: 1.38 mg/dL — ABNORMAL HIGH (ref 0.44–1.00)
GFR, Estimated: 41 mL/min — ABNORMAL LOW (ref >60)
Glucose, Bld: 88 mg/dL (ref 70–99)
Potassium: 3.6 mmol/L (ref 3.5–5.1)
Sodium: 137 mmol/L (ref 135–145)

## 2023-01-21 MED ORDER — SODIUM CHLORIDE 0.9 % IV SOLN
Freq: Once | INTRAVENOUS | Status: AC
  Filled 2023-01-21: qty 250

## 2023-01-21 MED ORDER — SODIUM CHLORIDE 0.9 % IV SOLN
200.0000 mg | Freq: Once | INTRAVENOUS | Status: AC
  Administered 2023-01-21: 200 mg via INTRAVENOUS
  Filled 2023-01-21: qty 200

## 2023-01-21 NOTE — Assessment & Plan Note (Addendum)
#   Anemia- Hb-8.3; Ferritin- 6 [PCP- AUG 2024.]- WBC/platelets are normal. Patient is symptomatic.  Likely due to iron deficiency - from etiology GI blood loss.  # Patient has poor tolerance/lack of improvement on oral iron. Currently s/p Venofer; Hb 10.4- proceed with venofer-   #Etiology of iron deficiency:s/p AUG [[facility]]-EGD-duodenal ulcer;  colonoscopy- multiple polyps; JULY 2024- CT scan abdomen pelvis- no acute process.    # CKD- III [PCP]/ DM - on lasix- weight gain- defer to PCP.    # DISPOSITION: # venfoer today # venofer q 2 weeks x 3 more # Follow up 3 months- MD; labs- cbc/bmp; iron studies; ferritin; possible venofer-  Dr.B

## 2023-01-21 NOTE — Progress Notes (Signed)
[facility] CONSULT NOTE  Patient Care Team: Danella Penton, MD as PCP - General (Internal Medicine)  CHIEF COMPLAINTS/PURPOSE OF CONSULTATION: ANEMIA   HEMATOLOGY HISTORY  # ANEMIA[Hb; MCV-platelets- WBC; Iron sat; ferritin;  Kidney: stage III CKD- 30-40s.  CT/US- ;  colo/EGD: 2022;  EGD/colo- awaiting repeating EGD/Dr.Russow-   COPD with ongoing tobacco abuse, abnormal CT chest consistent with interstitial lung disease with recent pulmonary consultation, depression, diabetes, hypertension, PAD  HISTORY OF PRESENTING ILLNESS: Patient ambulating-independently. Alone.   Misty Adams 67 y.o.  female pleasant patient with multiple medical problems and iron deficient anemia is here for follow-up.  Patient denies any blood in stools.  Denies any black loose stool.  Patient s/p EGD colonoscopy.  Patient not taking oral iron because of poor tolerance.   Review of Systems  Constitutional:  Positive for malaise/fatigue. Negative for chills, diaphoresis, fever and weight loss.  HENT:  Negative for nosebleeds and sore throat.   Eyes:  Negative for double vision.  Respiratory:  Negative for cough, hemoptysis, sputum production, shortness of breath and wheezing.   Cardiovascular:  Negative for chest pain, palpitations, orthopnea and leg swelling.  Gastrointestinal:  Negative for abdominal pain, blood in stool, constipation, diarrhea, heartburn, melena, nausea and vomiting.  Genitourinary:  Negative for dysuria, frequency and urgency.  Musculoskeletal:  Negative for back pain and joint pain.  Skin: Negative.  Negative for itching and rash.  Neurological:  Negative for dizziness, tingling, focal weakness, weakness and headaches.  Endo/Heme/Allergies:  Does not bruise/bleed easily.  Psychiatric/Behavioral:  Negative for depression. The patient is not nervous/anxious and does not have insomnia.    MEDICAL HISTORY:  Past Medical History:  Diagnosis Date   Allergy    Anxiety     CHF (congestive heart failure) (HCC)    COPD (chronic obstructive pulmonary disease) (HCC)    Depression    Diabetes mellitus without complication (HCC)    Hyperlipidemia    Hypertension    Hypothyroidism    PAD (peripheral artery disease) (HCC)    Psoriasis    Substance abuse (HCC)    Tobacco abuse     SURGICAL HISTORY: Past Surgical History:  Procedure Laterality Date   ABDOMINAL HYSTERECTOMY     AORTIC ARCH ANGIOGRAPHY N/A 08/06/2017   Procedure: AORTIC ARCH ANGIOGRAPHY;  Surgeon: Fransisco Hertz, MD;  Location: [facility] INVASIVE CV LAB;  Service: Cardiovascular;  Laterality: N/A;   APPENDECTOMY     BIOPSY  12/19/2022   Procedure: BIOPSY;  Surgeon: Jaynie Collins, DO;  Location: [facility] ENDOSCOPY;  Service: Gastroenterology;;   CESAREAN SECTION     COLONOSCOPY     COLONOSCOPY WITH PROPOFOL N/A 12/07/2020   Procedure: COLONOSCOPY WITH PROPOFOL;  Surgeon: Jaynie Collins, DO;  Location: [facility] ENDOSCOPY;  Service: Gastroenterology;  Laterality: N/A;   COLONOSCOPY WITH PROPOFOL N/A 12/19/2022   Procedure: COLONOSCOPY WITH PROPOFOL;  Surgeon: Jaynie Collins, DO;  Location: [facility] ENDOSCOPY;  Service: Gastroenterology;  Laterality: N/A;   dilatation and curettage     DILATION AND CURETTAGE OF UTERUS     ESOPHAGOGASTRODUODENOSCOPY N/A 12/07/2020   Procedure: ESOPHAGOGASTRODUODENOSCOPY (EGD);  Surgeon: Jaynie Collins, DO;  Location: [facility] ENDOSCOPY;  Service: Gastroenterology;  Laterality: N/A;   ESOPHAGOGASTRODUODENOSCOPY (EGD) WITH PROPOFOL N/A 12/19/2022   Procedure: ESOPHAGOGASTRODUODENOSCOPY (EGD) WITH PROPOFOL;  Surgeon: Jaynie Collins, DO;  Location: [facility] ENDOSCOPY;  Service: Gastroenterology;  Laterality: N/A;   KNEE SURGERY     left wrist ganglion cyst removal  OOPHORECTOMY  1994   PERIPHERAL VASCULAR INTERVENTION Left 08/06/2017   Procedure: PERIPHERAL VASCULAR INTERVENTION;  Surgeon: Fransisco Hertz, MD;  Location: [facility] INVASIVE CV LAB;  Service:  Cardiovascular;  Laterality: Left;  subclavian   POLYPECTOMY  12/19/2022   Procedure: POLYPECTOMY;  Surgeon: Jaynie Collins, DO;  Location: [facility] ENDOSCOPY;  Service: Gastroenterology;;   TUBAL LIGATION      SOCIAL HISTORY: Social History   Socioeconomic History   Marital status: Divorced    Spouse name: Not on file   Number of children: Not on file   Years of education: Not on file   Highest education level: Not on file  Occupational History   Not on file  Tobacco Use   Smoking status: Every Day    Current packs/day: 2.00    Average packs/day: 2.0 packs/day for 50.0 years (100.0 ttl pk-yrs)    Types: Cigarettes   Smokeless tobacco: Never  Vaping Use   Vaping status: Former  Substance and Sexual Activity   Alcohol use: Yes   Drug use: Not Currently   Sexual activity: Not Currently  Other Topics Concern   Not on file  Social History Narrative   Not on file   Social Determinants of Health   Financial Resource Strain: Low Risk  (01/12/2023)   Received from [facility]   Overall Financial Resource Strain (CARDIA)    Difficulty of Paying Living Expenses: Not hard at all  Food Insecurity: No Food Insecurity (01/12/2023)   Received from [facility]   Hunger Vital Sign    Worried About Running Out of Food in the Last Year: Never true    Ran Out of Food in the Last Year: Never true  Transportation Needs: No Transportation Needs (01/12/2023)   Received from [facility] - Transportation    In the past 12 months, has lack of transportation kept you from medical appointments or from getting medications?: No    Lack of Transportation (Non-Medical): No  Physical Activity: Not on file  Stress: Not on file  Social Connections: Not on file  Intimate Partner Violence: Not At Risk (12/08/2022)   Humiliation, Afraid, Rape, and Kick questionnaire    Fear of Current or Ex-Partner: No    Emotionally Abused: No     Physically Abused: No    Sexually Abused: No    FAMILY HISTORY: Family History  Problem Relation Age of Onset   Uterine cancer Mother    Stroke Mother    Aneurysm Mother    Heart attack Father    Coronary artery disease Father    Leukemia Father    Depression Father    Diabetes Father    Hyperlipidemia Father    Hypertension Father    Colon polyps Sister    Obesity Sister    Thyroid disease Sister    COPD Sister    Lung cancer Sister    Breast cancer Maternal Aunt    Breast cancer Maternal Aunt     ALLERGIES:  is allergic to hydrocodone-acetaminophen, vicodin [hydrocodone-acetaminophen], and trelegy ellipta [fluticasone-umeclidin-vilant].  MEDICATIONS:  Current Outpatient Medications  Medication Sig Dispense Refill   acetaminophen (TYLENOL) 500 MG tablet Take 1,000 mg by mouth every 6 (six) hours as needed for moderate pain or headache.     albuterol (PROVENTIL HFA;VENTOLIN HFA) 108 (90 Base) MCG/ACT inhaler Inhale 2 puffs into the lungs every 6 (six) hours as needed for wheezing or shortness of breath. 1 Inhaler 0  ALPRAZolam (XANAX) 0.5 MG tablet Take 0.5 mg by mouth 2 (two) times daily as needed for anxiety.      aspirin 81 MG tablet Take 81 mg by mouth at bedtime.      atorvastatin (LIPITOR) 40 MG tablet Take 1 tablet (40 mg total) by mouth at bedtime. 30 tablet 0   BREZTRI AEROSPHERE 160-9-4.8 MCG/ACT AERO Inhale 2 puffs into the lungs 2 (two) times daily.     gabapentin (NEURONTIN) 100 MG capsule Take 200 mg by mouth at bedtime.     guaiFENesin-dextromethorphan (ROBITUSSIN DM) 100-10 MG/5ML syrup Take 10 mLs by mouth every 4 (four) hours as needed for cough. 118 mL 0   levothyroxine (SYNTHROID, LEVOTHROID) 125 MCG tablet Take 125 mcg by mouth daily.      ondansetron (ZOFRAN) 4 MG tablet Take 4 mg by mouth every 4 (four) hours as needed for nausea or vomiting.     OXYGEN Inhale 2 L/L into the lungs at bedtime.     pantoprazole (PROTONIX) 40 MG tablet Take 40 mg by  mouth 2 (two) times daily.     temazepam (RESTORIL) 15 MG capsule Take 15 mg by mouth at bedtime as needed for sleep.     traZODone (DESYREL) 100 MG tablet Take 100 mg by mouth daily as needed for sleep.     venlafaxine XR (EFFEXOR-XR) 75 MG 24 hr capsule Take 150 mg by mouth daily.     No current facility-administered medications for this visit.     PHYSICAL EXAMINATION:   Vitals:   01/21/23 1400 01/21/23 1412  BP: (!) 149/60 (!) 147/69  Pulse: 85   Temp: 98 F (36.7 C)   SpO2: 90%     Filed Weights   01/21/23 1400  Weight: 176 lb 3.2 oz (79.9 kg)     Physical Exam Vitals and nursing note reviewed.  HENT:     Head: Normocephalic and atraumatic.     Mouth/Throat:     Pharynx: Oropharynx is clear.  Eyes:     Extraocular Movements: Extraocular movements intact.     Pupils: Pupils are equal, round, and reactive to light.  Cardiovascular:     Rate and Rhythm: Normal rate and regular rhythm.  Pulmonary:     Comments: Decreased breath sounds bilaterally.  Abdominal:     Palpations: Abdomen is soft.  Musculoskeletal:        General: Normal range of motion.     Cervical back: Normal range of motion.  Skin:    General: Skin is warm.  Neurological:     General: No focal deficit present.     Mental Status: She is alert and oriented to person, place, and time.  Psychiatric:        Behavior: Behavior normal.        Judgment: Judgment normal.      LABORATORY DATA:  I have reviewed the data as listed Lab Results  Component Value Date   WBC 8.2 01/21/2023   HGB 10.4 (L) 01/21/2023   HCT 37.5 01/21/2023   MCV 79.3 (L) 01/21/2023   PLT 324 01/21/2023   Recent Labs    03/18/22 0035 07/14/22 1411 07/18/22 0513 10/16/22 1431 10/16/22 1437 12/08/22 1446 01/21/23 1346  NA 139   < > 136  --   --  134* 137  K 4.4   < > 4.6  --   --  3.1* 3.6  CL 103   < > 111  --   --  92* 95*  CO2 29   < > 20*  --   --  31 32  GLUCOSE 143*   < > 128*  --   --  118* 88  BUN 27*    < > 50*  --   --  25* 24*  CREATININE 1.24*   < > 1.92*   < > 1.90* 1.38* 1.38*  CALCIUM 9.5   < > 8.4*  --   --  9.3 9.3  GFRNONAA 47*   < > 28*  --   --  41* 41*  PROT 7.0  --   --   --   --  7.5  --   ALBUMIN 3.6  --   --   --   --  3.7  --   AST 15  --   --   --   --  14*  --   ALT 13  --   --   --   --  11  --   ALKPHOS 59  --   --   --   --  67  --   BILITOT 0.5  --   --   --   --  0.4  --    < > = values in this interval not displayed.     No results found.  ASSESSMENT & PLAN:   Symptomatic anemia # Anemia- Hb-8.3; Ferritin- 6 [PCP- AUG 2024.]- WBC/platelets are normal. Patient is symptomatic.  Likely due to iron deficiency - from etiology GI blood loss.  # Patient has poor tolerance/lack of improvement on oral iron. Currently s/p Venofer; Hb 10.4- proceed with venofer-   #Etiology of iron deficiency:s/p AUG [[facility]]-EGD-duodenal ulcer;  colonoscopy- multiple polyps; JULY 2024- CT scan abdomen pelvis- no acute process.    # CKD- III [PCP]/ DM - on lasix- weight gain- defer to PCP.    # DISPOSITION: # venfoer today # venofer q 2 weeks x 3 more # Follow up 3 months- MD; labs- cbc/bmp; iron studies; ferritin; possible venofer-  Dr.B  All questions were answered. The patient knows to call the clinic with any problems, questions or concerns.   Earna Coder, MD 01/21/2023 2:52 PM

## 2023-01-21 NOTE — Progress Notes (Signed)
Fatigue/weakness: better Dyspena: yes Light headedness: yes Blood in stool: no  Had an upper and lower endo. Done in Sept 2024, dx with an ulcer.  On O2-2L mostly at bedtime.  Has trouble sleeping, takes trazodone or Restoril prn.  Appetite 75%, no supplements.  C/o swelling in both feet x1 week.

## 2023-01-31 DIAGNOSIS — G4733 Obstructive sleep apnea (adult) (pediatric): Secondary | ICD-10-CM | POA: Diagnosis not present

## 2023-02-03 DIAGNOSIS — Z1231 Encounter for screening mammogram for malignant neoplasm of breast: Secondary | ICD-10-CM

## 2023-02-04 ENCOUNTER — Inpatient Hospital Stay: Payer: Medicare HMO

## 2023-02-04 VITALS — BP 137/49 | HR 65 | Temp 97.1°F | Resp 18

## 2023-02-04 DIAGNOSIS — D649 Anemia, unspecified: Secondary | ICD-10-CM

## 2023-02-04 DIAGNOSIS — D509 Iron deficiency anemia, unspecified: Secondary | ICD-10-CM | POA: Diagnosis not present

## 2023-02-04 MED ORDER — SODIUM CHLORIDE 0.9 % IV SOLN
Freq: Once | INTRAVENOUS | Status: AC
  Filled 2023-02-04: qty 250

## 2023-02-04 MED ORDER — SODIUM CHLORIDE 0.9 % IV SOLN
200.0000 mg | Freq: Once | INTRAVENOUS | Status: AC
  Administered 2023-02-04: 200 mg via INTRAVENOUS
  Filled 2023-02-04: qty 200

## 2023-02-04 NOTE — Patient Instructions (Signed)
Iron Sucrose Injection What is this medication? IRON SUCROSE (EYE ern SOO krose) treats low levels of iron (iron deficiency anemia) in people with kidney disease. Iron is a mineral that plays an important role in making red blood cells, which carry oxygen from your lungs to the rest of your body. This medicine may be used for other purposes; ask your health care provider or pharmacist if you have questions. COMMON BRAND NAME(S): Venofer What should I tell my care team before I take this medication? They need to know if you have any of these conditions: Anemia not caused by low iron levels Heart disease High levels of iron in the blood Kidney disease Liver disease An unusual or allergic reaction to iron, other medications, foods, dyes, or preservatives Pregnant or trying to get pregnant Breastfeeding How should I use this medication? This medication is for infusion into a vein. It is given in a hospital or clinic setting. Talk to your care team about the use of this medication in children. While this medication may be prescribed for children as young as 2 years for selected conditions, precautions do apply. Overdosage: If you think you have taken too much of this medicine contact a poison control center or emergency room at once. NOTE: This medicine is only for you. Do not share this medicine with others. What if I miss a dose? Keep appointments for follow-up doses. It is important not to miss your dose. Call your care team if you are unable to keep an appointment. What may interact with this medication? Do not take this medication with any of the following: Deferoxamine Dimercaprol Other iron products This medication may also interact with the following: Chloramphenicol Deferasirox This list may not describe all possible interactions. Give your health care provider a list of all the medicines, herbs, non-prescription drugs, or dietary supplements you use. Also tell them if you smoke,  drink alcohol, or use illegal drugs. Some items may interact with your medicine. What should I watch for while using this medication? Visit your care team regularly. Tell your care team if your symptoms do not start to get better or if they get worse. You may need blood work done while you are taking this medication. You may need to follow a special diet. Talk to your care team. Foods that contain iron include: whole grains/cereals, dried fruits, beans, or peas, leafy green vegetables, and organ meats (liver, kidney). What side effects may I notice from receiving this medication? Side effects that you should report to your care team as soon as possible: Allergic reactions--skin rash, itching, hives, swelling of the face, lips, tongue, or throat Low blood pressure--dizziness, feeling faint or lightheaded, blurry vision Shortness of breath Side effects that usually do not require medical attention (report to your care team if they continue or are bothersome): Flushing Headache Joint pain Muscle pain Nausea Pain, redness, or irritation at injection site This list may not describe all possible side effects. Call your doctor for medical advice about side effects. You may report side effects to FDA at 1-800-FDA-1088. Where should I keep my medication? This medication is given in a hospital or clinic. It will not be stored at home. NOTE: This sheet is a summary. It may not cover all possible information. If you have questions about this medicine, talk to your doctor, pharmacist, or health care provider.  2024 Elsevier/Gold Standard (2022-09-12 00:00:00)

## 2023-02-18 ENCOUNTER — Inpatient Hospital Stay: Payer: Medicare HMO

## 2023-02-18 VITALS — BP 130/50 | HR 62 | Temp 95.2°F | Resp 18

## 2023-02-18 DIAGNOSIS — D649 Anemia, unspecified: Secondary | ICD-10-CM

## 2023-02-18 DIAGNOSIS — D509 Iron deficiency anemia, unspecified: Secondary | ICD-10-CM | POA: Diagnosis not present

## 2023-02-18 MED ORDER — SODIUM CHLORIDE 0.9% FLUSH
10.0000 mL | Freq: Once | INTRAVENOUS | Status: AC | PRN
  Administered 2023-02-18: 10 mL
  Filled 2023-02-18: qty 10

## 2023-02-18 MED ORDER — IRON SUCROSE 20 MG/ML IV SOLN
200.0000 mg | Freq: Once | INTRAVENOUS | Status: AC
  Administered 2023-02-18: 200 mg via INTRAVENOUS
  Filled 2023-02-18: qty 10

## 2023-03-04 ENCOUNTER — Inpatient Hospital Stay: Payer: Medicare HMO

## 2023-03-08 ENCOUNTER — Other Ambulatory Visit: Payer: Self-pay

## 2023-03-08 ENCOUNTER — Inpatient Hospital Stay
Admission: EM | Admit: 2023-03-08 | Discharge: 2023-03-10 | DRG: 291 | Disposition: A | Discharge status: Home / Self Care | Payer: Medicare HMO | Attending: Internal Medicine | Admitting: Internal Medicine

## 2023-03-08 ENCOUNTER — Emergency Department: Payer: Medicare HMO

## 2023-03-08 DIAGNOSIS — J9621 Acute and chronic respiratory failure with hypoxia: Secondary | ICD-10-CM | POA: Diagnosis not present

## 2023-03-08 DIAGNOSIS — N179 Acute kidney failure, unspecified: Secondary | ICD-10-CM | POA: Insufficient documentation

## 2023-03-08 DIAGNOSIS — N1832 Chronic kidney disease, stage 3b: Secondary | ICD-10-CM | POA: Diagnosis present

## 2023-03-08 DIAGNOSIS — R011 Cardiac murmur, unspecified: Secondary | ICD-10-CM | POA: Diagnosis present

## 2023-03-08 DIAGNOSIS — F32A Depression, unspecified: Secondary | ICD-10-CM | POA: Diagnosis present

## 2023-03-08 DIAGNOSIS — E876 Hypokalemia: Secondary | ICD-10-CM | POA: Diagnosis not present

## 2023-03-08 DIAGNOSIS — R0603 Acute respiratory distress: Secondary | ICD-10-CM

## 2023-03-08 DIAGNOSIS — K219 Gastro-esophageal reflux disease without esophagitis: Secondary | ICD-10-CM | POA: Diagnosis present

## 2023-03-08 DIAGNOSIS — I10 Essential (primary) hypertension: Secondary | ICD-10-CM | POA: Diagnosis not present

## 2023-03-08 DIAGNOSIS — Z79890 Hormone replacement therapy (postmenopausal): Secondary | ICD-10-CM

## 2023-03-08 DIAGNOSIS — J9601 Acute respiratory failure with hypoxia: Secondary | ICD-10-CM | POA: Diagnosis not present

## 2023-03-08 DIAGNOSIS — E785 Hyperlipidemia, unspecified: Secondary | ICD-10-CM | POA: Diagnosis not present

## 2023-03-08 DIAGNOSIS — D631 Anemia in chronic kidney disease: Secondary | ICD-10-CM | POA: Diagnosis present

## 2023-03-08 DIAGNOSIS — Z9981 Dependence on supplemental oxygen: Secondary | ICD-10-CM

## 2023-03-08 DIAGNOSIS — F419 Anxiety disorder, unspecified: Secondary | ICD-10-CM | POA: Diagnosis present

## 2023-03-08 DIAGNOSIS — Z8349 Family history of other endocrine, nutritional and metabolic diseases: Secondary | ICD-10-CM

## 2023-03-08 DIAGNOSIS — J449 Chronic obstructive pulmonary disease, unspecified: Secondary | ICD-10-CM | POA: Diagnosis present

## 2023-03-08 DIAGNOSIS — E1122 Type 2 diabetes mellitus with diabetic chronic kidney disease: Secondary | ICD-10-CM | POA: Diagnosis not present

## 2023-03-08 DIAGNOSIS — Z87440 Personal history of urinary (tract) infections: Secondary | ICD-10-CM

## 2023-03-08 DIAGNOSIS — J849 Interstitial pulmonary disease, unspecified: Secondary | ICD-10-CM | POA: Diagnosis not present

## 2023-03-08 DIAGNOSIS — Z803 Family history of malignant neoplasm of breast: Secondary | ICD-10-CM

## 2023-03-08 DIAGNOSIS — Z79899 Other long term (current) drug therapy: Secondary | ICD-10-CM

## 2023-03-08 DIAGNOSIS — E1151 Type 2 diabetes mellitus with diabetic peripheral angiopathy without gangrene: Secondary | ICD-10-CM | POA: Diagnosis present

## 2023-03-08 DIAGNOSIS — N189 Chronic kidney disease, unspecified: Secondary | ICD-10-CM | POA: Insufficient documentation

## 2023-03-08 DIAGNOSIS — R0602 Shortness of breath: Secondary | ICD-10-CM | POA: Diagnosis not present

## 2023-03-08 DIAGNOSIS — I130 Hypertensive heart and chronic kidney disease with heart failure and stage 1 through stage 4 chronic kidney disease, or unspecified chronic kidney disease: Secondary | ICD-10-CM | POA: Diagnosis not present

## 2023-03-08 DIAGNOSIS — I2489 Other forms of acute ischemic heart disease: Secondary | ICD-10-CM | POA: Diagnosis not present

## 2023-03-08 DIAGNOSIS — Z885 Allergy status to narcotic agent status: Secondary | ICD-10-CM

## 2023-03-08 DIAGNOSIS — Z825 Family history of asthma and other chronic lower respiratory diseases: Secondary | ICD-10-CM

## 2023-03-08 DIAGNOSIS — Z90710 Acquired absence of both cervix and uterus: Secondary | ICD-10-CM

## 2023-03-08 DIAGNOSIS — I5033 Acute on chronic diastolic (congestive) heart failure: Secondary | ICD-10-CM | POA: Diagnosis not present

## 2023-03-08 DIAGNOSIS — Z888 Allergy status to other drugs, medicaments and biological substances status: Secondary | ICD-10-CM

## 2023-03-08 DIAGNOSIS — Z833 Family history of diabetes mellitus: Secondary | ICD-10-CM

## 2023-03-08 DIAGNOSIS — I160 Hypertensive urgency: Secondary | ICD-10-CM | POA: Diagnosis present

## 2023-03-08 DIAGNOSIS — E039 Hypothyroidism, unspecified: Secondary | ICD-10-CM | POA: Diagnosis not present

## 2023-03-08 DIAGNOSIS — Z7982 Long term (current) use of aspirin: Secondary | ICD-10-CM

## 2023-03-08 DIAGNOSIS — I110 Hypertensive heart disease with heart failure: Secondary | ICD-10-CM | POA: Diagnosis not present

## 2023-03-08 DIAGNOSIS — Z818 Family history of other mental and behavioral disorders: Secondary | ICD-10-CM

## 2023-03-08 DIAGNOSIS — L409 Psoriasis, unspecified: Secondary | ICD-10-CM | POA: Diagnosis present

## 2023-03-08 DIAGNOSIS — Z1152 Encounter for screening for COVID-19: Secondary | ICD-10-CM | POA: Diagnosis not present

## 2023-03-08 DIAGNOSIS — I5032 Chronic diastolic (congestive) heart failure: Secondary | ICD-10-CM | POA: Insufficient documentation

## 2023-03-08 DIAGNOSIS — Z8249 Family history of ischemic heart disease and other diseases of the circulatory system: Secondary | ICD-10-CM

## 2023-03-08 DIAGNOSIS — Z823 Family history of stroke: Secondary | ICD-10-CM

## 2023-03-08 DIAGNOSIS — F17210 Nicotine dependence, cigarettes, uncomplicated: Secondary | ICD-10-CM | POA: Diagnosis present

## 2023-03-08 DIAGNOSIS — R0989 Other specified symptoms and signs involving the circulatory and respiratory systems: Secondary | ICD-10-CM | POA: Diagnosis not present

## 2023-03-08 DIAGNOSIS — Z8049 Family history of malignant neoplasm of other genital organs: Secondary | ICD-10-CM

## 2023-03-08 DIAGNOSIS — R7989 Other specified abnormal findings of blood chemistry: Secondary | ICD-10-CM | POA: Diagnosis not present

## 2023-03-08 DIAGNOSIS — J811 Chronic pulmonary edema: Secondary | ICD-10-CM | POA: Diagnosis not present

## 2023-03-08 DIAGNOSIS — I5031 Acute diastolic (congestive) heart failure: Secondary | ICD-10-CM | POA: Diagnosis not present

## 2023-03-08 DIAGNOSIS — Z83438 Family history of other disorder of lipoprotein metabolism and other lipidemia: Secondary | ICD-10-CM

## 2023-03-08 DIAGNOSIS — I509 Heart failure, unspecified: Principal | ICD-10-CM

## 2023-03-08 DIAGNOSIS — Z801 Family history of malignant neoplasm of trachea, bronchus and lung: Secondary | ICD-10-CM

## 2023-03-08 DIAGNOSIS — Z806 Family history of leukemia: Secondary | ICD-10-CM

## 2023-03-08 LAB — CBC WITH DIFFERENTIAL/PLATELET
Abs Immature Granulocytes: 0.04 10*3/uL (ref 0.00–0.07)
Basophils Absolute: 0 10*3/uL (ref 0.0–0.1)
Basophils Relative: 0 %
Eosinophils Absolute: 0.1 10*3/uL (ref 0.0–0.5)
Eosinophils Relative: 1 %
HCT: 37.3 % (ref 36.0–46.0)
Hemoglobin: 10.9 g/dL — ABNORMAL LOW (ref 12.0–15.0)
Immature Granulocytes: 0 %
Lymphocytes Relative: 11 %
Lymphs Abs: 1 10*3/uL (ref 0.7–4.0)
MCH: 24.6 pg — ABNORMAL LOW (ref 26.0–34.0)
MCHC: 29.2 g/dL — ABNORMAL LOW (ref 30.0–36.0)
MCV: 84.2 fL (ref 80.0–100.0)
Monocytes Absolute: 0.5 10*3/uL (ref 0.1–1.0)
Monocytes Relative: 5 %
Neutro Abs: 7.5 10*3/uL (ref 1.7–7.7)
Neutrophils Relative %: 83 %
Platelets: 229 10*3/uL (ref 150–400)
RBC: 4.43 MIL/uL (ref 3.87–5.11)
RDW: 20.4 % — ABNORMAL HIGH (ref 11.5–15.5)
WBC: 9.2 10*3/uL (ref 4.0–10.5)
nRBC: 0 % (ref 0.0–0.2)

## 2023-03-08 MED ORDER — FUROSEMIDE 10 MG/ML IJ SOLN
80.0000 mg | Freq: Once | INTRAMUSCULAR | Status: AC
  Administered 2023-03-08: 80 mg via INTRAVENOUS
  Filled 2023-03-08: qty 8

## 2023-03-08 MED ORDER — NITROGLYCERIN 0.4 MG SL SUBL
0.4000 mg | SUBLINGUAL_TABLET | SUBLINGUAL | Status: DC | PRN
  Administered 2023-03-08: 0.4 mg via SUBLINGUAL
  Filled 2023-03-08: qty 1

## 2023-03-08 NOTE — ED Provider Notes (Signed)
[facility] Provider Note    Event Date/Time   First MD Initiated Contact with Patient 03/08/23 2320     (approximate)   History   Respiratory Distress (Hx of CHF/ COPD. Pt recently returned from cruise, woke up with [facility], Sats on baseline 3L O2 was 60%, Noted fluid on lungs and audible crackles. 92% on 8L, initial BP 218/82 and increased swelling to BLE)   HPI  Misty Adams is a 67 y.o. female   Past medical history of CHF and COPD who presents to the Emergency Department with shortness of breath, orthopnea and exertional dyspnea as well as swelling in her legs.  She was on a cruise recently.  Compliant with medications.  No chest pain.  Mild cough, no sputum production, no fever.  Independent Historian contributed to assessment above: Her son and EMS providers give collateral information past medical history as above, EMS reports oxygenation 60% on room air at home.  External Medical Documents Reviewed: dc summary from march 2024 for sepsis/uti      Physical Exam   Triage Vital Signs: ED Triage Vitals  Encounter Vitals Group     BP      Systolic BP Percentile      Diastolic BP Percentile      Pulse      Resp      Temp      Temp src      SpO2      Weight      Height      Head Circumference      Peak Flow      Pain Score      Pain Loc      Pain Education      Exclude from Growth Chart     Most recent vital signs: Vitals:   03/08/23 2322 03/08/23 2335  BP:  (!) 175/68  Pulse:  88  Resp: (!) 25 (!) 22  SpO2:  93%    General: Awake, no distress.  CV:  Good peripheral perfusion.  Resp:  Tachypneic with rales at bases. Abd:  No distention.  Other:  Speaking in short phrases.  Edema bilateral legs   ED Results / Procedures / Treatments   Labs (all labs ordered are listed, but only abnormal results are displayed) Labs Reviewed  BRAIN NATRIURETIC PEPTIDE - Abnormal; Notable for the following components:      Result Value    B Natriuretic Peptide 710.1 (*)    All other components within normal limits  COMPREHENSIVE METABOLIC PANEL - Abnormal; Notable for the following components:   Glucose, Bld 147 (*)    BUN 50 (*)    Creatinine, Ser 2.01 (*)    Albumin 3.4 (*)    GFR, Estimated 26 (*)    All other components within normal limits  CBC WITH DIFFERENTIAL/PLATELET - Abnormal; Notable for the following components:   Hemoglobin 10.9 (*)    MCH 24.6 (*)    MCHC 29.2 (*)    RDW 20.4 (*)    All other components within normal limits  TROPONIN I (HIGH SENSITIVITY) - Abnormal; Notable for the following components:   Troponin I (High Sensitivity) 18 (*)    All other components within normal limits  RESP PANEL BY RT-PCR (RSV, FLU A&B, COVID)  [id number]     I ordered and reviewed the above labs they are notable for BNP elevated at 700, initial troponin is 18.  EKG  ED ECG REPORT I, Bruna Potter  Mod[place] Charon, the attending physician, personally viewed and interpreted this ECG.   Date: 03/08/2023  EKG Time: 2326  Rate: 97  Rhythm: sinus  Axis: nl  Intervals:none  ST&T Change: no stemi    RADIOLOGY I independently reviewed and interpreted chest x-ray see bilateral opacities concerning for fluid  I also reviewed radiologist's formal read.   PROCEDURES:  Critical Care performed: Yes, see critical care procedure note(s)  .Critical Care  Performed by: Pilar Jarvis, MD Authorized by: Pilar Jarvis, MD   Critical care provider statement:    Critical care time (minutes):  30   Critical care was time spent personally by me on the following activities:  Development of treatment plan with patient or surrogate, discussions with consultants, evaluation of patient's response to treatment, examination of patient, ordering and review of laboratory studies, ordering and review of radiographic studies, ordering and performing treatments and interventions, pulse oximetry, re-evaluation of patient's condition and review of old  charts    MEDICATIONS ORDERED IN ED: Medications  nitroGLYCERIN (NITROSTAT) SL tablet 0.4 mg (0.4 mg Sublingual Given 03/08/23 2337)  furosemide (LASIX) injection 80 mg (80 mg Intravenous Given 03/08/23 2337)    External physician / consultants:  I spoke with hospitalist for admission and regarding care plan for this patient.   IMPRESSION / MDM / ASSESSMENT AND PLAN / ED COURSE  I reviewed the triage vital signs and the nursing notes.                                Patient's presentation is most consistent with acute presentation with potential threat to life or bodily function.  Differential diagnosis includes, but is not limited to, CHF exacerbation, COPD exacerbation, respiratory infection, ACS, PE   The patient is on the cardiac monitor to evaluate for evidence of arrhythmia and/or significant heart rate changes.  MDM: Most likely CHF exacerbation with a history of the same, evidence of fluid overload with edema and rales on auscultation, elevated proBNP and chest x-ray revealing the same.  Given Lasix, put on BiPAP, Nitropaste, with relief of symptoms.  Admission.         FINAL CLINICAL IMPRESSION(S) / ED DIAGNOSES   Final diagnoses:  Acute congestive heart failure, unspecified heart failure type (HCC)  Respiratory distress  Acute hypoxemic respiratory failure (HCC)     Rx / DC Orders   ED Discharge Orders     None        Note:  This document was prepared using Dragon voice recognition software and may include unintentional dictation errors.    Pilar Jarvis, MD 03/09/23 [phone]

## 2023-03-08 NOTE — ED Notes (Signed)
CRITICAL PT/RES DISTRESS, RM #3, RESPIRATORY WAS CALLED AT 11:14PM

## 2023-03-09 ENCOUNTER — Inpatient Hospital Stay (HOSPITAL_COMMUNITY)
Admit: 2023-03-09 | Discharge: 2023-03-09 | Disposition: A | Discharge status: Home / Self Care | Payer: Medicare HMO | Attending: Family Medicine | Admitting: Family Medicine

## 2023-03-09 ENCOUNTER — Encounter: Payer: Self-pay | Admitting: Family Medicine

## 2023-03-09 DIAGNOSIS — N189 Chronic kidney disease, unspecified: Secondary | ICD-10-CM | POA: Insufficient documentation

## 2023-03-09 DIAGNOSIS — J849 Interstitial pulmonary disease, unspecified: Secondary | ICD-10-CM | POA: Diagnosis present

## 2023-03-09 DIAGNOSIS — I5032 Chronic diastolic (congestive) heart failure: Secondary | ICD-10-CM | POA: Insufficient documentation

## 2023-03-09 DIAGNOSIS — I5033 Acute on chronic diastolic (congestive) heart failure: Secondary | ICD-10-CM | POA: Diagnosis not present

## 2023-03-09 DIAGNOSIS — J9601 Acute respiratory failure with hypoxia: Secondary | ICD-10-CM | POA: Diagnosis present

## 2023-03-09 DIAGNOSIS — R0603 Acute respiratory distress: Secondary | ICD-10-CM | POA: Diagnosis present

## 2023-03-09 DIAGNOSIS — N179 Acute kidney failure, unspecified: Secondary | ICD-10-CM

## 2023-03-09 DIAGNOSIS — N1832 Chronic kidney disease, stage 3b: Secondary | ICD-10-CM | POA: Diagnosis present

## 2023-03-09 DIAGNOSIS — R7989 Other specified abnormal findings of blood chemistry: Secondary | ICD-10-CM | POA: Diagnosis not present

## 2023-03-09 DIAGNOSIS — K219 Gastro-esophageal reflux disease without esophagitis: Secondary | ICD-10-CM | POA: Insufficient documentation

## 2023-03-09 DIAGNOSIS — E785 Hyperlipidemia, unspecified: Secondary | ICD-10-CM | POA: Diagnosis not present

## 2023-03-09 DIAGNOSIS — I2489 Other forms of acute ischemic heart disease: Secondary | ICD-10-CM | POA: Diagnosis present

## 2023-03-09 DIAGNOSIS — Z9981 Dependence on supplemental oxygen: Secondary | ICD-10-CM | POA: Diagnosis not present

## 2023-03-09 DIAGNOSIS — F32A Depression, unspecified: Secondary | ICD-10-CM | POA: Diagnosis present

## 2023-03-09 DIAGNOSIS — Z1152 Encounter for screening for COVID-19: Secondary | ICD-10-CM | POA: Diagnosis not present

## 2023-03-09 DIAGNOSIS — J9621 Acute and chronic respiratory failure with hypoxia: Secondary | ICD-10-CM | POA: Diagnosis present

## 2023-03-09 DIAGNOSIS — I160 Hypertensive urgency: Secondary | ICD-10-CM | POA: Diagnosis present

## 2023-03-09 DIAGNOSIS — I5031 Acute diastolic (congestive) heart failure: Secondary | ICD-10-CM

## 2023-03-09 DIAGNOSIS — F419 Anxiety disorder, unspecified: Secondary | ICD-10-CM | POA: Diagnosis present

## 2023-03-09 DIAGNOSIS — Z79890 Hormone replacement therapy (postmenopausal): Secondary | ICD-10-CM | POA: Diagnosis not present

## 2023-03-09 DIAGNOSIS — I10 Essential (primary) hypertension: Secondary | ICD-10-CM | POA: Diagnosis not present

## 2023-03-09 DIAGNOSIS — E1151 Type 2 diabetes mellitus with diabetic peripheral angiopathy without gangrene: Secondary | ICD-10-CM | POA: Diagnosis present

## 2023-03-09 DIAGNOSIS — I130 Hypertensive heart and chronic kidney disease with heart failure and stage 1 through stage 4 chronic kidney disease, or unspecified chronic kidney disease: Secondary | ICD-10-CM | POA: Diagnosis present

## 2023-03-09 DIAGNOSIS — L409 Psoriasis, unspecified: Secondary | ICD-10-CM | POA: Diagnosis present

## 2023-03-09 DIAGNOSIS — E039 Hypothyroidism, unspecified: Secondary | ICD-10-CM | POA: Diagnosis not present

## 2023-03-09 DIAGNOSIS — E876 Hypokalemia: Secondary | ICD-10-CM | POA: Diagnosis not present

## 2023-03-09 DIAGNOSIS — F17210 Nicotine dependence, cigarettes, uncomplicated: Secondary | ICD-10-CM | POA: Diagnosis present

## 2023-03-09 DIAGNOSIS — E1122 Type 2 diabetes mellitus with diabetic chronic kidney disease: Secondary | ICD-10-CM | POA: Diagnosis present

## 2023-03-09 DIAGNOSIS — D631 Anemia in chronic kidney disease: Secondary | ICD-10-CM | POA: Diagnosis present

## 2023-03-09 DIAGNOSIS — R011 Cardiac murmur, unspecified: Secondary | ICD-10-CM | POA: Diagnosis present

## 2023-03-09 DIAGNOSIS — J449 Chronic obstructive pulmonary disease, unspecified: Secondary | ICD-10-CM | POA: Diagnosis present

## 2023-03-09 LAB — COMPREHENSIVE METABOLIC PANEL
ALT: 21 U/L (ref 0–44)
AST: 15 U/L (ref 15–41)
Albumin: 3.4 g/dL — ABNORMAL LOW (ref 3.5–5.0)
Alkaline Phosphatase: 80 U/L (ref 38–126)
Anion gap: 10 (ref 5–15)
BUN: 50 mg/dL — ABNORMAL HIGH (ref 8–23)
CO2: 26 mmol/L (ref 22–32)
Calcium: 8.9 mg/dL (ref 8.9–10.3)
Chloride: 103 mmol/L (ref 98–111)
Creatinine, Ser: 2.01 mg/dL — ABNORMAL HIGH (ref 0.44–1.00)
GFR, Estimated: 26 mL/min — ABNORMAL LOW (ref >60)
Glucose, Bld: 147 mg/dL — ABNORMAL HIGH (ref 70–99)
Potassium: 4.3 mmol/L (ref 3.5–5.1)
Sodium: 139 mmol/L (ref 135–145)
Total Bilirubin: 0.9 mg/dL (ref <1.2)
Total Protein: 7.2 g/dL (ref 6.5–8.1)

## 2023-03-09 LAB — ECHOCARDIOGRAM COMPLETE
AR max vel: 2.06 cm2
AV Area VTI: 2.57 cm2
AV Area mean vel: 2.26 cm2
AV Mean grad: 9.7 mm[Hg]
AV Peak grad: 19 mm[Hg]
Ao pk vel: 2.18 m/s
Area-P 1/2: 2.76 cm2
Height: 64 in
MV VTI: 1.89 cm2
S' Lateral: 3 cm

## 2023-03-09 LAB — CBC
HCT: 34.2 % — ABNORMAL LOW (ref 36.0–46.0)
Hemoglobin: 10.1 g/dL — ABNORMAL LOW (ref 12.0–15.0)
MCH: 24.5 pg — ABNORMAL LOW (ref 26.0–34.0)
MCHC: 29.5 g/dL — ABNORMAL LOW (ref 30.0–36.0)
MCV: 83 fL (ref 80.0–100.0)
Platelets: 227 10*3/uL (ref 150–400)
RBC: 4.12 MIL/uL (ref 3.87–5.11)
RDW: 20.3 % — ABNORMAL HIGH (ref 11.5–15.5)
WBC: 6.8 10*3/uL (ref 4.0–10.5)
nRBC: 0 % (ref 0.0–0.2)

## 2023-03-09 LAB — BASIC METABOLIC PANEL
Anion gap: 12 (ref 5–15)
BUN: 50 mg/dL — ABNORMAL HIGH (ref 8–23)
CO2: 26 mmol/L (ref 22–32)
Calcium: 8.8 mg/dL — ABNORMAL LOW (ref 8.9–10.3)
Chloride: 101 mmol/L (ref 98–111)
Creatinine, Ser: 1.91 mg/dL — ABNORMAL HIGH (ref 0.44–1.00)
GFR, Estimated: 28 mL/min — ABNORMAL LOW (ref >60)
Glucose, Bld: 103 mg/dL — ABNORMAL HIGH (ref 70–99)
Potassium: 4.4 mmol/L (ref 3.5–5.1)
Sodium: 139 mmol/L (ref 135–145)

## 2023-03-09 LAB — RESP PANEL BY RT-PCR (RSV, FLU A&B, COVID)  RVPGX2
Influenza A by PCR: NEGATIVE
Influenza B by PCR: NEGATIVE
Resp Syncytial Virus by PCR: NEGATIVE
SARS Coronavirus 2 by RT PCR: NEGATIVE

## 2023-03-09 LAB — TROPONIN I (HIGH SENSITIVITY)
Troponin I (High Sensitivity): 18 ng/L — ABNORMAL HIGH (ref <18)
Troponin I (High Sensitivity): 38 ng/L — ABNORMAL HIGH (ref <18)

## 2023-03-09 LAB — BRAIN NATRIURETIC PEPTIDE: B Natriuretic Peptide: 710.1 pg/mL — ABNORMAL HIGH (ref 0.0–100.0)

## 2023-03-09 MED ORDER — MAGNESIUM HYDROXIDE 400 MG/5ML PO SUSP: 30.0000 mL | Freq: Every day | ORAL | Status: DC | PRN

## 2023-03-09 MED ORDER — FUROSEMIDE 10 MG/ML IJ SOLN
80.0000 mg | Freq: Two times a day (BID) | INTRAMUSCULAR | Status: DC
Start: 2023-03-09 — End: 2023-03-10
  Administered 2023-03-09 – 2023-03-10 (×2): 80 mg via INTRAVENOUS
  Filled 2023-03-09 (×2): qty 8

## 2023-03-09 MED ORDER — ALBUTEROL SULFATE HFA 108 (90 BASE) MCG/ACT IN AERS: 2.0000 | INHALATION_SPRAY | Freq: Four times a day (QID) | RESPIRATORY_TRACT | Status: DC | PRN

## 2023-03-09 MED ORDER — LEVOTHYROXINE SODIUM 25 MCG PO TABS
125.0000 ug | ORAL_TABLET | Freq: Every day | ORAL | Status: DC
  Administered 2023-03-09 – 2023-03-10 (×2): 125 ug via ORAL
  Filled 2023-03-09: qty 3
  Filled 2023-03-09: qty 1

## 2023-03-09 MED ORDER — ENOXAPARIN SODIUM 40 MG/0.4ML IJ SOSY
40.0000 mg | PREFILLED_SYRINGE | INTRAMUSCULAR | Status: DC
  Administered 2023-03-09 – 2023-03-10 (×2): 40 mg via SUBCUTANEOUS
  Filled 2023-03-09 (×2): qty 0.4

## 2023-03-09 MED ORDER — GUAIFENESIN-DM 100-10 MG/5ML PO SYRP: 10.0000 mL | ORAL_SOLUTION | ORAL | Status: DC | PRN

## 2023-03-09 MED ORDER — ALBUTEROL SULFATE (2.5 MG/3ML) 0.083% IN NEBU: 2.5000 mg | INHALATION_SOLUTION | Freq: Four times a day (QID) | RESPIRATORY_TRACT | Status: DC | PRN

## 2023-03-09 MED ORDER — GABAPENTIN 100 MG PO CAPS
200.0000 mg | ORAL_CAPSULE | Freq: Every day | ORAL | Status: DC
  Administered 2023-03-09: 200 mg via ORAL
  Filled 2023-03-09: qty 2

## 2023-03-09 MED ORDER — VENLAFAXINE HCL ER 150 MG PO CP24
150.0000 mg | ORAL_CAPSULE | Freq: Every day | ORAL | Status: DC
  Administered 2023-03-09 – 2023-03-10 (×2): 150 mg via ORAL
  Filled 2023-03-09 (×2): qty 1

## 2023-03-09 MED ORDER — ONDANSETRON HCL 4 MG PO TABS: 4.0000 mg | ORAL_TABLET | ORAL | Status: DC | PRN

## 2023-03-09 MED ORDER — ASPIRIN 81 MG PO TBEC
81.0000 mg | DELAYED_RELEASE_TABLET | Freq: Every day | ORAL | Status: DC
  Administered 2023-03-09: 81 mg via ORAL
  Filled 2023-03-09: qty 1

## 2023-03-09 MED ORDER — ACETAMINOPHEN 650 MG RE SUPP
650.0000 mg | Freq: Four times a day (QID) | RECTAL | Status: DC | PRN
Start: 2023-03-09 — End: 2023-03-10

## 2023-03-09 MED ORDER — TRAZODONE HCL 100 MG PO TABS
100.0000 mg | ORAL_TABLET | Freq: Every day | ORAL | Status: DC | PRN
  Administered 2023-03-09: 100 mg via ORAL
  Filled 2023-03-09: qty 1

## 2023-03-09 MED ORDER — PANTOPRAZOLE SODIUM 40 MG PO TBEC
40.0000 mg | DELAYED_RELEASE_TABLET | Freq: Two times a day (BID) | ORAL | Status: DC
  Administered 2023-03-09 – 2023-03-10 (×3): 40 mg via ORAL
  Filled 2023-03-09 (×3): qty 1

## 2023-03-09 MED ORDER — ONDANSETRON HCL 4 MG/2ML IJ SOLN: 4.0000 mg | Freq: Four times a day (QID) | INTRAMUSCULAR | Status: DC | PRN

## 2023-03-09 MED ORDER — ALPRAZOLAM 0.5 MG PO TABS: 0.5000 mg | ORAL_TABLET | Freq: Two times a day (BID) | ORAL | Status: DC | PRN

## 2023-03-09 MED ORDER — ATORVASTATIN CALCIUM 20 MG PO TABS
40.0000 mg | ORAL_TABLET | Freq: Every day | ORAL | Status: DC
  Administered 2023-03-09: 40 mg via ORAL
  Filled 2023-03-09: qty 2

## 2023-03-09 MED ORDER — ACETAMINOPHEN 325 MG PO TABS: 650.0000 mg | ORAL_TABLET | Freq: Four times a day (QID) | ORAL | Status: DC | PRN

## 2023-03-09 MED ORDER — CARVEDILOL 3.125 MG PO TABS
3.1250 mg | ORAL_TABLET | Freq: Two times a day (BID) | ORAL | Status: DC
  Administered 2023-03-09 – 2023-03-10 (×2): 3.125 mg via ORAL
  Filled 2023-03-09 (×2): qty 1

## 2023-03-09 MED ORDER — FUROSEMIDE 10 MG/ML IJ SOLN
40.0000 mg | Freq: Two times a day (BID) | INTRAMUSCULAR | Status: DC
  Administered 2023-03-09: 40 mg via INTRAVENOUS
  Filled 2023-03-09: qty 4

## 2023-03-09 NOTE — ED Notes (Signed)
Ambulatory to toilet

## 2023-03-09 NOTE — TOC Initial Note (Signed)
Transition of Care [facility]) - Initial/Assessment Note    Patient Details  Name: Misty Adams MRN: [id number] Date of Birth: January 14, 1956  Transition of Care [facility]) CM/SW Contact:    Marquita Palms, LCSW Phone Number: 03/09/2023, 10:56 AM  Clinical Narrative:                  CSW met with patient and her sister bedside. Patient agreeable for sister to be there while CSW asked questioned. Patient reported [facility] [address]. Patient lives [place] 2 adult children [place] and reports shortness of breath became more intense. Patient reports she has O2 @ 2L. Patient reports she would be agreeable to Ptand or OT while home. Awaiting to be admitted Expected Discharge Plan: Home w Home Health Services Barriers to Discharge: No Barriers Identified   Patient Goals and CMS Choice Patient states their goals for this hospitalization and ongoing recovery are:: to go home andlive with her family.          Expected Discharge Plan and Services In-house Referral: Clinical Social Work Discharge Planning Services: NA Post Acute Care Choice: Home Health Living arrangements for the past 2 months: Single Family Home                   DME Agency: NA                  Prior Living Arrangements/Services Living arrangements for the past 2 months: Single Family Home Lives with:: Adult Children, Self Patient language and need for interpreter reviewed:: No        Need for Family Participation in Patient Care: Yes (Comment) Care giver support system in place?: Yes (comment) Current home services: Other (comment) (3.5L O2) Criminal Activity/Legal Involvement Pertinent to Current Situation/Hospitalization: No - Comment as needed  Activities of Daily Living   ADL Screening (condition at time of admission) Independently performs ADLs?: Yes (appropriate for developmental age) Is the patient deaf or have difficulty hearing?: No Does the patient have difficulty seeing, even when  wearing glasses/contacts?: No Does the patient have difficulty concentrating, remembering, or making decisions?: No  Permission Sought/Granted   Permission granted to share information with : Yes, Verbal Permission Granted  Share Information with NAME: HEATHE, MONGE (Daughter)  [phone] (Mobile)  Permission granted to share info w AGENCY: N/A  Permission granted to share info w Relationship: N/A  Permission granted to share info w Contact Information: Sister  Emotional Assessment Appearance:: Disheveled Attitude/Demeanor/Rapport: Gracious Affect (typically observed): Calm Orientation: : Oriented to Self, Oriented to Place, Oriented to  Time, Oriented to Situation   Psych Involvement: No (comment)  Admission diagnosis:  Acute respiratory failure with hypoxia (HCC) [J96.01] Patient Active Problem List   Diagnosis Date Noted   Acute respiratory failure with hypoxia (HCC) 03/09/2023   Acute on chronic diastolic CHF (congestive heart failure) (HCC) 03/09/2023   Dyslipidemia 03/09/2023   GERD without esophagitis 03/09/2023   Acute kidney injury superimposed on chronic kidney disease (HCC) 03/09/2023   Symptomatic anemia 12/08/2022   Severe sepsis (HCC) 07/16/2022   Acute pyelonephritis 07/16/2022   Lactic acidosis 07/16/2022   Thrombocytopenia (HCC) 07/16/2022   Essential hypertension 07/16/2022   Hyperlipidemia, unspecified 07/16/2022   Hypothyroidism 07/16/2022   Anxiety and depression 07/16/2022   Acute renal failure with acute tubular necrosis superimposed on stage 3b chronic kidney disease (HCC) 07/14/2022   Chronic obstructive pulmonary disease (HCC) 07/14/2022   Pneumonia 09/21/2020   Pneumonia due to COVID-19  virus 09/20/2020   Chronic venous insufficiency 09/16/2017   Subclavian artery stenosis, left (HCC) 07/15/2017   PCP:  Danella Penton, MD Pharmacy:   [phone] [address], [facility] - [address] [place] [address] Phone:  [phone] Fax: [phone]     Social Determinants of Health (SDOH) Social History: SDOH Screenings   Food Insecurity: No Food Insecurity (03/09/2023)  Housing: Low Risk  (03/09/2023)  Transportation Needs: No Transportation Needs (03/09/2023)  Utilities: Not At Risk (03/09/2023)  Depression (PHQ2-9): Low Risk  (12/08/2022)  Financial Resource Strain: Low Risk  (01/12/2023)   Received from [facility]  Tobacco Use: High Risk (03/09/2023)   SDOH Interventions:     Readmission Risk Interventions     No data to display

## 2023-03-09 NOTE — Assessment & Plan Note (Signed)
-   This is clearly secondary to acute CHF and is requiring BiPAP. - The patient will be admitted to a progressive unit bed. - Will continue BiPAP overnight. - Will follow O2 protocol. - Management otherwise as below.

## 2023-03-09 NOTE — Assessment & Plan Note (Signed)
-  Continue antihypertensive therapy ?

## 2023-03-09 NOTE — Assessment & Plan Note (Signed)
-   I suspect diastolic etiology. - The patient began diuresis with IV Lasix. - We will follow serial troponins. - We will obtain a 2D echo. - I notified [facility] group about the patient.

## 2023-03-09 NOTE — H&P (Addendum)
[place]   PATIENT NAME: Misty Adams    MR#:  [id number]  DATE OF BIRTH:  1955-09-26  DATE OF ADMISSION:  03/08/2023  PRIMARY CARE PHYSICIAN: Danella Penton, MD   Patient is coming from: Home  REQUESTING/REFERRING PHYSICIAN: Pilar Jarvis, MD  CHIEF COMPLAINT:   Chief Complaint  Patient presents with   Respiratory Distress    Hx of CHF/ COPD. Pt recently returned from cruise, woke up with [employer], Sats on baseline 3L O2 was 60%, Noted fluid on lungs and audible crackles. 92% on 8L, initial BP 218/82 and increased swelling to BLE    HISTORY OF PRESENT ILLNESS:  VIRLIE ZENG is a 67 y.o. Caucasian female with medical history significant for COPD, CHF, hypertension, dyslipidemia, hypothyroidism and peripheral vascular disease, who presented to the emergency room with acute onset of worsening dyspnea over the last couple of days with associated dry cough and occasional wheezing.  She denied any chills however has been having low-grade temperature of 99.6.  She admits to orthopnea and worsening lower extremity edema.  She denied any paroxysmal nocturnal dyspnea.  No chest pain or palpitations.  No dysuria, oliguria or hematuria or flank pain.  ED Course: When the patient came to the ER, BP was 175/68 with respiratory rate 25 and pulse oximetry 93% on BiPAP at 40% FiO2 given significant respiratory distress.  CMP revealed a BUN of 50 and creatinine of 2.01 from 24/1.38 on 01/21/2023.  Albumin was 3.4 with total protein of 7.2.  BNP was 710.1 and high sensitive troponin I 18.  CBC showed anemia close to baseline with microcytosis.  Respiratory panel came back negative.   EKG as reviewed by me :  showed sinus rhythm with rate of 97 with probable left atrial enlargement. Imaging: Portable chest x-ray showed acute CHF.  The patient was given 80 mg IV Lasix and sublingual nitroglycerin.  The patient will be admitted to a progressive unit bed for further evaluation and  management. PAST MEDICAL HISTORY:   Past Medical History:  Diagnosis Date   Allergy    Anxiety    CHF (congestive heart failure) (HCC)    COPD (chronic obstructive pulmonary disease) (HCC)    Depression    Diabetes mellitus without complication (HCC)    Hyperlipidemia    Hypertension    Hypothyroidism    PAD (peripheral artery disease) (HCC)    Psoriasis    Substance abuse (HCC)    Tobacco abuse     PAST SURGICAL HISTORY:   Past Surgical History:  Procedure Laterality Date   ABDOMINAL HYSTERECTOMY     AORTIC ARCH ANGIOGRAPHY N/A 08/06/2017   Procedure: AORTIC ARCH ANGIOGRAPHY;  Surgeon: Fransisco Hertz, MD;  Location: [facility] INVASIVE CV LAB;  Service: Cardiovascular;  Laterality: N/A;   APPENDECTOMY     BIOPSY  12/19/2022   Procedure: BIOPSY;  Surgeon: Jaynie Collins, DO;  Location: [facility] ENDOSCOPY;  Service: Gastroenterology;;   CESAREAN SECTION     COLONOSCOPY     COLONOSCOPY WITH PROPOFOL N/A 12/07/2020   Procedure: COLONOSCOPY WITH PROPOFOL;  Surgeon: Jaynie Collins, DO;  Location: [facility] ENDOSCOPY;  Service: Gastroenterology;  Laterality: N/A;   COLONOSCOPY WITH PROPOFOL N/A 12/19/2022   Procedure: COLONOSCOPY WITH PROPOFOL;  Surgeon: Jaynie Collins, DO;  Location: [facility] ENDOSCOPY;  Service: Gastroenterology;  Laterality: N/A;   dilatation and curettage     DILATION AND CURETTAGE OF UTERUS     ESOPHAGOGASTRODUODENOSCOPY N/A 12/07/2020   Procedure: ESOPHAGOGASTRODUODENOSCOPY (  EGD);  Surgeon: Jaynie Collins, DO;  Location: [facility] ENDOSCOPY;  Service: Gastroenterology;  Laterality: N/A;   ESOPHAGOGASTRODUODENOSCOPY (EGD) WITH PROPOFOL N/A 12/19/2022   Procedure: ESOPHAGOGASTRODUODENOSCOPY (EGD) WITH PROPOFOL;  Surgeon: Jaynie Collins, DO;  Location: [facility] ENDOSCOPY;  Service: Gastroenterology;  Laterality: N/A;   KNEE SURGERY     left wrist ganglion cyst removal     OOPHORECTOMY  1994   PERIPHERAL VASCULAR INTERVENTION Left 08/06/2017   Procedure:  PERIPHERAL VASCULAR INTERVENTION;  Surgeon: Fransisco Hertz, MD;  Location: [facility] INVASIVE CV LAB;  Service: Cardiovascular;  Laterality: Left;  subclavian   POLYPECTOMY  12/19/2022   Procedure: POLYPECTOMY;  Surgeon: Jaynie Collins, DO;  Location: [facility] ENDOSCOPY;  Service: Gastroenterology;;   TUBAL LIGATION      SOCIAL HISTORY:   Social History   Tobacco Use   Smoking status: Every Day    Current packs/day: 2.00    Average packs/day: 2.0 packs/day for 50.0 years (100.0 ttl pk-yrs)    Types: Cigarettes   Smokeless tobacco: Never  Substance Use Topics   Alcohol use: Yes    FAMILY HISTORY:   Family History  Problem Relation Age of Onset   Uterine cancer Mother    Stroke Mother    Aneurysm Mother    Heart attack Father    Coronary artery disease Father    Leukemia Father    Depression Father    Diabetes Father    Hyperlipidemia Father    Hypertension Father    Colon polyps Sister    Obesity Sister    Thyroid disease Sister    COPD Sister    Lung cancer Sister    Breast cancer Maternal Aunt    Breast cancer Maternal Aunt     DRUG ALLERGIES:   Allergies  Allergen Reactions   Hydrocodone-Acetaminophen Hives   Vicodin [Hydrocodone-Acetaminophen] Hives   Trelegy Ellipta [Fluticasone-Umeclidin-Vilant] Rash    Changed to Budeson-Glycopyrrol-Formoterol by provider    REVIEW OF SYSTEMS:   ROS As per history of present illness. All pertinent systems were reviewed above. Constitutional, HEENT, cardiovascular, respiratory, GI, GU, musculoskeletal, neuro, psychiatric, endocrine, integumentary and hematologic systems were reviewed and are otherwise negative/unremarkable except for positive findings mentioned above in the HPI.   MEDICATIONS AT HOME:   Prior to Admission medications   Medication Sig Start Date End Date Taking? Authorizing Provider  acetaminophen (TYLENOL) 500 MG tablet Take 1,000 mg by mouth every 6 (six) hours as needed for moderate pain or headache.     [provider]  albuterol (PROVENTIL HFA;VENTOLIN HFA) 108 (90 Base) MCG/ACT inhaler Inhale 2 puffs into the lungs every 6 (six) hours as needed for wheezing or shortness of breath. 07/14/17   Merrily Brittle, MD  ALPRAZolam Prudy Feeler) 0.5 MG tablet Take 0.5 mg by mouth 2 (two) times daily as needed for anxiety.  01/16/17   [provider]  aspirin 81 MG tablet Take 81 mg by mouth at bedtime.     [provider]  atorvastatin (LIPITOR) 40 MG tablet Take 1 tablet (40 mg total) by mouth at bedtime. 07/14/17   Merrily Brittle, MD  BREZTRI AEROSPHERE 160-9-4.8 MCG/ACT AERO Inhale 2 puffs into the lungs 2 (two) times daily. 06/23/22 06/23/23  [provider]  gabapentin (NEURONTIN) 100 MG capsule Take 200 mg by mouth at bedtime.    [provider]  guaiFENesin-dextromethorphan (ROBITUSSIN DM) 100-10 MG/5ML syrup Take 10 mLs by mouth every 4 (four) hours as needed for cough. 09/21/20   Regalado, [employer]  A, MD  levothyroxine (SYNTHROID, LEVOTHROID) 125 MCG tablet Take 125 mcg by mouth daily.     [provider]  ondansetron (ZOFRAN) 4 MG tablet Take 4 mg by mouth every 4 (four) hours as needed for nausea or vomiting.    [provider]  OXYGEN Inhale 2 L/L into the lungs at bedtime.    [provider]  pantoprazole (PROTONIX) 40 MG tablet Take 40 mg by mouth 2 (two) times daily.    [provider]  temazepam (RESTORIL) 15 MG capsule Take 15 mg by mouth at bedtime as needed for sleep. 08/20/20   [provider]  traZODone (DESYREL) 100 MG tablet Take 100 mg by mouth daily as needed for sleep. 08/31/20   [provider]  venlafaxine XR (EFFEXOR-XR) 75 MG 24 hr capsule Take 150 mg by mouth daily. 04/24/22 04/24/23  [provider]      VITAL SIGNS:  Blood pressure (!) 126/53, pulse 72, temperature 98.1 F (36.7 C), temperature source Oral, resp. rate 15, SpO2 96%.  PHYSICAL EXAMINATION:  Physical Exam  GENERAL:   67 y.o.-year-old Caucasian female patient lying in the bed with mild respiratory distress with conversational dyspnea on BiPAP. EYES: Pupils equal, round, reactive to light and accommodation. No scleral icterus. Extraocular muscles intact.  HEENT: Head atraumatic, normocephalic. Oropharynx and nasopharynx clear.  NECK:  Supple, no jugular venous distention. No thyroid enlargement, no tenderness.  LUNGS: Diminished bibasal breath sounds with bibasal rales.. No use of accessory muscles of respiration.  CARDIOVASCULAR: Regular rate and rhythm, S1, S2 normal. No murmurs, rubs, or gallops.  ABDOMEN: Soft, nondistended, nontender. Bowel sounds present. No organomegaly or mass.  EXTREMITIES: 1+ bilateral lower extremity pitting edema with no cyanosis, or clubbing.  NEUROLOGIC: Cranial nerves II through XII are intact. Muscle strength 5/5 in all extremities. Sensation intact. Gait not checked.  PSYCHIATRIC: The patient is alert and oriented x 3.  Normal affect and good eye contact. SKIN: No obvious rash, lesion, or ulcer.   LABORATORY PANEL:   CBC Recent Labs  Lab 03/08/23 2325  WBC 9.2  HGB 10.9*  HCT 37.3  PLT 229   ------------------------------------------------------------------------------------------------------------------  Chemistries  Recent Labs  Lab 03/08/23 2325  NA 139  K 4.3  CL 103  CO2 26  GLUCOSE 147*  BUN 50*  CREATININE 2.01*  CALCIUM 8.9  AST 15  ALT 21  ALKPHOS 80  BILITOT 0.9   ------------------------------------------------------------------------------------------------------------------  Cardiac Enzymes No results for input(s): "TROPONINI" in the last 168 hours. ------------------------------------------------------------------------------------------------------------------  RADIOLOGY:  DG Chest Port 1 View  Result Date: 03/08/2023 CLINICAL DATA:  Shortness of breath EXAM: PORTABLE CHEST 1 VIEW COMPARISON:  07/14/2022 FINDINGS: Cardiac shadow is  prominent. Aortic calcifications are seen. Increasing vascular congestion is noted with mild interstitial edema. Basilar atelectatic changes are noted as well. No bony abnormality is seen. IMPRESSION: Changes of mild CHF. Electronically Signed   By: Alcide Clever M.D.   On: 03/08/2023 23:38      IMPRESSION AND PLAN:  Assessment and Plan: * Acute respiratory failure with hypoxia (HCC) - This is clearly secondary to acute CHF and is requiring BiPAP. - The patient will be admitted to a progressive unit bed. - Will continue BiPAP overnight. - Will follow O2 protocol. - Management otherwise as below.  Acute on chronic diastolic CHF (congestive heart failure) (HCC) - I suspect diastolic etiology. - The patient began diuresis with IV Lasix. - We will follow serial troponins. - We will obtain a  2D echo. - I notified [facility] group about the patient.  Dyslipidemia - We will continue statin therapy.  Hypothyroidism - We will continue Synthroid.  GERD without esophagitis - We will continue PPI therapy.  Anxiety and depression Will continue Effexor XR, trazodone and Restoril as well as Xanax.  Essential hypertension - Continue antihypertensive therapy.    DVT prophylaxis: Lovenox.  Advanced Care Planning:  Code Status: full code.  Family Communication:  The plan of care was discussed in details with the patient (and family). I answered all questions. The patient agreed to proceed with the above mentioned plan. Further management will depend upon hospital course. Disposition Plan: Back to previous home environment Consults called: Cardiology All the records are reviewed and case discussed with ED provider.  Status is: Inpatient  At the time of the admission, it appears that the appropriate admission status for this patient is inpatient.  This is judged to be reasonable and necessary in order to provide the required intensity of service to ensure the patient's safety given the presenting  symptoms, physical exam findings and initial radiographic and laboratory data in the context of comorbid conditions.  The patient requires inpatient status due to high intensity of service, high risk of further deterioration and high frequency of surveillance required.  I certify that at the time of admission, it is my clinical judgment that the patient will require inpatient hospital care extending more than 2 midnights.                            Dispo: The patient is from: Home              Anticipated d/c is to: Home              Patient currently is not medically stable to d/c.              Difficult to place patient: No  Authorized and performed by: Valente David, MD Total critical care time:    50    minutes. Due to a high probability of clinically significant, life-threatening deterioration, the patient required my highest level of preparedness to intervene emergently and I personally spent this critical care time directly and personally managing the patient.  This critical care time included obtaining a history, examining the patient, pulse oximetry, ordering and review of studies, arranging urgent treatment with development of management plan, evaluation of patient's response to treatment, frequent reassessment, and discussions with other providers. This critical care time was performed to assess and manage the high probability of imminent, life-threatening deterioration that could result in multiorgan failure.  It was exclusive of separately billable procedures and treating other patients and teaching time.   Hannah Beat M.D on 03/09/2023 at 3:13 AM  [facility]   From 7 PM-7 AM, contact night-coverage [email or web address]  CC: Primary care physician; Danella Penton, MD

## 2023-03-09 NOTE — ED Notes (Signed)
Pt provided lunch tray.

## 2023-03-09 NOTE — ED Notes (Signed)
Pt requested a snack. This tech provided pt with 2 packs of graham crackers and 1 [place] cola.  Pt is lying in bed, in lowest locked position with call bell in reach.

## 2023-03-09 NOTE — Consult Note (Signed)
Cardiology Consult    Patient ID: KRISTINIA LOVEALL MRN: [id number], DOB/AGE: 09-13-1955   Admit date: 03/08/2023 Date of Consult: 03/09/2023  Primary Physician: Danella Penton, MD Primary Cardiologist: Lorine Bears, MD - new Requesting Provider: Andrez Grime, MD  Patient Profile    TELITHA STANDIFORD is a 67 y/o ? w/ a h/o HTN, PAD s/p L subclavian stenting (07/2017), DM, HL, tob abuse, COPD, ILD, pulm nodules, Ao atherosclerosis, CKD III, anxiety, depression, anemia, and hypothyroidism, who is being seen today for the evaluation of CHF at the request of Dr. Arville Care.  Past Medical History  Subjective  Past Medical History:  Diagnosis Date   Allergy    Anxiety    CHF (congestive heart failure) (HCC)    COPD (chronic obstructive pulmonary disease) (HCC)    Depression    Diabetes mellitus without complication (HCC)    Hyperlipidemia    Hypertension    Hypothyroidism    PAD (peripheral artery disease) (HCC)    Psoriasis    Substance abuse (HCC)    Tobacco abuse     Past Surgical History:  Procedure Laterality Date   ABDOMINAL HYSTERECTOMY     AORTIC ARCH ANGIOGRAPHY N/A 08/06/2017   Procedure: AORTIC ARCH ANGIOGRAPHY;  Surgeon: Fransisco Hertz, MD;  Location: [facility] INVASIVE CV LAB;  Service: Cardiovascular;  Laterality: N/A;   APPENDECTOMY     BIOPSY  12/19/2022   Procedure: BIOPSY;  Surgeon: Jaynie Collins, DO;  Location: [facility] ENDOSCOPY;  Service: Gastroenterology;;   CESAREAN SECTION     COLONOSCOPY     COLONOSCOPY WITH PROPOFOL N/A 12/07/2020   Procedure: COLONOSCOPY WITH PROPOFOL;  Surgeon: Jaynie Collins, DO;  Location: [facility] ENDOSCOPY;  Service: Gastroenterology;  Laterality: N/A;   COLONOSCOPY WITH PROPOFOL N/A 12/19/2022   Procedure: COLONOSCOPY WITH PROPOFOL;  Surgeon: Jaynie Collins, DO;  Location: [facility] ENDOSCOPY;  Service: Gastroenterology;  Laterality: N/A;   dilatation and curettage     DILATION AND CURETTAGE OF UTERUS     ESOPHAGOGASTRODUODENOSCOPY  N/A 12/07/2020   Procedure: ESOPHAGOGASTRODUODENOSCOPY (EGD);  Surgeon: Jaynie Collins, DO;  Location: [facility] ENDOSCOPY;  Service: Gastroenterology;  Laterality: N/A;   ESOPHAGOGASTRODUODENOSCOPY (EGD) WITH PROPOFOL N/A 12/19/2022   Procedure: ESOPHAGOGASTRODUODENOSCOPY (EGD) WITH PROPOFOL;  Surgeon: Jaynie Collins, DO;  Location: [facility] ENDOSCOPY;  Service: Gastroenterology;  Laterality: N/A;   KNEE SURGERY     left wrist ganglion cyst removal     OOPHORECTOMY  1994   PERIPHERAL VASCULAR INTERVENTION Left 08/06/2017   Procedure: PERIPHERAL VASCULAR INTERVENTION;  Surgeon: Fransisco Hertz, MD;  Location: [facility] INVASIVE CV LAB;  Service: Cardiovascular;  Laterality: Left;  subclavian   POLYPECTOMY  12/19/2022   Procedure: POLYPECTOMY;  Surgeon: Jaynie Collins, DO;  Location: [facility] ENDOSCOPY;  Service: Gastroenterology;;   TUBAL LIGATION       Allergies  Allergies  Allergen Reactions   Hydrocodone-Acetaminophen Hives   Vicodin [Hydrocodone-Acetaminophen] Hives   Trelegy Ellipta [Fluticasone-Umeclidin-Vilant] Rash    Changed to Budeson-Glycopyrrol-Formoterol by provider      History of Present Illness     67 y/o ? w/ a h/o HTN, PAD s/p L subclavian stenting (07/2017), DM, HL, tob abuse, COPD, ILD, pulm nodules, Ao atherosclerosis, CKD III, anxiety, depression, anemia, and hypothyroidism.  She was previously evaluated for chest pain in 09/2009, and underwent Ex myoview, which was w/o ischemia or infarct w/ EF of 79%.  In 07/2017, she required stenting of the L subclavian artery due to severe stenosis.  In  06/2022, she was admitted to [facility] w/ E coli UTI, severe sepsis, and AKI.  High sensitivity troponins rose to 56 during that admission.  She did not undergo any cardiology evaluation.  IN 11/2022 CT of the chest was performed due to wt loss and showed Ao atherosclerosis, unchanged 4mm R apical pulm nodule, enlarged pulm trunk - PAH, and L adrenal adenoma.   Ms. Venditto says that she has  been told that she has CHF and takes hydrochlorothiazide and lasix @ home, but has never been followed by cardiology. She cont to smoke 1ppd and in that setting, has chronic DOE, though this has worsened over the past year.  Her wt is usually pretty stable @ home.  About once every 2-3 mos she'll have an episode of tightness in her jaws, arms, and chest, usually occurring @ rest, lasting about 30 mins, and resolving after taking ASA.  She is very sedentary @ home and has not had any exertional c/p.    She went on a [place] cruise last week, and while on  the cruise, she felt more dyspneic and fatigued.  No c/p.  She returned home @ 1am on 11/17 and went to bed.  When she awoke later in the morning, she ate bfast and then became markedly dyspneic.  She called her son, who is a paramedic, and then EMS.  She was hypoxic in the field and required Bi-PAP.  Upon arrival to the ED, she was afebrile and hypertensive @ 175/68.  SpO2 93% on Bi-PAP. ECG showed sinus rhythm @ 97 w/ baseline artifact.  Labs notable for elevated BUN/Creat above prior baseline @ 50/2.01, stable normocytic anemia (10.9/37.3).  BNP was elevated @ 710.1.  Trop minimally elevated @ 18.  CXR notable for mild CHF.  She was treated w/ lasix 80 IV x 1 and sl NTG.  Weaned to 4lpm via [place] by 7am. BP remains elevated this AM @ 172/68.  She notes good response to IV lasix thus far and is w/o complaints currently.  Inpatient Medications  Subjective    aspirin EC  81 mg Oral QHS   atorvastatin  40 mg Oral QHS   carvedilol  3.125 mg Oral BID WC   enoxaparin (LOVENOX) injection  40 mg Subcutaneous Q24H   furosemide  80 mg Intravenous BID PC   gabapentin  200 mg Oral QHS   levothyroxine  125 mcg Oral Q0600   pantoprazole  40 mg Oral BID   venlafaxine XR  150 mg Oral Daily    Family History    Family History  Problem Relation Age of Onset   Uterine cancer Mother    Stroke Mother    Aneurysm Mother    Heart attack Father    Coronary artery  disease Father    Leukemia Father    Depression Father    Diabetes Father    Hyperlipidemia Father    Hypertension Father    Colon polyps Sister    Obesity Sister    Thyroid disease Sister    COPD Sister    Lung cancer Sister    Breast cancer Maternal Aunt    Breast cancer Maternal Aunt    She indicated that her mother is deceased. She indicated that her father is deceased. She indicated that two of her three sisters are alive. She indicated that only one of her two maternal aunts is alive.   Social History    Social History   Socioeconomic History   Marital status: Divorced  Spouse name: Not on file   Number of children: Not on file   Years of education: Not on file   Highest education level: Not on file  Occupational History   Not on file  Tobacco Use   Smoking status: Every Day    Current packs/day: 2.00    Average packs/day: 2.0 packs/day for 50.0 years (100.0 ttl pk-yrs)    Types: Cigarettes   Smokeless tobacco: Never  Vaping Use   Vaping status: Former  Substance and Sexual Activity   Alcohol use: Yes   Drug use: Not Currently   Sexual activity: Not Currently  Other Topics Concern   Not on file  Social History Narrative   Lives locally by herself.  Sedentary.   Social Determinants of Health   Financial Resource Strain: Low Risk  (01/12/2023)   Received from [facility]   Overall Financial Resource Strain (CARDIA)    Difficulty of Paying Living Expenses: Not hard at all  Food Insecurity: No Food Insecurity (03/09/2023)   Hunger Vital Sign    Worried About Running Out of Food in the Last Year: Never true    Ran Out of Food in the Last Year: Never true  Transportation Needs: No Transportation Needs (03/09/2023)   PRAPARE - Administrator, Civil Service (Medical): No    Lack of Transportation (Non-Medical): No  Physical Activity: Not on file  Stress: Not on file  Social Connections: Not on file  Intimate Partner Violence:  Not At Risk (03/09/2023)   Humiliation, Afraid, Rape, and Kick questionnaire    Fear of Current or Ex-Partner: No    Emotionally Abused: No    Physically Abused: No    Sexually Abused: No     Review of Systems    General:  No chills, fever, night sweats or weight changes.  Cardiovascular:  +++ occas jaw/arm/chest pain/tightness, +++ progressive dyspnea on exertion, occas lower ext edema, +++ orthopnea prior to admission, no palpitations, paroxysmal nocturnal dyspnea. Dermatological: No rash, lesions/masses Respiratory: +++ chronic cough, +++ dyspnea Urologic: No hematuria, dysuria Abdominal:   No nausea, vomiting, diarrhea, bright red blood per rectum, melena, or hematemesis Neurologic:  No visual changes, wkns, changes in mental status. All other systems reviewed and are otherwise negative except as noted above.    Objective  Physical Exam    Blood pressure (!) 172/68, pulse 67, temperature 98.2 F (36.8 C), temperature source Oral, resp. rate 13, height 5\' 4"  (1.626 m), SpO2 96%.  General: Pleasant, NAD Psych: Normal affect. Neuro: Alert and oriented X 3. Moves all extremities spontaneously. HEENT: Normal  Neck: Supple without bruits.  JVD to jaw. Lungs:  Resp regular and unlabored, bibasilar crackles. Diminished breath sounds @ L base. Heart: RRR, 2/6 syst murmur @ LLSB, no s3, s4. Abdomen: Soft, non-tender, non-distended, BS + x 4.  Extremities: No clubbing, cyanosis.  Trace to 1+ bilat LE edema. DP/PT2+, Radials 2+ and equal bilaterally.  Labs    Cardiac Enzymes Recent Labs  Lab 03/08/23 2325 03/09/23 0648  TROPONINIHS 18* 38*     BNP    Component Value Date/Time   BNP 710.1 (H) 03/08/2023 2325   Lab Results  Component Value Date   WBC 6.8 03/09/2023   HGB 10.1 (L) 03/09/2023   HCT 34.2 (L) 03/09/2023   MCV 83.0 03/09/2023   PLT 227 03/09/2023    Recent Labs  Lab 03/08/23 2325 03/09/23 0648  NA 139 139  K 4.3 4.4  CL 103  101  CO2 26 26  BUN 50* 50*   CREATININE 2.01* 1.91*  CALCIUM 8.9 8.8*  PROT 7.2  --   BILITOT 0.9  --   ALKPHOS 80  --   ALT 21  --   AST 15  --   GLUCOSE 147* 103*    Radiology Studies    DG Chest Port 1 View  Result Date: 03/08/2023 CLINICAL DATA:  Shortness of breath EXAM: PORTABLE CHEST 1 VIEW COMPARISON:  07/14/2022 FINDINGS: Cardiac shadow is prominent. Aortic calcifications are seen. Increasing vascular congestion is noted with mild interstitial edema. Basilar atelectatic changes are noted as well. No bony abnormality is seen. IMPRESSION: Changes of mild CHF. Electronically Signed   By: Alcide Clever M.D.   On: 03/08/2023 23:38      ECG & Cardiac Imaging    sinus rhythm @ 97 w/ baseline artifact - personally reviewed.  Assessment & Plan    1.  Acute on chronic CHF:  pt reports h/o CHF, though does not follow w/ cardiology and doesn't appear to have ever had an echo.  She takes lasix and hydrochlorothiazide home.  She returned from a 7 day [place] cruise on 11/17 and developed profound dyspnea after awakening later in the morning prompting EMS eval  Hypoxic  Bi-PAP  ED.  She was hypertensive on arrival here.  ECG unremarkable, hsTrops 18  38, CXR w/ mild CHF.  She notes good response to IV lasix thus far.  I/O not recorded.  Bibasilar crackles, JVD, and mild lower ext edema on exam.  Creat initially above prior baseline @ 2.01, sl better this AM @ 1.91.  I will increase lasix to 80 IV bid.  Add low dose carvedilol w/ ongoing HTN.  F/u echo.   2.  Demand Ischemia:  In setting of above, hsTrop18  38.  Prior h/o progressive DOE and intermittent c/p about once q 2-3 months, often occurring @ rest, lasting about 30 mins, and resolving w/ ASA.  ECG unremarkable.  Currently pain free.  Suspect demand ischemia.  Trend troponin to peak.  F/u echo. Will benefit from ischemic eval given multiple risk factors, though can likely be done as outpt provided echo looks ok.  Cont asa, statin. Adding ? blocker.  3.  HTN/HTN  urgency:  BP elevated on admission and ongoing this AM.  Diurese as above.  Adding low-dose carvedilol.  Follow.  4.  HL:  Cont statin.  5.  Tob abuse/COPD/ILD:  Still smoking 1ppd.  Cessation advised.  Per IM.  6.  Acute on chronic stage III kidney dzs:  Creat elevated above prior baseline.  Sl better w/ diuresis. Follow.  Risk Assessment/Risk Scores:        New York Heart Association (NYHA) Functional Class NYHA Class IV    Signed, Nicolasa Ducking, NP 03/09/2023, 9:52 AM  For questions or updates, please contact   Please consult [email or web address] for contact info under Cardiology/STEMI.

## 2023-03-09 NOTE — Assessment & Plan Note (Signed)
-   We will continue Synthroid. 

## 2023-03-09 NOTE — Progress Notes (Signed)
[facility]  - [place] at [facility]   PATIENT NAME: Misty Adams    MR#:  [id number]  DATE OF BIRTH:  1955-11-24  SUBJECTIVE:  patient seen in the ER came in with increasing shortness of breath. She recently went on a cruise came back started feeling short of breath gradually progress became worse came to the emergency room was placed on BiPAP received IV Lasix found to be in congestive heart failure. Currently on nasal cannula oxygen 4 L with sats 92 to 99%  patient overall feels a lot better.    VITALS:  Blood pressure (!) 173/68, pulse 62, temperature 98 F (36.7 C), resp. rate 18, height 5\' 4"  (1.626 m), SpO2 97%.  PHYSICAL EXAMINATION:   GENERAL:  67 y.o.-year-old patient with no acute distress. Appears chronically ill LUNGS: decreased breath sounds bilaterally, no wheezing CARDIOVASCULAR: S1, S2 normal. No murmur   ABDOMEN: Soft, nontender, nondistended. Bowel sounds present.  EXTREMITIES: No  edema b/l.    NEUROLOGIC: nonfocal  patient is alert and awake  LABORATORY PANEL:  CBC Recent Labs  Lab 03/09/23 0648  WBC 6.8  HGB 10.1*  HCT 34.2*  PLT 227    Chemistries  Recent Labs  Lab 03/08/23 2325 03/09/23 0648  NA 139 139  K 4.3 4.4  CL 103 101  CO2 26 26  GLUCOSE 147* 103*  BUN 50* 50*  CREATININE 2.01* 1.91*  CALCIUM 8.9 8.8*  AST 15  --   ALT 21  --   ALKPHOS 80  --   BILITOT 0.9  --     RADIOLOGY:  DG Chest Port 1 View  Result Date: 03/08/2023 CLINICAL DATA:  Shortness of breath EXAM: PORTABLE CHEST 1 VIEW COMPARISON:  07/14/2022 FINDINGS: Cardiac shadow is prominent. Aortic calcifications are seen. Increasing vascular congestion is noted with mild interstitial edema. Basilar atelectatic changes are noted as well. No bony abnormality is seen. IMPRESSION: Changes of mild CHF. Electronically Signed   By: Alcide Clever M.D.   On: 03/08/2023 23:38    Assessment and Plan  Misty Adams is a 67 y.o. Caucasian female with medical  history significant for COPD, CHF, hypertension, dyslipidemia, hypothyroidism and peripheral vascular disease, who presented to the emergency room with acute onset of worsening dyspnea  BNP was 710  chest x-ray showed acute CHF.   Patient initially was placed on BiPAP now on nasal cannula 4 L. Patient continues to smoke heavily at home.  Acute hypoxic respiratory failure secondary to congestive heart failure/pulmonary edema requiring BiPAP now on nasal cannula -- IV Lasix 80 mg BID -- appreciate [facility] cardiology input. -- Low-dose Coreg  Acute on chronic diastolic CHF (congestive heart failure) (HCC) elevated troponin appears due to demand ischemia. -  diuresis with IV Lasix. -- Patient denies any chest pain - 2D echo. Reviewed   Dyslipidemia -  continue statin therapy.   Hypothyroidism - continue Synthroid.   GERD without esophagitis - continue PPI therapy.   Anxiety and depression -- continue Effexor XR, trazodone and Restoril as well as Xanax.   Essential hypertension - Continue antihypertensive therapy.  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  >50% time spent on counselling and coordination of care  Note: This dictation was prepared with Dragon dictation along with smaller phrase technology. Any transcriptional errors that result from this process are unintentional.  Enedina Finner M.D    [facility]   CC: Primary care physician; Danella Penton, MD

## 2023-03-09 NOTE — ED Notes (Signed)
Pt ambulatory to toilet

## 2023-03-09 NOTE — Assessment & Plan Note (Signed)
Will continue Effexor XR, trazodone and Restoril as well as Xanax.

## 2023-03-09 NOTE — Assessment & Plan Note (Signed)
-   We will continue statin therapy. 

## 2023-03-09 NOTE — Assessment & Plan Note (Signed)
-   We will continue PPI therapy 

## 2023-03-09 NOTE — ED Notes (Signed)
Dr Allena Katz notified of increased troponin from 18 to 38

## 2023-03-09 NOTE — Assessment & Plan Note (Signed)
-   This is acute kidney injury superimposed on stage IIIb chronic kidney disease. - This is likely prerenal secondary to acute CHF. - Will follow BMP with diuresis.

## 2023-03-10 ENCOUNTER — Encounter: Payer: Self-pay | Admitting: Family Medicine

## 2023-03-10 ENCOUNTER — Telehealth (HOSPITAL_COMMUNITY): Payer: Self-pay | Admitting: Pharmacy Technician

## 2023-03-10 ENCOUNTER — Encounter: Payer: Self-pay | Admitting: Internal Medicine

## 2023-03-10 ENCOUNTER — Other Ambulatory Visit: Payer: Self-pay | Admitting: Nurse Practitioner

## 2023-03-10 ENCOUNTER — Other Ambulatory Visit (HOSPITAL_COMMUNITY): Payer: Self-pay

## 2023-03-10 DIAGNOSIS — E876 Hypokalemia: Secondary | ICD-10-CM

## 2023-03-10 DIAGNOSIS — J9601 Acute respiratory failure with hypoxia: Secondary | ICD-10-CM | POA: Diagnosis not present

## 2023-03-10 DIAGNOSIS — I5033 Acute on chronic diastolic (congestive) heart failure: Secondary | ICD-10-CM | POA: Diagnosis not present

## 2023-03-10 DIAGNOSIS — N179 Acute kidney failure, unspecified: Secondary | ICD-10-CM

## 2023-03-10 DIAGNOSIS — I10 Essential (primary) hypertension: Secondary | ICD-10-CM

## 2023-03-10 LAB — BASIC METABOLIC PANEL
Anion gap: 10 (ref 5–15)
BUN: 52 mg/dL — ABNORMAL HIGH (ref 8–23)
CO2: 32 mmol/L (ref 22–32)
Calcium: 8.4 mg/dL — ABNORMAL LOW (ref 8.9–10.3)
Chloride: 93 mmol/L — ABNORMAL LOW (ref 98–111)
Creatinine, Ser: 2.12 mg/dL — ABNORMAL HIGH (ref 0.44–1.00)
GFR, Estimated: 25 mL/min — ABNORMAL LOW (ref >60)
Glucose, Bld: 150 mg/dL — ABNORMAL HIGH (ref 70–99)
Potassium: 2.8 mmol/L — ABNORMAL LOW (ref 3.5–5.1)
Sodium: 135 mmol/L (ref 135–145)

## 2023-03-10 MED ORDER — AMLODIPINE BESYLATE 5 MG PO TABS: 5.0000 mg | ORAL_TABLET | Freq: Every day | ORAL | 0 refills | Status: DC

## 2023-03-10 MED ORDER — POTASSIUM CHLORIDE CRYS ER 20 MEQ PO TBCR: 40.0000 meq | EXTENDED_RELEASE_TABLET | Freq: Two times a day (BID) | ORAL | 0 refills | Status: DC

## 2023-03-10 MED ORDER — POTASSIUM CHLORIDE CRYS ER 20 MEQ PO TBCR
40.0000 meq | EXTENDED_RELEASE_TABLET | Freq: Two times a day (BID) | ORAL | Status: DC
  Administered 2023-03-10: 40 meq via ORAL
  Filled 2023-03-10: qty 2

## 2023-03-10 MED ORDER — CARVEDILOL 6.25 MG PO TABS: 12.5000 mg | ORAL_TABLET | Freq: Two times a day (BID) | ORAL | Status: DC

## 2023-03-10 MED ORDER — AMLODIPINE BESYLATE 5 MG PO TABS
5.0000 mg | ORAL_TABLET | Freq: Every day | ORAL | Status: DC
  Administered 2023-03-10: 5 mg via ORAL
  Filled 2023-03-10: qty 1

## 2023-03-10 MED ORDER — CARVEDILOL 12.5 MG PO TABS: 12.5000 mg | ORAL_TABLET | Freq: Two times a day (BID) | ORAL | 0 refills | Status: DC

## 2023-03-10 NOTE — Discharge Summary (Signed)
Physician Discharge Summary   Patient: Misty Adams MRN: [id number] DOB: 07-Jun-1955  Admit date:     03/08/2023  Discharge date: 03/10/23  Discharge Physician: Enedina Finner   PCP: Danella Penton, MD   Recommendations at discharge:    Patient advised to hold Lasix and hydrochlorothiazide till blood work is done on Thursday at the medical [place] await advice from cardiology nurse practitioner.  Take your new prescription of potassium as instructed-- 40 meq BID for three days  Hold your home prescription of potassium for now  use your oxygen, nebulizer, inhalers as before  Discharge Diagnoses: Principal Problem:   Acute respiratory failure with hypoxia (HCC) Active Problems:   Acute on chronic diastolic CHF (congestive heart failure) (HCC)   Dyslipidemia   Hypothyroidism   Essential hypertension   Anxiety and depression   GERD without esophagitis   Acute kidney injury superimposed on chronic kidney disease (HCC)  Misty Adams is a 67 y.o. Caucasian female with medical history significant for COPD, CHF, hypertension, dyslipidemia, hypothyroidism and peripheral vascular disease, who presented to the emergency room with acute onset of worsening dyspnea  BNP was 710  chest x-ray showed acute CHF.    Patient initially was placed on BiPAP now on nasal cannula 4 L. Patient continues to smoke heavily at home.   Acute hypoxic respiratory failure secondary to congestive heart failure/pulmonary edema requiring BiPAP now on nasal cannula chronic respiratory failure/COPD/chronic smoker/chronic home oxygen 2 L -- IV Lasix 80 mg BID--now d/ced -- appreciate [facility] cardiology input. -- Low-dose Coreg-- increased 12.5 mg BID given elevated pressures abd added amlodipine --hold propranolol (home med) --HOLDING lasix and hydrochlorothiazide at discharge till outpatient labs are reviewed by [facility] cardiology who will instruct patient further. -- Patient is overall feeling better. Feels  back to baseline. She is eager to go home. Discharge instructions discussed with patient she voice understanding. She will follow-up with [facility] cardiology next week, get labs done this coming Thursday and follow further medication advised from cardiology nurse practitioner.   Acute on chronic diastolic CHF (congestive heart failure) (HCC) elevated troponin appears due to demand ischemia. -  diuresed with IV Lasix. -- Patient denies any chest pain - 2D echo. Reviewed   Dyslipidemia -  continue statin therapy.   Hypothyroidism - continue Synthroid.   GERD without esophagitis - continue PPI therapy.   Anxiety and depression -- continue Effexor XR, trazodone and Restoril as well as Xanax.   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 -- baseline creatinine around 1.4-- 1.9 -- came in with creatinine of 2.01---> 1.9--2.12  Hypokalemia --suspect due to diuretics --po KCL 40 meq bid x 3 days --get BMP on Thursday and follow furhter advise from cardiology         Family communication : none Consults : [facility] cardiology CODE STATUS: full DVT Prophylaxis : enoxaparin    Disposition: home Diet recommendation:  Cardiac diet DISCHARGE MEDICATION: Allergies as of 03/10/2023       Reactions   Hydrocodone-acetaminophen Hives   Vicodin [hydrocodone-acetaminophen] Hives   Trelegy Ellipta [fluticasone-umeclidin-vilant] Rash   Changed to Budeson-Glycopyrrol-Formoterol by provider        Medication List     STOP taking these medications    furosemide 20 MG tablet Commonly known as: LASIX   hydrochlorothiazide 25 MG tablet Commonly known as: HYDRODIURIL   propranolol 20 MG tablet Commonly known  as: INDERAL       TAKE these medications    acetaminophen 500 MG tablet Commonly known as: TYLENOL Take 1,000 mg by mouth every 6 (six) hours as needed for moderate  pain or headache.   albuterol 108 (90 Base) MCG/ACT inhaler Commonly known as: VENTOLIN HFA Inhale 2 puffs into the lungs every 6 (six) hours as needed for wheezing or shortness of breath.   ALPRAZolam 0.5 MG tablet Commonly known as: XANAX Take 0.5 mg by mouth 2 (two) times daily as needed for anxiety.   amLODipine 5 MG tablet Commonly known as: NORVASC Take 1 tablet (5 mg total) by mouth daily. Start taking on: March 11, 2023   aspirin 81 MG tablet Take 81 mg by mouth at bedtime.   atorvastatin 40 MG tablet Commonly known as: Lipitor Take 1 tablet (40 mg total) by mouth at bedtime.   Breztri Aerosphere 160-9-4.8 MCG/ACT Aero Generic drug: Budeson-Glycopyrrol-Formoterol Inhale 2 puffs into the lungs 2 (two) times daily.   carvedilol 12.5 MG tablet Commonly known as: COREG Take 1 tablet (12.5 mg total) by mouth 2 (two) times daily with a meal.   famotidine 40 MG tablet Commonly known as: PEPCID Take 40 mg by mouth daily.   gabapentin 100 MG capsule Commonly known as: NEURONTIN Take 200 mg by mouth at bedtime.   guaiFENesin-dextromethorphan 100-10 MG/5ML syrup Commonly known as: ROBITUSSIN DM Take 10 mLs by mouth every 4 (four) hours as needed for cough.   levothyroxine 125 MCG tablet Commonly known as: SYNTHROID Take 125 mcg by mouth daily.   ondansetron 4 MG tablet Commonly known as: ZOFRAN Take 4 mg by mouth every 4 (four) hours as needed for nausea or vomiting.   OXYGEN Inhale 2 L/L into the lungs at bedtime.   pantoprazole 40 MG tablet Commonly known as: PROTONIX Take 40 mg by mouth 2 (two) times daily.   potassium chloride 10 MEQ tablet Commonly known as: KLOR-CON Take 10 mEq by mouth daily. Notes to patient: HOLD THIS DOSE TILL FURTHER ADVISE AFTER YOUR BLOOD WORK ON thursday   potassium chloride SA 20 MEQ tablet Commonly known as: KLOR-CON M Take 2 tablets (40 mEq total) by mouth 2 (two) times daily for 3 days.   temazepam 15 MG  capsule Commonly known as: RESTORIL Take 15 mg by mouth at bedtime as needed for sleep.   traZODone 100 MG tablet Commonly known as: DESYREL Take 100 mg by mouth daily as needed for sleep.   venlafaxine XR 75 MG 24 hr capsule Commonly known as: EFFEXOR-XR Take 150 mg by mouth daily.        Follow-up Information     [facility]. Go in 5 day(s).   Specialty: Cardiology Why: Hospital Follow-Up 03/16/23 @ 8:30 AM Please bring all medications and weight log to appointment [facility] [address] Contact information: [address] [place] [address] [phone]        Danella Penton, MD. Schedule an appointment as soon as possible for a visit in 1 week(s).   Specialty: Internal Medicine Contact information: [phone] [facility] [address] [facility] [place] [address] [phone]         Iran Ouch, MD. Schedule an appointment as soon as possible for a visit in 1 week(s).   Specialty: Cardiology Why: Congestive heart failure. Contact information: [address] [place] [address] [phone]  GENERAL:  68 y.o.-year-old patient with no acute distress. Appears chronically ill LUNGS: decreased breath sounds bilaterally, no wheezing no respiratory distress. CARDIOVASCULAR: S1, S2 normal. No murmur   ABDOMEN: Soft, nontender, nondistended. Bowel sounds present.  EXTREMITIES: No  edema b/l.    NEUROLOGIC: nonfocal  patient is alert and awake  Condition at discharge: fair  The results of significant diagnostics from this hospitalization (including imaging, microbiology, ancillary and laboratory) are listed below for reference.   Imaging Studies: ECHOCARDIOGRAM COMPLETE  Result Date: 03/09/2023    ECHOCARDIOGRAM REPORT   Patient Name:   Lanier BOYE Date of Exam: 03/09/2023 Medical Rec #:  [id number]          Height:       64.0 in Accession #:    [id number]        Weight:       176.2 lb Date of Birth:  [birth year]          BSA:          1.854 m Patient Age:    67 years          BP:           173/68 mmHg Patient Gender: F                 HR:           62 bpm. Exam Location:  [facility] Procedure: 2D Echo, Cardiac Doppler and Color Doppler Indications:     CHF-acute diastolic I50.31  History:         Patient has no prior history of Echocardiogram examinations.                  CHF, COPD; Risk Factors:Hypertension. Tobacco use.  Sonographer:     Cristela Blue Referring Phys:  [id number] JAN A MANSY Diagnosing Phys: Lorine Bears MD IMPRESSIONS  1. Left ventricular ejection fraction, by estimation, is 60 to 65%. The left ventricle has normal function. The left ventricle has no regional wall motion abnormalities. Left ventricular diastolic parameters are consistent with Grade II diastolic dysfunction (pseudonormalization).  2. Right ventricular systolic function is normal. The right ventricular size is normal. Tricuspid regurgitation signal is inadequate for assessing PA pressure.  3. Left atrial size was mildly dilated.  4. The mitral valve is abnormal. No evidence of mitral valve regurgitation. Mild mitral stenosis. The mean mitral valve gradient is 6.0 mmHg. Moderate mitral annular calcification.  5. The aortic valve is normal in structure. Aortic valve regurgitation is not visualized. Mild aortic valve stenosis. Aortic valve mean gradient measures 9.7 mmHg.  6. The inferior vena cava is normal in size with greater than 50% respiratory variability, suggesting right atrial pressure of 3 mmHg. FINDINGS  Left Ventricle: Left ventricular ejection fraction, by estimation, is 60 to 65%. The left ventricle has normal function. The left ventricle has no regional wall motion abnormalities. The left ventricular internal cavity size was normal in size. There is  no left ventricular hypertrophy. Left ventricular diastolic parameters are  consistent with Grade II diastolic dysfunction (pseudonormalization). Right Ventricle: The right ventricular size is normal. No increase in right ventricular wall thickness. Right ventricular systolic function is normal. Tricuspid regurgitation signal is inadequate for assessing PA pressure. The tricuspid regurgitant velocity is 2.26 m/s, and with an assumed right atrial pressure of 3 mmHg, the estimated right ventricular systolic pressure is 23.4 mmHg. Left Atrium: Left atrial size was mildly dilated. Right Atrium: Right atrial size was normal in size.  Pericardium: There is no evidence of pericardial effusion. Mitral Valve: The mitral valve is abnormal. Moderate mitral annular calcification. No evidence of mitral valve regurgitation. Mild mitral valve stenosis. MV peak gradient, 13.4 mmHg. The mean mitral valve gradient is 6.0 mmHg. Tricuspid Valve: The tricuspid valve is normal in structure. Tricuspid valve regurgitation is not demonstrated. No evidence of tricuspid stenosis. Aortic Valve: The aortic valve is normal in structure. Aortic valve regurgitation is not visualized. Mild aortic stenosis is present. Aortic valve mean gradient measures 9.7 mmHg. Aortic valve peak gradient measures 19.0 mmHg. Aortic valve area, by VTI measures 2.57 cm. Pulmonic Valve: The pulmonic valve was normal in structure. Pulmonic valve regurgitation is not visualized. No evidence of pulmonic stenosis. Aorta: The aortic root is normal in size and structure. Venous: The inferior vena cava is normal in size with greater than 50% respiratory variability, suggesting right atrial pressure of 3 mmHg. IAS/Shunts: No atrial level shunt detected by color flow Doppler.  LEFT VENTRICLE PLAX 2D LVIDd:         4.20 cm   Diastology LVIDs:         3.00 cm   LV e' medial:    6.20 cm/s LV PW:         1.10 cm   LV E/e' medial:  26.1 LV IVS:        1.00 cm   LV e' lateral:   8.27 cm/s LVOT diam:     2.00 cm   LV E/e' lateral: 19.6 LV SV:         107 LV  SV Index:   58 LVOT Area:     3.14 cm  RIGHT VENTRICLE RV Basal diam:  2.60 cm RV Mid diam:    2.60 cm RV S prime:     16.20 cm/s TAPSE (M-mode): 2.2 cm LEFT ATRIUM             Index        RIGHT ATRIUM           Index LA diam:        3.80 cm 2.05 cm/m   RA Area:     11.80 cm LA Vol (A2C):   41.6 ml 22.44 ml/m  RA Volume:   23.10 ml  12.46 ml/m LA Vol (A4C):   50.4 ml 27.19 ml/m LA Biplane Vol: 47.4 ml 25.57 ml/m  AORTIC VALVE AV Area (Vmax):    2.06 cm AV Area (Vmean):   2.26 cm AV Area (VTI):     2.57 cm AV Vmax:           217.67 cm/s AV Vmean:          145.667 cm/s AV VTI:            0.418 m AV Peak Grad:      19.0 mmHg AV Mean Grad:      9.7 mmHg LVOT Vmax:         143.00 cm/s LVOT Vmean:        105.000 cm/s LVOT VTI:          0.342 m LVOT/AV VTI ratio: 0.82  AORTA Ao Root diam: 2.60 cm MITRAL VALVE                TRICUSPID VALVE MV Area (PHT): 2.76 cm     TR Peak grad:   20.4 mmHg MV Area VTI:   1.89 cm     TR Vmax:        226.00  cm/s MV Peak grad:  13.4 mmHg MV Mean grad:  6.0 mmHg     SHUNTS MV Vmax:       1.83 m/s     Systemic VTI:  0.34 m MV Vmean:      111.0 cm/s   Systemic Diam: 2.00 cm MV Decel Time: 275 msec MV E velocity: 162.00 cm/s MV A velocity: 142.00 cm/s MV E/A ratio:  1.14 Lorine Bears MD Electronically signed by Lorine Bears MD Signature Date/Time: 03/09/2023/2:18:53 PM    Final    DG Chest Port 1 View  Result Date: 03/08/2023 CLINICAL DATA:  Shortness of breath EXAM: PORTABLE CHEST 1 VIEW COMPARISON:  07/14/2022 FINDINGS: Cardiac shadow is prominent. Aortic calcifications are seen. Increasing vascular congestion is noted with mild interstitial edema. Basilar atelectatic changes are noted as well. No bony abnormality is seen. IMPRESSION: Changes of mild CHF. Electronically Signed   By: Alcide Clever M.D.   On: 03/08/2023 23:38    Microbiology: Results for orders placed or performed during the hospital encounter of 03/08/23  Resp panel by RT-PCR (RSV, Flu A&B, Covid)  Anterior Nasal Swab     Status: None   Collection Time: 03/08/23 11:25 PM   Specimen: Anterior Nasal Swab  Result Value Ref Range Status   SARS Coronavirus 2 by RT PCR NEGATIVE NEGATIVE Final    Comment: (NOTE) SARS-CoV-2 target nucleic acids are NOT DETECTED.  The SARS-CoV-2 RNA is generally detectable in upper respiratory specimens during the acute phase of infection. The lowest concentration of SARS-CoV-2 viral copies this assay can detect is 138 copies/mL. A negative result does not preclude SARS-Cov-2 infection and should not be used as the sole basis for treatment or other patient management decisions. A negative result may occur with  improper specimen collection/handling, submission of specimen other than nasopharyngeal swab, presence of viral mutation(s) within the areas targeted by this assay, and inadequate number of viral copies(<138 copies/mL). A negative result must be combined with clinical observations, patient history, and epidemiological information. The expected result is Negative.  Fact Sheet for Patients:  BloggerCourse.com  Fact Sheet for Healthcare Providers:  SeriousBroker.it  This test is no t yet approved or cleared by the [place] FDA and  has been authorized for detection and/or diagnosis of SARS-CoV-2 by FDA under an Emergency Use Authorization (EUA). This EUA will remain  in effect (meaning this test can be used) for the duration of the COVID-19 declaration under Section 564(b)(1) of the Act, 21 U.S.C.section 360bbb-3(b)(1), unless the authorization is terminated  or revoked sooner.       Influenza A by PCR NEGATIVE NEGATIVE Final   Influenza B by PCR NEGATIVE NEGATIVE Final    Comment: (NOTE) The Xpert Xpress SARS-CoV-2/FLU/RSV plus assay is intended as an aid in the diagnosis of influenza from Nasopharyngeal swab specimens and should not be used as a sole basis for treatment. Nasal washings  and aspirates are unacceptable for Xpert Xpress SARS-CoV-2/FLU/RSV testing.  Fact Sheet for Patients: BloggerCourse.com  Fact Sheet for Healthcare Providers: SeriousBroker.it  This test is not yet approved or cleared by the [place] FDA and has been authorized for detection and/or diagnosis of SARS-CoV-2 by FDA under an Emergency Use Authorization (EUA). This EUA will remain in effect (meaning this test can be used) for the duration of the COVID-19 declaration under Section 564(b)(1) of the Act, 21 U.S.C. section 360bbb-3(b)(1), unless the authorization is terminated or revoked.     Resp Syncytial Virus by PCR NEGATIVE NEGATIVE Final  Comment: (NOTE) Fact Sheet for Patients: BloggerCourse.com  Fact Sheet for Healthcare Providers: SeriousBroker.it  This test is not yet approved or cleared by the [place] FDA and has been authorized for detection and/or diagnosis of SARS-CoV-2 by FDA under an Emergency Use Authorization (EUA). This EUA will remain in effect (meaning this test can be used) for the duration of the COVID-19 declaration under Section 564(b)(1) of the Act, 21 U.S.C. section 360bbb-3(b)(1), unless the authorization is terminated or revoked.  Performed at [facility], [address] [place], [place] [address]     Labs: CBC: Recent Labs  Lab 03/08/23 2325 03/09/23 0648  WBC 9.2 6.8  NEUTROABS 7.5  --   HGB 10.9* 10.1*  HCT 37.3 34.2*  MCV 84.2 83.0  PLT 229 227   Basic Metabolic Panel: Recent Labs  Lab 03/08/23 2325 03/09/23 0648 03/10/23 1337  NA 139 139 135  K 4.3 4.4 2.8*  CL 103 101 93*  CO2 26 26 32  GLUCOSE 147* 103* 150*  BUN 50* 50* 52*  CREATININE 2.01* 1.91* 2.12*  CALCIUM 8.9 8.8* 8.4*   Liver Function Tests: Recent Labs  Lab 03/08/23 2325  AST 15  ALT 21  ALKPHOS 80  BILITOT 0.9  PROT 7.2  ALBUMIN 3.4*     Discharge time spent: greater than 30 minutes.  Signed: Enedina Finner, MD [facility] 03/10/2023

## 2023-03-10 NOTE — Progress Notes (Signed)
Heart Failure Stewardship Pharmacy Note  PCP: Danella Penton, MD PCP-Cardiologist: Lorine Bears, MD  HPI: Misty Adams is a 67 y.o. female with COPD, CHF, hypertension, dyslipidemia, hypothyroidism and peripheral vascular disease who presented with worsening dyspnea, orthopnea, and lower extremity edema. Symptoms began during a [place] cruise and worsened upon returning home. Patient was hypoxic and hypertensive on admission requiring BIPAP. BNP was elevated to 710.1. HS-troponin was 18 on admission. CXR noted increasing vascular congestion and mild interstitial edema. Echo showed LVEF of 60-65%, grade II diastolic dysfunction, mild AS.   Pertinent Lab Values: Creatinine  Date Value Ref Range Status  01/21/2023 1.38 (H) 0.44 - 1.00 mg/dL Final  16/01/9603 5.40 0.60 - 1.30 mg/dL Final   Creatinine, Ser  Date Value Ref Range Status  03/09/2023 1.91 (H) 0.44 - 1.00 mg/dL Final   BUN  Date Value Ref Range Status  03/09/2023 50 (H) 8 - 23 mg/dL Final  98/02/9146 15 7 - 18 mg/dL Final   Potassium  Date Value Ref Range Status  03/09/2023 4.4 3.5 - 5.1 mmol/L Final    Comment:    HEMOLYSIS AT THIS LEVEL MAY AFFECT RESULT  09/28/2013 3.9 3.5 - 5.1 mmol/L Final   Sodium  Date Value Ref Range Status  03/09/2023 139 135 - 145 mmol/L Final  09/28/2013 133 (L) 136 - 145 mmol/L Final   B Natriuretic Peptide  Date Value Ref Range Status  03/08/2023 710.1 (H) 0.0 - 100.0 pg/mL Final    Comment:    Performed at [facility], [address] [place], [place] [address]   Magnesium  Date Value Ref Range Status  09/20/2020 1.8 1.7 - 2.4 mg/dL Final    Comment:    Performed at [facility], [address] [place], [place] [address]   Hgb A1c MFr Bld  Date Value Ref Range Status  07/15/2022 7.3 (H) 4.8 - 5.6 % Final    Comment:    (NOTE)         Prediabetes: 5.7 - 6.4         Diabetes: >6.4         Glycemic control for adults with diabetes: <7.0    LDH   Date Value Ref Range Status  12/08/2022 109 98 - 192 U/L Final    Comment:    Performed at [facility], [address] [place], [place] [address]    Vital Signs: Admission weight: none Temp:  [98 F (36.7 C)-98.3 F (36.8 C)] 98.2 F (36.8 C) (11/19 0422) Pulse Rate:  [52-70] 70 (11/19 0400) Resp:  [14-24] 16 (11/19 0400) BP: (143-176)/(44-71) 172/71 (11/19 0400) SpO2:  [92 %-98 %] 92 % (11/19 0400) No intake or output data in the 24 hours ending 03/10/23 0714  Current Heart Failure Medications:  Loop diuretic: furosemide 80 mg IV BID Beta-Blocker: carvedilol 3.125 mg BID ACEI/ARB/ARNI: none MRA: none SGLT2i: none  Prior to admission Heart Failure Medications:  Loop diuretic: furosemide 20 mg daily + Kcl 10 meq daily Beta-Blocker: propranolol 20 mg BID ACEI/ARB/ARNI: none MRA: none SGLT2i: none Other vasoactive medications include hydrochlorothiazide   Assessment: 1. Acute diastolic heart failure (LVEF 60-65%) with grade II diastolic dysfunction, due to unknown etiology. NYHA class II-III symptoms.  -Symptoms: Reports shortness of breath and LEE are improved after diuresis. Reported orthopnea prior to admission, but has not attempted to lay flat since.  -Volume: Appears euvolemic today. Creatinine bumped along with BUN after furosemide. Can consider transition to oral diuretics  after holding a day. -Hemodynamics: BP has been significantly elevated since admission, ranging from 140-170s/60s. Would beenfit from afterload reduction. -BB: Although BB are not recommended for HFpEF, agree with swapping from propranolol carvedilol. Patuient has marked hypertension so will leave for now. -ACEI/ARB/ARNI: Would benefit from the addition of an ARB or Entresto for marked hypertension, however creatinine is significantly elevated today.  -MRA: Hesitant to add MRA this admission due to creaitnine ~2. May be a better candidate for finerenone outpatient if creatinine improves.   -SGLT2i: Consider adding as first line therapy for HFpEF prior to discharge if creatinine permits. Current eGFR is still within parameters to initiate.   Plan: 1) Medication changes recommended at this time: -Consider adding Farxiga 10 mg daily if creatinine is stable tomorrow morning or if discharged at first follow up pending creatinine trend.  2) Patient assistance: -Pending  - Patient has been educated on current HF medications and potential additions to HF medication regimen - Patient verbalizes understanding that over the next few months, these medication doses may change and more medications may be added to optimize HF regimen - Patient has been educated on basic disease state pathophysiology and goals of therapy  Please do not hesitate to reach out with questions or concerns,  Enos Fling, PharmD, CPP, BCPS Heart Failure Pharmacist  Phone - [phone] 03/10/2023 4:09 PM

## 2023-03-10 NOTE — Telephone Encounter (Signed)
Patient Product/process development scientist completed.    The patient is insured through [employer]. Patient has Medicare and is not eligible for a copay card, but may be able to apply for patient assistance, if available.    Ran test claim for Entresto 24-26 mg and the current 30 day co-pay is $45.00.  Ran test claim for Farxiga 10 mg and the current 30 day co-pay is $45.00.  Ran test claim for Jardiane 10 mg and the current 30 day co-pay is $45.00.  This test claim was processed through [facility]- copay amounts may vary at other pharmacies due to pharmacy/plan contracts, or as the patient moves through the different stages of their insurance plan.     Roland Earl, CPHT Pharmacy Technician III Certified Patient Advocate [facility] Pharm[facility] Patient Advocate Team Direct Number: [phone]  Fax: [phone]

## 2023-03-10 NOTE — Telephone Encounter (Signed)
Patient Product/process development scientist completed.    The patient is insured through [employer]. Patient has Medicare and is not eligible for a copay card, but may be able to apply for patient assistance, if available.    Ran test claim for Kerendia 20 mg and Requires Prior Authorization   This test claim was processed through [employer]- copay amounts may vary at other pharmacies due to [employer], or as the patient moves through the different stages of their insurance plan.     Roland Earl, CPHT Pharmacy Technician III Certified Patient Advocate [facility] Pharmacy Patient Advocate Team Direct Number: [phone]  Fax: [phone]

## 2023-03-10 NOTE — Progress Notes (Addendum)
[facility]  - [place] at [facility]   PATIENT NAME: Misty Adams    MR#:  [id number]  DATE OF BIRTH:  28-Apr-1955  SUBJECTIVE:  patient feels better. She normally uses 2 L nasal cannula oxygen. Slept well overnight. Diuresing  well per patient although urine output not documented in the ER. Patient wondering if she can go home. No family at bedside.  VITALS:  Blood pressure (!) 178/69, pulse 79, temperature 98.1 F (36.7 C), temperature source Oral, resp. rate 15, height 5\' 4"  (1.626 m), SpO2 96%.  PHYSICAL EXAMINATION:   GENERAL:  67 y.o.-year-old patient with no acute distress. Appears chronically ill LUNGS: decreased breath sounds bilaterally, no wheezing no respiratory distress. CARDIOVASCULAR: S1, S2 normal. No murmur   ABDOMEN: Soft, nontender, nondistended. Bowel sounds present.  EXTREMITIES: No  edema b/l.    NEUROLOGIC: nonfocal  patient is alert and awake  LABORATORY PANEL:  CBC Recent Labs  Lab 03/09/23 0648  WBC 6.8  HGB 10.1*  HCT 34.2*  PLT 227    Chemistries  Recent Labs  Lab 03/08/23 2325 03/09/23 0648  NA 139 139  K 4.3 4.4  CL 103 101  CO2 26 26  GLUCOSE 147* 103*  BUN 50* 50*  CREATININE 2.01* 1.91*  CALCIUM 8.9 8.8*  AST 15  --   ALT 21  --   ALKPHOS 80  --   BILITOT 0.9  --     RADIOLOGY:  ECHOCARDIOGRAM COMPLETE  Result Date: 03/09/2023    ECHOCARDIOGRAM REPORT   Patient Name:   Misty Adams Date of Exam: 03/09/2023 Medical Rec #:  [id number]         Height:       64.0 in Accession #:    [id number]        Weight:       176.2 lb Date of Birth:  1956/02/24          BSA:          1.854 m Patient Age:    67 years          BP:           173/68 mmHg Patient Gender: F                 HR:           62 bpm. Exam Location:  [facility] Procedure: 2D Echo, Cardiac Doppler and Color Doppler Indications:     CHF-acute diastolic I50.31  History:         Patient has no prior history of Echocardiogram examinations.                  CHF,  COPD; Risk Factors:Hypertension. Tobacco use.  Sonographer:     Cristela Blue Referring Phys:  [id number] JAN A MANSY Diagnosing Phys: Lorine Bears MD IMPRESSIONS  1. Left ventricular ejection fraction, by estimation, is 60 to 65%. The left ventricle has normal function. The left ventricle has no regional wall motion abnormalities. Left ventricular diastolic parameters are consistent with Grade II diastolic dysfunction (pseudonormalization).  2. Right ventricular systolic function is normal. The right ventricular size is normal. Tricuspid regurgitation signal is inadequate for assessing PA pressure.  3. Left atrial size was mildly dilated.  4. The mitral valve is abnormal. No evidence of mitral valve regurgitation. Mild mitral stenosis. The mean mitral valve gradient is 6.0 mmHg. Moderate mitral annular calcification.  5. The aortic valve is normal in structure. Aortic valve regurgitation is  not visualized. Mild aortic valve stenosis. Aortic valve mean gradient measures 9.7 mmHg.  6. The inferior vena cava is normal in size with greater than 50% respiratory variability, suggesting right atrial pressure of 3 mmHg. FINDINGS  Left Ventricle: Left ventricular ejection fraction, by estimation, is 60 to 65%. The left ventricle has normal function. The left ventricle has no regional wall motion abnormalities. The left ventricular internal cavity size was normal in size. There is  no left ventricular hypertrophy. Left ventricular diastolic parameters are consistent with Grade II diastolic dysfunction (pseudonormalization). Right Ventricle: The right ventricular size is normal. No increase in right ventricular wall thickness. Right ventricular systolic function is normal. Tricuspid regurgitation signal is inadequate for assessing PA pressure. The tricuspid regurgitant velocity is 2.26 m/s, and with an assumed right atrial pressure of 3 mmHg, the estimated right ventricular systolic pressure is 23.4 mmHg. Left Atrium: Left atrial  size was mildly dilated. Right Atrium: Right atrial size was normal in size. Pericardium: There is no evidence of pericardial effusion. Mitral Valve: The mitral valve is abnormal. Moderate mitral annular calcification. No evidence of mitral valve regurgitation. Mild mitral valve stenosis. MV peak gradient, 13.4 mmHg. The mean mitral valve gradient is 6.0 mmHg. Tricuspid Valve: The tricuspid valve is normal in structure. Tricuspid valve regurgitation is not demonstrated. No evidence of tricuspid stenosis. Aortic Valve: The aortic valve is normal in structure. Aortic valve regurgitation is not visualized. Mild aortic stenosis is present. Aortic valve mean gradient measures 9.7 mmHg. Aortic valve peak gradient measures 19.0 mmHg. Aortic valve area, by VTI measures 2.57 cm. Pulmonic Valve: The pulmonic valve was normal in structure. Pulmonic valve regurgitation is not visualized. No evidence of pulmonic stenosis. Aorta: The aortic root is normal in size and structure. Venous: The inferior vena cava is normal in size with greater than 50% respiratory variability, suggesting right atrial pressure of 3 mmHg. IAS/Shunts: No atrial level shunt detected by color flow Doppler.  LEFT VENTRICLE PLAX 2D LVIDd:         4.20 cm   Diastology LVIDs:         3.00 cm   LV e' medial:    6.20 cm/s LV PW:         1.10 cm   LV E/e' medial:  26.1 LV IVS:        1.00 cm   LV e' lateral:   8.27 cm/s LVOT diam:     2.00 cm   LV E/e' lateral: 19.6 LV SV:         107 LV SV Index:   58 LVOT Area:     3.14 cm  RIGHT VENTRICLE RV Basal diam:  2.60 cm RV Mid diam:    2.60 cm RV S prime:     16.20 cm/s TAPSE (M-mode): 2.2 cm LEFT ATRIUM             Index        RIGHT ATRIUM           Index LA diam:        3.80 cm 2.05 cm/m   RA Area:     11.80 cm LA Vol (A2C):   41.6 ml 22.44 ml/m  RA Volume:   23.10 ml  12.46 ml/m LA Vol (A4C):   50.4 ml 27.19 ml/m LA Biplane Vol: 47.4 ml 25.57 ml/m  AORTIC VALVE AV Area (Vmax):    2.06 cm AV Area (Vmean):    2.26 cm AV Area (VTI):  2.57 cm AV Vmax:           217.67 cm/s AV Vmean:          145.667 cm/s AV VTI:            0.418 m AV Peak Grad:      19.0 mmHg AV Mean Grad:      9.7 mmHg LVOT Vmax:         143.00 cm/s LVOT Vmean:        105.000 cm/s LVOT VTI:          0.342 m LVOT/AV VTI ratio: 0.82  AORTA Ao Root diam: 2.60 cm MITRAL VALVE                TRICUSPID VALVE MV Area (PHT): 2.76 cm     TR Peak grad:   20.4 mmHg MV Area VTI:   1.89 cm     TR Vmax:        226.00 cm/s MV Peak grad:  13.4 mmHg MV Mean grad:  6.0 mmHg     SHUNTS MV Vmax:       1.83 m/s     Systemic VTI:  0.34 m MV Vmean:      111.0 cm/s   Systemic Diam: 2.00 cm MV Decel Time: 275 msec MV E velocity: 162.00 cm/s MV A velocity: 142.00 cm/s MV E/A ratio:  1.14 Lorine Bears MD Electronically signed by Lorine Bears MD Signature Date/Time: 03/09/2023/2:18:53 PM    Final    DG Chest Port 1 View  Result Date: 03/08/2023 CLINICAL DATA:  Shortness of breath EXAM: PORTABLE CHEST 1 VIEW COMPARISON:  07/14/2022 FINDINGS: Cardiac shadow is prominent. Aortic calcifications are seen. Increasing vascular congestion is noted with mild interstitial edema. Basilar atelectatic changes are noted as well. No bony abnormality is seen. IMPRESSION: Changes of mild CHF. Electronically Signed   By: Alcide Clever M.D.   On: 03/08/2023 23:38    Assessment and Plan  Misty Adams is a 66 y.o. Caucasian female with medical history significant for COPD, CHF, hypertension, dyslipidemia, hypothyroidism and peripheral vascular disease, who presented to the emergency room with acute onset of worsening dyspnea  BNP was 710  chest x-ray showed acute CHF.   Patient initially was placed on BiPAP now on nasal cannula 4 L. Patient continues to smoke heavily at home.  Acute hypoxic respiratory failure secondary to congestive heart failure/pulmonary edema requiring BiPAP now on nasal cannula chronic respiratory failure/COPD/chronic smoker/chronic home oxygen 2  L -- IV Lasix 80 mg BID -- appreciate [facility] cardiology input. -- Low-dose Coreg-- increased 12.5 mg BID given elevated pressures  Acute on chronic diastolic CHF (congestive heart failure) (HCC) elevated troponin appears due to demand ischemia. -  diuresis with IV Lasix. -- Patient denies any chest pain - 2D echo. Reviewed   Dyslipidemia -  continue statin therapy.   Hypothyroidism - continue Synthroid.   GERD without esophagitis - continue PPI therapy.   Anxiety and depression -- continue Effexor XR, trazodone and Restoril as well as Xanax.   Essential hypertension - Continue antihypertensive therapy-- Coreg and added amlodipine per cardiology recommendation  CKD stage IIIB acute on chronic -- baseline creatinine around 1.4-- 1.9 -- came in with creatinine of 2.01---> 1.9  Ambulate with mobility therapist    Family communication : patient's family has been informed by her Consults : [facility] cardiology CODE STATUS: full DVT Prophylaxis : enoxaparin Level of care: Progressive Status is: Inpatient Remains inpatient appropriate  because: CHF management. Await further cardiology recommendation and decision for discharge planning    TOTAL TIME TAKING CARE OF THIS PATIENT: 35 minutes.  >50% time spent on counselling and coordination of care  Note: This dictation was prepared with Dragon dictation along with smaller phrase technology. Any transcriptional errors that result from this process are unintentional.  Enedina Finner M.D    [facility]   CC: Primary care physician; Danella Penton, MD

## 2023-03-10 NOTE — Progress Notes (Signed)
Cardiology Progress Note   Patient Name: Misty Adams Date of Encounter: 03/10/2023  Primary Cardiologist: Lorine Bears, MD  Subjective   Breathing improved this morning.  Notes good output with intravenous Lasix, though I's and O's are inaccurate.  No chest pain. Objective   Inpatient Medications    Scheduled Meds:  amLODipine  5 mg Oral Daily   aspirin EC  81 mg Oral QHS   atorvastatin  40 mg Oral QHS   carvedilol  12.5 mg Oral BID WC   enoxaparin (LOVENOX) injection  40 mg Subcutaneous Q24H   gabapentin  200 mg Oral QHS   levothyroxine  125 mcg Oral Q0600   pantoprazole  40 mg Oral BID   potassium chloride  40 mEq Oral BID   venlafaxine XR  150 mg Oral Daily   Continuous Infusions:  PRN Meds: acetaminophen **OR** acetaminophen, albuterol, ALPRAZolam, guaiFENesin-dextromethorphan, magnesium hydroxide, nitroGLYCERIN, ondansetron (ZOFRAN) IV, ondansetron, traZODone   Vital Signs    Vitals:   03/10/23 1200 03/10/23 1230 03/10/23 1300 03/10/23 1345  BP: (!) 178/69     Pulse: 79 70 69 65  Resp: 15 18 19 18   Temp:      TempSrc:      SpO2: 96% 97% 96% 96%  Height:       No intake or output data in the 24 hours ending 03/10/23 1530 There were no vitals filed for this visit.  Physical Exam   GEN: Well nourished, well developed, in no acute distress.  HEENT: Grossly normal.  Neck: Supple, no JVD, carotid bruits, or masses. Cardiac: RRR, no murmurs, rubs, or gallops. No clubbing, cyanosis, edema.  Radials 2+, DP/PT 2+ and equal bilaterally.  Respiratory:  Respirations regular and unlabored, scattered rhonchi with faint inspiratory/expiratory wheezing. GI: Soft, nontender, nondistended, BS + x 4. MS: no deformity or atrophy. Skin: warm and dry, no rash. Neuro:  Strength and sensation are intact. Psych: AAOx3.  Normal affect.  Labs    Chemistry Recent Labs  Lab 03/08/23 2325 03/09/23 0648 03/10/23 1337  NA 139 139 135  K 4.3 4.4 2.8*  CL 103 101 93*   CO2 26 26 32  GLUCOSE 147* 103* 150*  BUN 50* 50* 52*  CREATININE 2.01* 1.91* 2.12*  CALCIUM 8.9 8.8* 8.4*  PROT 7.2  --   --   ALBUMIN 3.4*  --   --   AST 15  --   --   ALT 21  --   --   ALKPHOS 80  --   --   BILITOT 0.9  --   --   GFRNONAA 26* 28* 25*  ANIONGAP 10 12 10      Hematology Recent Labs  Lab 03/08/23 2325 03/09/23 0648  WBC 9.2 6.8  RBC 4.43 4.12  HGB 10.9* 10.1*  HCT 37.3 34.2*  MCV 84.2 83.0  MCH 24.6* 24.5*  MCHC 29.2* 29.5*  RDW 20.4* 20.3*  PLT 229 227    Cardiac Enzymes  Recent Labs  Lab 03/08/23 2325 03/09/23 0648  TROPONINIHS 18* 38*      BNP    Component Value Date/Time   BNP 710.1 (H) 03/08/2023 2325    HbA1c  Lab Results  Component Value Date   HGBA1C 7.3 (H) 07/15/2022    Radiology    DG Chest Port 1 View  Result Date: 03/08/2023 CLINICAL DATA:  Shortness of breath EXAM: PORTABLE CHEST 1 VIEW COMPARISON:  07/14/2022 FINDINGS: Cardiac shadow is prominent. Aortic calcifications are seen. Increasing vascular  congestion is noted with mild interstitial edema. Basilar atelectatic changes are noted as well. No bony abnormality is seen. IMPRESSION: Changes of mild CHF. Electronically Signed   By: Alcide Clever M.D.   On: 03/08/2023 23:38     Telemetry    Regular sinus rhythm- Personally Reviewed  Cardiac Studies   2D Echocardiogram 11.18.2024   1. Left ventricular ejection fraction, by estimation, is 60 to 65%. The  left ventricle has normal function. The left ventricle has no regional  wall motion abnormalities. Left ventricular diastolic parameters are  consistent with Grade II diastolic  dysfunction (pseudonormalization).   2. Right ventricular systolic function is normal. The right ventricular  size is normal. Tricuspid regurgitation signal is inadequate for assessing  PA pressure.   3. Left atrial size was mildly dilated.   4. The mitral valve is abnormal. No evidence of mitral valve  regurgitation. Mild mitral  stenosis. The mean mitral valve gradient is 6.0  mmHg. Moderate mitral annular calcification.   5. The aortic valve is normal in structure. Aortic valve regurgitation is  not visualized. Mild aortic valve stenosis. Aortic valve mean gradient  measures 9.7 mmHg.   6. The inferior vena cava is normal in size with greater than 50%  respiratory variability, suggesting right atrial pressure of 3 mmHg. _____________   Patient Profile     67 y.o. female w/ a h/o HTN, PAD s/p L subclavian stenting (07/2017), DM, HL, tob abuse, COPD, ILD, pulm nodules, Ao atherosclerosis, CKD III, anxiety, depression, anemia, and hypothyroidism, who was admitted November 17 with acute on chronic heart failure.  Assessment & Plan    1.  Acute on chronic heart failure with preserved ejection fraction: Patient presented with progressive dyspnea and acute worsening in the setting of hypertension on presentation on November 17.  Echo this admission showed EF of 60 to 65% with grade 2 diastolic dysfunction, mild AAS and MS.  She says that she responded well to intravenous Lasix though in and out measurements are not recorded.  Breathing significantly improved and she is lying flat today.  She does not appear to be significantly volume overloaded on examination.  Lab work just returned and notable for elevations in BUN and creatinine to 52 and 2.12 with a bicarb of 32.  She is also hypokalemic at 2.8.  She did receive Lasix this morning.  Hold Lasix and supplement potassium.  Blood pressure elevated and her carvedilol has already been increased today with addition of amlodipine.  Patient is eager to go home.  If so, I have arranged for a follow-up basic metabolic panel on Thursday and office follow-up next Tuesday.  Will likely need to hold diuretics until we see a follow-up basic metabolic panel on Thursday, at which time we can hopefully resume her home furosemide.  2.  Acute on chronic stage III kidney disease: As above,  creatinine to 2.12.  Lasix on hold.  If discharged, plan for follow-up basic metabolic panel on Thursday.  3.  Hypokalemia: Potassium 2.8.  Supplementation ordered.  4.  Demand ischemia: In the setting of above, troponin rose to 38 following admission.  No chest pain but does have progressive dyspnea on exertion in the outpatient setting.  Echo showed normal LV function without wall motion abnormalities and mild AS and MS.  As previously noted, she would benefit from outpatient ischemic evaluation, which we can arrange at follow-up next week (Lexiscan PET/CT).  Continue aspirin, statin, beta-blocker.  5.  Hypertension/hypertensive urgency: Blood pressure elevated  this morning.  We added carvedilol yesterday, which was titrated earlier today.  Amlodipine is also been ordered.  Will follow-up as outpatient.  6.  Hyperlipidemia: Continue statin therapy.  7.  Tobacco abuse/COPD/interstitial lung disease/chronic hypoxic respiratory failure requiring supplemental oxygen: Breathing improved with diuresis.  Complete tobacco cessation advised. Signed, Nicolasa Ducking, NP  03/10/2023, 3:30 PM    For questions or updates, please contact   Please consult [email or web address] for contact info under Cardiology/STEMI.

## 2023-03-10 NOTE — Progress Notes (Signed)
Heart Failure Nurse Navigator Progress Note  PCP: Danella Penton, MD PCP-Cardiologist: Lorine Bears, MD Admission Diagnosis:  Acute congestive heart failure, unspecified heart failure type Respiratory Distress Acute hypoxemic respiratory failure  Admitted from: Home  Presentation:   Misty Adams presented with worsening dyspnea, orthopnea, dry cough, occasional wheezing and lower extremity edema. Symptoms began during a [place] cruise and worsened upon returning home. Patient was hypoxic and hypertensive on admission requiring BIPAP. BNP 710.1. Troponin 18. CXR with increasing vascular congestion and mild interstitial edema.  ECHO/ LVEF: 60-65%  Clinical Course:  Past Medical History:  Diagnosis Date   Allergy    Anxiety    CHF (congestive heart failure) (HCC)    COPD (chronic obstructive pulmonary disease) (HCC)    Depression    Diabetes mellitus without complication (HCC)    Hyperlipidemia    Hypertension    Hypothyroidism    PAD (peripheral artery disease) (HCC)    Psoriasis    Substance abuse (HCC)    Tobacco abuse      Social History   Socioeconomic History   Marital status: Divorced    Spouse name: Not on file   Number of children: 5   Years of education: Not on file   Highest education level: Associate degree: academic program  Occupational History   Occupation: Retired  Tobacco Use   Smoking status: Every Day    Current packs/day: 2.00    Average packs/day: 2.0 packs/day for 50.0 years (100.0 ttl pk-yrs)    Types: Cigarettes   Smokeless tobacco: Never  Vaping Use   Vaping status: Former   Substances: Nicotine  Substance and Sexual Activity   Alcohol use: Yes   Drug use: Not Currently   Sexual activity: Not Currently  Other Topics Concern   Not on file  Social History Narrative   Lives locally by herself.  Sedentary.   Social Determinants of Health   Financial Resource Strain: Low Risk  (03/10/2023)   Overall Financial Resource Strain  (CARDIA)    Difficulty of Paying Living Expenses: Not hard at all  Food Insecurity: No Food Insecurity (03/09/2023)   Hunger Vital Sign    Worried About Running Out of Food in the Last Year: Never true    Ran Out of Food in the Last Year: Never true  Transportation Needs: No Transportation Needs (03/10/2023)   PRAPARE - Administrator, Civil Service (Medical): No    Lack of Transportation (Non-Medical): No  Physical Activity: Not on file  Stress: Not on file  Social Connections: Not on file   Education Assessment and Provision:  Detailed education and instructions provided on heart failure disease management including the following:  Signs and symptoms of Heart Failure When to call the physician Importance of daily weights Low sodium diet Fluid restriction Medication management Anticipated future follow-up appointments-HF TOC scheduled  Patient education given on each of the above topics.  Patient acknowledges understanding via teach back method and acceptance of all instructions.  Education Materials:  "Living Better With Heart Failure" Booklet, HF zone tool, & Daily Weight Tracker Tool.  Patient has scale at home: Yes Patient has pill box at home: Yes    High Risk Criteria for Readmission and/or Poor Patient Outcomes: Heart failure hospital admissions (last 6 months): 1  No Show rate: 13 Difficult social situation: None Demonstrates medication adherence: Yes Primary Language: English Literacy level: Reading, Writing & Comprehension  Barriers of Care:   Diet & Fluid Restrictions Daily Weights  Medication Compliance  Considerations/Referrals:   Referral made to Heart Failure Pharmacist Stewardship: Yes Referral made to CSW/NCM TOC: No Referral made to Heart & Vascular TOC clinic: Yes, Follow-up scheduled for 03/16/23 @ 8:30 am. Pt understands all appointment information.  Items for Follow-up on DC/TOC: Diet & Fluid Restrictions Daily Weights Medication  Compliance Continued Heart Failure Education  Roxy Horseman, RN, BSN [facility] Heart Failure Navigator Secure Chat Only

## 2023-03-10 NOTE — Discharge Instructions (Addendum)
Patient advised to hold Lasix and hydrochlorothiazide till blood work is done on Thursday at the medical [facility] await advice from cardiology nurse practitioner.  Take your new prescription of potassium as instructed-- 40 meq BID for three days  Hold your home prescription of potassium for now  use your oxygen, nebulizer, inhalers as before

## 2023-03-11 ENCOUNTER — Inpatient Hospital Stay: Payer: Medicare HMO

## 2023-03-12 ENCOUNTER — Inpatient Hospital Stay: Payer: Medicare HMO | Attending: Internal Medicine

## 2023-03-12 VITALS — BP 153/61 | HR 76 | Temp 97.8°F

## 2023-03-12 DIAGNOSIS — E876 Hypokalemia: Secondary | ICD-10-CM | POA: Diagnosis not present

## 2023-03-12 DIAGNOSIS — D509 Iron deficiency anemia, unspecified: Secondary | ICD-10-CM | POA: Diagnosis not present

## 2023-03-12 DIAGNOSIS — D649 Anemia, unspecified: Secondary | ICD-10-CM

## 2023-03-12 MED ORDER — IRON SUCROSE 20 MG/ML IV SOLN
200.0000 mg | Freq: Once | INTRAVENOUS | Status: AC
  Administered 2023-03-12: 200 mg via INTRAVENOUS

## 2023-03-14 NOTE — Progress Notes (Unsigned)
Cardiology Clinic Note   Date: 03/16/2023 ID: Misty Adams, DOB [birth year], MRN [id number]  Primary Cardiologist:  Lorine Bears, MD  Patient Profile    Misty Adams is a 67 y.o. female who presents to the clinic today for hospital follow up.     Past medical history significant for: Chronic HFpEF. Echo 03/09/2023: EF 60 to 65%.  No RWMA.  Grade II DD.  Normal RV size/function.  Mild LAE.  Mild mitral stenosis, mean gradient 6 mmHg.  Moderate MAC.  Mild AS, mean gradient 9.7 mmHg. Hypertension. Hyperlipidemia. Lipid panel 10/16/2022: LDL 97, HDL 58, TG 133, total 181.  Subclavian artery stenosis. Carotid duplex 07/15/2017: No evidence of stenosis left ICA.  Left subclavian artery was stenotic with disturbed flow.  Elevated velocities noted in the left proximal subclavian artery suggestive of > 50% stenosis. Aortic arch angiography 08/06/2017: Angioplasty and stenting left subclavian artery. Chronic venous insufficiency. COPD. Interstitial lung disease. GERD. Hypothyroidism. CKD stage IIIb. Tobacco abuse.  In summary, previously evaluated for chest pain in June 2011 with exercise Myoview which was without ischemia or infarct EF 79%.  In April 2019 she underwent stenting of left subclavian artery as above.  In March 2024 she was admitted to [facility] with E. coli UTI, severe sepsis, and AKI.  High-sensitivity troponin rose to 56.  She did not undergo cardiology evaluation.  In August 2024 CT chest performed secondary to weight loss showed aortic atherosclerosis, unchanged 4 mm right apical pulmonary nodule, enlarged pulmonary trunk consistent with PAH, left adrenal adenoma.  Patient presented to [facility] ED via EMS for shortness of breath on 03/08/2023 and was admitted.  She reported a history of CHF managed by PCP with hydrochlorothiazide and Lasix.  She has never seen cardiology.  She reported stable home weight.  Every 2 to 3 months patient describes episodic tightness in jaws, arms,  chest usually occurring at rest, lasting about 30 minutes, and resolving after aspirin.  She is very sedentary at home.  She continues to smoke 1 pack/day and has chronic DOE worsening over the past year.  Patient went on a [place] cruise the week prior to admission and reported feeling more dyspneic and fatigued while on vacation.  She returned home in the early a.m. on 11/17 and went to bed.  Later that morning after eating breakfast she became markedly dyspneic.  She contacted her son who is a paramedic and then EMS.  She was hypoxic in the field and started on BiPAP.  Upon arrival to ED she was afebrile and hypertensive at 175/68, 93% on BiPAP.  EKG showed sinus rhythm, 97 bpm with baseline artifact.  Initial labs: BUN 50, creatinine 2.01, hemoglobin 10.9, BNP 710.1, troponin 18.  Chest x-ray showed mild CHF.  She was treated with IV Lasix and SL NTG.  Patient was discharged on 03/10/2023.  Secondary to elevated BUN/creatinine, Lasix and potassium were held until follow-up.  Carvedilol was increased and amlodipine was added for hypertension.  Plan for outpatient ischemic evaluation.     History of Present Illness    Misty Adams is a new patient to cardiology following hospital admission.  She will be followed by Dr. Kirke Corin for the above outlined history.   Discussed the use of AI scribe software for clinical note transcription with the patient, who gave verbal consent to proceed.  The patient presents with her daughter for hospital follow-up. She reports she initially did well after discharge but began noticing lower extremity edema and increased  dyspnea. She went to her PCP for blood work and mentioned her progressive symptoms and was started on torsemide 2 days ago. Daily weight demonstrates a 9 lb weight gain since discharge with a big jump over the weekend (she was under her normal weight upon discharge). Last home weight 174.8 lb this morning. Normal weight is ~170 lb. She denies orthopnea  or PND. She sleeps with 2L supplemental O2. She does not use oxygen any other time but when she sleeps. She denies chest pain, pressure or tightness. She does restrict sodium. She was not discharged on a diuretic secondary to kidney function. Her labs from 11/21 showed improved kidney function with creatinine 1.7, eGFR 32. Her potassium was high at 5.5. She was instructed to reduce her po potassium.         ROS: All other systems reviewed and are otherwise negative except as noted in History of Present Illness.  Studies Reviewed    EKG Interpretation Date/Time:  Monday March 16 2023 10:07:40 EST Ventricular Rate:  64 PR Interval:  148 QRS Duration:  80 QT Interval:  426 QTC Calculation: 439 R Axis:   44  Text Interpretation: Normal sinus rhythm with sinus arrhythmia Normal ECG When compared with ECG of 08-Mar-2023 23:26, PREVIOUS ECG IS PRESENT Confirmed by Carlos Levering [phone]) on 03/16/2023 10:14:13 AM        Physical Exam    VS:  BP 130/66 (BP Location: Left Arm, Patient Position: Sitting, Cuff Size: Normal)   Pulse 60   Ht 5\' 4"  (1.626 m)   Wt 177 lb 9.6 oz (80.6 kg)   SpO2 91%   BMI 30.48 kg/m  , BMI Body mass index is 30.48 kg/m.  GEN: Well nourished, well developed, in no acute distress. Neck: No JVD or carotid bruits. Cardiac:  RRR. 2/6 systolic murmur. No rubs or gallops.   Respiratory:  Respirations regular and unlabored. Diminished breath sounds bilaterally without rales, wheezing or rhonchi. GI: Soft, nontender, nondistended. Extremities: Radials/DP/PT 2+ and equal bilaterally. No clubbing or cyanosis. 2+ pitting edema bilateral lower extremities.   Skin: Warm and dry, no rash. Neuro: Strength intact.  Assessment & Plan      Chronic HFpEF/DOE/Lower extremity edema Recent hospital admission.  Echo November 2024 showed EF 60 to 65%, Grade II DD, mild LAE, mild mitral stenosis/AS.  Patient reports doing well initially after discharge then noticing a  progress increase in lower extremity edema and dyspnea. She is now 9 lb up from hospital discharge (she was under her normal weight when she first arrived home). Home weight this morning 174.8 lb. She was started on torsemide 20 mg daily by PCP 2 days ago. She denies orthopnea or PND. Normally on 2 L supplemental O2 when she sleeps but no other time. CRT from 11/21 1.7 (improved from hospital admission).  2+ pitting edema bilateral lower extremities. Breath sounds diminished throughout without wheezing, rhonchi or rales. No JVD.  -Increase torsemide to 40 mg x 3 days then return to 20 mg daily thereafter.  -Continue daily weight.  -Close follow up next week. -Will repeat BMP at follow up visit.  -Continue carvedilol. Addition of SGLT2i or spironolactone limited secondary to kidney function.  -ED precautions provided.   Demand ischemia Peak high-sensitivity troponin during hospital admission 38.  Patient did not complain of chest pain but does have progressive DOE predating hospital admission.  Echo with normal LV function and no RWMA.  Patient denies chst pain, pressure or tightness.  -Plan to schedule  Lexiscan at follow up visit next week.   Hypertension BP today  130/72.  -Continue amlodipine and carvedilol.    Hyperlipidemia LDL October 2024 97, at goal. -Continue atorvastatin.  Hyperkalemia Elevated potassium on lab check 11/21 (5.5). 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  -Continue current potassium supplementation. Increase to 8 tablets tomorrow secondary to increase in diuretic. -Recheck BMP at follow up next week.          Disposition: Torsemide 40 mg x 3 days then resume 20 mg daily thereafter. Return on 12/2 for follow up or sooner as needed.          Signed, Etta Grandchild. Eboney Claybrook, DNP, NP-C

## 2023-03-15 DIAGNOSIS — I350 Nonrheumatic aortic (valve) stenosis: Secondary | ICD-10-CM | POA: Insufficient documentation

## 2023-03-16 ENCOUNTER — Encounter: Payer: Self-pay | Admitting: Student

## 2023-03-16 ENCOUNTER — Encounter: Payer: Medicare HMO | Admitting: Family

## 2023-03-16 ENCOUNTER — Ambulatory Visit: Payer: Medicare HMO | Attending: Student | Admitting: Student

## 2023-03-16 VITALS — BP 130/66 | HR 60 | Ht 64.0 in | Wt 177.6 lb

## 2023-03-16 DIAGNOSIS — E875 Hyperkalemia: Secondary | ICD-10-CM | POA: Diagnosis not present

## 2023-03-16 DIAGNOSIS — R600 Localized edema: Secondary | ICD-10-CM

## 2023-03-16 DIAGNOSIS — N179 Acute kidney failure, unspecified: Secondary | ICD-10-CM | POA: Diagnosis not present

## 2023-03-16 DIAGNOSIS — I5032 Chronic diastolic (congestive) heart failure: Secondary | ICD-10-CM

## 2023-03-16 DIAGNOSIS — F32A Depression, unspecified: Secondary | ICD-10-CM | POA: Diagnosis not present

## 2023-03-16 DIAGNOSIS — I350 Nonrheumatic aortic (valve) stenosis: Secondary | ICD-10-CM | POA: Diagnosis not present

## 2023-03-16 DIAGNOSIS — R0609 Other forms of dyspnea: Secondary | ICD-10-CM

## 2023-03-16 DIAGNOSIS — E7800 Pure hypercholesterolemia, unspecified: Secondary | ICD-10-CM

## 2023-03-16 DIAGNOSIS — I2489 Other forms of acute ischemic heart disease: Secondary | ICD-10-CM | POA: Diagnosis not present

## 2023-03-16 DIAGNOSIS — J449 Chronic obstructive pulmonary disease, unspecified: Secondary | ICD-10-CM | POA: Diagnosis not present

## 2023-03-16 DIAGNOSIS — I5021 Acute systolic (congestive) heart failure: Secondary | ICD-10-CM | POA: Diagnosis not present

## 2023-03-16 DIAGNOSIS — I10 Essential (primary) hypertension: Secondary | ICD-10-CM | POA: Diagnosis not present

## 2023-03-16 DIAGNOSIS — Z720 Tobacco use: Secondary | ICD-10-CM | POA: Diagnosis not present

## 2023-03-16 NOTE — Patient Instructions (Signed)
Medication Instructions:  Torsemide: Take 40 mg once daily for 3 days then resume the 20 mg once daily  *If you need a refill on your cardiac medications before your next appointment, please call your pharmacy*   Lab Work: None ordered If you have labs (blood work) drawn today and your tests are completely normal, you will receive your results only by: MyChart Message (if you have MyChart) OR A paper copy in the mail If you have any lab test that is abnormal or we need to change your treatment, we will call you to review the results.   Testing/Procedures: None ordered   Follow-Up: At [facility], you and your health needs are our priority.  As part of our continuing mission to provide you with exceptional heart care, we have created designated Provider Care Teams.  These Care Teams include your primary Cardiologist (physician) and Advanced Practice Providers (APPs -  Physician Assistants and Nurse Practitioners) who all work together to provide you with the care you need, when you need it.  We recommend signing up for the patient portal called "MyChart".  Sign up information is provided on this After Visit Summary.  MyChart is used to connect with patients for Virtual Visits (Telemedicine).  Patients are able to view lab/test results, encounter notes, upcoming appointments, etc.  Non-urgent messages can be sent to your provider as well.   To learn more about what you can do with MyChart, go to [email or web address].    Your next appointment:   Keep your follow up on 03/23/23 at 8 am.

## 2023-03-17 ENCOUNTER — Encounter: Payer: Self-pay | Admitting: Internal Medicine

## 2023-03-18 NOTE — Progress Notes (Addendum)
Cardiology Clinic Note   Date: 03/23/2023 ID: Misty Adams, DOB 1956-04-04, MRN [id number]  Primary Cardiologist:  Lorine Bears, MD  Patient Profile    Misty Adams is a 67 y.o. female who presents to the clinic today for close follow up.     Past medical history significant for: Chronic HFpEF. Echo 03/09/2023: EF 60 to 65%.  No RWMA.  Grade II DD.  Normal RV size/function.  Mild LAE.  Mild mitral stenosis, mean gradient 6 mmHg.  Moderate MAC.  Mild AS, mean gradient 9.7 mmHg. Hypertension. Hyperlipidemia. Lipid panel 10/16/2022: LDL 97, HDL 58, TG 133, total 181.  Subclavian artery stenosis. Carotid duplex 07/15/2017: No evidence of stenosis left ICA.  Left subclavian artery was stenotic with disturbed flow.  Elevated velocities noted in the left proximal subclavian artery suggestive of > 50% stenosis. Aortic arch angiography 08/06/2017: Angioplasty and stenting left subclavian artery. Chronic venous insufficiency. COPD. Interstitial lung disease. GERD. Hypothyroidism. CKD stage IIIb. Tobacco abuse.  In summary, previously evaluated for chest pain in June 2011 with exercise Myoview which was without ischemia or infarct EF 79%.  In April 2019 she underwent stenting of left subclavian artery as above.  In March 2024 she was admitted to [facility] with E. coli UTI, severe sepsis, and AKI.  High-sensitivity troponin rose to 56.  She did not undergo cardiology evaluation.  In August 2024 CT chest performed secondary to weight loss showed aortic atherosclerosis, unchanged 4 mm right apical pulmonary nodule, enlarged pulmonary trunk consistent with PAH, left adrenal adenoma.  Patient presented to [facility] ED via EMS for shortness of breath on 03/08/2023 and was admitted.  She reported a history of CHF managed by PCP with hydrochlorothiazide and Lasix.  She has never seen cardiology.  She reported stable home weight.  Every 2 to 3 months patient describes episodic tightness in jaws, arms, chest  usually occurring at rest, lasting about 30 minutes, and resolving after aspirin.  She is very sedentary at home.  She continues to smoke 1 pack/day and has chronic DOE worsening over the past year.  Patient went on a [place] cruise the week prior to admission and reported feeling more dyspneic and fatigued while on vacation.  She returned home in the early a.m. on 11/17 and went to bed.  Later that morning after eating breakfast she became markedly dyspneic.  She contacted her son who is a paramedic and then EMS.  She was hypoxic in the field and started on BiPAP.  Upon arrival to ED she was afebrile and hypertensive at 175/68, 93% on BiPAP.  EKG showed sinus rhythm, 97 bpm with baseline artifact.  Initial labs: BUN 50, creatinine 2.01, hemoglobin 10.9, BNP 710.1, troponin 18.  Chest x-ray showed mild CHF.  She was treated with IV Lasix and SL NTG.  Patient was discharged on 03/10/2023.  Secondary to elevated BUN/creatinine, Lasix and potassium were held until follow-up.  Carvedilol was increased and amlodipine was added for hypertension.  Plan for outpatient ischemic evaluation.      History of Present Illness    Misty Adams is followed by Dr. Kirke Corin for the above outlined history.   Patient was last seen in the office by me on 03/16/2023 for hospital follow up.  She was volume up at that time.  PCP recently started her on torsemide.  Torsemide was increased x 3 days then resume torsemide 20 mg. She was instructed to return in a week for close follow-up.  Today, patient is  accompanied by her daughter. She reports her weight began to decrease and her symptoms improved for a couple of days and then she began feeling like she was holding onto fluid again with weight going back up. She woke early this morning with increased dyspnea and low SPO2. Her weight increased 2 lb overnight. Normal weight ~170 lb. She is 179 lb in the office today. Very slight exertion causes dyspnea with patient stating  "breathing makes me short of breath." She reports abdomen tighter than usual. No complaints of chest pain, tightness or pressure.     ROS: All other systems reviewed and are otherwise negative except as noted in History of Present Illness.  Studies Reviewed    EKG is not ordered today.    Physical Exam    VS:  BP 131/62 (BP Location: Left Arm, Patient Position: Sitting, Cuff Size: Normal)   Pulse 70   Ht 5\' 4"  (1.626 m)   Wt 179 lb 12.8 oz (81.6 kg)   SpO2 (!) 83%   BMI 30.86 kg/m  , BMI Body mass index is 30.86 kg/m.  GEN: Well nourished, well developed, in no acute distress. Neck: +JVD. No carotid bruits. Cardiac:  RRR. No murmurs. No rubs or gallops.   Respiratory:  Respirations regular. Somewhat labored with speech. Diminished bilaterally without rales, wheezing or rhonchi. GI: Soft, nontender, nondistended. Extremities: Radials/DP/PT 2+ and equal bilaterally. No clubbing or cyanosis. 3+ pitting edema bilateral lower extremities.   Skin: Warm and dry, no rash. Neuro: Strength intact.  Assessment & Plan   Chronic HFpEF/DOE/Lower extremity edema/Hypoxia Recent hospital admission.  Echo November 2024 showed EF 60 to 65%, Grade II DD, mild LAE, mild mitral stenosis/AS.  Patient reports slight improved with increased torsemide for a couple of days then progressive dyspnea and weight gain thereafter. Weight up an additional 2 lb at home this morning. She woke very early this morning with increased dyspnea and low SPO2. SPO2 on intake 71% on 2 L O2, increased to 83% on my exam. Decreased breath sounds bilaterally without wheezing, rales or rhonchi. 3+ pitting edema bilateral lower extremities.  -Patient will be sent to the ED for IV diuresis and possible admission.   Disposition: Patient sent to ED.           Signed, Etta Grandchild. Shresta Risden, DNP, NP-C

## 2023-03-23 ENCOUNTER — Inpatient Hospital Stay
Admission: EM | Admit: 2023-03-23 | Discharge: 2023-03-28 | DRG: 321 | Disposition: A | Discharge status: Home Health | Payer: Medicare HMO | Attending: Internal Medicine | Admitting: Internal Medicine

## 2023-03-23 ENCOUNTER — Emergency Department: Payer: Medicare HMO

## 2023-03-23 ENCOUNTER — Encounter: Payer: Self-pay | Admitting: Student

## 2023-03-23 ENCOUNTER — Ambulatory Visit: Payer: Medicare HMO | Attending: Student | Admitting: Student

## 2023-03-23 ENCOUNTER — Other Ambulatory Visit: Payer: Self-pay

## 2023-03-23 VITALS — BP 131/62 | HR 70 | Ht 64.0 in | Wt 179.8 lb

## 2023-03-23 DIAGNOSIS — I700 Atherosclerosis of aorta: Secondary | ICD-10-CM | POA: Diagnosis present

## 2023-03-23 DIAGNOSIS — I509 Heart failure, unspecified: Secondary | ICD-10-CM | POA: Diagnosis not present

## 2023-03-23 DIAGNOSIS — I130 Hypertensive heart and chronic kidney disease with heart failure and stage 1 through stage 4 chronic kidney disease, or unspecified chronic kidney disease: Principal | ICD-10-CM | POA: Diagnosis present

## 2023-03-23 DIAGNOSIS — F419 Anxiety disorder, unspecified: Secondary | ICD-10-CM | POA: Diagnosis present

## 2023-03-23 DIAGNOSIS — K219 Gastro-esophageal reflux disease without esophagitis: Secondary | ICD-10-CM | POA: Diagnosis present

## 2023-03-23 DIAGNOSIS — I2510 Atherosclerotic heart disease of native coronary artery without angina pectoris: Secondary | ICD-10-CM | POA: Diagnosis not present

## 2023-03-23 DIAGNOSIS — N1832 Chronic kidney disease, stage 3b: Secondary | ICD-10-CM | POA: Diagnosis not present

## 2023-03-23 DIAGNOSIS — N179 Acute kidney failure, unspecified: Secondary | ICD-10-CM | POA: Diagnosis present

## 2023-03-23 DIAGNOSIS — E1122 Type 2 diabetes mellitus with diabetic chronic kidney disease: Secondary | ICD-10-CM | POA: Diagnosis not present

## 2023-03-23 DIAGNOSIS — F32A Depression, unspecified: Secondary | ICD-10-CM | POA: Diagnosis present

## 2023-03-23 DIAGNOSIS — E785 Hyperlipidemia, unspecified: Secondary | ICD-10-CM | POA: Diagnosis present

## 2023-03-23 DIAGNOSIS — I5020 Unspecified systolic (congestive) heart failure: Secondary | ICD-10-CM | POA: Diagnosis not present

## 2023-03-23 DIAGNOSIS — Z7982 Long term (current) use of aspirin: Secondary | ICD-10-CM

## 2023-03-23 DIAGNOSIS — R918 Other nonspecific abnormal finding of lung field: Secondary | ICD-10-CM | POA: Diagnosis not present

## 2023-03-23 DIAGNOSIS — D3502 Benign neoplasm of left adrenal gland: Secondary | ICD-10-CM | POA: Diagnosis present

## 2023-03-23 DIAGNOSIS — J9601 Acute respiratory failure with hypoxia: Secondary | ICD-10-CM | POA: Diagnosis present

## 2023-03-23 DIAGNOSIS — R0602 Shortness of breath: Secondary | ICD-10-CM | POA: Diagnosis not present

## 2023-03-23 DIAGNOSIS — I10 Essential (primary) hypertension: Secondary | ICD-10-CM | POA: Diagnosis present

## 2023-03-23 DIAGNOSIS — I272 Pulmonary hypertension, unspecified: Secondary | ICD-10-CM | POA: Diagnosis not present

## 2023-03-23 DIAGNOSIS — J432 Centrilobular emphysema: Secondary | ICD-10-CM | POA: Diagnosis not present

## 2023-03-23 DIAGNOSIS — D509 Iron deficiency anemia, unspecified: Secondary | ICD-10-CM | POA: Diagnosis present

## 2023-03-23 DIAGNOSIS — J9621 Acute and chronic respiratory failure with hypoxia: Secondary | ICD-10-CM | POA: Diagnosis present

## 2023-03-23 DIAGNOSIS — Z823 Family history of stroke: Secondary | ICD-10-CM

## 2023-03-23 DIAGNOSIS — R600 Localized edema: Secondary | ICD-10-CM | POA: Diagnosis not present

## 2023-03-23 DIAGNOSIS — Z8349 Family history of other endocrine, nutritional and metabolic diseases: Secondary | ICD-10-CM

## 2023-03-23 DIAGNOSIS — Z8049 Family history of malignant neoplasm of other genital organs: Secondary | ICD-10-CM

## 2023-03-23 DIAGNOSIS — E1151 Type 2 diabetes mellitus with diabetic peripheral angiopathy without gangrene: Secondary | ICD-10-CM | POA: Diagnosis present

## 2023-03-23 DIAGNOSIS — I2489 Other forms of acute ischemic heart disease: Secondary | ICD-10-CM | POA: Diagnosis present

## 2023-03-23 DIAGNOSIS — J439 Emphysema, unspecified: Secondary | ICD-10-CM | POA: Diagnosis not present

## 2023-03-23 DIAGNOSIS — Z79890 Hormone replacement therapy (postmenopausal): Secondary | ICD-10-CM

## 2023-03-23 DIAGNOSIS — Z885 Allergy status to narcotic agent status: Secondary | ICD-10-CM

## 2023-03-23 DIAGNOSIS — J449 Chronic obstructive pulmonary disease, unspecified: Secondary | ICD-10-CM | POA: Diagnosis present

## 2023-03-23 DIAGNOSIS — E669 Obesity, unspecified: Secondary | ICD-10-CM | POA: Diagnosis present

## 2023-03-23 DIAGNOSIS — Z833 Family history of diabetes mellitus: Secondary | ICD-10-CM

## 2023-03-23 DIAGNOSIS — F17210 Nicotine dependence, cigarettes, uncomplicated: Secondary | ICD-10-CM | POA: Diagnosis present

## 2023-03-23 DIAGNOSIS — Z806 Family history of leukemia: Secondary | ICD-10-CM

## 2023-03-23 DIAGNOSIS — Z8249 Family history of ischemic heart disease and other diseases of the circulatory system: Secondary | ICD-10-CM

## 2023-03-23 DIAGNOSIS — N184 Chronic kidney disease, stage 4 (severe): Secondary | ICD-10-CM | POA: Diagnosis not present

## 2023-03-23 DIAGNOSIS — Z83719 Family history of colon polyps, unspecified: Secondary | ICD-10-CM

## 2023-03-23 DIAGNOSIS — E782 Mixed hyperlipidemia: Secondary | ICD-10-CM | POA: Diagnosis not present

## 2023-03-23 DIAGNOSIS — E039 Hypothyroidism, unspecified: Secondary | ICD-10-CM | POA: Diagnosis present

## 2023-03-23 DIAGNOSIS — J90 Pleural effusion, not elsewhere classified: Secondary | ICD-10-CM | POA: Diagnosis not present

## 2023-03-23 DIAGNOSIS — G4700 Insomnia, unspecified: Secondary | ICD-10-CM | POA: Diagnosis present

## 2023-03-23 DIAGNOSIS — Z716 Tobacco abuse counseling: Secondary | ICD-10-CM

## 2023-03-23 DIAGNOSIS — I708 Atherosclerosis of other arteries: Secondary | ICD-10-CM | POA: Diagnosis present

## 2023-03-23 DIAGNOSIS — R0609 Other forms of dyspnea: Secondary | ICD-10-CM

## 2023-03-23 DIAGNOSIS — J849 Interstitial pulmonary disease, unspecified: Secondary | ICD-10-CM | POA: Diagnosis present

## 2023-03-23 DIAGNOSIS — Z79899 Other long term (current) drug therapy: Secondary | ICD-10-CM

## 2023-03-23 DIAGNOSIS — I5033 Acute on chronic diastolic (congestive) heart failure: Secondary | ICD-10-CM | POA: Diagnosis not present

## 2023-03-23 DIAGNOSIS — Z6829 Body mass index (BMI) 29.0-29.9, adult: Secondary | ICD-10-CM

## 2023-03-23 DIAGNOSIS — I517 Cardiomegaly: Secondary | ICD-10-CM | POA: Diagnosis not present

## 2023-03-23 DIAGNOSIS — J811 Chronic pulmonary edema: Secondary | ICD-10-CM | POA: Diagnosis not present

## 2023-03-23 DIAGNOSIS — E876 Hypokalemia: Secondary | ICD-10-CM | POA: Diagnosis present

## 2023-03-23 DIAGNOSIS — Z803 Family history of malignant neoplasm of breast: Secondary | ICD-10-CM

## 2023-03-23 DIAGNOSIS — I5032 Chronic diastolic (congestive) heart failure: Secondary | ICD-10-CM | POA: Diagnosis present

## 2023-03-23 DIAGNOSIS — R531 Weakness: Secondary | ICD-10-CM | POA: Diagnosis not present

## 2023-03-23 DIAGNOSIS — Z90710 Acquired absence of both cervix and uterus: Secondary | ICD-10-CM

## 2023-03-23 DIAGNOSIS — R0902 Hypoxemia: Secondary | ICD-10-CM

## 2023-03-23 DIAGNOSIS — Z955 Presence of coronary angioplasty implant and graft: Secondary | ICD-10-CM | POA: Diagnosis not present

## 2023-03-23 DIAGNOSIS — Z825 Family history of asthma and other chronic lower respiratory diseases: Secondary | ICD-10-CM

## 2023-03-23 DIAGNOSIS — Z83438 Family history of other disorder of lipoprotein metabolism and other lipidemia: Secondary | ICD-10-CM

## 2023-03-23 DIAGNOSIS — I110 Hypertensive heart disease with heart failure: Secondary | ICD-10-CM | POA: Diagnosis not present

## 2023-03-23 DIAGNOSIS — Z818 Family history of other mental and behavioral disorders: Secondary | ICD-10-CM

## 2023-03-23 DIAGNOSIS — Z9981 Dependence on supplemental oxygen: Secondary | ICD-10-CM

## 2023-03-23 DIAGNOSIS — Z801 Family history of malignant neoplasm of trachea, bronchus and lung: Secondary | ICD-10-CM

## 2023-03-23 HISTORY — DX: Interstitial pulmonary disease, unspecified: J84.9

## 2023-03-23 HISTORY — DX: Chronic diastolic (congestive) heart failure: I50.32

## 2023-03-23 HISTORY — DX: Chronic kidney disease, stage 3 unspecified: N18.30

## 2023-03-23 LAB — CBC
HCT: 32.8 % — ABNORMAL LOW (ref 36.0–46.0)
Hemoglobin: 9.2 g/dL — ABNORMAL LOW (ref 12.0–15.0)
MCH: 24.5 pg — ABNORMAL LOW (ref 26.0–34.0)
MCHC: 28 g/dL — ABNORMAL LOW (ref 30.0–36.0)
MCV: 87.2 fL (ref 80.0–100.0)
Platelets: 254 10*3/uL (ref 150–400)
RBC: 3.76 MIL/uL — ABNORMAL LOW (ref 3.87–5.11)
RDW: 21.2 % — ABNORMAL HIGH (ref 11.5–15.5)
WBC: 6.6 10*3/uL (ref 4.0–10.5)
nRBC: 0 % (ref 0.0–0.2)

## 2023-03-23 LAB — BASIC METABOLIC PANEL
Anion gap: 7 (ref 5–15)
BUN: 26 mg/dL — ABNORMAL HIGH (ref 8–23)
CO2: 30 mmol/L (ref 22–32)
Calcium: 9.3 mg/dL (ref 8.9–10.3)
Chloride: 104 mmol/L (ref 98–111)
Creatinine, Ser: 1.51 mg/dL — ABNORMAL HIGH (ref 0.44–1.00)
GFR, Estimated: 37 mL/min — ABNORMAL LOW (ref >60)
Glucose, Bld: 110 mg/dL — ABNORMAL HIGH (ref 70–99)
Potassium: 4.7 mmol/L (ref 3.5–5.1)
Sodium: 141 mmol/L (ref 135–145)

## 2023-03-23 LAB — TROPONIN I (HIGH SENSITIVITY): Troponin I (High Sensitivity): 8 ng/L (ref <18)

## 2023-03-23 LAB — BRAIN NATRIURETIC PEPTIDE: B Natriuretic Peptide: 554 pg/mL — ABNORMAL HIGH (ref 0.0–100.0)

## 2023-03-23 MED ORDER — BUDESON-GLYCOPYRROL-FORMOTEROL 160-9-4.8 MCG/ACT IN AERO
2.0000 | INHALATION_SPRAY | Freq: Two times a day (BID) | RESPIRATORY_TRACT | Status: DC
Start: 2023-03-23 — End: 2023-03-24

## 2023-03-23 MED ORDER — PANTOPRAZOLE SODIUM 40 MG PO TBEC
40.0000 mg | DELAYED_RELEASE_TABLET | Freq: Two times a day (BID) | ORAL | Status: DC
  Administered 2023-03-23 – 2023-03-28 (×9): 40 mg via ORAL
  Filled 2023-03-23 (×9): qty 1

## 2023-03-23 MED ORDER — CARVEDILOL 12.5 MG PO TABS
12.5000 mg | ORAL_TABLET | Freq: Two times a day (BID) | ORAL | Status: DC
  Administered 2023-03-23 – 2023-03-24 (×3): 12.5 mg via ORAL
  Filled 2023-03-23: qty 1
  Filled 2023-03-23: qty 2
  Filled 2023-03-23 (×2): qty 1

## 2023-03-23 MED ORDER — FERROUS SULFATE 325 (65 FE) MG PO TABS
325.0000 mg | ORAL_TABLET | Freq: Every day | ORAL | Status: DC
  Administered 2023-03-24 – 2023-03-28 (×4): 325 mg via ORAL
  Filled 2023-03-23 (×4): qty 1

## 2023-03-23 MED ORDER — ALPRAZOLAM 0.5 MG PO TABS
0.5000 mg | ORAL_TABLET | Freq: Two times a day (BID) | ORAL | Status: DC | PRN
  Administered 2023-03-23 – 2023-03-27 (×4): 0.5 mg via ORAL
  Filled 2023-03-23 (×4): qty 1

## 2023-03-23 MED ORDER — FUROSEMIDE 10 MG/ML IJ SOLN
40.0000 mg | Freq: Two times a day (BID) | INTRAMUSCULAR | Status: DC
  Administered 2023-03-23 – 2023-03-24 (×3): 40 mg via INTRAVENOUS
  Filled 2023-03-23 (×4): qty 4

## 2023-03-23 MED ORDER — SODIUM CHLORIDE 0.9% FLUSH: 3.0000 mL | INTRAVENOUS | Status: DC | PRN

## 2023-03-23 MED ORDER — GUAIFENESIN-DM 100-10 MG/5ML PO SYRP: 10.0000 mL | ORAL_SOLUTION | ORAL | Status: DC | PRN

## 2023-03-23 MED ORDER — ASPIRIN 81 MG PO TBEC
81.0000 mg | DELAYED_RELEASE_TABLET | Freq: Every day | ORAL | Status: DC
  Administered 2023-03-23 – 2023-03-27 (×5): 81 mg via ORAL
  Filled 2023-03-23 (×5): qty 1

## 2023-03-23 MED ORDER — FAMOTIDINE 20 MG PO TABS
40.0000 mg | ORAL_TABLET | Freq: Every day | ORAL | Status: DC
  Administered 2023-03-23 – 2023-03-27 (×5): 40 mg via ORAL
  Filled 2023-03-23 (×5): qty 2

## 2023-03-23 MED ORDER — ATORVASTATIN CALCIUM 20 MG PO TABS
40.0000 mg | ORAL_TABLET | Freq: Every day | ORAL | Status: DC
  Administered 2023-03-23 – 2023-03-27 (×5): 40 mg via ORAL
  Filled 2023-03-23 (×5): qty 2

## 2023-03-23 MED ORDER — FUROSEMIDE 10 MG/ML IJ SOLN
80.0000 mg | Freq: Once | INTRAMUSCULAR | Status: AC
  Administered 2023-03-23: 80 mg via INTRAVENOUS
  Filled 2023-03-23: qty 8

## 2023-03-23 MED ORDER — ACETAMINOPHEN 500 MG PO TABS
1000.0000 mg | ORAL_TABLET | Freq: Four times a day (QID) | ORAL | Status: DC | PRN
  Administered 2023-03-25 – 2023-03-28 (×3): 1000 mg via ORAL
  Filled 2023-03-23 (×3): qty 2

## 2023-03-23 MED ORDER — GABAPENTIN 100 MG PO CAPS
200.0000 mg | ORAL_CAPSULE | Freq: Every day | ORAL | Status: DC
  Administered 2023-03-23 – 2023-03-27 (×5): 200 mg via ORAL
  Filled 2023-03-23 (×5): qty 2

## 2023-03-23 MED ORDER — ALBUTEROL SULFATE (2.5 MG/3ML) 0.083% IN NEBU
2.5000 mg | INHALATION_SOLUTION | Freq: Four times a day (QID) | RESPIRATORY_TRACT | Status: DC | PRN
  Administered 2023-03-24 – 2023-03-25 (×2): 2.5 mg via RESPIRATORY_TRACT
  Filled 2023-03-23 (×2): qty 3

## 2023-03-23 MED ORDER — VENLAFAXINE HCL ER 75 MG PO CP24
150.0000 mg | ORAL_CAPSULE | Freq: Every day | ORAL | Status: DC
  Administered 2023-03-23 – 2023-03-28 (×5): 150 mg via ORAL
  Filled 2023-03-23 (×3): qty 2
  Filled 2023-03-23: qty 1
  Filled 2023-03-23: qty 2

## 2023-03-23 MED ORDER — TRAZODONE HCL 100 MG PO TABS
100.0000 mg | ORAL_TABLET | Freq: Every day | ORAL | Status: DC | PRN
  Administered 2023-03-23 – 2023-03-27 (×5): 100 mg via ORAL
  Filled 2023-03-23 (×5): qty 1

## 2023-03-23 MED ORDER — HYDRALAZINE HCL 20 MG/ML IJ SOLN
5.0000 mg | Freq: Four times a day (QID) | INTRAMUSCULAR | Status: DC | PRN
Start: 2023-03-23 — End: 2023-03-25

## 2023-03-23 MED ORDER — LEVOTHYROXINE SODIUM 25 MCG PO TABS
125.0000 ug | ORAL_TABLET | Freq: Every day | ORAL | Status: DC
  Administered 2023-03-24 – 2023-03-28 (×5): 125 ug via ORAL
  Filled 2023-03-23 (×5): qty 1

## 2023-03-23 MED ORDER — IRON SUCROSE 500 MG IVPB - SIMPLE MED
500.0000 mg | Freq: Once | INTRAVENOUS | Status: AC
Start: 2023-03-23 — End: 2023-03-23
  Administered 2023-03-23: 500 mg via INTRAVENOUS
  Filled 2023-03-23: qty 500

## 2023-03-23 MED ORDER — SODIUM CHLORIDE 0.9 % IV SOLN: 250.0000 mL | INTRAVENOUS | Status: AC | PRN

## 2023-03-23 MED ORDER — ONDANSETRON HCL 4 MG PO TABS: 4.0000 mg | ORAL_TABLET | ORAL | Status: DC | PRN

## 2023-03-23 MED ORDER — HEPARIN SODIUM (PORCINE) 5000 UNIT/ML IJ SOLN
5000.0000 [IU] | Freq: Two times a day (BID) | INTRAMUSCULAR | Status: DC
  Administered 2023-03-23 – 2023-03-27 (×7): 5000 [IU] via SUBCUTANEOUS
  Filled 2023-03-23 (×7): qty 1

## 2023-03-23 MED ORDER — SODIUM CHLORIDE 0.9% FLUSH
3.0000 mL | Freq: Two times a day (BID) | INTRAVENOUS | Status: DC
  Administered 2023-03-23 – 2023-03-28 (×8): 3 mL via INTRAVENOUS

## 2023-03-23 NOTE — ED Provider Notes (Signed)
[facility] Provider Note    None    (approximate)   History   Shortness of Breath   HPI  Misty Adams is a 67 y.o. female.  With CHF who comes in with shortness of breath.  I reviewed the note from today cardiology.  Patient was noted to be hypoxic to 83% for patient sent for IV diuresis and admission.  Patient reports that she was wearing oxygen this is on her normal baseline 2 L that she was still hypoxic.  She denies any falls and hitting her head denies any chest pain, abdominal pain.  She reports being compliant with her torsemide but still not getting better     Physical Exam   Triage Vital Signs: ED Triage Vitals  Encounter Vitals Group     BP 03/23/23 0859 (!) 144/52     Systolic BP Percentile --      Diastolic BP Percentile --      Pulse Rate 03/23/23 0859 65     Resp 03/23/23 0859 20     Temp 03/23/23 0859 (!) 97.3 F (36.3 C)     Temp Source 03/23/23 0859 Oral     SpO2 03/23/23 0859 (!) 89 %     Weight --      Height --      Head Circumference --      Peak Flow --      Pain Score 03/23/23 0903 0     Pain Loc --      Pain Education --      Exclude from Growth Chart --     Most recent vital signs: Vitals:   03/23/23 0859  BP: (!) 144/52  Pulse: 65  Resp: 20  Temp: (!) 97.3 F (36.3 C)  SpO2: (!) 89%     General: Awake, no distress.  CV:  Good peripheral perfusion.  Resp:  Normal effort.  Abd:  No distention.  Other:  Swelling of the legs, 3+ edema   ED Results / Procedures / Treatments   Labs (all labs ordered are listed, but only abnormal results are displayed) Labs Reviewed  CBC - Abnormal; Notable for the following components:      Result Value   RBC 3.76 (*)    Hemoglobin 9.2 (*)    HCT 32.8 (*)    MCH 24.5 (*)    MCHC 28.0 (*)    RDW 21.2 (*)    All other components within normal limits  BASIC METABOLIC PANEL  BRAIN NATRIURETIC PEPTIDE  TROPONIN I (HIGH SENSITIVITY)     EKG  My  interpretation of EKG:  Normal sinus rate of 63 without any ST elevation or T wave inversions, normal intervals  RADIOLOGY I have reviewed the xray personally and interpreted and patient has evidence of edema  PROCEDURES:  Critical Care performed: Yes, see critical care procedure note(s)  .Critical Care  Performed by: Concha Se, MD Authorized by: Concha Se, MD   Critical care provider statement:    Critical care time (minutes):  30   Critical care was necessary to treat or prevent imminent or life-threatening deterioration of the following conditions:  Respiratory failure   Critical care was time spent personally by me on the following activities:  Development of treatment plan with patient or surrogate, discussions with consultants, evaluation of patient's response to treatment, examination of patient, ordering and review of laboratory studies, ordering and review of radiographic studies, ordering and performing treatments and interventions,  pulse oximetry, re-evaluation of patient's condition and review of old charts    MEDICATIONS ORDERED IN ED: Medications  furosemide (LASIX) injection 80 mg (has no administration in time range)     IMPRESSION / MDM / ASSESSMENT AND PLAN / ED COURSE  I reviewed the triage vital signs and the nursing notes.   Patient's presentation is most consistent with severe exacerbation of chronic illness.   Patient comes in with concerns for hypoxia fluid overload.  Swelling is symmetric doubt DVT.  Will get labs to evaluate for ACS.  Patient was hypoxic on her 2 L bumped at 3 L.  She has got significant swelling on examination.  Her CBC shows slightly low hemoglobin but suspect this is dilutional.  Her creatinine is around baseline at 1.5.  Patient be given 80 of Lasix and I will discuss with the hospital team for admission    FINAL CLINICAL IMPRESSION(S) / ED DIAGNOSES   Final diagnoses:  Acute on chronic congestive heart failure,  unspecified heart failure type (HCC)  Acute respiratory failure with hypoxia (HCC)     Rx / DC Orders   ED Discharge Orders     None        Note:  This document was prepared using Dragon voice recognition software and may include unintentional dictation errors.   Concha Se, MD 03/23/23 [phone]

## 2023-03-23 NOTE — ED Notes (Signed)
Messaged pharmacy regarding Iron infusion

## 2023-03-23 NOTE — ED Notes (Signed)
Messaged MD regarding patient's O2 saturation and increase in O2 requirements.

## 2023-03-23 NOTE — Patient Instructions (Signed)
Medication Instructions:  No changes *If you need a refill on your cardiac medications before your next appointment, please call your pharmacy*   Lab Work: None ordered If you have labs (blood work) drawn today and your tests are completely normal, you will receive your results only by: MyChart Message (if you have MyChart) OR A paper copy in the mail If you have any lab test that is abnormal or we need to change your treatment, we will call you to review the results.   Testing/Procedures: None ordered   Follow-Up: At [facility], you and your health needs are our priority.  As part of our continuing mission to provide you with exceptional heart care, we have created designated Provider Care Teams.  These Care Teams include your primary Cardiologist (physician) and Advanced Practice Providers (APPs -  Physician Assistants and Nurse Practitioners) who all work together to provide you with the care you need, when you need it.  We recommend signing up for the patient portal called "MyChart".  Sign up information is provided on this After Visit Summary.  MyChart is used to connect with patients for Virtual Visits (Telemedicine).  Patients are able to view lab/test results, encounter notes, upcoming appointments, etc.  Non-urgent messages can be sent to your provider as well.   To learn more about what you can do with MyChart, go to [email or web address].    Your next appointment:   1 month(s)  Provider:   You may see Lorine Bears, MD or one of the following Advanced Practice Providers on your designated Care Team:   Carlos Levering, NP

## 2023-03-23 NOTE — H&P (Addendum)
History and Physical    VESA ALOE JWJ:[id number] DOB: [birth year] DOA: 03/23/2023  PCP: Danella Penton, MD (Confirm with patient/family/NH records and if not entered, this has to be entered at [facility] point of entry) Patient coming from: Home  I have personally briefly reviewed patient's old medical records in [facility]  Chief Complaint: Leg swelling, SOB  HPI: Misty Adams is a 67 y.o. female with medical history significant of chronic HFpEF, COPD, chronic hypoxic respiratory failure on nocturnal O2 2 L, HTN, CKD stage IIIb, chronic iron deficiency anemia, HLD, hypothyroidism, PVD, presented with worsening of shortness of breath, leg swelling.  Patient started noticed weight gaining and leg swelling about 2 weeks ago and she gained 9 pounds in 2 weeks.  Went to see her cardiology 7 days ago, who increased her torsemide from 20 mg to 40 mg daily x 3 days she tried.  However she only saw modest increase of urine output and she continued to gain weight and started to have exertional dyspnea.  Denied any cough no chest pains no fever or chills.  Today she went to see cardiology who sent her to ED.  ED Course: Afebrile, no tachycardia nonhypotensive O2 saturation 89% on 2 L.  Chest x-ray showed cardiomegaly and pulmonary congestion.  Blood work showed creatinine 1.5 compared to her baseline 1.3-2.0, K4.7, hemoglobin 9.2.  Review of Systems: As per HPI otherwise 14 point review of systems negative.    Past Medical History:  Diagnosis Date   Allergy    Anxiety    CHF (congestive heart failure) (HCC)    COPD (chronic obstructive pulmonary disease) (HCC)    Depression    Diabetes mellitus without complication (HCC)    Hyperlipidemia    Hypertension    Hypothyroidism    PAD (peripheral artery disease) (HCC)    Psoriasis    Substance abuse (HCC)    Tobacco abuse     Past Surgical History:  Procedure Laterality Date   ABDOMINAL HYSTERECTOMY     AORTIC ARCH ANGIOGRAPHY N/A  08/06/2017   Procedure: AORTIC ARCH ANGIOGRAPHY;  Surgeon: Fransisco Hertz, MD;  Location: [facility] INVASIVE CV LAB;  Service: Cardiovascular;  Laterality: N/A;   APPENDECTOMY     BIOPSY  12/19/2022   Procedure: BIOPSY;  Surgeon: Jaynie Collins, DO;  Location: [facility] ENDOSCOPY;  Service: Gastroenterology;;   CESAREAN SECTION     COLONOSCOPY     COLONOSCOPY WITH PROPOFOL N/A 12/07/2020   Procedure: COLONOSCOPY WITH PROPOFOL;  Surgeon: Jaynie Collins, DO;  Location: [facility] ENDOSCOPY;  Service: Gastroenterology;  Laterality: N/A;   COLONOSCOPY WITH PROPOFOL N/A 12/19/2022   Procedure: COLONOSCOPY WITH PROPOFOL;  Surgeon: Jaynie Collins, DO;  Location: [facility] ENDOSCOPY;  Service: Gastroenterology;  Laterality: N/A;   dilatation and curettage     DILATION AND CURETTAGE OF UTERUS     ESOPHAGOGASTRODUODENOSCOPY N/A 12/07/2020   Procedure: ESOPHAGOGASTRODUODENOSCOPY (EGD);  Surgeon: Jaynie Collins, DO;  Location: [facility] ENDOSCOPY;  Service: Gastroenterology;  Laterality: N/A;   ESOPHAGOGASTRODUODENOSCOPY (EGD) WITH PROPOFOL N/A 12/19/2022   Procedure: ESOPHAGOGASTRODUODENOSCOPY (EGD) WITH PROPOFOL;  Surgeon: Jaynie Collins, DO;  Location: [facility] ENDOSCOPY;  Service: Gastroenterology;  Laterality: N/A;   KNEE SURGERY     left wrist ganglion cyst removal     OOPHORECTOMY  1994   PERIPHERAL VASCULAR INTERVENTION Left 08/06/2017   Procedure: PERIPHERAL VASCULAR INTERVENTION;  Surgeon: Fransisco Hertz, MD;  Location: [facility] INVASIVE CV LAB;  Service: Cardiovascular;  Laterality: Left;  subclavian  POLYPECTOMY  12/19/2022   Procedure: POLYPECTOMY;  Surgeon: Jaynie Collins, DO;  Location: [facility] ENDOSCOPY;  Service: Gastroenterology;;   TUBAL LIGATION       reports that she has been smoking cigarettes. She has a 100 pack-year smoking history. She has never used smokeless tobacco. She reports current alcohol use. She reports that she does not currently use drugs.  Allergies  Allergen  Reactions   Hydrocodone-Acetaminophen Hives   Vicodin [Hydrocodone-Acetaminophen] Hives   Trelegy Ellipta [Fluticasone-Umeclidin-Vilant] Rash    Changed to Budeson-Glycopyrrol-Formoterol by provider    Family History  Problem Relation Age of Onset   Uterine cancer Mother    Stroke Mother    Aneurysm Mother    Heart attack Father    Coronary artery disease Father    Leukemia Father    Depression Father    Diabetes Father    Hyperlipidemia Father    Hypertension Father    Colon polyps Sister    Obesity Sister    Thyroid disease Sister    COPD Sister    Lung cancer Sister    Breast cancer Maternal Aunt    Breast cancer Maternal Aunt      Prior to Admission medications   Medication Sig Start Date End Date Taking? Authorizing Provider  albuterol (PROVENTIL HFA;VENTOLIN HFA) 108 (90 Base) MCG/ACT inhaler Inhale 2 puffs into the lungs every 6 (six) hours as needed for wheezing or shortness of breath. 07/14/17  Yes Rifenbark, Lloyd Huger, MD  ALPRAZolam Prudy Feeler) 0.5 MG tablet Take 0.5 mg by mouth 2 (two) times daily as needed for anxiety.  01/16/17  Yes [provider]  amLODipine (NORVASC) 5 MG tablet Take 1 tablet (5 mg total) by mouth daily. 03/11/23  Yes Enedina Finner, MD  aspirin 81 MG tablet Take 81 mg by mouth at bedtime.    Yes [provider]  atorvastatin (LIPITOR) 40 MG tablet Take 1 tablet (40 mg total) by mouth at bedtime. 07/14/17  Yes Merrily Brittle, MD  BREZTRI AEROSPHERE 160-9-4.8 MCG/ACT AERO Inhale 2 puffs into the lungs 2 (two) times daily. 06/23/22 06/23/23 Yes [provider]  carvedilol (COREG) 12.5 MG tablet Take 1 tablet (12.5 mg total) by mouth 2 (two) times daily with a meal. 03/10/23  Yes Enedina Finner, MD  gabapentin (NEURONTIN) 100 MG capsule Take 200 mg by mouth at bedtime.   Yes [provider]  levothyroxine (SYNTHROID, LEVOTHROID) 125 MCG tablet Take 125 mcg by mouth daily.    Yes [provider]  pantoprazole (PROTONIX) 40  MG tablet Take 40 mg by mouth 2 (two) times daily.   Yes [provider]  potassium chloride SA (KLOR-CON M) 20 MEQ tablet Take 2 tablets (40 mEq total) by mouth 2 (two) times daily for 3 days. 03/10/23 03/23/23 Yes Enedina Finner, MD  temazepam (RESTORIL) 15 MG capsule Take 15 mg by mouth at bedtime as needed for sleep. 08/20/20  Yes [provider]  torsemide (DEMADEX) 20 MG tablet Take 20 mg by mouth daily.   Yes [provider]  traZODone (DESYREL) 100 MG tablet Take 100 mg by mouth daily as needed for sleep. 08/31/20  Yes [provider]  venlafaxine XR (EFFEXOR-XR) 75 MG 24 hr capsule Take 150 mg by mouth daily. 04/24/22 04/24/23 Yes [provider]  acetaminophen (TYLENOL) 500 MG tablet Take 1,000 mg by mouth every 6 (six) hours as needed for moderate pain or headache.    [provider]  famotidine (PEPCID) 40 MG tablet Take  40 mg by mouth daily. Patient not taking: Reported on 03/23/2023 12/08/22   [provider]  guaiFENesin-dextromethorphan (ROBITUSSIN DM) 100-10 MG/5ML syrup Take 10 mLs by mouth every 4 (four) hours as needed for cough. 09/21/20   Regalado, Belkys A, MD  ondansetron (ZOFRAN) 4 MG tablet Take 4 mg by mouth every 4 (four) hours as needed for nausea or vomiting.    [provider]  OXYGEN Inhale 2 L/L into the lungs at bedtime.    [provider]  potassium chloride (KLOR-CON) 10 MEQ tablet Take 10 mEq by mouth daily. Patient not taking: Reported on 03/23/2023 02/15/23   [provider]    Physical Exam: Vitals:   03/23/23 0859 03/23/23 1018  BP: (!) 144/52 126/74  Pulse: 65 68  Resp: 20 20  Temp: (!) 97.3 F (36.3 C)   TempSrc: Oral   SpO2: (!) 89% 92%    Constitutional: NAD, calm, comfortable Vitals:   03/23/23 0859 03/23/23 1018  BP: (!) 144/52 126/74  Pulse: 65 68  Resp: 20 20  Temp: (!) 97.3 F (36.3 C)   TempSrc: Oral   SpO2: (!) 89% 92%   Eyes: PERRL, lids and  conjunctivae normal ENMT: Mucous membranes are moist. Posterior pharynx clear of any exudate or lesions.Normal dentition.  Neck: normal, supple, no masses, no thyromegaly Respiratory: clear to auscultation bilaterally, no wheezing, fine crackles on bilateral bases. Normal respiratory effort. No accessory muscle use.  Cardiovascular: Regular rate and rhythm, no murmurs / rubs / gallops. 2+ extremity edema. 2+ pedal pulses. No carotid bruits.  Abdomen: no tenderness, no masses palpated. No hepatosplenomegaly. Bowel sounds positive.  Musculoskeletal: no clubbing / cyanosis. No joint deformity upper and lower extremities. Good ROM, no contractures. Normal muscle tone.  Skin: no rashes, lesions, ulcers. No induration Neurologic: CN 2-12 grossly intact. Sensation intact, DTR normal. Strength 5/5 in all 4.  Psychiatric: Normal judgment and insight. Alert and oriented x 3. Normal mood.     Labs on Admission: I have personally reviewed following labs and imaging studies  CBC: Recent Labs  Lab 03/23/23 0905  WBC 6.6  HGB 9.2*  HCT 32.8*  MCV 87.2  PLT 254   Basic Metabolic Panel: Recent Labs  Lab 03/23/23 0905  NA 141  K 4.7  CL 104  CO2 30  GLUCOSE 110*  BUN 26*  CREATININE 1.51*  CALCIUM 9.3   GFR: Estimated Creatinine Clearance: 36.4 mL/min (A) (by C-G formula based on SCr of 1.51 mg/dL (H)). Liver Function Tests: No results for input(s): "AST", "ALT", "ALKPHOS", "BILITOT", "PROT", "ALBUMIN" in the last 168 hours. No results for input(s): "LIPASE", "AMYLASE" in the last 168 hours. No results for input(s): "AMMONIA" in the last 168 hours. Coagulation Profile: No results for input(s): "INR", "PROTIME" in the last 168 hours. Cardiac Enzymes: No results for input(s): "CKTOTAL", "CKMB", "CKMBINDEX", "TROPONINI" in the last 168 hours. BNP (last 3 results) No results for input(s): "PROBNP" in the last 8760 hours. HbA1C: No results for input(s): "HGBA1C" in the last 72  hours. CBG: No results for input(s): "GLUCAP" in the last 168 hours. Lipid Profile: No results for input(s): "CHOL", "HDL", "LDLCALC", "TRIG", "CHOLHDL", "LDLDIRECT" in the last 72 hours. Thyroid Function Tests: No results for input(s): "TSH", "T4TOTAL", "FREET4", "T3FREE", "THYROIDAB" in the last 72 hours. Anemia Panel: No results for input(s): "VITAMINB12", "FOLATE", "FERRITIN", "TIBC", "IRON", "RETICCTPCT" in the last 72 hours. Urine analysis:    Component Value Date/Time   COLORURINE AMBER (A) 07/14/2022 1524  APPEARANCEUR CLOUDY (A) 07/14/2022 1524   LABSPEC 1.013 07/14/2022 1524   PHURINE 5.0 07/14/2022 1524   GLUCOSEU NEGATIVE 07/14/2022 1524   HGBUR MODERATE (A) 07/14/2022 1524   BILIRUBINUR NEGATIVE 07/14/2022 1524   KETONESUR NEGATIVE 07/14/2022 1524   PROTEINUR >=300 (A) 07/14/2022 1524   NITRITE NEGATIVE 07/14/2022 1524   LEUKOCYTESUR LARGE (A) 07/14/2022 1524    Radiological Exams on Admission: DG Chest 2 View  Result Date: 03/23/2023 CLINICAL DATA:  Shortness of breath. EXAM: CHEST - 2 VIEW COMPARISON:  Chest radiograph dated March 08, 2023. FINDINGS: Stable cardiomegaly. The mediastinal contours are within normal limits. Aortic atherosclerosis. Vascular stent in place. Bilateral basilar predominant interstitial opacities. Small right pleural effusion with basilar atelectasis. No pneumothorax. No acute osseous abnormality. IMPRESSION: 1. Cardiomegaly with findings suggestive of interstitial pulmonary edema. 2. Small right pleural effusion. Electronically Signed   By: Hart Robinsons M.D.   On: 03/23/2023 09:29    EKG: Independently reviewed.  Sinus rhythm, no acute ST changes.  Assessment/Plan Principal Problem:   CHF (congestive heart failure) (HCC) Active Problems:   Acute on chronic heart failure with preserved ejection fraction (HFpEF) (HCC)  (please populate well all problems here in Problem List. (For example, if patient is on BP meds at home and you  resume or decide to hold them, it is a problem that needs to be her. Same for CAD, COPD, HLD and so on)  Acute on chronic HFpEF decompensation -Significant fluid overload and weight gaining for the last 2 to 3 weeks and failed outpatient management with increasing p.o. torsemide for last 3 days. -Continue IV Lasix 40 mg IV twice daily, monitor kidney function -Hold off amlodipine given there is a worsening of peripheral edema -Echo was done less than 1 month ago, will not repeat echo at this time. -Send a text to cardiology Dr. Kirke Corin  Acute on chronic hypoxic respiratory failure -Secondary to CHF decompensation, management as above  CKD stage IIIb -Volume overloaded secondary to CHF decompensation, creatinine level stable -On IV Lasix, monitor kidney function  Worsening of chronic iron deficiency anemia -According to most recent iron study in November, patient has significant low iron saturation -Given the patient has frequent CHF decompensation, will give 1 dose of IV iron and start patient on p.o. iron supplement. -Patient denied any blood tarry stool or blood in the stool.  Recommend she follow-up with GI for outpatient colonoscopy.  HTN -Continue Coreg -Hold off amlodipine due to CHF decompensation -Consider hydralazine and Imdur regimen once blood pressure stabilized  Hypothyroidism -Continue Synthroid  HLD -Continue statin  Tachycardia/depression -Continue Effexor, at bedtime trazodone  DVT prophylaxis: Heparin subcu Code Status: Full code Family Communication: Daughter at bedside Disposition Plan: Patient is sick with CHF decompensation on top of CKD requiring IV diuresis and closely monitoring kidney function, expect more than 2 midnight hospital stay. Consults called: Text message sent to cardiology Dr. Kirke Corin Admission status: Telemetry admission   Emeline General MD [facility] Pager [phone]  03/23/2023, 11:55 AM

## 2023-03-23 NOTE — ED Triage Notes (Signed)
Pt comes from [facility] with c/o increased fluid overload and sob. Pt wears 2L O2 at home. Pt currently 89% on 2L. Pt bumped up to 3L.  Pt states this all started last week. Pt was in hospital last Monday. Pt take torsemide.  Pt states about 2 lb weight gain over night.

## 2023-03-23 NOTE — Consult Note (Signed)
Cardiology Consult    Patient ID: KAYDA HARSH MRN: [id number], DOB/AGE: 1955/11/20   Admit date: 03/23/2023 Date of Consult: 03/23/2023  Primary Physician: Danella Penton, MD Primary Cardiologist: Lorine Bears, MD Requesting Provider: Irving Burton, MD  Patient Profile    Misty Adams is a 68 y.o. female with a history of HFpEF, HTN, HL, subclavian stenosis, chronic venous insuff, COPD, ILD, GERD, hypothyroidism, CKD IIIb, and tob abuse, who is being seen today for the evaluation of acute on chronic HFpEF at the request of Dr. Chipper Herb.  Past Medical History  Subjective  Past Medical History:  Diagnosis Date   Allergy    Anxiety    CHF (congestive heart failure) (HCC)    COPD (chronic obstructive pulmonary disease) (HCC)    Depression    Diabetes mellitus without complication (HCC)    Hyperlipidemia    Hypertension    Hypothyroidism    PAD (peripheral artery disease) (HCC)    Psoriasis    Substance abuse (HCC)    Tobacco abuse     Past Surgical History:  Procedure Laterality Date   ABDOMINAL HYSTERECTOMY     AORTIC ARCH ANGIOGRAPHY N/A 08/06/2017   Procedure: AORTIC ARCH ANGIOGRAPHY;  Surgeon: Fransisco Hertz, MD;  Location: [facility] INVASIVE CV LAB;  Service: Cardiovascular;  Laterality: N/A;   APPENDECTOMY     BIOPSY  12/19/2022   Procedure: BIOPSY;  Surgeon: Jaynie Collins, DO;  Location: [facility] ENDOSCOPY;  Service: Gastroenterology;;   CESAREAN SECTION     COLONOSCOPY     COLONOSCOPY WITH PROPOFOL N/A 12/07/2020   Procedure: COLONOSCOPY WITH PROPOFOL;  Surgeon: Jaynie Collins, DO;  Location: [facility] ENDOSCOPY;  Service: Gastroenterology;  Laterality: N/A;   COLONOSCOPY WITH PROPOFOL N/A 12/19/2022   Procedure: COLONOSCOPY WITH PROPOFOL;  Surgeon: Jaynie Collins, DO;  Location: [facility] ENDOSCOPY;  Service: Gastroenterology;  Laterality: N/A;   dilatation and curettage     DILATION AND CURETTAGE OF UTERUS     ESOPHAGOGASTRODUODENOSCOPY N/A 12/07/2020    Procedure: ESOPHAGOGASTRODUODENOSCOPY (EGD);  Surgeon: Jaynie Collins, DO;  Location: [facility] ENDOSCOPY;  Service: Gastroenterology;  Laterality: N/A;   ESOPHAGOGASTRODUODENOSCOPY (EGD) WITH PROPOFOL N/A 12/19/2022   Procedure: ESOPHAGOGASTRODUODENOSCOPY (EGD) WITH PROPOFOL;  Surgeon: Jaynie Collins, DO;  Location: [facility] ENDOSCOPY;  Service: Gastroenterology;  Laterality: N/A;   KNEE SURGERY     left wrist ganglion cyst removal     OOPHORECTOMY  1994   PERIPHERAL VASCULAR INTERVENTION Left 08/06/2017   Procedure: PERIPHERAL VASCULAR INTERVENTION;  Surgeon: Fransisco Hertz, MD;  Location: [facility] INVASIVE CV LAB;  Service: Cardiovascular;  Laterality: Left;  subclavian   POLYPECTOMY  12/19/2022   Procedure: POLYPECTOMY;  Surgeon: Jaynie Collins, DO;  Location: [facility] ENDOSCOPY;  Service: Gastroenterology;;   TUBAL LIGATION       Allergies  Allergies  Allergen Reactions   Hydrocodone-Acetaminophen Hives   Vicodin [Hydrocodone-Acetaminophen] Hives   Trelegy Ellipta [Fluticasone-Umeclidin-Vilant] Rash    Changed to Budeson-Glycopyrrol-Formoterol by provider      History of Present Illness     67 y/o ? w/ a h/o HTN, PAD s/p L subclavian stenting (07/2017), DM, HL, tob abuse, COPD, ILD, pulm nodules, Ao atherosclerosis, CKD III, anxiety, depression, anemia, and hypothyroidism. She was previously evaluated for chest pain in 09/2009, and underwent Ex myoview, which was w/o ischemia or infarct w/ EF of 79%. In 07/2017, she required stenting of the L subclavian artery due to severe stenosis. In 06/2022, she was admitted to [facility] w/ E  coli UTI, severe sepsis, and AKI. High sensitivity troponins rose to 56 during that admission. She did not undergo any cardiology evaluation. IN 11/2022 CT of the chest was performed due to wt loss and showed Ao atherosclerosis, unchanged 4mm R apical pulm nodule, enlarged pulm trunk - PAH, and L adrenal adenoma.    She was admitted in November 2024 in the setting of  progressive dyspnea, fatigue, and hypoxia requiring BiPAP.  She was hypertensive and volume overloaded on arrival.  She responded well to intravenous Lasix with improvement in breathing and bump in creatinine to 2.12.  She was discharged home November 19 off of Lasix with plan for early follow-up.  She was subsequently placed on torsemide by her primary care provider and was noted to be volume overloaded at cardiology visit November 25.  Torsemide was doubled for 3 days with some improvement of symptoms though at office visit today, she had regained weight of 279 pounds (dry weight of 170).  SpO2 was 71% on 2 L of oxygen.  And decision was made to send her to the ED for further evaluation.    Here, she is afebrile and mildly hypertensive.  Oxygenation improved into the low 90s on 5 L/min.  ECG shows sinus rhythm/sinus arrhythmia at 63 bpm without acute ST or T changes.  Chest x-ray shows cardiomegaly with interstitial edema and a small right pleural effusion.  Lab work notable for relatively stable creatinine at 1.51 with a BUN of 26, BNP of 554, troponin of 8, and stable, normocytic anemia at 9.2/32.8.  She has received Lasix 80 mg IV x 1 and she believes she is already put out about a liter.  Breathing has improved some since this morning.  Inpatient Medications  Subjective    aspirin EC  81 mg Oral QHS   atorvastatin  40 mg Oral QHS   Budeson-Glycopyrrol-Formoterol  2 puff Inhalation BID   carvedilol  12.5 mg Oral BID WC   famotidine  40 mg Oral QHS   [START ON 03/24/2023] ferrous sulfate  325 mg Oral Q breakfast   furosemide  40 mg Intravenous BID   gabapentin  200 mg Oral QHS   heparin  5,000 Units Subcutaneous Q12H   [START ON 03/24/2023] levothyroxine  125 mcg Oral Q0600   pantoprazole  40 mg Oral BID   sodium chloride flush  3 mL Intravenous Q12H   venlafaxine XR  150 mg Oral Daily    Family History    Family History  Problem Relation Age of Onset   Uterine cancer Mother    Stroke  Mother    Aneurysm Mother    Heart attack Father    Coronary artery disease Father    Leukemia Father    Depression Father    Diabetes Father    Hyperlipidemia Father    Hypertension Father    Colon polyps Sister    Obesity Sister    Thyroid disease Sister    COPD Sister    Lung cancer Sister    Breast cancer Maternal Aunt    Breast cancer Maternal Aunt    She indicated that her mother is deceased. She indicated that her father is deceased. She indicated that two of her three sisters are alive. She indicated that only one of her two maternal aunts is alive.   Social History    Social History   Socioeconomic History   Marital status: Divorced    Spouse name: Not on file   Number of children:  5   Years of education: Not on file   Highest education level: Associate degree: academic program  Occupational History   Occupation: Retired  Tobacco Use   Smoking status: Every Day    Current packs/day: 2.00    Average packs/day: 2.0 packs/day for 50.0 years (100.0 ttl pk-yrs)    Types: Cigarettes   Smokeless tobacco: Never  Vaping Use   Vaping status: Former   Substances: Nicotine  Substance and Sexual Activity   Alcohol use: Yes   Drug use: Not Currently   Sexual activity: Not Currently  Other Topics Concern   Not on file  Social History Narrative   Lives locally by herself.  Sedentary.   Social Determinants of Health   Financial Resource Strain: Low Risk  (03/10/2023)   Overall Financial Resource Strain (CARDIA)    Difficulty of Paying Living Expenses: Not hard at all  Food Insecurity: No Food Insecurity (03/09/2023)   Hunger Vital Sign    Worried About Running Out of Food in the Last Year: Never true    Ran Out of Food in the Last Year: Never true  Transportation Needs: No Transportation Needs (03/10/2023)   PRAPARE - Administrator, Civil Service (Medical): No    Lack of Transportation (Non-Medical): No  Physical Activity: Not on file  Stress: Not  on file  Social Connections: Not on file  Intimate Partner Violence: Not At Risk (03/09/2023)   Humiliation, Afraid, Rape, and Kick questionnaire    Fear of Current or Ex-Partner: No    Emotionally Abused: No    Physically Abused: No    Sexually Abused: No     Review of Systems    General:  No chills, fever, night sweats or weight changes.  Cardiovascular:  No chest pain, +++ dyspnea on exertion, +++ lower ext edema, +++ increased abdominal girth, +++ orthopnea, no palpitations, paroxysmal nocturnal dyspnea. Dermatological: No rash, lesions/masses Respiratory: No cough, +++ dyspnea Urologic: No hematuria, dysuria Abdominal:   No nausea, vomiting, diarrhea, bright red blood per rectum, melena, or hematemesis Neurologic:  No visual changes, wkns, changes in mental status. All other systems reviewed and are otherwise negative except as noted above.    Objective  Physical Exam    Blood pressure (!) 155/61, pulse 60, temperature (!) 97.3 F (36.3 C), temperature source Oral, resp. rate 20, SpO2 92%.  General: Pleasant, NAD Psych: Normal affect. Neuro: Alert and oriented X 3. Moves all extremities spontaneously. HEENT: Normal  Neck: Supple without bruits.  JVD to jaw. Lungs:  Resp regular and unlabored, crackles at left base. Heart: RRR no s3, s4.  2/6 systolic murmur at the left lower sternal border. Abdomen: Soft, non-tender, non-distended, BS + x 4.  Extremities: No clubbing, cyanosis or edema. DP/PT2+, Radials 2+ and equal bilaterally.  Labs    Cardiac Enzymes Recent Labs  Lab 03/08/23 2325 03/09/23 0648 03/23/23 0905  TROPONINIHS 18* 38* 8     BNP    Component Value Date/Time   BNP 554.0 (H) 03/23/2023 0905   Lab Results  Component Value Date   WBC 6.6 03/23/2023   HGB 9.2 (L) 03/23/2023   HCT 32.8 (L) 03/23/2023   MCV 87.2 03/23/2023   PLT 254 03/23/2023    Recent Labs  Lab 03/23/23 0905  NA 141  K 4.7  CL 104  CO2 30  BUN 26*  CREATININE 1.51*   CALCIUM 9.3  GLUCOSE 110*    Lab Results  Component Value Date  DDIMER 1.52 (H) 09/21/2020    Radiology Studies    DG Chest 2 View  Result Date: 03/23/2023 CLINICAL DATA:  Shortness of breath. EXAM: CHEST - 2 VIEW COMPARISON:  Chest radiograph dated March 08, 2023. FINDINGS: Stable cardiomegaly. The mediastinal contours are within normal limits. Aortic atherosclerosis. Vascular stent in place. Bilateral basilar predominant interstitial opacities. Small right pleural effusion with basilar atelectasis. No pneumothorax. No acute osseous abnormality. IMPRESSION: 1. Cardiomegaly with findings suggestive of interstitial pulmonary edema. 2. Small right pleural effusion. Electronically Signed   By: Hart Robinsons M.D.   On: 03/23/2023 09:29   DG Chest Port 1 View  Result Date: 03/08/2023 CLINICAL DATA:  Shortness of breath EXAM: PORTABLE CHEST 1 VIEW COMPARISON:  07/14/2022 FINDINGS: Cardiac shadow is prominent. Aortic calcifications are seen. Increasing vascular congestion is noted with mild interstitial edema. Basilar atelectatic changes are noted as well. No bony abnormality is seen. IMPRESSION: Changes of mild CHF. Electronically Signed   By: Alcide Clever M.D.   On: 03/08/2023 23:38      ECG & Cardiac Imaging    sinus rhythm/sinus arrhythmia at 63 bpm without acute ST or T changes - personally reviewed.  Assessment & Plan    1.  Acute on chronic heart failure with preserved ejection fraction: Patient admitted last month with volume overload and dyspnea.  Ef 60-65% on echo at that time.  She bumped creat after just 2 doses of IV lasix and was d/c'd home.  She had recurrent volume overload in the outpt setting necessitating titration of PO torsemide.  Unfortunately, she was seen back in clinic today w/ worsening edema, dyspnea, orthopnea, and hypoxia, prompting presentation to the ED.  Here, BNP 554 and CXR w/ edema.  She has responded well to lasix thus far, though remains volume  overloaded on exam.  Continue intravenous Lasix.  Blood pressure currently elevated.  Continue beta-blocker and follow with diuresis.  2.  Primary hypertension: Blood pressure currently elevated.  Follow with diuresis.  Continue beta-blocker.  Will need renal arterial duplex in outpatient setting.  3.  History of demand ischemia: Troponin up to 38 at last admission.  Currently normal.  Denies any prior history of chest pain.  Certainly high risk for coronary disease given past medical history and PAD.  Will need outpatient Lexiscan PET/CT.  4.  Hyperlipidemia: Continue statin.  5.  Tobacco abuse/COPD/interstitial lung disease: Still smoking 1 pack a day.  Cessation advised.  6.  Stage III chronic kidney disease: Follow with diuresis.  Appears to have a narrow euvolemic window.  Risk Assessment/Risk Scores:        New York Heart Association (NYHA) Functional Class NYHA Class III    Signed, Nicolasa Ducking, NP 03/23/2023, 1:29 PM  For questions or updates, please contact   Please consult [email or web address] for contact info under Cardiology/STEMI.

## 2023-03-23 NOTE — TOC Initial Note (Signed)
Transition of Care [facility]) - Initial/Assessment Note    Patient Details  Name: Misty Adams MRN: [id number] Date of Birth: 1955/10/31  [facility]) CM/SW Contact:    Marquita Palms, LCSW Phone Number: 03/23/2023, 4:08 PM  Clinical Narrative:                  CSW met with patient bedside. Patient reports she lives with her daughter and 2 sons. Patient not agreeable with TOC consult for SNF. Patient reports she is agreeable to possible HH if referred by MD. [facility]. Patient reports she "keeps" getting sick when she leaves the hospital.(weakness and problems breathing) Awaiting continued medical workup.  Expected Discharge Plan: Home/Self Care (CSW completed consult for SNF; patient refused) Barriers to Discharge: Continued Medical Work up   Patient Goals and CMS Choice Patient states their goals for this hospitalization and ongoing recovery are:: going home and feeling better          Expected Discharge Plan and Services In-house Referral: Clinical Social Work Discharge Planning Services:  (pending DC) Post Acute Care Choice: Home Health (possible home health patient refused SNF) Living arrangements for the past 2 months: Single Family Home                                      Prior Living Arrangements/Services Living arrangements for the past 2 months: Single Family Home Lives with:: Self Patient language and need for interpreter reviewed:: No Do you feel safe going back to the place where you live?: Yes      Need for Family Participation in Patient Care: No (Comment) Care giver support system in place?: Yes (comment) Current home services: Other (comment) Criminal Activity/Legal Involvement Pertinent to Current Situation/Hospitalization: No - Comment as needed  Activities of Daily Living      Permission Sought/Granted Permission sought to share information with : Case Manager Permission granted to share  information with : No              Emotional Assessment Appearance:: Disheveled Attitude/Demeanor/Rapport: Gracious Affect (typically observed): Accepting Orientation: : Oriented to Self, Oriented to Place, Oriented to  Time, Oriented to Situation   Psych Involvement: No (comment)  Admission diagnosis:  CHF (congestive heart failure) (HCC) [I50.9] Patient Active Problem List   Diagnosis Date Noted   CHF (congestive heart failure) (HCC) 03/23/2023   Hypokalemia 03/10/2023   Acute respiratory failure with hypoxia (HCC) 03/09/2023   Acute on chronic heart failure with preserved ejection fraction (HFpEF) (HCC) 03/09/2023   Dyslipidemia 03/09/2023   GERD without esophagitis 03/09/2023   Acute kidney injury superimposed on chronic kidney disease (HCC) 03/09/2023   Symptomatic anemia 12/08/2022   Severe sepsis (HCC) 07/16/2022   Acute pyelonephritis 07/16/2022   Lactic acidosis 07/16/2022   Thrombocytopenia (HCC) 07/16/2022   Essential hypertension 07/16/2022   Hyperlipidemia, unspecified 07/16/2022   Hypothyroidism 07/16/2022   Anxiety and depression 07/16/2022   Acute renal failure with acute tubular necrosis superimposed on stage 3b chronic kidney disease (HCC) 07/14/2022   Chronic obstructive pulmonary disease (HCC) 07/14/2022   Pneumonia 09/21/2020   Pneumonia due to COVID-19 virus 09/20/2020   Chronic venous insufficiency 09/16/2017   Subclavian artery stenosis, left (HCC) 07/15/2017   PCP:  Danella Penton, MD [facility] [phone] [place] Rough, [facility] - [address] [place] [address] Phone:  [phone] Fax: [phone]     Social Determinants of Health (SDOH) Social History: SDOH Screenings   Food Insecurity: No Food Insecurity (03/09/2023)  Housing: Low Risk  (03/10/2023)  Transportation Needs: No Transportation Needs (03/10/2023)  Utilities: Not At Risk (03/09/2023)  Alcohol Screen: Low Risk  (03/10/2023)  Depression  (PHQ2-9): Low Risk  (12/08/2022)  Financial Resource Strain: Low Risk  (03/10/2023)  Tobacco Use: High Risk (03/23/2023)   SDOH Interventions:     Readmission Risk Interventions    03/23/2023    3:59 PM  Readmission Risk Prevention Plan  Transportation Screening Complete  PCP or Specialist Appt within 3-5 Days Complete  HRI or Home Care Consult Complete  Social Work Consult for Recovery Care Planning/Counseling Complete  Palliative Care Screening Not Applicable  Medication Review Oceanographer) --

## 2023-03-24 ENCOUNTER — Inpatient Hospital Stay: Payer: Medicare HMO

## 2023-03-24 ENCOUNTER — Encounter: Payer: Self-pay | Admitting: Internal Medicine

## 2023-03-24 ENCOUNTER — Other Ambulatory Visit (HOSPITAL_COMMUNITY): Payer: Self-pay

## 2023-03-24 ENCOUNTER — Telehealth (HOSPITAL_COMMUNITY): Payer: Self-pay | Admitting: Pharmacy Technician

## 2023-03-24 DIAGNOSIS — N1832 Chronic kidney disease, stage 3b: Secondary | ICD-10-CM | POA: Diagnosis not present

## 2023-03-24 DIAGNOSIS — I5033 Acute on chronic diastolic (congestive) heart failure: Secondary | ICD-10-CM | POA: Diagnosis not present

## 2023-03-24 DIAGNOSIS — N184 Chronic kidney disease, stage 4 (severe): Secondary | ICD-10-CM | POA: Diagnosis present

## 2023-03-24 DIAGNOSIS — J9621 Acute and chronic respiratory failure with hypoxia: Secondary | ICD-10-CM | POA: Diagnosis not present

## 2023-03-24 LAB — BASIC METABOLIC PANEL
Anion gap: 8 (ref 5–15)
BUN: 25 mg/dL — ABNORMAL HIGH (ref 8–23)
CO2: 34 mmol/L — ABNORMAL HIGH (ref 22–32)
Calcium: 8.7 mg/dL — ABNORMAL LOW (ref 8.9–10.3)
Chloride: 100 mmol/L (ref 98–111)
Creatinine, Ser: 1.53 mg/dL — ABNORMAL HIGH (ref 0.44–1.00)
GFR, Estimated: 37 mL/min — ABNORMAL LOW (ref >60)
Glucose, Bld: 93 mg/dL (ref 70–99)
Potassium: 3.2 mmol/L — ABNORMAL LOW (ref 3.5–5.1)
Sodium: 142 mmol/L (ref 135–145)

## 2023-03-24 MED ORDER — UMECLIDINIUM-VILANTEROL 62.5-25 MCG/ACT IN AEPB
1.0000 | INHALATION_SPRAY | Freq: Every day | RESPIRATORY_TRACT | Status: DC
  Administered 2023-03-24 – 2023-03-28 (×3): 1 via RESPIRATORY_TRACT
  Filled 2023-03-24: qty 14

## 2023-03-24 MED ORDER — EMPAGLIFLOZIN 10 MG PO TABS
10.0000 mg | ORAL_TABLET | Freq: Every day | ORAL | Status: DC
  Administered 2023-03-24 – 2023-03-28 (×4): 10 mg via ORAL
  Filled 2023-03-24 (×5): qty 1

## 2023-03-24 MED ORDER — BUDESONIDE 0.25 MG/2ML IN SUSP
0.2500 mg | Freq: Two times a day (BID) | RESPIRATORY_TRACT | Status: DC
  Administered 2023-03-24 – 2023-03-28 (×9): 0.25 mg via RESPIRATORY_TRACT
  Filled 2023-03-24 (×9): qty 2

## 2023-03-24 MED ORDER — POTASSIUM CHLORIDE CRYS ER 20 MEQ PO TBCR
40.0000 meq | EXTENDED_RELEASE_TABLET | ORAL | Status: AC
  Administered 2023-03-24 (×2): 40 meq via ORAL
  Filled 2023-03-24 (×2): qty 2

## 2023-03-24 NOTE — Progress Notes (Signed)
Heart Failure Stewardship Pharmacy Note  PCP: Danella Penton, MD PCP-Cardiologist: Lorine Bears, MD  HPI: Misty Adams is a 67 y.o. female with COPD, diastolic CHF, hypertension, dyslipidemia, hypothyroidism and peripheral vascular disease with left subclavian stent placement who presented with worsening dyspnea, orthopnea, and lower extremity edema . BNP was elevated to 554, down from 710.1 during recent admission in November of this year. Echo in 02/2023 showed LVEF of 60-65%, grade II diastolic dysfunction, mild AS.  HS-troponin was 8 on admission. CXR this admission noted interstitial pulmonary edema with small right pleural effusion. After recent admission in November, the patient was taking oral furosemide and was found to be volume up at her PCP appointment. Torsemide was started, but patient reports that this did not significantly increase diuresis.  Pertinent Lab Values: Creatinine  Date Value Ref Range Status  01/21/2023 1.38 (H) 0.44 - 1.00 mg/dL Final  52/84/1324 4.01 0.60 - 1.30 mg/dL Final   Creatinine, Ser  Date Value Ref Range Status  03/24/2023 1.53 (H) 0.44 - 1.00 mg/dL Final   BUN  Date Value Ref Range Status  03/24/2023 25 (H) 8 - 23 mg/dL Final  02/72/5366 15 7 - 18 mg/dL Final   Potassium  Date Value Ref Range Status  03/24/2023 3.2 (L) 3.5 - 5.1 mmol/L Final  09/28/2013 3.9 3.5 - 5.1 mmol/L Final   Sodium  Date Value Ref Range Status  03/24/2023 142 135 - 145 mmol/L Final  09/28/2013 133 (L) 136 - 145 mmol/L Final   B Natriuretic Peptide  Date Value Ref Range Status  03/23/2023 554.0 (H) 0.0 - 100.0 pg/mL Final    Comment:    Performed at [facility], [address] [place], [place] [address]   Magnesium  Date Value Ref Range Status  09/20/2020 1.8 1.7 - 2.4 mg/dL Final    Comment:    Performed at [facility], [address] [place], [place] [address]   Hgb A1c MFr Bld  Date Value Ref Range Status  07/15/2022 7.3 (H)  4.8 - 5.6 % Final    Comment:    (NOTE)         Prediabetes: 5.7 - 6.4         Diabetes: >6.4         Glycemic control for adults with diabetes: <7.0    LDH  Date Value Ref Range Status  12/08/2022 109 98 - 192 U/L Final    Comment:    Performed at [facility], [address] [place], [place] [address]    Vital Signs: Admission weight: none Temp:  [97.3 F (36.3 C)-98.6 F (37 C)] 98.4 F (36.9 C) (12/03 0337) Pulse Rate:  [60-119] 65 (12/03 0337) Cardiac Rhythm: Normal sinus rhythm (12/02 2012) Resp:  [16-24] 20 (12/03 0337) BP: (119-168)/(47-76) 139/47 (12/03 0337) SpO2:  [71 %-94 %] 90 % (12/03 0719) Weight:  [81.6 kg (179 lb 12.8 oz)] 81.6 kg (179 lb 12.8 oz) (12/02 0802)  Intake/Output Summary (Last 24 hours) at 03/24/2023 0753 Last data filed at 03/24/2023 0510 Gross per 24 hour  Intake 1030.54 ml  Output --  Net 1030.54 ml    Current Heart Failure Medications:  Loop diuretic: furosemide 40 mg IV BID Beta-Blocker: carvedilol 12.5 mg BID ACEI/ARB/ARNI: none MRA: none SGLT2i: none   Prior to admission Heart Failure Medications:  Loop diuretic: torsemide 20 mg daily (recent start) Beta-Blocker: carvedilol 12.5 mg BID ACEI/ARB/ARNI: none MRA: none SGLT2i: none Other vasoactive medications include amlodipine  5 mg daily   Assessment: 1. Acute diastolic heart failure (LVEF 60-65%) with grade II diastolic dysfunction, due to unknown etiology. NYHA class II-III symptoms.  -Symptoms: Reports shortness of breath has improved but is not at baseline. LEE has also improved, but still present. Appetite is poor. -Volume: CXR today showed regressed but not resolved pulmonary edema. Urine output and weights are not charted. Creatinine is stable. Recommend continuing IV diuresis today. -Hemodynamics: BP has been significantly elevated since admission, ranging from 140-160s/60s. Would benefit from afterload reduction. -BB: Beta blockers do not have significant benefit  in HFpEF and can worsen exercise capacity. Additionally, it can exacerbate COPD, though no exacerbation to COPD noted thus far thus admission. Can consider decreasing dose with plans to stop or changing to a beta-1-selective BB. -ACEI/ARB/ARNI: Creatinine is close to baseline today. Would benefit from the addition of an ARB for BP and CKD when creatinine stabilizes. -MRA: Given lability of renal function and A1c of 6.7 most recently, may be a better candidate for finerenone outpatient. -SGLT2i: Though this is first line therapy for HFpEF, the patient has an admission for pyelonephritis earlier this year. Given risk of UTI, would attempt to optimize other medications like MRA.  1) Medication changes recommended at this time: -None. Checking cost of finerenone.  2) Patient assistance: -Farxiga $95, Jardiance $45  3) Education: - Patient has been educated on current HF medications and potential additions to HF medication regimen - Patient verbalizes understanding that over the next few months, these medication doses may change and more medications may be added to optimize HF regimen - Patient has been educated on basic disease state pathophysiology and goals of therapy  Medication Assistance / Insurance Benefits Check: Does the patient have prescription insurance?    Type of insurance plan:  Does the patient qualify for medication assistance through manufacturers or grants? Pending   Outpatient Pharmacy[facility]     Please do not hesitate to reach out with questions or concerns,  Enos Fling, PharmD, CPP, BCPS Heart Failure Pharmacist  Phone - [phone] 03/24/2023 10:39 AM

## 2023-03-24 NOTE — Telephone Encounter (Addendum)
Patient Product/process development scientist completed.    The patient is insured through [employer]. Patient has Medicare and is not eligible for a copay card, but may be able to apply for patient assistance, if available.    Ran test claim for Farxiga 10 mg and the current 30 day co-pay is $95.00.  Ran test claim for Jardiance 10 mg and the current 30 day co-pay is $45.00.  Ran test claim for Entresto 24-26 mg and the current 30 day co-pay is $45.00.  Ran test claim for Kerendia 10 mg and Reqires Prior Authorization  This test claim was processed through [employer]- copay amounts may vary at other pharmacies due to [employer], or as the patient moves through the different stages of their insurance plan.     Roland Earl, CPHT Pharmacy Technician III Certified Patient Advocate [facility] Patient Advocate Team Direct Number: [phone]  Fax: [phone]

## 2023-03-24 NOTE — Progress Notes (Addendum)
Cardiology Progress Note   Patient Name: Misty Adams Date of Encounter: 03/24/2023  Primary Cardiologist: Lorine Bears, MD  Subjective   Breathing only slightly improved.  Remains on oxygen at 5 L/min.  Notes good urine output and reduced lower extremity edema. Objective   Inpatient Medications    Scheduled Meds:  aspirin EC  81 mg Oral QHS   atorvastatin  40 mg Oral QHS   budesonide (PULMICORT) nebulizer solution  0.25 mg Nebulization BID   carvedilol  12.5 mg Oral BID WC   famotidine  40 mg Oral QHS   ferrous sulfate  325 mg Oral Q breakfast   furosemide  40 mg Intravenous BID   gabapentin  200 mg Oral QHS   heparin  5,000 Units Subcutaneous Q12H   levothyroxine  125 mcg Oral Q0600   pantoprazole  40 mg Oral BID   sodium chloride flush  3 mL Intravenous Q12H   umeclidinium-vilanterol  1 puff Inhalation Daily   venlafaxine XR  150 mg Oral Daily   Continuous Infusions:  PRN Meds: acetaminophen, albuterol, ALPRAZolam, guaiFENesin-dextromethorphan, hydrALAZINE, ondansetron, sodium chloride flush, traZODone   Vital Signs    Vitals:   03/23/23 2337 03/24/23 0337 03/24/23 0719 03/24/23 0900  BP: (!) 119/51 (!) 139/47  (!) 143/70  Pulse: 74 65  69  Resp: (!) 22 20  16   Temp: 98.2 F (36.8 C) 98.4 F (36.9 C)  97.9 F (36.6 C)  TempSrc:    Oral  SpO2: 90% 90% 90% 92%    Intake/Output Summary (Last 24 hours) at 03/24/2023 1506 Last data filed at 03/24/2023 0510 Gross per 24 hour  Intake 1030.54 ml  Output --  Net 1030.54 ml   There were no vitals filed for this visit.  Physical Exam   GEN: Well nourished, well developed, in no acute distress.  HEENT: Grossly normal.  Neck: Supple, no bruits or masses.  Moderately elevated JVD. Cardiac: RRR, 2/6 systolic murmur throughout. No clubbing, cyanosis, edema.  Radials 2+, DP/PT 2+ and equal bilaterally.  Respiratory:  Respirations regular and unlabored, crackles a third the way up bilaterally with faint  expiratory wheezing at apices. GI: Soft, nontender, nondistended, BS + x 4. MS: no deformity or atrophy. Skin: warm and dry, no rash. Neuro:  Strength and sensation are intact. Psych: AAOx3.  Normal affect.  Labs    Chemistry Recent Labs  Lab 03/23/23 0905 03/24/23 0503  NA 141 142  K 4.7 3.2*  CL 104 100  CO2 30 34*  GLUCOSE 110* 93  BUN 26* 25*  CREATININE 1.51* 1.53*  CALCIUM 9.3 8.7*  GFRNONAA 37* 37*  ANIONGAP 7 8     Hematology Recent Labs  Lab 03/23/23 0905  WBC 6.6  RBC 3.76*  HGB 9.2*  HCT 32.8*  MCV 87.2  MCH 24.5*  MCHC 28.0*  RDW 21.2*  PLT 254    Cardiac Enzymes  Recent Labs  Lab 03/08/23 2325 03/09/23 0648 03/23/23 0905  TROPONINIHS 18* 38* 8      BNP    Component Value Date/Time   BNP 554.0 (H) 03/23/2023 0905   HbA1c  Lab Results  Component Value Date   HGBA1C 7.3 (H) 07/15/2022    Radiology    DG Chest 1 View  Result Date: 03/24/2023 CLINICAL DATA:  67 year old female with CHF.  Weakness. EXAM: CHEST  1 VIEW COMPARISON:  Chest radiographs 03/23/2023 and earlier. FINDINGS: Portable AP upright view at 0701 hours. Stable lung volumes and mediastinal contours.  Small right pleural effusion persists. Pulmonary vascularity remains increased although mildly regressed from yesterday. No pneumothorax. No consolidation. Visualized tracheal air column is within normal limits. No acute osseous abnormality identified. IMPRESSION: 1. Regressed but not resolved pulmonary edema. Stable small right pleural effusion. 2. No new cardiopulmonary abnormality. Electronically Signed   By: Odessa Fleming M.D.   On: 03/24/2023 08:28   DG Chest 2 View  Result Date: 03/23/2023 CLINICAL DATA:  Shortness of breath. EXAM: CHEST - 2 VIEW COMPARISON:  Chest radiograph dated March 08, 2023. FINDINGS: Stable cardiomegaly. The mediastinal contours are within normal limits. Aortic atherosclerosis. Vascular stent in place. Bilateral basilar predominant interstitial  opacities. Small right pleural effusion with basilar atelectasis. No pneumothorax. No acute osseous abnormality. IMPRESSION: 1. Cardiomegaly with findings suggestive of interstitial pulmonary edema. 2. Small right pleural effusion. Electronically Signed   By: Hart Robinsons M.D.   On: 03/23/2023 09:29     Telemetry    rsr - Personally Reviewed  Cardiac Studies   2D Echocardiogram 11.18.2024  1. Left ventricular ejection fraction, by estimation, is 60 to 65%. The  left ventricle has normal function. The left ventricle has no regional  wall motion abnormalities. Left ventricular diastolic parameters are  consistent with Grade II diastolic  dysfunction (pseudonormalization).   2. Right ventricular systolic function is normal. The right ventricular  size is normal. Tricuspid regurgitation signal is inadequate for assessing  PA pressure.   3. Left atrial size was mildly dilated.   4. The mitral valve is abnormal. No evidence of mitral valve  regurgitation. Mild mitral stenosis. The mean mitral valve gradient is 6.0  mmHg. Moderate mitral annular calcification.   5. The aortic valve is normal in structure. Aortic valve regurgitation is  not visualized. Mild aortic valve stenosis. Aortic valve mean gradient  measures 9.7 mmHg.   6. The inferior vena cava is normal in size with greater than 50%  respiratory variability, suggesting right atrial pressure of 3 mmHg.   Patient Profile     67 y.o. female with a history of HFpEF, HTN, HL, subclavian stenosis, chronic venous insuff, COPD, ILD, GERD, hypothyroidism, CKD IIIb, and tob abuse, who was readmitted on December 2 with HFpEF.  Assessment & Plan    1.  Acute on chronic heart failure with preserved ejection fraction: Patient admitted last month with volume overload and dyspnea.  EF 60-65% on echo at that time.  She bumped her creatinine after 2 doses of Lasix at that time and was placed on torsemide as an outpatient with worsening volume  prompting readmission.  BNP 554 in the ER and chest x-ray with edema.  She has responded well to intravenous Lasix though I's and O's are incomplete.  Breathing only somewhat better.  She remains on 5 L of O2 with ongoing evidence of volume overload.  Renal function relatively stable with a creatinine of 1.53.  Continue IV diuresis.  Heart rate stable.  Blood pressure variable from the 1 teens to the 160s yesterday.  143/70 this morning.  Previously on amlodipine at home, discontinued in the setting of lower extremity edema.  If renal function remains stable, would look to add an ARB.  With HFpEF and readmission, will add SGLT2 inhibitor.  2.  Primary hypertension: As above, pressures variable from the 1 teens to 150s.  Follow-up this afternoon after a.m. medications and with additional diuresis.  Previously on amlodipine which was discontinued in the setting of lower extremity swelling.  Consider ARB if renal function  stable.  3.  History of demand ischemia: No history of chest pain.  Troponin up to 38 on her last admission.  Prior history of PAD.  Will need outpatient Lexiscan PET/CT.  4.  Hyperlipidemia: Continue statin.  5.  Tobacco abuse/COPD/interstitial lung disease: Still smoking 1 pack a day.  Cessation advised.  Likely contributing to dyspnea in the setting of low reserve.  6.  Stage III chronic kidney disease: Stable.  Follow with diuresis.  Will benefit from SGLT2 inhibitor as well as an ARB provided that creatinine stable with diuresis.  7.  Hypokalemia: Supplementation ordered.  Signed, Nicolasa Ducking, NP  03/24/2023, 3:06 PM    For questions or updates, please contact   Please consult [email or web address] for contact info under Cardiology/STEMI.

## 2023-03-24 NOTE — Progress Notes (Signed)
Education Assessment and Provision:  Review of heart failure disease management with recent hospital admission on 03/10/23. Including the following detailed education below and instructions.   Signs and symptoms of Heart Failure When to call the physician Importance of daily weights Low sodium diet Fluid restriction Medication management Anticipated future follow-up appointments- New TOC appointment scheduled for 04/01/23 @ 1:30 pm.  Pt aware   Patient education given on each of the above topics.  Patient acknowledges understanding via teach back method and acceptance of all instructions.  Education Materials:  "Living Better With Heart Failure" Booklet, HF zone tool, & Daily Weight Tracker Tool.  Patient has scale at home: Yes Patient has pill box at home: Yes  Roxy Horseman, RN, BSN [facility] Heart Failure Navigator Secure Chat Only

## 2023-03-24 NOTE — Discharge Instructions (Signed)

## 2023-03-24 NOTE — Plan of Care (Signed)

## 2023-03-24 NOTE — Progress Notes (Signed)
  Progress Note   Patient: Misty Adams ZOX:[id number] DOB: Nov 12, 1955 DOA: 03/23/2023     1 DOS: the patient was seen and examined on 03/24/2023   Brief hospital course: Misty Adams is a 67 y.o. female with medical history significant of chronic HFpEF, COPD, chronic hypoxic respiratory failure on nocturnal O2 2 L, HTN, CKD stage IIIb, chronic iron deficiency anemia, HLD, hypothyroidism, PVD, presented with worsening of shortness of breath, leg swelling.  She also had a significant weight gain.  Upon arriving the hospital, she developed hypoxemia, chronic on 2 L oxygen, was placed on 5 L oxygen here.  She also has a moderate elevation BNP, she was placed on IV Lasix.  Cardiology consult obtained.   Principal Problem:   CHF (congestive heart failure) (HCC) Active Problems:   Acute on chronic heart failure with preserved ejection fraction (HFpEF) (HCC)   Chronic obstructive pulmonary disease (HCC)   Essential hypertension   Hyperlipidemia, unspecified   GERD without esophagitis   Hypokalemia   Acute on chronic respiratory failure with hypoxemia (HCC)   Chronic kidney disease, stage 3b (HCC)   Assessment and Plan: Acute on chronic hypoxemic respiratory failure secondary to CHF exacerbation. Acute on chronic diastolic congestive heart failure. Patient has a classical presentation of exacerbation congestive heart failure with orthopnea, weight gain, leg edema.  She also had elevated BNP, chest x-ray showed evidence of pulmonary edema with pleural effusion. Recent echocardiogram performed on 03/08/2023 showed ejection fraction 60 to 65% with grade 2 diastolic dysfunction. Cardiology consult obtained, appreciate help. Continue diuretics.  Essential hypertension. Continues on home medicines.  Chronic kidney disease stage IIIb. Renal function still stable, continue to follow.  COPD. Condition stable.      Subjective:  Still has secondary short of breath with min exertion,  still requiring 5 L oxygen.  Physical Exam: Vitals:   03/23/23 2337 03/24/23 0337 03/24/23 0719 03/24/23 0900  BP: (!) 119/51 (!) 139/47  (!) 143/70  Pulse: 74 65  69  Resp: (!) 22 20  16   Temp: 98.2 F (36.8 C) 98.4 F (36.9 C)  97.9 F (36.6 C)  TempSrc:    Oral  SpO2: 90% 90% 90% 92%   General exam: Appears calm and comfortable  Respiratory system: Decreased breath sounds with crackles in the bases. Respiratory effort normal. Cardiovascular system: S1 & S2 heard, RRR. No JVD, murmurs, rubs, gallops or clicks.  Gastrointestinal system: Abdomen is nondistended, soft and nontender. No organomegaly or masses felt. Normal bowel sounds heard. Central nervous system: Alert and oriented. No focal neurological deficits. Extremities: 1+ leg edema Skin: No rashes, lesions or ulcers Psychiatry: Judgement and insight appear normal. Mood & affect appropriate.    Data Reviewed:  Reviewed prior echo results, chest results, lab results.  Family Communication: None  Disposition: Status is: Inpatient Remains inpatient appropriate because: Severity of disease, IV treatment.     Time spent: 35 minutes  Author: Marrion Coy, MD 03/24/2023 2:42 PM  For on call review [email or web address].

## 2023-03-24 NOTE — Hospital Course (Addendum)
Misty Adams is a 67 y.o. female with medical history significant of chronic HFpEF, COPD, chronic hypoxic respiratory failure on nocturnal O2 2 L, HTN, CKD stage IIIb, chronic iron deficiency anemia, HLD, hypothyroidism, PVD, presented with worsening of shortness of breath, leg swelling.  She also had a significant weight gain.  Upon arriving the hospital, she developed hypoxemia, chronic on 2 L oxygen, was placed on 5 L oxygen here.  She also has a moderate elevation BNP, she was placed on IV Lasix.  Cardiology consult obtained.

## 2023-03-25 DIAGNOSIS — I5020 Unspecified systolic (congestive) heart failure: Secondary | ICD-10-CM

## 2023-03-25 DIAGNOSIS — J449 Chronic obstructive pulmonary disease, unspecified: Secondary | ICD-10-CM

## 2023-03-25 DIAGNOSIS — J9601 Acute respiratory failure with hypoxia: Secondary | ICD-10-CM | POA: Diagnosis not present

## 2023-03-25 DIAGNOSIS — J9621 Acute and chronic respiratory failure with hypoxia: Secondary | ICD-10-CM | POA: Diagnosis not present

## 2023-03-25 DIAGNOSIS — I509 Heart failure, unspecified: Secondary | ICD-10-CM

## 2023-03-25 DIAGNOSIS — I272 Pulmonary hypertension, unspecified: Secondary | ICD-10-CM | POA: Diagnosis present

## 2023-03-25 LAB — BASIC METABOLIC PANEL
Anion gap: 11 (ref 5–15)
BUN: 28 mg/dL — ABNORMAL HIGH (ref 8–23)
CO2: 31 mmol/L (ref 22–32)
Calcium: 8.7 mg/dL — ABNORMAL LOW (ref 8.9–10.3)
Chloride: 98 mmol/L (ref 98–111)
Creatinine, Ser: 1.69 mg/dL — ABNORMAL HIGH (ref 0.44–1.00)
GFR, Estimated: 32 mL/min — ABNORMAL LOW (ref >60)
Glucose, Bld: 88 mg/dL (ref 70–99)
Potassium: 3.6 mmol/L (ref 3.5–5.1)
Sodium: 140 mmol/L (ref 135–145)

## 2023-03-25 LAB — MAGNESIUM: Magnesium: 2.3 mg/dL (ref 1.7–2.4)

## 2023-03-25 MED ORDER — SALINE SPRAY 0.65 % NA SOLN: 1.0000 | NASAL | Status: DC | PRN

## 2023-03-25 MED ORDER — BISOPROLOL FUMARATE 5 MG PO TABS
5.0000 mg | ORAL_TABLET | Freq: Every day | ORAL | Status: DC
  Administered 2023-03-25 – 2023-03-28 (×3): 5 mg via ORAL
  Filled 2023-03-25 (×4): qty 1

## 2023-03-25 NOTE — H&P (View-Only) (Signed)
Cardiology Progress Note   Patient Name: Misty Adams Date of Encounter: 03/25/2023  Primary Cardiologist: Lorine Bears, MD  Subjective   Breathing minimally improved.  Notes good response to lasix, though didn't save urine, so I/O inaccurate. Remains on O2 @ 5lpm (baseline 2lpm).  No c/p.  Occas cough. Objective   Inpatient Medications    Scheduled Meds:  aspirin EC  81 mg Oral QHS   atorvastatin  40 mg Oral QHS   bisoprolol  5 mg Oral Daily   budesonide (PULMICORT) nebulizer solution  0.25 mg Nebulization BID   empagliflozin  10 mg Oral Daily   famotidine  40 mg Oral QHS   ferrous sulfate  325 mg Oral Q breakfast   gabapentin  200 mg Oral QHS   heparin  5,000 Units Subcutaneous Q12H   levothyroxine  125 mcg Oral Q0600   pantoprazole  40 mg Oral BID   sodium chloride flush  3 mL Intravenous Q12H   umeclidinium-vilanterol  1 puff Inhalation Daily   venlafaxine XR  150 mg Oral Daily   Continuous Infusions:  PRN Meds: acetaminophen, albuterol, ALPRAZolam, guaiFENesin-dextromethorphan, ondansetron, sodium chloride flush, traZODone   Vital Signs    Vitals:   03/25/23 0432 03/25/23 0716 03/25/23 0732 03/25/23 1219  BP:   (!) 125/57 (!) 125/50  Pulse:   (!) 58 (!) 51  Resp:   20 15  Temp:   97.7 F (36.5 C) 97.8 F (36.6 C)  TempSrc:      SpO2:  94% 94% 95%  Weight: 79 kg       Intake/Output Summary (Last 24 hours) at 03/25/2023 1405 Last data filed at 03/25/2023 1024 Gross per 24 hour  Intake 240 ml  Output 110 ml  Net 130 ml   Filed Weights   03/25/23 0432  Weight: 79 kg    Physical Exam   GEN: Well nourished, well developed, in no acute distress.  HEENT: Grossly normal.  Neck: Supple, no JVD, carotid bruits, or masses. Cardiac: RRR, 2/6 syst murmur throughout, no rubse/gallops, no clubbing, cyanosis, edema.  Radials 2+, DP/PT 2+ and equal bilaterally.  Respiratory:  Respirations regular and unlabored, diminished breath sounds bilaterally with  crackles one third of the way up. GI: Soft, nontender, nondistended, BS + x 4. MS: no deformity or atrophy. Skin: warm and dry, no rash. Neuro:  Strength and sensation are intact. Psych: AAOx3.  Normal affect.  Labs    Chemistry Recent Labs  Lab 03/23/23 0905 03/24/23 0503 03/25/23 0506  NA 141 142 140  K 4.7 3.2* 3.6  CL 104 100 98  CO2 30 34* 31  GLUCOSE 110* 93 88  BUN 26* 25* 28*  CREATININE 1.51* 1.53* 1.69*  CALCIUM 9.3 8.7* 8.7*  GFRNONAA 37* 37* 32*  ANIONGAP 7 8 11      Hematology Recent Labs  Lab 03/23/23 0905  WBC 6.6  RBC 3.76*  HGB 9.2*  HCT 32.8*  MCV 87.2  MCH 24.5*  MCHC 28.0*  RDW 21.2*  PLT 254    Cardiac Enzymes  Recent Labs  Lab 03/08/23 2325 03/09/23 0648 03/23/23 0905  TROPONINIHS 18* 38* 8      BNP    Component Value Date/Time   BNP 554.0 (H) 03/23/2023 0905    HbA1c  Lab Results  Component Value Date   HGBA1C 7.3 (H) 07/15/2022    Radiology    DG Chest 1 View  Result Date: 03/24/2023 CLINICAL DATA:  67 year old female with CHF.  Weakness. EXAM: CHEST  1 VIEW COMPARISON:  Chest radiographs 03/23/2023 and earlier. FINDINGS: Portable AP upright view at 0701 hours. Stable lung volumes and mediastinal contours. Small right pleural effusion persists. Pulmonary vascularity remains increased although mildly regressed from yesterday. No pneumothorax. No consolidation. Visualized tracheal air column is within normal limits. No acute osseous abnormality identified. IMPRESSION: 1. Regressed but not resolved pulmonary edema. Stable small right pleural effusion. 2. No new cardiopulmonary abnormality. Electronically Signed   By: Odessa Fleming M.D.   On: 03/24/2023 08:28   DG Chest 2 View  Result Date: 03/23/2023 CLINICAL DATA:  Shortness of breath. EXAM: CHEST - 2 VIEW COMPARISON:  Chest radiograph dated March 08, 2023. FINDINGS: Stable cardiomegaly. The mediastinal contours are within normal limits. Aortic atherosclerosis. Vascular stent in  place. Bilateral basilar predominant interstitial opacities. Small right pleural effusion with basilar atelectasis. No pneumothorax. No acute osseous abnormality. IMPRESSION: 1. Cardiomegaly with findings suggestive of interstitial pulmonary edema. 2. Small right pleural effusion. Electronically Signed   By: Hart Robinsons M.D.   On: 03/23/2023 09:29     Telemetry    Sinus bradycardia/regular sinus rhythm- Personally Reviewed  Cardiac Studies   2D Echocardiogram 11.18.2024   1. Left ventricular ejection fraction, by estimation, is 60 to 65%. The  left ventricle has normal function. The left ventricle has no regional  wall motion abnormalities. Left ventricular diastolic parameters are  consistent with Grade II diastolic  dysfunction (pseudonormalization).   2. Right ventricular systolic function is normal. The right ventricular  size is normal. Tricuspid regurgitation signal is inadequate for assessing  PA pressure.   3. Left atrial size was mildly dilated.   4. The mitral valve is abnormal. No evidence of mitral valve  regurgitation. Mild mitral stenosis. The mean mitral valve gradient is 6.0  mmHg. Moderate mitral annular calcification.   5. The aortic valve is normal in structure. Aortic valve regurgitation is  not visualized. Mild aortic valve stenosis. Aortic valve mean gradient  measures 9.7 mmHg.   6. The inferior vena cava is normal in size with greater than 50%  respiratory variability, suggesting right atrial pressure of 3 mmHg.   Patient Profile     67 y.o. female with a history of HFpEF, HTN, HL, subclavian stenosis, chronic venous insuff, COPD, ILD, GERD, hypothyroidism, CKD IIIb, and tob abuse, who was readmitted on December 2 with HFpEF.   Assessment & Plan    1.  Acute on chronic HFpEF:  Patient admitted last month with volume overload and dyspnea. EF 60-65% on echo at that time. She bumped her creatinine after 2 doses of Lasix at that time and was placed on  torsemide as an outpatient with worsening volume prompting readmission. BNP 554 in the ER and chest x-ray with edema.  She has responded well to IV lasix, by her report, though no output recorded since admission. BUN/creat rising w/ diuresis, similar to last admission - 28/1.69.  Appears euvolemic however she remains very dyspneic and cont to require O2 @ 5lpm.  Crackles on exam, though also carries a h/o ILD.  No c/p.  Holding lasix this AM.  Changing ? blocker from carvedilol to bisoprolol in setting of lung dzs.  Pulm HTN noted on echo w/ dilated PA on CTs in the past.  If renal fxn stable, will plan on R & L heart cath tomorrow afternoon (just RHC if renal fxn remains abnl), which will help to guide diuretic therapy and eval cors.  Concerned that HF exacerbations  may be related to ischemia.  Cont sglt2i.  2.  HTN:  stable.  Follow w/ switch from carvedilol to bisoprolol.  3.  H/o demand ischemia:  hsTrop up to 38 on last admission.  As above, w/ recurrent HF, h/o PAD, cor Ca2+, provided renal fxn stable in AM, will plan cath tomorrow afternoon.  The patient understands that risks include but are not limited to stroke (1 in 1000), death (1 in 1000), kidney failure [usually temporary] (1 in 500), bleeding (1 in 200), allergic reaction [possibly serious] (1 in 200), and agrees to proceed.    4.  Tob abuse/COPD/ILD:  was still smoking 1ppd prior to admission.  Cessation advised.  Certainly contributing to dyspnea in the setting of reduced pulmonary reserve and volume excess.  Right heart cath tomorrow.  5.  Hyperlipidemia: Continue statin.  6.  Acute on chronic stage III kidney disease: BUN and creatinine up this morning in the setting of diuresis.  Lasix on hold.  She is now on SGLT2 inhibitor.  7.  Hypokalemia: Stable this morning.  Signed, Nicolasa Ducking, NP  03/25/2023, 2:05 PM    For questions or updates, please contact   Please consult [email or web address] for contact info under Cardiology/STEMI.

## 2023-03-25 NOTE — Progress Notes (Addendum)
Progress Note   Patient: Misty Adams EXB:[id number] DOB: 11-06-1955 DOA: 03/23/2023     2 DOS: the patient was seen and examined on 03/25/2023   Brief hospital course: ALYSHIA BARSZCZ is a 67 y.o. female with medical history significant of chronic HFpEF, COPD, chronic hypoxic respiratory failure on nocturnal O2 2L at baseline, HTN, CKD stage IIIb, chronic iron deficiency anemia, HLD, hypothyroidism, PVD. They presented with worsening of shortness of breath, leg swelling.  She also had a significant weight gain.  Upon arriving the hospital, she developed hypoxemia, was placed on 5 L oxygen.  She also has a moderate elevation BNP, she was placed on IV Lasix.  Cardiology consult obtained.  She has shown gradual improvement with diuretic care and has weaned to 3Lnc. As she continues to have good UOP, would predict stable for dc in a day or two. Cards considering cath 12/5. Encourage to be out of bed.   Principal Problem:   CHF (congestive heart failure) (HCC) Active Problems:   Acute on chronic heart failure with preserved ejection fraction (HFpEF) (HCC)   Chronic obstructive pulmonary disease (HCC)   Essential hypertension   Hyperlipidemia, unspecified   GERD without esophagitis   Hypokalemia   Acute on chronic respiratory failure with hypoxemia (HCC)   Chronic kidney disease, stage 3b (HCC)   Assessment and Plan: Acute on chronic hypoxemic respiratory failure secondary to CHF exacerbation. Acute on chronic diastolic congestive heart failure. Patient has a classical presentation of exacerbation congestive heart failure with orthopnea, weight gain, leg edema.  She also had elevated BNP, chest x-ray showed evidence of pulmonary edema with pleural effusion. Recent echocardiogram performed on 03/08/2023 showed ejection fraction 60 to 65% with grade 2 diastolic dysfunction. Cardiology consult obtained, appreciate help.  - holding lasix  - changing to bisoprolol  - tentatively planning  left and right heart cath 12/5 if renal function remains stable  - continue sglt2i Continue diuretics.  Essential hypertension. Continues on home medicines.  PAD  CAD- denies chest pain - continue ASA, statin - cath tomorrow  Hypokalemia- s/p replacement - monitor while on diuretics  Chronic kidney disease stage IIIb. Renal function still stable, continue to follow.  COPD  current tobacco user - inhalers PRN and scheduled - nicotine replacement and counseling  Hypothyroidism- - continue home therapy  Depression  anxiety  insomnia- - continue home therapy  Subjective:  Feeling improved overall. Denies cough or chest pain. No dyspnea at rest. Has not been up moving much.   Physical Exam: Vitals:   03/25/23 0343 03/25/23 0432 03/25/23 0716 03/25/23 0732  BP: (!) 151/59   (!) 125/57  Pulse: 66   (!) 58  Resp: 17   20  Temp: 97.8 F (36.6 C)   97.7 F (36.5 C)  TempSrc:      SpO2: 92%  94% 94%  Weight:  79 kg     General exam: Appears calm and comfortable  Respiratory system: Decreased breath sounds with crackles in the bases. Respiratory effort normal. Cardiovascular system: S1 & S2 heard, RRR. No JVD, murmurs, rubs, gallops or clicks.  Gastrointestinal system: Abdomen is nondistended, soft and nontender. No organomegaly or masses felt. Normal bowel sounds heard. Central nervous system: Alert and oriented. No focal neurological deficits. Extremities: 1+ leg edema Skin: No rashes, lesions or ulcers Psychiatry: Judgement and insight appear normal. Mood & affect appropriate.    Data Reviewed:  Reviewed prior echo results, chest results, lab results.  Family Communication: None  Disposition:  Status is: Inpatient Remains inpatient appropriate because: Severity of disease, IV treatment.     Time spent: 35 minutes  Author: Leeroy Bock, MD 03/25/2023 7:54 AM  For on call review [email or web address].

## 2023-03-25 NOTE — Plan of Care (Signed)

## 2023-03-25 NOTE — Progress Notes (Signed)
Cardiology Progress Note   Patient Name: Misty Adams Date of Encounter: 03/25/2023  Primary Cardiologist: Lorine Bears, MD  Subjective   Breathing minimally improved.  Notes good response to lasix, though didn't save urine, so I/O inaccurate. Remains on O2 @ 5lpm (baseline 2lpm).  No c/p.  Occas cough. Objective   Inpatient Medications    Scheduled Meds:  aspirin EC  81 mg Oral QHS   atorvastatin  40 mg Oral QHS   bisoprolol  5 mg Oral Daily   budesonide (PULMICORT) nebulizer solution  0.25 mg Nebulization BID   empagliflozin  10 mg Oral Daily   famotidine  40 mg Oral QHS   ferrous sulfate  325 mg Oral Q breakfast   gabapentin  200 mg Oral QHS   heparin  5,000 Units Subcutaneous Q12H   levothyroxine  125 mcg Oral Q0600   pantoprazole  40 mg Oral BID   sodium chloride flush  3 mL Intravenous Q12H   umeclidinium-vilanterol  1 puff Inhalation Daily   venlafaxine XR  150 mg Oral Daily   Continuous Infusions:  PRN Meds: acetaminophen, albuterol, ALPRAZolam, guaiFENesin-dextromethorphan, ondansetron, sodium chloride flush, traZODone   Vital Signs    Vitals:   03/25/23 0432 03/25/23 0716 03/25/23 0732 03/25/23 1219  BP:   (!) 125/57 (!) 125/50  Pulse:   (!) 58 (!) 51  Resp:   20 15  Temp:   97.7 F (36.5 C) 97.8 F (36.6 C)  TempSrc:      SpO2:  94% 94% 95%  Weight: 79 kg       Intake/Output Summary (Last 24 hours) at 03/25/2023 1405 Last data filed at 03/25/2023 1024 Gross per 24 hour  Intake 240 ml  Output 110 ml  Net 130 ml   Filed Weights   03/25/23 0432  Weight: 79 kg    Physical Exam   GEN: Well nourished, well developed, in no acute distress.  HEENT: Grossly normal.  Neck: Supple, no JVD, carotid bruits, or masses. Cardiac: RRR, 2/6 syst murmur throughout, no rubse/gallops, no clubbing, cyanosis, edema.  Radials 2+, DP/PT 2+ and equal bilaterally.  Respiratory:  Respirations regular and unlabored, diminished breath sounds bilaterally with  crackles one third of the way up. GI: Soft, nontender, nondistended, BS + x 4. MS: no deformity or atrophy. Skin: warm and dry, no rash. Neuro:  Strength and sensation are intact. Psych: AAOx3.  Normal affect.  Labs    Chemistry Recent Labs  Lab 03/23/23 0905 03/24/23 0503 03/25/23 0506  NA 141 142 140  K 4.7 3.2* 3.6  CL 104 100 98  CO2 30 34* 31  GLUCOSE 110* 93 88  BUN 26* 25* 28*  CREATININE 1.51* 1.53* 1.69*  CALCIUM 9.3 8.7* 8.7*  GFRNONAA 37* 37* 32*  ANIONGAP 7 8 11      Hematology Recent Labs  Lab 03/23/23 0905  WBC 6.6  RBC 3.76*  HGB 9.2*  HCT 32.8*  MCV 87.2  MCH 24.5*  MCHC 28.0*  RDW 21.2*  PLT 254    Cardiac Enzymes  Recent Labs  Lab 03/08/23 2325 03/09/23 0648 03/23/23 0905  TROPONINIHS 18* 38* 8      BNP    Component Value Date/Time   BNP 554.0 (H) 03/23/2023 0905    HbA1c  Lab Results  Component Value Date   HGBA1C 7.3 (H) 07/15/2022    Radiology    DG Chest 1 View  Result Date: 03/24/2023 CLINICAL DATA:  67 year old female with CHF.  Weakness. EXAM: CHEST  1 VIEW COMPARISON:  Chest radiographs 03/23/2023 and earlier. FINDINGS: Portable AP upright view at 0701 hours. Stable lung volumes and mediastinal contours. Small right pleural effusion persists. Pulmonary vascularity remains increased although mildly regressed from yesterday. No pneumothorax. No consolidation. Visualized tracheal air column is within normal limits. No acute osseous abnormality identified. IMPRESSION: 1. Regressed but not resolved pulmonary edema. Stable small right pleural effusion. 2. No new cardiopulmonary abnormality. Electronically Signed   By: Odessa Fleming M.D.   On: 03/24/2023 08:28   DG Chest 2 View  Result Date: 03/23/2023 CLINICAL DATA:  Shortness of breath. EXAM: CHEST - 2 VIEW COMPARISON:  Chest radiograph dated March 08, 2023. FINDINGS: Stable cardiomegaly. The mediastinal contours are within normal limits. Aortic atherosclerosis. Vascular stent in  place. Bilateral basilar predominant interstitial opacities. Small right pleural effusion with basilar atelectasis. No pneumothorax. No acute osseous abnormality. IMPRESSION: 1. Cardiomegaly with findings suggestive of interstitial pulmonary edema. 2. Small right pleural effusion. Electronically Signed   By: Hart Robinsons M.D.   On: 03/23/2023 09:29     Telemetry    Sinus bradycardia/regular sinus rhythm- Personally Reviewed  Cardiac Studies   2D Echocardiogram 11.18.2024   1. Left ventricular ejection fraction, by estimation, is 60 to 65%. The  left ventricle has normal function. The left ventricle has no regional  wall motion abnormalities. Left ventricular diastolic parameters are  consistent with Grade II diastolic  dysfunction (pseudonormalization).   2. Right ventricular systolic function is normal. The right ventricular  size is normal. Tricuspid regurgitation signal is inadequate for assessing  PA pressure.   3. Left atrial size was mildly dilated.   4. The mitral valve is abnormal. No evidence of mitral valve  regurgitation. Mild mitral stenosis. The mean mitral valve gradient is 6.0  mmHg. Moderate mitral annular calcification.   5. The aortic valve is normal in structure. Aortic valve regurgitation is  not visualized. Mild aortic valve stenosis. Aortic valve mean gradient  measures 9.7 mmHg.   6. The inferior vena cava is normal in size with greater than 50%  respiratory variability, suggesting right atrial pressure of 3 mmHg.   Patient Profile     67 y.o. female with a history of HFpEF, HTN, HL, subclavian stenosis, chronic venous insuff, COPD, ILD, GERD, hypothyroidism, CKD IIIb, and tob abuse, who was readmitted on December 2 with HFpEF.   Assessment & Plan    1.  Acute on chronic HFpEF:  Patient admitted last month with volume overload and dyspnea. EF 60-65% on echo at that time. She bumped her creatinine after 2 doses of Lasix at that time and was placed on  torsemide as an outpatient with worsening volume prompting readmission. BNP 554 in the ER and chest x-ray with edema.  She has responded well to IV lasix, by her report, though no output recorded since admission. BUN/creat rising w/ diuresis, similar to last admission - 28/1.69.  Appears euvolemic however she remains very dyspneic and cont to require O2 @ 5lpm.  Crackles on exam, though also carries a h/o ILD.  No c/p.  Holding lasix this AM.  Changing ? blocker from carvedilol to bisoprolol in setting of lung dzs.  Pulm HTN noted on echo w/ dilated PA on CTs in the past.  If renal fxn stable, will plan on R & L heart cath tomorrow afternoon (just RHC if renal fxn remains abnl), which will help to guide diuretic therapy and eval cors.  Concerned that HF exacerbations  may be related to ischemia.  Cont sglt2i.  2.  HTN:  stable.  Follow w/ switch from carvedilol to bisoprolol.  3.  H/o demand ischemia:  hsTrop up to 38 on last admission.  As above, w/ recurrent HF, h/o PAD, cor Ca2+, provided renal fxn stable in AM, will plan cath tomorrow afternoon.  The patient understands that risks include but are not limited to stroke (1 in 1000), death (1 in 1000), kidney failure [usually temporary] (1 in 500), bleeding (1 in 200), allergic reaction [possibly serious] (1 in 200), and agrees to proceed.    4.  Tob abuse/COPD/ILD:  was still smoking 1ppd prior to admission.  Cessation advised.  Certainly contributing to dyspnea in the setting of reduced pulmonary reserve and volume excess.  Right heart cath tomorrow.  5.  Hyperlipidemia: Continue statin.  6.  Acute on chronic stage III kidney disease: BUN and creatinine up this morning in the setting of diuresis.  Lasix on hold.  She is now on SGLT2 inhibitor.  7.  Hypokalemia: Stable this morning.  Signed, Nicolasa Ducking, NP  03/25/2023, 2:05 PM    For questions or updates, please contact   Please consult [email or web address] for contact info under Cardiology/STEMI.

## 2023-03-25 NOTE — Progress Notes (Signed)
Heart Failure Stewardship Pharmacy Note  PCP: Danella Penton, MD PCP-Cardiologist: Lorine Bears, MD  HPI: Misty Adams is a 68 y.o. female with COPD, diastolic CHF, hypertension, dyslipidemia, hypothyroidism and peripheral vascular disease with left subclavian stent placement who presented with worsening dyspnea, orthopnea, and lower extremity edema . BNP was elevated to 554, down from 710.1 during recent admission in November of this year. Echo in 02/2023 showed LVEF of 60-65%, grade II diastolic dysfunction, mild AS.  HS-troponin was 8 on admission. CXR this admission noted interstitial pulmonary edema with small right pleural effusion. After recent admission in November, the patient was taking oral furosemide and was found to be volume up at her PCP appointment. Torsemide was started, but patient reports that this did not significantly increase diuresis.  Pertinent Lab Values: Creatinine  Date Value Ref Range Status  01/21/2023 1.38 (H) 0.44 - 1.00 mg/dL Final  40/98/1191 4.78 0.60 - 1.30 mg/dL Final   Creatinine, Ser  Date Value Ref Range Status  03/25/2023 1.69 (H) 0.44 - 1.00 mg/dL Final   BUN  Date Value Ref Range Status  03/25/2023 28 (H) 8 - 23 mg/dL Final  29/56/2130 15 7 - 18 mg/dL Final   Potassium  Date Value Ref Range Status  03/25/2023 3.6 3.5 - 5.1 mmol/L Final  09/28/2013 3.9 3.5 - 5.1 mmol/L Final   Sodium  Date Value Ref Range Status  03/25/2023 140 135 - 145 mmol/L Final  09/28/2013 133 (L) 136 - 145 mmol/L Final   B Natriuretic Peptide  Date Value Ref Range Status  03/23/2023 554.0 (H) 0.0 - 100.0 pg/mL Final    Comment:    Performed at [facility], [address] [place], [place] [address]   Magnesium  Date Value Ref Range Status  03/25/2023 2.3 1.7 - 2.4 mg/dL Final    Comment:    Performed at [facility], [address] [place], [place] [address]   Hgb A1c MFr Bld  Date Value Ref Range Status  07/15/2022 7.3 (H) 4.8  - 5.6 % Final    Comment:    (NOTE)         Prediabetes: 5.7 - 6.4         Diabetes: >6.4         Glycemic control for adults with diabetes: <7.0    LDH  Date Value Ref Range Status  12/08/2022 109 98 - 192 U/L Final    Comment:    Performed at [facility], [address] [place], [place] [address]    Vital Signs: Admission weight: none Temp:  [97.7 F (36.5 C)-97.9 F (36.6 C)] 97.7 F (36.5 C) (12/04 0732) Pulse Rate:  [57-69] 58 (12/04 0732) Cardiac Rhythm: Sinus bradycardia;Normal sinus rhythm (12/03 2034) Resp:  [16-20] 20 (12/04 0732) BP: (125-151)/(49-70) 125/57 (12/04 0732) SpO2:  [90 %-98 %] 94 % (12/04 0732) Weight:  [79 kg (174 lb 2.6 oz)] 79 kg (174 lb 2.6 oz) (12/04 0432)  Intake/Output Summary (Last 24 hours) at 03/25/2023 0749 Last data filed at 03/24/2023 1900 Gross per 24 hour  Intake 240 ml  Output --  Net 240 ml    Current Heart Failure Medications:  Loop diuretic: furosemide 40 mg IV BID Beta-Blocker: carvedilol 12.5 mg BID ACEI/ARB/ARNI: none MRA: none SGLT2i: Jardiance 10 mg daily   Prior to admission Heart Failure Medications:  Loop diuretic: torsemide 20 mg daily (recent start) Beta-Blocker: carvedilol 12.5 mg BID ACEI/ARB/ARNI: none MRA: none SGLT2i: none Other vasoactive  medications include amlodipine 5 mg daily   Assessment: 1. Acute diastolic heart failure (LVEF 60-65%) with grade II diastolic dysfunction, due to unknown etiology. NYHA class II-III symptoms.  -Symptoms: Reports shortness of breath has improved but is not at baseline. LEE has also improved, but still present. Appetite is poor. -Volume: Urine output not charted. Creatinine is beginning to bump. Agree with holding diuresis today. -Hemodynamics: BP has been significantly elevated since admission, ranging from 140-160s/60s. Would benefit from afterload reduction, but options are limited with renal function. -BB: Beta blockers do not have significant benefit in  HFpEF and can worsen exercise capacity. Additionally, it can exacerbate COPD, though no exacerbation to COPD noted thus far thus admission. Can consider decreasing dose with plans to stop or changing to a beta-1-selective BB. -ACEI/ARB/ARNI: Creatinine is close to baseline today. Would benefit from the addition of an ARB for BP and CKD when creatinine stabilizes. -MRA: Given lability of renal function and A1c of 6.7 most recently, may be a better candidate for finerenone outpatient. -SGLT2i: Jardiance 10 mg daily added. Admission this year for pyelonephritis, education provided on hygiene and ADE avoidance.  1) Medication changes recommended at this time: -Agree with holding diuretics today. Jardiance added, which will also provide some diuresis.  2) Patient assistance: -Farxiga $95, Jardiance $45  3) Education: - Patient has been educated on current HF medications and potential additions to HF medication regimen - Patient verbalizes understanding that over the next few months, these medication doses may change and more medications may be added to optimize HF regimen - Patient has been educated on basic disease state pathophysiology and goals of therapy  Medication Assistance / Insurance Benefits Check: Does the patient have prescription insurance?    Type of insurance plan:  Does the patient qualify for medication assistance through manufacturers or grants? Pending   Outpatient Pharmacy: Prior to admission outpatient pharmacy: [facility]     Please do not hesitate to reach out with questions or concerns,  Enos Fling, PharmD, CPP, BCPS Heart Failure Pharmacist  Phone - [phone] 03/25/2023 7:49 AM

## 2023-03-26 ENCOUNTER — Other Ambulatory Visit: Payer: Self-pay

## 2023-03-26 ENCOUNTER — Encounter
Admission: EM | Disposition: A | Discharge status: Home Health | Payer: Self-pay | Source: Home / Self Care | Attending: Student in an Organized Health Care Education/Training Program

## 2023-03-26 DIAGNOSIS — I2489 Other forms of acute ischemic heart disease: Secondary | ICD-10-CM | POA: Diagnosis present

## 2023-03-26 DIAGNOSIS — I272 Pulmonary hypertension, unspecified: Secondary | ICD-10-CM | POA: Diagnosis not present

## 2023-03-26 DIAGNOSIS — I2510 Atherosclerotic heart disease of native coronary artery without angina pectoris: Secondary | ICD-10-CM | POA: Diagnosis not present

## 2023-03-26 DIAGNOSIS — I5033 Acute on chronic diastolic (congestive) heart failure: Secondary | ICD-10-CM | POA: Diagnosis not present

## 2023-03-26 DIAGNOSIS — J9621 Acute and chronic respiratory failure with hypoxia: Secondary | ICD-10-CM | POA: Diagnosis not present

## 2023-03-26 HISTORY — PX: CORONARY STENT INTERVENTION: CATH118234

## 2023-03-26 HISTORY — PX: RIGHT/LEFT HEART CATH AND CORONARY ANGIOGRAPHY: CATH118266

## 2023-03-26 LAB — POCT I-STAT EG7
Acid-Base Excess: 9 mmol/L — ABNORMAL HIGH (ref 0.0–2.0)
Acid-Base Excess: 8 mmol/L — ABNORMAL HIGH (ref 0.0–2.0)
Bicarbonate: 34.9 mmol/L — ABNORMAL HIGH (ref 20.0–28.0)
Bicarbonate: 34.8 mmol/L — ABNORMAL HIGH (ref 20.0–28.0)
Calcium, Ion: 1.22 mmol/L (ref 1.15–1.40)
Calcium, Ion: 1.22 mmol/L (ref 1.15–1.40)
HCT: 27 % — ABNORMAL LOW (ref 36.0–46.0)
HCT: 27 % — ABNORMAL LOW (ref 36.0–46.0)
Hemoglobin: 9.2 g/dL — ABNORMAL LOW (ref 12.0–15.0)
Hemoglobin: 9.2 g/dL — ABNORMAL LOW (ref 12.0–15.0)
O2 Saturation: 63 %
O2 Saturation: 59 %
Potassium: 3.7 mmol/L (ref 3.5–5.1)
Potassium: 3.8 mmol/L (ref 3.5–5.1)
Sodium: 140 mmol/L (ref 135–145)
Sodium: 140 mmol/L (ref 135–145)
TCO2: 37 mmol/L — ABNORMAL HIGH (ref 22–32)
TCO2: 37 mmol/L — ABNORMAL HIGH (ref 22–32)
pCO2, Ven: 57.4 mm[Hg] (ref 44–60)
pCO2, Ven: 58.5 mm[Hg] (ref 44–60)
pH, Ven: 7.392 (ref 7.25–7.43)
pH, Ven: 7.383 (ref 7.25–7.43)
pO2, Ven: 34 mm[Hg] (ref 32–45)
pO2, Ven: 32 mm[Hg] (ref 32–45)

## 2023-03-26 LAB — POCT I-STAT 7, (LYTES, BLD GAS, ICA,H+H)
Acid-Base Excess: 8 mmol/L — ABNORMAL HIGH (ref 0.0–2.0)
Bicarbonate: 33 mmol/L — ABNORMAL HIGH (ref 20.0–28.0)
Calcium, Ion: 1.21 mmol/L (ref 1.15–1.40)
HCT: 27 % — ABNORMAL LOW (ref 36.0–46.0)
Hemoglobin: 9.2 g/dL — ABNORMAL LOW (ref 12.0–15.0)
O2 Saturation: 95 %
Potassium: 3.7 mmol/L (ref 3.5–5.1)
Sodium: 140 mmol/L (ref 135–145)
TCO2: 34 mmol/L — ABNORMAL HIGH (ref 22–32)
pCO2 arterial: 49.2 mm[Hg] — ABNORMAL HIGH (ref 32–48)
pH, Arterial: 7.434 (ref 7.35–7.45)
pO2, Arterial: 75 mm[Hg] — ABNORMAL LOW (ref 83–108)

## 2023-03-26 LAB — BASIC METABOLIC PANEL
Anion gap: 10 (ref 5–15)
BUN: 34 mg/dL — ABNORMAL HIGH (ref 8–23)
CO2: 32 mmol/L (ref 22–32)
Calcium: 8.9 mg/dL (ref 8.9–10.3)
Chloride: 98 mmol/L (ref 98–111)
Creatinine, Ser: 1.89 mg/dL — ABNORMAL HIGH (ref 0.44–1.00)
GFR, Estimated: 28 mL/min — ABNORMAL LOW (ref >60)
Glucose, Bld: 85 mg/dL (ref 70–99)
Potassium: 3.7 mmol/L (ref 3.5–5.1)
Sodium: 140 mmol/L (ref 135–145)

## 2023-03-26 LAB — POCT ACTIVATED CLOTTING TIME
Activated Clotting Time: 239 s
Activated Clotting Time: 268 s

## 2023-03-26 LAB — GLUCOSE, CAPILLARY: Glucose-Capillary: 89 mg/dL (ref 70–99)

## 2023-03-26 SURGERY — RIGHT/LEFT HEART CATH AND CORONARY ANGIOGRAPHY
Anesthesia: Moderate Sedation

## 2023-03-26 MED ORDER — ASPIRIN 81 MG PO CHEW: 81.0000 mg | CHEWABLE_TABLET | ORAL | Status: DC

## 2023-03-26 MED ORDER — CLOPIDOGREL BISULFATE 75 MG PO TABS
ORAL_TABLET | ORAL | Status: AC
  Filled 2023-03-26: qty 8

## 2023-03-26 MED ORDER — LIDOCAINE HCL 1 % IJ SOLN
INTRAMUSCULAR | Status: DC | PRN
  Administered 2023-03-26: 3 mL
  Administered 2023-03-26: 2 mL

## 2023-03-26 MED ORDER — IOHEXOL 300 MG/ML  SOLN
INTRAMUSCULAR | Status: DC | PRN
  Administered 2023-03-26: 58 mL

## 2023-03-26 MED ORDER — HEPARIN (PORCINE) IN NACL 1000-0.9 UT/500ML-% IV SOLN
INTRAVENOUS | Status: AC
  Filled 2023-03-26: qty 1000

## 2023-03-26 MED ORDER — HEPARIN (PORCINE) IN NACL 1000-0.9 UT/500ML-% IV SOLN
INTRAVENOUS | Status: DC | PRN
  Administered 2023-03-26: 1000 mL

## 2023-03-26 MED ORDER — HEPARIN SODIUM (PORCINE) 1000 UNIT/ML IJ SOLN
INTRAMUSCULAR | Status: AC
  Filled 2023-03-26: qty 10

## 2023-03-26 MED ORDER — MIDAZOLAM HCL 2 MG/2ML IJ SOLN
INTRAMUSCULAR | Status: DC | PRN
  Administered 2023-03-26: .5 mg via INTRAVENOUS

## 2023-03-26 MED ORDER — VERAPAMIL HCL 2.5 MG/ML IV SOLN
INTRAVENOUS | Status: AC
  Filled 2023-03-26: qty 2

## 2023-03-26 MED ORDER — MIDAZOLAM HCL 2 MG/2ML IJ SOLN
INTRAMUSCULAR | Status: AC
Start: 2023-03-26 — End: ?
  Filled 2023-03-26: qty 2

## 2023-03-26 MED ORDER — CLOPIDOGREL BISULFATE 75 MG PO TABS
75.0000 mg | ORAL_TABLET | Freq: Every day | ORAL | Status: DC
  Administered 2023-03-27 – 2023-03-28 (×2): 75 mg via ORAL
  Filled 2023-03-26 (×2): qty 1

## 2023-03-26 MED ORDER — SODIUM CHLORIDE 0.9% FLUSH
3.0000 mL | Freq: Two times a day (BID) | INTRAVENOUS | Status: DC
  Administered 2023-03-27 – 2023-03-28 (×2): 3 mL via INTRAVENOUS

## 2023-03-26 MED ORDER — HEPARIN SODIUM (PORCINE) 1000 UNIT/ML IJ SOLN
INTRAMUSCULAR | Status: AC
Start: 2023-03-26 — End: ?
  Filled 2023-03-26: qty 10

## 2023-03-26 MED ORDER — SODIUM CHLORIDE 0.9 % IV SOLN: INTRAVENOUS | Status: AC

## 2023-03-26 MED ORDER — HYDRALAZINE HCL 20 MG/ML IJ SOLN: 10.0000 mg | INTRAMUSCULAR | Status: AC | PRN

## 2023-03-26 MED ORDER — LABETALOL HCL 5 MG/ML IV SOLN: 10.0000 mg | INTRAVENOUS | Status: AC | PRN

## 2023-03-26 MED ORDER — ALUM & MAG HYDROXIDE-SIMETH 200-200-20 MG/5ML PO SUSP
30.0000 mL | ORAL | Status: DC | PRN
  Administered 2023-03-26: 30 mL via ORAL
  Filled 2023-03-26: qty 30

## 2023-03-26 MED ORDER — SODIUM CHLORIDE 0.9% FLUSH: 3.0000 mL | INTRAVENOUS | Status: DC | PRN

## 2023-03-26 MED ORDER — HEPARIN SODIUM (PORCINE) 1000 UNIT/ML IJ SOLN
INTRAMUSCULAR | Status: DC | PRN
  Administered 2023-03-26: 3000 [IU] via INTRAVENOUS
  Administered 2023-03-26 (×2): 4000 [IU] via INTRAVENOUS
  Administered 2023-03-26: 1000 [IU] via INTRAVENOUS

## 2023-03-26 MED ORDER — ASPIRIN 81 MG PO CHEW
81.0000 mg | CHEWABLE_TABLET | ORAL | Status: AC
  Administered 2023-03-26: 81 mg via ORAL
  Filled 2023-03-26: qty 1

## 2023-03-26 MED ORDER — SODIUM CHLORIDE 0.9 % IV SOLN: 250.0000 mL | INTRAVENOUS | Status: DC | PRN

## 2023-03-26 MED ORDER — FENTANYL CITRATE (PF) 100 MCG/2ML IJ SOLN
INTRAMUSCULAR | Status: AC
  Filled 2023-03-26: qty 2

## 2023-03-26 MED ORDER — CLOPIDOGREL BISULFATE 75 MG PO TABS
ORAL_TABLET | ORAL | Status: DC | PRN
  Administered 2023-03-26: 600 mg via ORAL

## 2023-03-26 MED ORDER — SODIUM CHLORIDE 0.9 % IV SOLN: INTRAVENOUS | Status: DC

## 2023-03-26 MED ORDER — VERAPAMIL HCL 2.5 MG/ML IV SOLN
INTRAVENOUS | Status: DC | PRN
  Administered 2023-03-26: 2.5 mg via INTRA_ARTERIAL

## 2023-03-26 SURGICAL SUPPLY — 21 items
BALLN TREK RX 2.5X15 (BALLOONS) ×1
BALLOON TREK RX 2.5X15 (BALLOONS) IMPLANT
CATH BALLN WEDGE 5F 110CM (CATHETERS) IMPLANT
CATH INFINITI AMBI 5FR TG (CATHETERS) IMPLANT
CATH VISTA GUIDE 6FR JR4 (CATHETERS) IMPLANT
DEVICE RAD TR BAND REGULAR (VASCULAR PRODUCTS) IMPLANT
DRAPE BRACHIAL (DRAPES) IMPLANT
GLIDESHEATH SLEND SS 6F .021 (SHEATH) IMPLANT
GUIDEWIRE .025 260CM (WIRE) IMPLANT
GUIDEWIRE INQWIRE 1.5J.035X260 (WIRE) IMPLANT
INQWIRE 1.5J .035X260CM (WIRE) ×1
KIT ENCORE 26 ADVANTAGE (KITS) IMPLANT
KIT SYRINGE INJ CVI SPIKEX1 (MISCELLANEOUS) IMPLANT
PACK CARDIAC CATH (CUSTOM PROCEDURE TRAY) ×1 IMPLANT
PROTECTION STATION PRESSURIZED (MISCELLANEOUS) ×1
SET ATX-X65L (MISCELLANEOUS) IMPLANT
SHEATH GLIDE SLENDER 4/5FR (SHEATH) IMPLANT
STATION PROTECTION PRESSURIZED (MISCELLANEOUS) IMPLANT
STENT ONYX FRONTIER 3.0X22 (Permanent Stent) IMPLANT
TUBING CIL FLEX 10 FLL-RA (TUBING) IMPLANT
WIRE ASAHI PROWATER 180CM (WIRE) IMPLANT

## 2023-03-26 NOTE — Progress Notes (Signed)
   Patient Name: Misty Adams Date of Encounter: 03/26/2023 [facility] Cardiologist: Lorine Bears, MD   Interval Summary  .    Breathing slowly getting better.  No CP.  S/p PCI to RCA today.  Vital Signs .    Vitals:   03/26/23 1530 03/26/23 1600 03/26/23 1635 03/26/23 2012  BP: (!) 103/40 (!) 141/50 (!) 139/50 121/75  Pulse: (!) 53 60 60 (!) 59  Resp: 15 20 18 20   Temp:   98.2 F (36.8 C) 98.5 F (36.9 C)  TempSrc:   Oral Oral  SpO2: 94% 96% 94% 95%  Weight:        Intake/Output Summary (Last 24 hours) at 03/26/2023 2127 Last data filed at 03/26/2023 2034 Gross per 24 hour  Intake 87.33 ml  Output 400 ml  Net -312.67 ml      03/26/2023    5:00 AM 03/25/2023    4:32 AM 03/23/2023    8:02 AM  Last 3 Weights  Weight (lbs) 173 lb 11.6 oz 174 lb 2.6 oz 179 lb 12.8 oz  Weight (kg) 78.8 kg 79 kg 81.557 kg      Telemetry/ECG    Sinus rhythm - Personally Reviewed  Physical Exam .   GEN: No acute distress.   Neck: No JVD Cardiac: RRR, no murmurs, rubs, or gallops.  Respiratory: Coarse breath sounds w/bibasilar crackles GI: Soft, nontender, non-distended  MS: No edema  Assessment & Plan .     Acute on chronic HFpEF: Dyspnea/hypoxia slowly improving.  R/LHC today showed mildly elevated left heart filling pressures and mild-moderate PH.  Fick CO/CI normal.  Gentle post-catheterization hydration recommended following cath in the setting of renal insufficiency.  If creatinine is stable tomorrow, consider resumption of gentle diuresis.  Continue empagliflozin.  Acute on chronic respiratory failure with hypoxia: O2 requirement slowly returning to baseline.  Suspect exacerbation is a combination of HFpEF and COPD.  Hold diuresis today but consider resumption of gentle diuresis tomorrow based on renal function.  Ongoing management of COPD for IM.  Wean O2 back to baseline 2L as tolerated.  CAD: Cath today with severe RCA disease s/p PCI.  Plan to continue ASA  and clopidogrel for at least 12 months, along with atorvastatin and bisoprolol.  Cardiac rehab referral placed, though chronic lung disease may preclude participation.  Will need to readdress when patient is back to baseline respiratory status.  Acute kidney injury superimposed on chronic kidney disease: Creatine up slightly today.  Diuresis currently on hold with gentle post-catheterization hydration ordered by Dr. Herbie Baltimore.  Will recheck renal function tomorrow AM and determine if gentle diuresis can be resumed.  Avoid nephrotoxic agents.  For questions or updates, please contact [facility] Please consult [email or web address] for contact info under [facility] Cardiology.     Signed, Yvonne Kendall, MD

## 2023-03-26 NOTE — Progress Notes (Signed)
Progress Note   Patient: Misty Adams LOV:[id number] DOB: 16-Jul-1955 DOA: 03/23/2023     3 DOS: the patient was seen and examined on 03/26/2023   Brief hospital course: Misty Adams is a 67 y.o. female with medical history significant of chronic HFpEF, COPD, chronic hypoxic respiratory failure on nocturnal O2 2L at baseline, HTN, CKD stage IIIb, chronic iron deficiency anemia, HLD, hypothyroidism, PVD. They presented with worsening of shortness of breath, leg swelling.  She also had a significant weight gain.  Upon arriving the hospital, she developed hypoxemia, was placed on 5 L oxygen.  She also has a moderate elevation BNP, she was placed on IV Lasix.  Cardiology consult obtained.  She has shown gradual improvement with diuretic care and has weaned to 3Lnc. As she continues to have good UOP, would predict stable for dc in a day or two. Cards completed cath 12/5. Encourage to be out of bed.   Principal Problem:   CHF (congestive heart failure) (HCC) Active Problems:   Acute respiratory failure with hypoxia (HCC)   Acute on chronic heart failure with preserved ejection fraction (HFpEF) (HCC)   Chronic obstructive pulmonary disease (HCC)   Essential hypertension   Hyperlipidemia, unspecified   GERD without esophagitis   Hypokalemia   Acute on chronic respiratory failure with hypoxemia (HCC)   Chronic kidney disease, stage 3b (HCC)   Pulmonary hypertension, unspecified (HCC)  Assessment and Plan: Acute on chronic hypoxemic respiratory failure secondary to CHF exacerbation. Acute on chronic diastolic congestive heart failure. Patient has a classical presentation of exacerbation congestive heart failure with orthopnea, weight gain, leg edema.  She also had elevated BNP, chest x-ray showed evidence of pulmonary edema with pleural effusion. Recent echocardiogram performed on 03/08/2023 showed ejection fraction 60 to 65% with grade 2 diastolic dysfunction. Cardiology consult obtained,  appreciate help.  - holding lasix  - changing to bisoprolol  - left and right heart cath 12/5  - continue sglt2i  Essential hypertension. Continues on home medicines.  PAD  CAD- denies chest pain. Cath today. Results pending. - continue ASA, statin  Hypokalemia- s/p replacement - monitor while on diuretics  Chronic kidney disease stage IIIb. Renal function still stable, continue to follow. - bmp am  COPD  current tobacco user - inhalers PRN and scheduled - nicotine replacement and counseling  Hypothyroidism- - continue home therapy  Depression  anxiety  insomnia- - continue home therapy  Subjective:  Feeling improved overall. No complaints.  Physical Exam: Vitals:   03/25/23 2030 03/25/23 2336 03/26/23 0447 03/26/23 0500  BP: (!) 143/63 (!) 146/57 (!) 141/60   Pulse: 67 (!) 55 65   Resp: 20 19 20    Temp: 98.1 F (36.7 C) 97.7 F (36.5 C) 97.9 F (36.6 C)   TempSrc:  Oral Oral   SpO2: 94% 93% 98%   Weight:    78.8 kg   General exam: Appears calm and comfortable  Respiratory system: Decreased breath sounds with crackles in the bases. Respiratory effort normal. Cardiovascular system: S1 & S2 heard, RRR. No JVD, murmurs, rubs, gallops or clicks.  Gastrointestinal system: Abdomen is nondistended, soft and nontender. No organomegaly or masses felt. Normal bowel sounds heard. Central nervous system: Alert and oriented. No focal neurological deficits. Extremities: 1+ leg edema Skin: No rashes, lesions or ulcers Psychiatry: Judgement and insight appear normal. Mood & affect appropriate.    Data Reviewed:  Reviewed prior echo results, chest results, lab results.  Family Communication: None  Disposition: Status is:  Inpatient Remains inpatient appropriate because: Severity of disease, IV treatment.     Time spent: 35 minutes  Author: Leeroy Bock, MD 03/26/2023 8:08 AM  For on call review [email or web address].

## 2023-03-26 NOTE — Plan of Care (Signed)

## 2023-03-26 NOTE — Progress Notes (Signed)
While carefully removing tape over PIV on right wrist area, the tape pulled small amount of skin.  Carefully wiped clean and notified the Cath Lab team prior to procedure.

## 2023-03-26 NOTE — Plan of Care (Signed)

## 2023-03-26 NOTE — Interval H&P Note (Addendum)
History and Physical Interval Note:  03/26/2023 10:51 AM  Misty Adams  has presented today for surgery, with the diagnosis of congestive heart failure.  The various methods of treatment have been discussed with the patient and family. After consideration of risks, benefits and other options for treatment, the patient has consented to  Procedure(s): RIGHT/LEFT HEART CATH AND CORONARY ANGIOGRAPHY (N/A)  PERCUTANEOUS CORONARY INTERVENTION  as a surgical intervention.  The patient's history has been reviewed, patient examined, no change in status, stable for surgery.  I have reviewed the patient's chart and labs.  Questions were answered to the patient's satisfaction.    Cath Lab Visit (complete for each Cath Lab visit)  Clinical Evaluation Leading to the Procedure:   ACS: Yes.   - Acute on Chronic Combined CHF  Non-ACS:    Anginal Classification: CCS III  Anti-ischemic medical therapy: Minimal Therapy (1 class of medications)  Non-Invasive Test Results: No non-invasive testing performed  Prior CABG: No previous CABG     Bryan Lemma

## 2023-03-27 ENCOUNTER — Encounter: Payer: Self-pay | Admitting: Cardiology

## 2023-03-27 DIAGNOSIS — J432 Centrilobular emphysema: Secondary | ICD-10-CM

## 2023-03-27 DIAGNOSIS — Z955 Presence of coronary angioplasty implant and graft: Secondary | ICD-10-CM

## 2023-03-27 DIAGNOSIS — I272 Pulmonary hypertension, unspecified: Secondary | ICD-10-CM | POA: Diagnosis not present

## 2023-03-27 DIAGNOSIS — J9601 Acute respiratory failure with hypoxia: Secondary | ICD-10-CM | POA: Diagnosis not present

## 2023-03-27 DIAGNOSIS — I2510 Atherosclerotic heart disease of native coronary artery without angina pectoris: Secondary | ICD-10-CM

## 2023-03-27 DIAGNOSIS — I509 Heart failure, unspecified: Secondary | ICD-10-CM | POA: Diagnosis not present

## 2023-03-27 DIAGNOSIS — I5033 Acute on chronic diastolic (congestive) heart failure: Secondary | ICD-10-CM | POA: Diagnosis not present

## 2023-03-27 DIAGNOSIS — E782 Mixed hyperlipidemia: Secondary | ICD-10-CM

## 2023-03-27 DIAGNOSIS — J9621 Acute and chronic respiratory failure with hypoxia: Secondary | ICD-10-CM | POA: Diagnosis not present

## 2023-03-27 LAB — CBC
HCT: 27.7 % — ABNORMAL LOW (ref 36.0–46.0)
Hemoglobin: 8.1 g/dL — ABNORMAL LOW (ref 12.0–15.0)
MCH: 25.2 pg — ABNORMAL LOW (ref 26.0–34.0)
MCHC: 29.2 g/dL — ABNORMAL LOW (ref 30.0–36.0)
MCV: 86 fL (ref 80.0–100.0)
Platelets: 216 10*3/uL (ref 150–400)
RBC: 3.22 MIL/uL — ABNORMAL LOW (ref 3.87–5.11)
RDW: 21.8 % — ABNORMAL HIGH (ref 11.5–15.5)
WBC: 5.4 10*3/uL (ref 4.0–10.5)
nRBC: 0 % (ref 0.0–0.2)

## 2023-03-27 LAB — BASIC METABOLIC PANEL
Anion gap: 6 (ref 5–15)
BUN: 27 mg/dL — ABNORMAL HIGH (ref 8–23)
CO2: 32 mmol/L (ref 22–32)
Calcium: 8.7 mg/dL — ABNORMAL LOW (ref 8.9–10.3)
Chloride: 104 mmol/L (ref 98–111)
Creatinine, Ser: 1.6 mg/dL — ABNORMAL HIGH (ref 0.44–1.00)
GFR, Estimated: 35 mL/min — ABNORMAL LOW (ref >60)
Glucose, Bld: 89 mg/dL (ref 70–99)
Potassium: 3.8 mmol/L (ref 3.5–5.1)
Sodium: 142 mmol/L (ref 135–145)

## 2023-03-27 MED ORDER — FUROSEMIDE 10 MG/ML IJ SOLN
40.0000 mg | Freq: Two times a day (BID) | INTRAMUSCULAR | Status: DC
  Administered 2023-03-27 – 2023-03-28 (×3): 40 mg via INTRAVENOUS
  Filled 2023-03-27 (×3): qty 4

## 2023-03-27 MED ORDER — SPIRONOLACTONE 25 MG PO TABS
25.0000 mg | ORAL_TABLET | Freq: Every day | ORAL | Status: DC
  Administered 2023-03-28: 25 mg via ORAL
  Filled 2023-03-27: qty 1

## 2023-03-27 MED ORDER — AMLODIPINE BESYLATE 5 MG PO TABS: 5.0000 mg | ORAL_TABLET | Freq: Every day | ORAL | Status: DC

## 2023-03-27 MED ORDER — SPIRONOLACTONE 12.5 MG HALF TABLET
12.5000 mg | ORAL_TABLET | Freq: Once | ORAL | Status: AC
  Administered 2023-03-27: 12.5 mg via ORAL
  Filled 2023-03-27: qty 1

## 2023-03-27 MED ORDER — SPIRONOLACTONE 12.5 MG HALF TABLET
12.5000 mg | ORAL_TABLET | Freq: Every day | ORAL | Status: DC
  Administered 2023-03-27: 12.5 mg via ORAL
  Filled 2023-03-27: qty 1

## 2023-03-27 MED ORDER — EZETIMIBE 10 MG PO TABS
10.0000 mg | ORAL_TABLET | Freq: Every day | ORAL | Status: DC
  Administered 2023-03-27 – 2023-03-28 (×2): 10 mg via ORAL
  Filled 2023-03-27 (×2): qty 1

## 2023-03-27 MED ORDER — ENOXAPARIN SODIUM 40 MG/0.4ML IJ SOSY
40.0000 mg | PREFILLED_SYRINGE | INTRAMUSCULAR | Status: DC
  Administered 2023-03-27: 40 mg via SUBCUTANEOUS
  Filled 2023-03-27: qty 0.4

## 2023-03-27 NOTE — Plan of Care (Signed)
Alert and oriented. Medication regimen adjusted, educated on plan of care for medication changes.  Medicated with PRN medication for pain. See MAR.   Problem: Education: Goal: Knowledge of General Education information will improve Description: Including pain rating scale, medication(s)/side effects and non-pharmacologic comfort measures Outcome: Progressing   Problem: Health Behavior/Discharge Planning: Goal: Ability to manage health-related needs will improve Outcome: Progressing   Problem: Clinical Measurements: Goal: Ability to maintain clinical measurements within normal limits will improve Outcome: Progressing Goal: Will remain free from infection Outcome: Progressing Goal: Diagnostic test results will improve Outcome: Progressing   Problem: Elimination: Goal: Will not experience complications related to bowel motility Outcome: Progressing Goal: Will not experience complications related to urinary retention Outcome: Progressing   Problem: Pain Management: Goal: General experience of comfort will improve Outcome: Progressing

## 2023-03-27 NOTE — Progress Notes (Signed)
Heart Failure Stewardship Pharmacy Note  PCP: Danella Penton, MD PCP-Cardiologist: Lorine Bears, MD  HPI: Misty Adams is a 67 y.o. female with COPD, diastolic CHF, hypertension, dyslipidemia, hypothyroidism and peripheral vascular disease with left subclavian stent placement who presented with worsening dyspnea, orthopnea, and lower extremity edema . BNP was elevated to 554, down from 710.1 during recent admission in November of this year. Echo in 02/2023 showed LVEF of 60-65%, grade II diastolic dysfunction, mild AS.  HS-troponin was 8 on admission. CXR this admission noted interstitial pulmonary edema with small right pleural effusion. After recent admission in November, the patient was taking oral furosemide and was found to be volume up at her PCP appointment. Torsemide was started, but patient reports that this did not significantly increase diuresis. Underwent cath on 03/26/23 where a 90% stenosis of mid RCA was identified as the culprit lesion and treated with DES. RHC showed PCWP of 24 and PAP of 49/24 (33), CO/CI of 5.76/3.13, RAP of 14.  Pertinent Lab Values: Creatinine  Date Value Ref Range Status  01/21/2023 1.38 (H) 0.44 - 1.00 mg/dL Final  78/29/5621 3.08 0.60 - 1.30 mg/dL Final   Creatinine, Ser  Date Value Ref Range Status  03/27/2023 1.60 (H) 0.44 - 1.00 mg/dL Final   BUN  Date Value Ref Range Status  03/27/2023 27 (H) 8 - 23 mg/dL Final  65/78/4696 15 7 - 18 mg/dL Final   Potassium  Date Value Ref Range Status  03/27/2023 3.8 3.5 - 5.1 mmol/L Final  09/28/2013 3.9 3.5 - 5.1 mmol/L Final   Sodium  Date Value Ref Range Status  03/27/2023 142 135 - 145 mmol/L Final  09/28/2013 133 (L) 136 - 145 mmol/L Final   B Natriuretic Peptide  Date Value Ref Range Status  03/23/2023 554.0 (H) 0.0 - 100.0 pg/mL Final    Comment:    Performed at [facility], [address] [place], [place] [address]   Magnesium  Date Value Ref Range Status  03/25/2023 2.3  1.7 - 2.4 mg/dL Final    Comment:    Performed at [facility], [address] [place], [place] [address]   Hgb A1c MFr Bld  Date Value Ref Range Status  07/15/2022 7.3 (H) 4.8 - 5.6 % Final    Comment:    (NOTE)         Prediabetes: 5.7 - 6.4         Diabetes: >6.4         Glycemic control for adults with diabetes: <7.0    LDH  Date Value Ref Range Status  12/08/2022 109 98 - 192 U/L Final    Comment:    Performed at [facility], [address] [place], [place] [address]    Vital Signs: Admission weight: none Temp:  [97.8 F (36.6 C)-98.5 F (36.9 C)] 98.4 F (36.9 C) (12/06 0417) Pulse Rate:  [52-71] 60 (12/06 0417) Cardiac Rhythm: Sinus bradycardia (12/06 0700) Resp:  [12-22] 19 (12/06 0417) BP: (96-168)/(40-91) 121/66 (12/06 0417) SpO2:  [86 %-98 %] 95 % (12/06 0417) Weight:  [78.7 kg (173 lb 6.4 oz)] 78.7 kg (173 lb 6.4 oz) (12/06 0500)  Intake/Output Summary (Last 24 hours) at 03/27/2023 0747 Last data filed at 03/26/2023 2034 Gross per 24 hour  Intake 87.33 ml  Output 400 ml  Net -312.67 ml    Current Heart Failure Medications:  Loop diuretic: furosemide 40 mg IV BID Beta-Blocker: carvedilol 12.5 mg BID ACEI/ARB/ARNI: none MRA:  none SGLT2i: Jardiance 10 mg daily   Prior to admission Heart Failure Medications:  Loop diuretic: torsemide 20 mg daily (recent start) Beta-Blocker: carvedilol 12.5 mg BID ACEI/ARB/ARNI: none MRA: none SGLT2i: none Other vasoactive medications include amlodipine 5 mg daily   Assessment: 1. Acute diastolic heart failure (LVEF 60-65%) with grade II diastolic dysfunction, due to unknown etiology. NYHA class II-III symptoms.  -Volume: Noted to still be hypervolemic on cath with PCWP and RA pressures both up. Renal function is improved this AM, agree with restarting diuretics. May need increased dose if not responding. -Hemodynamics: BP has been better controlled this morning.  -BB: Changed to a beta-1 selective  beta blocker for CAD with COPD. Rate in 60s, continue current dose. -ACEI/ARB/ARNI: BP better controlled today and spironolactone is being started. Will monitor response before considering ARB. -MRA: Spironolactone 12.5 mg daily started today. Will monitor renal function and potassium. -SGLT2i: Jardiance 10 mg daily added. Admission this year for pyelonephritis, education provided on hygiene and ADE mitigation.  1) Medication changes recommended at this time: -None  2) Patient assistance: -Farxiga $95, Jardiance $45  3) Education: - Patient has been educated on current HF medications and potential additions to HF medication regimen - Patient verbalizes understanding that over the next few months, these medication doses may change and more medications may be added to optimize HF regimen - Patient has been educated on basic disease state pathophysiology and goals of therapy  Medication Assistance / Insurance Benefits Check: Does the patient have prescription insurance?    Type of insurance plan:  Does the patient qualify for medication assistance through manufacturers or grants? Pending   Outpatient Pharmacy: Prior to admission [facility]     Please do not hesitate to reach out with questions or concerns,  Enos Fling, PharmD, CPP, BCPS Heart Failure Pharmacist  Phone - [phone] 03/27/2023 7:47 AM

## 2023-03-27 NOTE — Progress Notes (Signed)
Misty Schwartz NP notified about a single bedbug that was found in the patients bed. Patient placed on contact and an isolation caddy was applied to the patients door.

## 2023-03-27 NOTE — Progress Notes (Signed)
Physical Therapy Evaluation Patient Details Name: Misty Adams MRN: [id number] DOB: 28-Apr-1955 Today's Date: 03/27/2023  History of Present Illness  Pt is 67 y/o admitted 03/23/23 for acute on chronic heart failure with preserved ejection fraction. S/sx associated include worsening of SOB as well as LE swelling. PMHx: COPD, HTN, HLD, CKD stageIIIB, and chronic hypoxic respiratory failure on nocturnal O2 2L.   Clinical Impression  Pt received in bed on 3L of O2 [place] and agreed to PT session. Pt performed bed mobility ModI, STS without the use of AD SUP and amb ~200 CGA prior to ending session in recliner due to fatigue. Pt remained on 3L of O2 throughout session with vitals reading 89-95% SpO2. Pt uses O2 at baseline, however would only use 2L at night, no O2 during the day. Pt tolerated Tx well and will continue to benefit from skilled PT sessions to improve endurance, functional mobility, and activity tolerance to maximize safety/IND following D/C.        If plan is discharge home, recommend the following: A little help with walking and/or transfers;Assist for transportation;Help with stairs or ramp for entrance   Can travel by private vehicle        Equipment Recommendations None recommended by PT  Recommendations for Other Services       Functional Status Assessment Patient has had a recent decline in their functional status and demonstrates the ability to make significant improvements in function in a reasonable and predictable amount of time.     Precautions / Restrictions Precautions Precautions: Fall Restrictions Weight Bearing Restrictions: No      Mobility  Bed Mobility Overal bed mobility: Needs Assistance Bed Mobility: Supine to Sit     Supine to sit: Modified independent (Device/Increase time)     General bed mobility comments: Pt performed bed mobility ModI and did not report any s/sx relative to dizziness.    Transfers Overall transfer level: Needs  assistance Equipment used: None Transfers: Sit to/from Stand Sit to Stand: Supervision           General transfer comment: Pt performed STS without the use of AD SUP and did not report any s/sx relative to dizziness.    Ambulation/Gait Ambulation/Gait assistance: Contact guard assist Gait Distance (Feet): 200 Feet Assistive device: None Gait Pattern/deviations: Step-through pattern Gait velocity: WNL     General Gait Details: Pt amb without the use of an AD and maintained appropriate vitals while on 3L of O2  Stairs            Wheelchair Mobility     Tilt Bed    Modified Rankin (Stroke Patients Only)       Balance Overall balance assessment: Needs assistance Sitting-balance support: Feet supported Sitting balance-Leahy Scale: Good     Standing balance support: No upper extremity supported Standing balance-Leahy Scale: Good                               Pertinent Vitals/Pain Pain Assessment Pain Assessment: No/denies pain    Home Living Family/patient expects to be discharged to:: Private residence Living Arrangements: Children Available Help at Discharge: Family Type of Home: House Home Access: Level entry       Home Layout: Two level;Able to live on main level with bedroom/bathroom Home Equipment: Shower seat;Grab bars - tub/shower;Rolling Walker (2 wheels)      Prior Function Prior Level of Function : Independent/Modified Independent  Mobility Comments: Pt reports IND prior to admission. ADLs Comments: Pt reports IND prior to admission     Extremity/Trunk Assessment   Upper Extremity Assessment Upper Extremity Assessment: Overall WFL for tasks assessed    Lower Extremity Assessment Lower Extremity Assessment: Generalized weakness       Communication   Communication Communication: No apparent difficulties Cueing Techniques: Verbal cues  Cognition Arousal: Alert Behavior During Therapy: WFL for tasks  assessed/performed Overall Cognitive Status: Within Functional Limits for tasks assessed                                 General Comments: AOx4. Pt pleasant and willing to participate in PT session        General Comments      Exercises     Assessment/Plan    PT Assessment Patient needs continued PT services  PT Problem List Decreased activity tolerance;Decreased mobility       PT Treatment Interventions Gait training;Functional mobility training;Therapeutic activities;Therapeutic exercise    PT Goals (Current goals can be found in the Care Plan section)  Acute Rehab PT Goals Patient Stated Goal: To go home PT Goal Formulation: With patient Time For Goal Achievement: 04/10/23 Potential to Achieve Goals: Good    Frequency Min 1X/week     Co-evaluation               AM-PAC PT "6 Clicks" Mobility  Outcome Measure Help needed turning from your back to your side while in a flat bed without using bedrails?: None Help needed moving from lying on your back to sitting on the side of a flat bed without using bedrails?: None Help needed moving to and from a bed to a chair (including a wheelchair)?: A Little Help needed standing up from a chair using your arms (e.g., wheelchair or bedside chair)?: A Little Help needed to walk in hospital room?: A Little Help needed climbing 3-5 steps with a railing? : A Little 6 Click Score: 20    End of Session Equipment Utilized During Treatment: Gait belt;Oxygen Activity Tolerance: Patient tolerated treatment well Patient left: in chair;with call bell/phone within reach Nurse Communication: Mobility status PT Visit Diagnosis: Muscle weakness (generalized) (M62.81);Other abnormalities of gait and mobility (R26.89)    Time: 0160-1093 PT Time Calculation (min) (ACUTE ONLY): 17 min   Charges:   PT Evaluation $PT Eval Low Complexity: 1 Low PT Treatments $Gait Training: 8-22 mins PT General Charges $$ ACUTE PT  VISIT: 1 Visit         Misty Adams SPT, LAT, ATC  [name] 03/27/2023, 2:00 PM

## 2023-03-27 NOTE — Progress Notes (Signed)
Rounding Note    Patient Name: Misty Adams Date of Encounter: 03/27/2023  [facility] Cardiologist: Lorine Bears, MD   Subjective   Patient is overall doing well. No chest pain and breathing is better. Cath site is stable. She is still on supplemental 3L O2. Kidney function is better today  Inpatient Medications    Scheduled Meds:  aspirin EC  81 mg Oral QHS   atorvastatin  40 mg Oral QHS   bisoprolol  5 mg Oral Daily   budesonide (PULMICORT) nebulizer solution  0.25 mg Nebulization BID   clopidogrel  75 mg Oral Q breakfast   empagliflozin  10 mg Oral Daily   famotidine  40 mg Oral QHS   ferrous sulfate  325 mg Oral Q breakfast   gabapentin  200 mg Oral QHS   heparin  5,000 Units Subcutaneous Q12H   levothyroxine  125 mcg Oral Q0600   pantoprazole  40 mg Oral BID   sodium chloride flush  3 mL Intravenous Q12H   sodium chloride flush  3 mL Intravenous Q12H   umeclidinium-vilanterol  1 puff Inhalation Daily   venlafaxine XR  150 mg Oral Daily   Continuous Infusions:  sodium chloride     PRN Meds: sodium chloride, acetaminophen, albuterol, ALPRAZolam, alum & mag hydroxide-simeth, guaiFENesin-dextromethorphan, ondansetron, sodium chloride, sodium chloride flush, sodium chloride flush, traZODone   Vital Signs    Vitals:   03/27/23 0300 03/27/23 0417 03/27/23 0500 03/27/23 0912  BP:  121/66    Pulse:  60  61  Resp:  19    Temp:  98.4 F (36.9 C)  98 F (36.7 C)  TempSrc:  Oral  Oral  SpO2:  95%  96%  Weight:   78.7 kg   Height: 5\' 4"  (1.626 m)       Intake/Output Summary (Last 24 hours) at 03/27/2023 0914 Last data filed at 03/26/2023 2034 Gross per 24 hour  Intake 87.33 ml  Output 400 ml  Net -312.67 ml      03/27/2023    5:00 AM 03/26/2023    5:00 AM 03/25/2023    4:32 AM  Last 3 Weights  Weight (lbs) 173 lb 6.4 oz 173 lb 11.6 oz 174 lb 2.6 oz  Weight (kg) 78.654 kg 78.8 kg 79 kg      Telemetry    NSR HR 60s, brief run of SVT -  Personally Reviewed  ECG    No new - Personally Reviewed  Physical Exam   GEN: No acute distress.   Neck: No JVD Cardiac: RRR, no murmurs, rubs, or gallops.  Respiratory: Clear to auscultation bilaterally. GI: Soft, nontender, non-distended  MS: No edema; No deformity. Neuro:  Nonfocal  Psych: Normal affect   Labs    High Sensitivity Troponin:   Recent Labs  Lab 03/08/23 2325 03/09/23 0648 03/23/23 0905  TROPONINIHS 18* 38* 8     Chemistry Recent Labs  Lab 03/25/23 0506 03/26/23 0435 03/26/23 1109 03/26/23 1120 03/26/23 1121 03/27/23 0447  NA 140 140   < > 140 140 142  K 3.6 3.7   < > 3.8 3.7 3.8  CL 98 98  --   --   --  104  CO2 31 32  --   --   --  32  GLUCOSE 88 85  --   --   --  89  BUN 28* 34*  --   --   --  27*  CREATININE 1.69* 1.89*  --   --   --  1.60*  CALCIUM 8.7* 8.9  --   --   --  8.7*  MG 2.3  --   --   --   --   --   GFRNONAA 32* 28*  --   --   --  35*  ANIONGAP 11 10  --   --   --  6   < > = values in this interval not displayed.    Lipids No results for input(s): "CHOL", "TRIG", "HDL", "LABVLDL", "LDLCALC", "CHOLHDL" in the last 168 hours.  Hematology Recent Labs  Lab 03/23/23 0905 03/26/23 1109 03/26/23 1120 03/26/23 1121 03/27/23 0447  WBC 6.6  --   --   --  5.4  RBC 3.76*  --   --   --  3.22*  HGB 9.2*   < > 9.2* 9.2* 8.1*  HCT 32.8*   < > 27.0* 27.0* 27.7*  MCV 87.2  --   --   --  86.0  MCH 24.5*  --   --   --  25.2*  MCHC 28.0*  --   --   --  29.2*  RDW 21.2*  --   --   --  21.8*  PLT 254  --   --   --  216   < > = values in this interval not displayed.   Thyroid No results for input(s): "TSH", "FREET4" in the last 168 hours.  BNP Recent Labs  Lab 03/23/23 0905  BNP 554.0*    DDimer No results for input(s): "DDIMER" in the last 168 hours.   Radiology    CARDIAC CATHETERIZATION  Result Date: 03/26/2023   Prox RCA lesion is 45% stenosed.   CULPRIT LESION: Mid RCA lesion is 90% stenosed.   A drug-eluting stent was  successfully placed using a STENT ONYX FRONTIER 3.0X22.   Post intervention, there is a 0% residual stenosis.   -------------------------------------   Hemodynamic findings consistent with mild pulmonary hypertension.  (PAP 49/24-33 mmHg, PCWP 24 mmHg transpulmonary Gradient 9 mmHg); LVEDP 16 mmHg.   Compensated CHF: LVEDP 16 mmHg, PCWP 24 mmHg.   Anticipated discharge date to be determined.   Continue to hold diuretic today with gentle hydration post cath.  Pending renal function would restart oral Lasix prior to discharge Monitor renal function Continue to titrate GDMT for Chronic Diastolic Heart Failure, and treating underlying COPD   Recommend uninterrupted dual antiplatelet therapy with Aspirin 81mg  daily and Clopidogrel 75mg  daily for a minimum of 12 months (ACS-Class I recommendation). RECOMMENDATIONS   Anticipated discharge date to be determined.   Continue to hold diuretic today with gentle hydration post cath.  Pending renal function would restart oral Lasix prior to discharge Monitor renal function Continue to titrate GDMT for Chronic Diastolic Heart Failure, and treating underlying COPD   Recommend uninterrupted dual antiplatelet therapy with Aspirin 81mg  daily and Clopidogrel 75mg  daily for a minimum of 12 months (ACS-Class I recommendation). Bryan Lemma, MD   Cardiac Studies   R/L heart cath 03/26/23     Prox RCA lesion is 45% stenosed.   CULPRIT LESION: Mid RCA lesion is 90% stenosed.   A drug-eluting stent was successfully placed using a STENT ONYX FRONTIER 3.0X22.   Post intervention, there is a 0% residual stenosis.   -------------------------------------   Hemodynamic findings consistent with mild pulmonary hypertension.  (PAP 49/24-33 mmHg, PCWP 24 mmHg transpulmonary Gradient 9 mmHg); LVEDP 16 mmHg.   Compensated CHF: LVEDP 16 mmHg, PCWP 24 mmHg.   Anticipated discharge date  to be determined.   Continue to hold diuretic today with gentle hydration post cath.  Pending renal  function would restart oral Lasix prior to discharge Monitor renal function Continue to titrate GDMT for Chronic Diastolic Heart Failure, and treating underlying COPD   Recommend uninterrupted dual antiplatelet therapy with Aspirin 81mg  daily and Clopidogrel 75mg  daily for a minimum of 12 months (ACS-Class I recommendation).     RECOMMENDATIONS   Anticipated discharge date to be determined.   Continue to hold diuretic today with gentle hydration post cath.  Pending renal function would restart oral Lasix prior to discharge Monitor renal function Continue to titrate GDMT for Chronic Diastolic Heart Failure, and treating underlying COPD   Recommend uninterrupted dual antiplatelet therapy with Aspirin 81mg  daily and Clopidogrel 75mg  daily for a minimum of 12 months (ACS-Class I recommendation).       Bryan Lemma, MD  2D Echocardiogram 11.18.2024   1. Left ventricular ejection fraction, by estimation, is 60 to 65%. The  left ventricle has normal function. The left ventricle has no regional  wall motion abnormalities. Left ventricular diastolic parameters are  consistent with Grade II diastolic  dysfunction (pseudonormalization).   2. Right ventricular systolic function is normal. The right ventricular  size is normal. Tricuspid regurgitation signal is inadequate for assessing  PA pressure.   3. Left atrial size was mildly dilated.   4. The mitral valve is abnormal. No evidence of mitral valve  regurgitation. Mild mitral stenosis. The mean mitral valve gradient is 6.0  mmHg. Moderate mitral annular calcification.   5. The aortic valve is normal in structure. Aortic valve regurgitation is  not visualized. Mild aortic valve stenosis. Aortic valve mean gradient  measures 9.7 mmHg.   6. The inferior vena cava is normal in size with greater than 50%  respiratory variability, suggesting right atrial pressure of 3 mmHg.   Patient Profile     67 y.o. female with a history of HFpEF, HTN, HL,  subclavian stenosis, chronic venous insuff, COPD, ILD, GERD, hypothyroidism, CKD IIIb, and tob abuse, who was readmitted on December 2 with HFpEF.   Assessment & Plan    Acute on chronic HFpEF - R/L heart cath showed mildly elevated filling pressures and mild to mod PH - echo showed LVEF 60-65%, G2DD, normal RVSF, mild MR, moderate calcification, mild AS - lasix held 12/5 for worsening Scr and heart cath, but is better today - restart IV lasix 40mg  BID - breathing is improving - I/Os not complete? - continue to monitor I/Os, daily weights, and kidney function  Acute on chronic respiratory failure with hypoxia - multifactorial given HFpEF and COPD - still on 3L O2>wean as able - continue IV lasix  CAD - cath showed severe disease s/p PCI - DAPT with ASA and PLAvix x 12 months - continue Lipitor and bisoprolol - will need cardiac rehab as OP - cath site is stable  AKI on CKD  - Scr stable today at 1.60  For questions or updates, please contact [facility] Please consult [email or web address] for contact info under        Signed, Jawuan Robb David Stall, PA-C  03/27/2023, 9:14 AM

## 2023-03-27 NOTE — TOC Progression Note (Signed)
Transition of Care [facility]) - Progression Note    Patient Details  Name: Misty Adams MRN: [id number] Date of Birth: [birth year]  [facility] CM/SW Contact  Liliana Cline, LCSW Phone Number: 03/27/2023, 1:56 PM  Clinical Narrative:    CSW spoke with patient at bedside to discuss recommendation for Home Health. Patient is agreeable to HHPT only. She prefers [facility]. Referral made to [facility] with [facility] for HHPT services.   Expected Discharge Plan: Home/Self Care (CSW completed consult for SNF; patient refused) Barriers to Discharge: Continued Medical Work up  Expected Discharge Plan and Services In-house Referral: Clinical Social Work Discharge Planning Services:  (pending DC) Post Acute Care Choice: Home Health (possible home health patient refused SNF) Living arrangements for the past 2 months: Single Family Home                                       Social Determinants of Health (SDOH) Interventions SDOH Screenings   Food Insecurity: No Food Insecurity (03/24/2023)  Housing: Patient Declined (03/24/2023)  Transportation Needs: No Transportation Needs (03/24/2023)  Utilities: Not At Risk (03/24/2023)  Alcohol Screen: Low Risk  (03/24/2023)  Depression (PHQ2-9): Low Risk  (12/08/2022)  Financial Resource Strain: Low Risk  (03/24/2023)  Tobacco Use: High Risk (03/24/2023)    Readmission Risk Interventions    03/23/2023    3:59 PM  Readmission Risk Prevention Plan  Transportation Screening Complete  PCP or Specialist Appt within 3-5 Days Complete  HRI or Home Care Consult Complete  Social Work Consult for Recovery Care Planning/Counseling Complete  Palliative Care Screening Not Applicable  Medication Review Oceanographer) --

## 2023-03-27 NOTE — Plan of Care (Signed)

## 2023-03-27 NOTE — Progress Notes (Addendum)
Progress Note   Patient: Misty Adams:[id number] DOB: Mar 25, 1956 DOA: 03/23/2023     4 DOS: the patient was seen and examined on 03/27/2023   Brief hospital course: IOMA MENCER is a 67 y.o. female with medical history significant of chronic HFpEF, COPD, chronic hypoxic respiratory failure on nocturnal O2 2L at baseline, HTN, CKD stage IIIb, chronic iron deficiency anemia, HLD, hypothyroidism, PVD. They presented with worsening of shortness of breath, leg swelling.  She also had a significant weight gain.  Upon arriving the hospital, she developed hypoxemia, was placed on 5 L oxygen.  She also has a moderate elevation BNP, she was placed on IV Lasix.  Cardiology consult obtained.  She has shown gradual improvement with diuretic care and has weaned to 3Lnc. As she continues to have good UOP, would predict stable for dc in a day or two. Cards completed cath 12/5. Encourage to be out of bed.   Principal Problem:   Acute on chronic heart failure with preserved ejection fraction (HFpEF) (HCC) Active Problems:   Acute respiratory failure with hypoxia (HCC)   Chronic obstructive pulmonary disease (HCC)   Essential hypertension   Hyperlipidemia, unspecified   GERD without esophagitis   Hypokalemia   CHF (congestive heart failure) (HCC)   Acute on chronic respiratory failure with hypoxemia (HCC)   Chronic kidney disease, stage 3b (HCC)   Pulmonary hypertension, unspecified (HCC)   Demand ischemia (HCC)   CAD in native artery  Assessment and Plan: Acute on chronic hypoxemic respiratory failure secondary to CHF exacerbation. Acute on chronic diastolic congestive heart failure. Patient has a classical presentation of exacerbation congestive heart failure with orthopnea, weight gain, leg edema.  She also had elevated BNP, chest x-ray showed evidence of pulmonary edema with pleural effusion. CT shows signs consistent with pulmonary hypertension.  Recent echocardiogram performed on  03/08/2023 showed ejection fraction 60 to 65% with grade 2 diastolic dysfunction. Cardiology consult obtained, appreciate help.  - restarting lasix today  - changing to bisoprolol  - left and right heart cath 12/5, stent to RCA. DAPT for at least 12 months.   - continue sglt2i  Essential hypertension. Continues on home medicines. - increasing spironolactone today for better BP control as well as likely to decrease her K+ with renewing her lasix regimen today - discontinuing amlodipine as I don't want to contribute to her LE edema  PAD  CAD  HLD- denies chest pain. L and R heart cath 12/5 with stent placed to RCA. - continue ASA, plavix at least a year - continue statin, zetia  Hypokalemia- s/p replacement, resolved. - monitor while on diuretics  Chronic kidney disease stage IIIb. Renal function still stable with Cr 1.6 today, continue to follow. - bmp am  COPD  current tobacco user - inhalers PRN and scheduled - nicotine replacement and counseling  Hypothyroidism- - continue home therapy  Depression  anxiety  insomnia- - continue home therapy  Subjective:  Feeling improved overall. Denies chest pain or SOB at rest. Some tenderness at site of the cath insertion.  Physical Exam: Vitals:   03/27/23 0009 03/27/23 0300 03/27/23 0417 03/27/23 0500  BP: 111/62  121/66   Pulse: 63  60   Resp: 19  19   Temp: 98 F (36.7 C)  98.4 F (36.9 C)   TempSrc: Oral  Oral   SpO2: 95%  95%   Weight:    78.7 kg  Height:  5\' 4"  (1.626 m)     General exam:  Appears calm and comfortable  Respiratory system: Decreased breath sounds with crackles in the bases. Respiratory effort normal. Cardiovascular system: S1 & S2 heard, RRR. No JVD, murmurs, rubs, gallops or clicks.  Gastrointestinal system: Abdomen is nondistended, soft and nontender. No organomegaly or masses felt. Normal bowel sounds heard. Central nervous system: Alert and oriented. No focal neurological  deficits. Extremities: 1+ leg edema Skin: No rashes, lesions or ulcers Psychiatry: Judgement and insight appear normal. Mood & affect appropriate.    Data Reviewed:  Reviewed prior echo results, chest results, lab results.  Family Communication: None  Disposition: Status is: Inpatient Remains inpatient appropriate because: Severity of disease, IV treatment.     Time spent: 35 minutes  Author: Leeroy Bock, MD 03/27/2023 8:02 AM  For on call review [email or web address].

## 2023-03-28 DIAGNOSIS — N184 Chronic kidney disease, stage 4 (severe): Secondary | ICD-10-CM

## 2023-03-28 DIAGNOSIS — N179 Acute kidney failure, unspecified: Secondary | ICD-10-CM

## 2023-03-28 DIAGNOSIS — I5033 Acute on chronic diastolic (congestive) heart failure: Secondary | ICD-10-CM | POA: Diagnosis not present

## 2023-03-28 DIAGNOSIS — Z955 Presence of coronary angioplasty implant and graft: Secondary | ICD-10-CM

## 2023-03-28 DIAGNOSIS — J9621 Acute and chronic respiratory failure with hypoxia: Secondary | ICD-10-CM | POA: Diagnosis not present

## 2023-03-28 DIAGNOSIS — I2510 Atherosclerotic heart disease of native coronary artery without angina pectoris: Secondary | ICD-10-CM | POA: Diagnosis not present

## 2023-03-28 LAB — BASIC METABOLIC PANEL
Anion gap: 8 (ref 5–15)
BUN: 28 mg/dL — ABNORMAL HIGH (ref 8–23)
CO2: 34 mmol/L — ABNORMAL HIGH (ref 22–32)
Calcium: 9.1 mg/dL (ref 8.9–10.3)
Chloride: 100 mmol/L (ref 98–111)
Creatinine, Ser: 1.86 mg/dL — ABNORMAL HIGH (ref 0.44–1.00)
GFR, Estimated: 29 mL/min — ABNORMAL LOW (ref >60)
Glucose, Bld: 93 mg/dL (ref 70–99)
Potassium: 3.9 mmol/L (ref 3.5–5.1)
Sodium: 142 mmol/L (ref 135–145)

## 2023-03-28 LAB — LIPOPROTEIN A (LPA): Lipoprotein (a): 54.2 nmol/L — ABNORMAL HIGH (ref <75.0)

## 2023-03-28 MED ORDER — ALBUTEROL SULFATE HFA 108 (90 BASE) MCG/ACT IN AERS: 2.0000 | INHALATION_SPRAY | Freq: Four times a day (QID) | RESPIRATORY_TRACT | 2 refills | Status: DC | PRN

## 2023-03-28 MED ORDER — SPIRONOLACTONE 25 MG PO TABS: 25.0000 mg | ORAL_TABLET | Freq: Every day | ORAL | 1 refills | Status: DC

## 2023-03-28 MED ORDER — ALBUTEROL SULFATE HFA 108 (90 BASE) MCG/ACT IN AERS
2.0000 | INHALATION_SPRAY | Freq: Four times a day (QID) | RESPIRATORY_TRACT | 3 refills | Status: AC | PRN
Start: 2023-03-28 — End: ?

## 2023-03-28 MED ORDER — TORSEMIDE 40 MG PO TABS
40.0000 mg | ORAL_TABLET | Freq: Every day | ORAL | 1 refills | Status: DC
  Filled 2023-03-28: qty 30, fill #0

## 2023-03-28 MED ORDER — CLOPIDOGREL BISULFATE 75 MG PO TABS
75.0000 mg | ORAL_TABLET | Freq: Every day | ORAL | 1 refills | Status: DC
  Filled 2023-03-28: qty 30, 30d supply, fill #0

## 2023-03-28 MED ORDER — TORSEMIDE 40 MG PO TABS: 40.0000 mg | ORAL_TABLET | Freq: Every day | ORAL | 1 refills | Status: DC

## 2023-03-28 MED ORDER — FERROUS SULFATE 325 (65 FE) MG PO TABS: 325.0000 mg | ORAL_TABLET | Freq: Every day | ORAL | 3 refills | Status: DC

## 2023-03-28 MED ORDER — EMPAGLIFLOZIN 10 MG PO TABS
10.0000 mg | ORAL_TABLET | Freq: Every day | ORAL | 0 refills | Status: DC
  Filled 2023-03-28: qty 30, 30d supply, fill #0

## 2023-03-28 MED ORDER — TORSEMIDE 20 MG PO TABS
40.0000 mg | ORAL_TABLET | Freq: Every day | ORAL | Status: DC
  Administered 2023-03-28: 40 mg via ORAL
  Filled 2023-03-28: qty 2

## 2023-03-28 MED ORDER — SPIRONOLACTONE 25 MG PO TABS
25.0000 mg | ORAL_TABLET | Freq: Every day | ORAL | 1 refills | Status: DC
  Filled 2023-03-28: qty 30, 30d supply, fill #0

## 2023-03-28 MED ORDER — BISOPROLOL FUMARATE 5 MG PO TABS
5.0000 mg | ORAL_TABLET | Freq: Every day | ORAL | 1 refills | Status: DC
  Filled 2023-03-28: qty 30, 30d supply, fill #0

## 2023-03-28 MED ORDER — BISOPROLOL FUMARATE 5 MG PO TABS: 5.0000 mg | ORAL_TABLET | Freq: Every day | ORAL | 1 refills | Status: DC

## 2023-03-28 MED ORDER — EZETIMIBE 10 MG PO TABS: 10.0000 mg | ORAL_TABLET | Freq: Every day | ORAL | 1 refills | Status: DC

## 2023-03-28 MED ORDER — EZETIMIBE 10 MG PO TABS
10.0000 mg | ORAL_TABLET | Freq: Every day | ORAL | 1 refills | Status: DC
  Filled 2023-03-28: qty 30, 30d supply, fill #0

## 2023-03-28 MED ORDER — EMPAGLIFLOZIN 10 MG PO TABS: 10.0000 mg | ORAL_TABLET | Freq: Every day | ORAL | 1 refills | Status: DC

## 2023-03-28 MED ORDER — CLOPIDOGREL BISULFATE 75 MG PO TABS: 75.0000 mg | ORAL_TABLET | Freq: Every day | ORAL | 3 refills | Status: DC

## 2023-03-28 MED ORDER — FERROUS SULFATE 325 (65 FE) MG PO TABS
325.0000 mg | ORAL_TABLET | Freq: Every day | ORAL | 3 refills | Status: DC
  Filled 2023-03-28: qty 30, 30d supply, fill #0

## 2023-03-28 NOTE — Discharge Summary (Signed)
Physician Discharge Summary   Patient: Misty Adams MRN: [id number] DOB: [birth year]  Admit date:     03/23/2023  Discharge date: 03/28/23  Discharge Physician: Enedina Finner   PCP: Danella Penton, MD   Recommendations at discharge:   follow-up [facility] cardiology in 1 to 2 week follow-up PCP in 1 to 2 weeks  Discharge Diagnoses: Principal Problem:   Acute on chronic heart failure with preserved ejection fraction (HFpEF) (HCC) Active Problems:   Acute respiratory failure with hypoxia (HCC)   Chronic obstructive pulmonary disease (HCC)   Essential hypertension   Hyperlipidemia, unspecified   GERD without esophagitis   Hypokalemia   CHF (congestive heart failure) (HCC)   Acute on chronic respiratory failure with hypoxemia (HCC)   Chronic kidney disease, stage 3b (HCC)   Pulmonary hypertension, unspecified (HCC)   Demand ischemia (HCC)   CAD in native artery  Misty Adams is a 67 y.o. female with medical history significant of chronic HFpEF, COPD, chronic hypoxic respiratory failure on nocturnal O2 2L at baseline, HTN, CKD stage IIIb, chronic iron deficiency anemia, HLD, hypothyroidism, PVD. Patient presented with worsening of shortness of breath, leg swelling and significant weight gain.  Upon arriving the hospital, she developed hypoxemia, was placed on 5 L oxygen.  She also has a moderate elevation BNP, she was placed on IV Lasix. [facility]  Cardiology consult obtained.   Acute on chronic hypoxemic respiratory failure secondary to CHF exacerbation--NEW Acute on chronic diastolic congestive heart failure. --Patient has a classical presentation of exacerbation congestive heart failure with orthopnea, weight gain, leg edema.  She also had elevated BNP, chest x-ray showed evidence of pulmonary edema with pleural effusion.  --CT shows signs consistent with pulmonary hypertension.  --Recent echocardiogram performed on 03/08/2023 showed ejection fraction 60 to 65% with grade 2 diastolic  dysfunction. --[facility] Cardiology consult obtained, appreciate help.             -Patient was on IV Lasix now switch to PO torsemide today             - changing to bisoprolol             - continue sglt2i --- patient is back to two later nasal count oxygen. Okay from cardiology standpoint for discharge. Discussed with Dr. Cristal Deer.   Essential hypertension. -- Continue present cardiac meds  PAD  CAD  HLD --L and R heart cath 12/5 with stent placed to RCA. - continue ASA, plavix at least a year - continue statin, zetia   Hypokalemia- s/p replacement, resolved. - monitor while on diuretics   Chronic kidney disease stage IIIb --Renal function still stable with Cr 1.6 --1.8   COPD  current tobacco user - inhalers PRN and scheduled - nicotine replacement and counseling -- continue chronic home oxygen  Hypothyroidism -on statins +zetia   Depression  anxiety  insomnia - continue home therapy  Overall patient feels well. She is eager to go home. Okay from cardiology standpoint for discharge. She'll follow-up with them as outpatient no family at bedside      Consultants: [facility] cardiology Procedures performed: cardiac catheterization Disposition: Home Diet recommendation:  Discharge Diet Orders (From admission, onward)     Start     Ordered   03/28/23 0000  Diet - low sodium heart healthy        03/28/23 1258           Cardiac diet DISCHARGE MEDICATION: Allergies as of 03/28/2023  Reactions   Hydrocodone-acetaminophen Hives   Vicodin [hydrocodone-acetaminophen] Hives   Trelegy Ellipta [fluticasone-umeclidin-vilant] Rash   Changed to Budeson-Glycopyrrol-Formoterol by provider        Medication List     STOP taking these medications    amLODipine 5 MG tablet Commonly known as: NORVASC   carvedilol 12.5 MG tablet Commonly known as: COREG   famotidine 40 MG tablet Commonly known as: PEPCID   potassium chloride 10 MEQ tablet Commonly known  as: KLOR-CON   potassium chloride SA 20 MEQ tablet Commonly known as: KLOR-CON M       TAKE these medications    acetaminophen 500 MG tablet Commonly known as: TYLENOL Take 1,000 mg by mouth every 6 (six) hours as needed for moderate pain or headache.   albuterol 108 (90 Base) MCG/ACT inhaler Commonly known as: VENTOLIN HFA Inhale 2 puffs into the lungs every 6 (six) hours as needed for wheezing or shortness of breath.   ALPRAZolam 0.5 MG tablet Commonly known as: XANAX Take 0.5 mg by mouth 2 (two) times daily as needed for anxiety.   aspirin 81 MG tablet Take 81 mg by mouth at bedtime.   atorvastatin 40 MG tablet Commonly known as: Lipitor Take 1 tablet (40 mg total) by mouth at bedtime.   bisoprolol 5 MG tablet Commonly known as: ZEBETA Take 1 tablet (5 mg total) by mouth daily. Start taking on: March 29, 2023   Breztri Aerosphere 160-9-4.8 MCG/ACT Aero Generic drug: Budeson-Glycopyrrol-Formoterol Inhale 2 puffs into the lungs 2 (two) times daily.   clopidogrel 75 MG tablet Commonly known as: PLAVIX Take 1 tablet (75 mg total) by mouth daily with breakfast. Start taking on: March 29, 2023   empagliflozin 10 MG Tabs tablet Commonly known as: JARDIANCE Take 1 tablet (10 mg total) by mouth daily. Start taking on: March 29, 2023   ezetimibe 10 MG tablet Commonly known as: ZETIA Take 1 tablet (10 mg total) by mouth daily. Start taking on: March 29, 2023   ferrous sulfate 325 (65 FE) MG tablet Take 1 tablet (325 mg total) by mouth daily with breakfast. Start taking on: March 29, 2023   gabapentin 100 MG capsule Commonly known as: NEURONTIN Take 200 mg by mouth at bedtime.   guaiFENesin-dextromethorphan 100-10 MG/5ML syrup Commonly known as: ROBITUSSIN DM Take 10 mLs by mouth every 4 (four) hours as needed for cough.   levothyroxine 125 MCG tablet Commonly known as: SYNTHROID Take 125 mcg by mouth daily.   ondansetron 4 MG tablet Commonly  known as: ZOFRAN Take 4 mg by mouth every 4 (four) hours as needed for nausea or vomiting.   OXYGEN Inhale 2 L/L into the lungs at bedtime.   pantoprazole 40 MG tablet Commonly known as: PROTONIX Take 40 mg by mouth 2 (two) times daily.   spironolactone 25 MG tablet Commonly known as: ALDACTONE Take 1 tablet (25 mg total) by mouth daily. Start taking on: March 29, 2023   temazepam 15 MG capsule Commonly known as: RESTORIL Take 15 mg by mouth at bedtime as needed for sleep.   Torsemide 40 MG Tabs Take 40 mg by mouth daily. What changed:  medication strength how much to take   traZODone 100 MG tablet Commonly known as: DESYREL Take 100 mg by mouth daily as needed for sleep.   venlafaxine XR 75 MG 24 hr capsule Commonly known as: EFFEXOR-XR Take 150 mg by mouth daily.        Follow-up Information  [facility]. Go in 6 day(s).   Specialty: Cardiology Why: Hospital Follow-Up Appointment 04/01/23 @ 1:30pm Please bring all medications to appointment [facility], [address] Second Floor Free Valet Parking Contact information: [address] [place] [address] [phone]        End, Cristal Deer, MD. Schedule an appointment as soon as possible for a visit in 1 week(s).   Specialty: Cardiology Why: NSTEMI, chf Contact information: [address] [place] [address] [phone]                Discharge Exam: Ceasar Mons Weights   03/25/23 0432 03/26/23 0500 03/27/23 0500  Weight: 79 kg 78.8 kg 78.7 kg   Alert and oriented times three respiratory clear to auscultation cardiovascular both heart sounds normal neuro- grossly nonfocal  Condition at discharge: fair  The results of significant diagnostics from this hospitalization (including imaging, microbiology, ancillary and laboratory) are listed below for reference.   Imaging Studies: CARDIAC  CATHETERIZATION  Result Date: 03/26/2023   Prox RCA lesion is 45% stenosed.   CULPRIT LESION: Mid RCA lesion is 90% stenosed.   A drug-eluting stent was successfully placed using a STENT ONYX FRONTIER [id number].   Post intervention, there is a 0% residual stenosis.   -------------------------------------   Hemodynamic findings consistent with mild pulmonary hypertension.  (PAP 49/24-33 mmHg, PCWP 24 mmHg transpulmonary Gradient 9 mmHg); LVEDP 16 mmHg.   Compensated CHF: LVEDP 16 mmHg, PCWP 24 mmHg.   Anticipated discharge date to be determined.   Continue to hold diuretic today with gentle hydration post cath.  Pending renal function would restart oral Lasix prior to discharge Monitor renal function Continue to titrate GDMT for Chronic Diastolic Heart Failure, and treating underlying COPD   Recommend uninterrupted dual antiplatelet therapy with Aspirin 81mg  daily and Clopidogrel 75mg  daily for a minimum of 12 months (ACS-Class I recommendation). RECOMMENDATIONS   Anticipated discharge date to be determined.   Continue to hold diuretic today with gentle hydration post cath.  Pending renal function would restart oral Lasix prior to discharge Monitor renal function Continue to titrate GDMT for Chronic Diastolic Heart Failure, and treating underlying COPD   Recommend uninterrupted dual antiplatelet therapy with Aspirin 81mg  daily and Clopidogrel 75mg  daily for a minimum of 12 months (ACS-Class I recommendation). Bryan Lemma, MD  DG Chest 1 View  Result Date: 03/24/2023 CLINICAL DATA:  68 year old female with CHF.  Weakness. EXAM: CHEST  1 VIEW COMPARISON:  Chest radiographs 03/23/2023 and earlier. FINDINGS: Portable AP upright view at 0701 hours. Stable lung volumes and mediastinal contours. Small right pleural effusion persists. Pulmonary vascularity remains increased although mildly regressed from yesterday. No pneumothorax. No consolidation. Visualized tracheal air column is within normal limits. No acute  osseous abnormality identified. IMPRESSION: 1. Regressed but not resolved pulmonary edema. Stable small right pleural effusion. 2. No new cardiopulmonary abnormality. Electronically Signed   By: Odessa Fleming M.D.   On: 03/24/2023 08:28   DG Chest 2 View  Result Date: 03/23/2023 CLINICAL DATA:  Shortness of breath. EXAM: CHEST - 2 VIEW COMPARISON:  Chest radiograph dated March 08, 2023. FINDINGS: Stable cardiomegaly. The mediastinal contours are within normal limits. Aortic atherosclerosis. Vascular stent in place. Bilateral basilar predominant interstitial opacities. Small right pleural effusion with basilar atelectasis. No pneumothorax. No acute osseous abnormality. IMPRESSION: 1. Cardiomegaly with findings suggestive of interstitial pulmonary edema. 2. Small right pleural effusion. Electronically Signed   By: Hart Robinsons  M.D.   On: 03/23/2023 09:29   ECHOCARDIOGRAM COMPLETE  Result Date: 03/09/2023    ECHOCARDIOGRAM REPORT   Patient Name:   Misty Adams Date of Exam: 03/09/2023 Medical Rec #:  [id number]         Height:       64.0 in Accession #:    [id number]        Weight:       176.2 lb Date of Birth:  [birth year]          BSA:          1.854 m Patient Age:    67 years          BP:           173/68 mmHg Patient Gender: F                 HR:           62 bpm. Exam Location:  [facility] Procedure: 2D Echo, Cardiac Doppler and Color Doppler Indications:     CHF-acute diastolic I50.31  History:         Patient has no prior history of Echocardiogram examinations.                  CHF, COPD; Risk Factors:Hypertension. Tobacco use.  Sonographer:     Cristela Blue Referring Phys:  [id number] JAN A MANSY Diagnosing Phys: Lorine Bears MD IMPRESSIONS  1. Left ventricular ejection fraction, by estimation, is 60 to 65%. The left ventricle has normal function. The left ventricle has no regional wall motion abnormalities. Left ventricular diastolic parameters are consistent with Grade II diastolic dysfunction  (pseudonormalization).  2. Right ventricular systolic function is normal. The right ventricular size is normal. Tricuspid regurgitation signal is inadequate for assessing PA pressure.  3. Left atrial size was mildly dilated.  4. The mitral valve is abnormal. No evidence of mitral valve regurgitation. Mild mitral stenosis. The mean mitral valve gradient is 6.0 mmHg. Moderate mitral annular calcification.  5. The aortic valve is normal in structure. Aortic valve regurgitation is not visualized. Mild aortic valve stenosis. Aortic valve mean gradient measures 9.7 mmHg.  6. The inferior vena cava is normal in size with greater than 50% respiratory variability, suggesting right atrial pressure of 3 mmHg. FINDINGS  Left Ventricle: Left ventricular ejection fraction, by estimation, is 60 to 65%. The left ventricle has normal function. The left ventricle has no regional wall motion abnormalities. The left ventricular internal cavity size was normal in size. There is  no left ventricular hypertrophy. Left ventricular diastolic parameters are consistent with Grade II diastolic dysfunction (pseudonormalization). Right Ventricle: The right ventricular size is normal. No increase in right ventricular wall thickness. Right ventricular systolic function is normal. Tricuspid regurgitation signal is inadequate for assessing PA pressure. The tricuspid regurgitant velocity is 2.26 m/s, and with an assumed right atrial pressure of 3 mmHg, the estimated right ventricular systolic pressure is 23.4 mmHg. Left Atrium: Left atrial size was mildly dilated. Right Atrium: Right atrial size was normal in size. Pericardium: There is no evidence of pericardial effusion. Mitral Valve: The mitral valve is abnormal. Moderate mitral annular calcification. No evidence of mitral valve regurgitation. Mild mitral valve stenosis. MV peak gradient, 13.4 mmHg. The mean mitral valve gradient is 6.0 mmHg. Tricuspid Valve: The tricuspid valve is normal in  structure. Tricuspid valve regurgitation is not demonstrated. No evidence of tricuspid stenosis. Aortic Valve: The aortic valve is normal in structure. Aortic valve regurgitation is  not visualized. Mild aortic stenosis is present. Aortic valve mean gradient measures 9.7 mmHg. Aortic valve peak gradient measures 19.0 mmHg. Aortic valve area, by VTI measures 2.57 cm. Pulmonic Valve: The pulmonic valve was normal in structure. Pulmonic valve regurgitation is not visualized. No evidence of pulmonic stenosis. Aorta: The aortic root is normal in size and structure. Venous: The inferior vena cava is normal in size with greater than 50% respiratory variability, suggesting right atrial pressure of 3 mmHg. IAS/Shunts: No atrial level shunt detected by color flow Doppler.  LEFT VENTRICLE PLAX 2D LVIDd:         4.20 cm   Diastology LVIDs:         3.00 cm   LV e' medial:    6.20 cm/s LV PW:         1.10 cm   LV E/e' medial:  26.1 LV IVS:        1.00 cm   LV e' lateral:   8.27 cm/s LVOT diam:     2.00 cm   LV E/e' lateral: 19.6 LV SV:         107 LV SV Index:   58 LVOT Area:     3.14 cm  RIGHT VENTRICLE RV Basal diam:  2.60 cm RV Mid diam:    2.60 cm RV S prime:     16.20 cm/s TAPSE (M-mode): 2.2 cm LEFT ATRIUM             Index        RIGHT ATRIUM           Index LA diam:        3.80 cm 2.05 cm/m   RA Area:     11.80 cm LA Vol (A2C):   41.6 ml 22.44 ml/m  RA Volume:   23.10 ml  12.46 ml/m LA Vol (A4C):   50.4 ml 27.19 ml/m LA Biplane Vol: 47.4 ml 25.57 ml/m  AORTIC VALVE AV Area (Vmax):    2.06 cm AV Area (Vmean):   2.26 cm AV Area (VTI):     2.57 cm AV Vmax:           217.67 cm/s AV Vmean:          145.667 cm/s AV VTI:            0.418 m AV Peak Grad:      19.0 mmHg AV Mean Grad:      9.7 mmHg LVOT Vmax:         143.00 cm/s LVOT Vmean:        105.000 cm/s LVOT VTI:          0.342 m LVOT/AV VTI ratio: 0.82  AORTA Ao Root diam: 2.60 cm MITRAL VALVE                TRICUSPID VALVE MV Area (PHT): 2.76 cm     TR Peak  grad:   20.4 mmHg MV Area VTI:   1.89 cm     TR Vmax:        226.00 cm/s MV Peak grad:  13.4 mmHg MV Mean grad:  6.0 mmHg     SHUNTS MV Vmax:       1.83 m/s     Systemic VTI:  0.34 m MV Vmean:      111.0 cm/s   Systemic Diam: 2.00 cm MV Decel Time: 275 msec MV E velocity: 162.00 cm/s MV A velocity: 142.00 cm/s MV E/A ratio:  1.14 Lorine Bears MD  Electronically signed by Lorine Bears MD Signature Date/Time: 03/09/2023/2:18:53 PM    Final    DG Chest Port 1 View  Result Date: 03/08/2023 CLINICAL DATA:  Shortness of breath EXAM: PORTABLE CHEST 1 VIEW COMPARISON:  07/14/2022 FINDINGS: Cardiac shadow is prominent. Aortic calcifications are seen. Increasing vascular congestion is noted with mild interstitial edema. Basilar atelectatic changes are noted as well. No bony abnormality is seen. IMPRESSION: Changes of mild CHF. Electronically Signed   By: Alcide Clever M.D.   On: 03/08/2023 23:38    Microbiology: Results for orders placed or performed during the hospital encounter of 03/08/23  Resp panel by RT-PCR (RSV, Flu A&B, Covid) Anterior Nasal Swab     Status: None   Collection Time: 03/08/23 11:25 PM   Specimen: Anterior Nasal Swab  Result Value Ref Range Status   SARS Coronavirus 2 by RT PCR NEGATIVE NEGATIVE Final    Comment: (NOTE) SARS-CoV-2 target nucleic acids are NOT DETECTED.  The SARS-CoV-2 RNA is generally detectable in upper respiratory specimens during the acute phase of infection. The lowest concentration of SARS-CoV-2 viral copies this assay can detect is 138 copies/mL. A negative result does not preclude SARS-Cov-2 infection and should not be used as the sole basis for treatment or other patient management decisions. A negative result may occur with  improper specimen collection/handling, submission of specimen other than nasopharyngeal swab, presence of viral mutation(s) within the areas targeted by this assay, and inadequate number of viral copies(<138 copies/mL). A  negative result must be combined with clinical observations, patient history, and epidemiological information. The expected result is Negative.  Fact Sheet for Patients:  BloggerCourse.com  Fact Sheet for Healthcare Providers:  SeriousBroker.it  This test is no t yet approved or cleared by the [place] FDA and  has been authorized for detection and/or diagnosis of SARS-CoV-2 by FDA under an Emergency Use Authorization (EUA). This EUA will remain  in effect (meaning this test can be used) for the duration of the COVID-19 declaration under Section 564(b)(1) of the Act, 21 U.S.C.section 360bbb-3(b)(1), unless the authorization is terminated  or revoked sooner.       Influenza A by PCR NEGATIVE NEGATIVE Final   Influenza B by PCR NEGATIVE NEGATIVE Final    Comment: (NOTE) The Xpert Xpress SARS-CoV-2/FLU/RSV plus assay is intended as an aid in the diagnosis of influenza from Nasopharyngeal swab specimens and should not be used as a sole basis for treatment. Nasal washings and aspirates are unacceptable for Xpert Xpress SARS-CoV-2/FLU/RSV testing.  Fact Sheet for Patients: BloggerCourse.com  Fact Sheet for Healthcare Providers: SeriousBroker.it  This test is not yet approved or cleared by the [place] FDA and has been authorized for detection and/or diagnosis of SARS-CoV-2 by FDA under an Emergency Use Authorization (EUA). This EUA will remain in effect (meaning this test can be used) for the duration of the COVID-19 declaration under Section 564(b)(1) of the Act, 21 U.S.C. section 360bbb-3(b)(1), unless the authorization is terminated or revoked.     Resp Syncytial Virus by PCR NEGATIVE NEGATIVE Final    Comment: (NOTE) Fact Sheet for Patients: BloggerCourse.com  Fact Sheet for Healthcare  Providers: SeriousBroker.it  This test is not yet approved or cleared by the [place] FDA and has been authorized for detection and/or diagnosis of SARS-CoV-2 by FDA under an Emergency Use Authorization (EUA). This EUA will remain in effect (meaning this test can be used) for the duration of the COVID-19 declaration under Section 564(b)(1) of the Act, 21  U.S.C. section 360bbb-3(b)(1), unless the authorization is terminated or revoked.  Performed at [facility], [address] [place], [place] [address]     Labs: CBC: Recent Labs  Lab 03/23/23 [phone] 03/26/23 1109 03/26/23 1120 03/26/23 1121 03/27/23 0447  WBC 6.6  --   --   --  5.4  HGB 9.2* 9.2* 9.2* 9.2* 8.1*  HCT 32.8* 27.0* 27.0* 27.0* 27.7*  MCV 87.2  --   --   --  86.0  PLT 254  --   --   --  216   Basic Metabolic Panel: Recent Labs  Lab 03/24/23 0503 03/25/23 0506 03/26/23 0435 03/26/23 1109 03/26/23 1120 03/26/23 1121 03/27/23 0447 03/28/23 0642  NA 142 140 140 140 140 140 142 142  K 3.2* 3.6 3.7 3.7 3.8 3.7 3.8 3.9  CL 100 98 98  --   --   --  104 100  CO2 34* 31 32  --   --   --  32 34*  GLUCOSE 93 88 85  --   --   --  89 93  BUN 25* 28* 34*  --   --   --  27* 28*  CREATININE 1.53* 1.69* 1.89*  --   --   --  1.60* 1.86*  CALCIUM 8.7* 8.7* 8.9  --   --   --  8.7* 9.1  MG  --  2.3  --   --   --   --   --   --    CBG: Recent Labs  Lab 03/26/23 0919  GLUCAP 89    Discharge time spent: greater than 30 minutes.  Signed: Enedina Finner, MD [facility] 03/28/2023

## 2023-03-28 NOTE — Progress Notes (Signed)
Rounding Note    Patient Name: Misty Adams Date of Encounter: 03/28/2023   [facility] Cardiologist: Lorine Bears, MD   Subjective   IV lasix restarted and Scr/BUN up today. Possible discharge today.   Inpatient Medications    Scheduled Meds:  aspirin EC  81 mg Oral QHS   atorvastatin  40 mg Oral QHS   bisoprolol  5 mg Oral Daily   budesonide (PULMICORT) nebulizer solution  0.25 mg Nebulization BID   clopidogrel  75 mg Oral Q breakfast   empagliflozin  10 mg Oral Daily   enoxaparin (LOVENOX) injection  40 mg Subcutaneous Q24H   ezetimibe  10 mg Oral Daily   famotidine  40 mg Oral QHS   ferrous sulfate  325 mg Oral Q breakfast   furosemide  40 mg Intravenous BID   gabapentin  200 mg Oral QHS   levothyroxine  125 mcg Oral Q0600   pantoprazole  40 mg Oral BID   sodium chloride flush  3 mL Intravenous Q12H   sodium chloride flush  3 mL Intravenous Q12H   spironolactone  25 mg Oral Daily   umeclidinium-vilanterol  1 puff Inhalation Daily   venlafaxine XR  150 mg Oral Daily   Continuous Infusions:  PRN Meds: acetaminophen, albuterol, ALPRAZolam, alum & mag hydroxide-simeth, guaiFENesin-dextromethorphan, ondansetron, sodium chloride, sodium chloride flush, traZODone   Vital Signs    Vitals:   03/28/23 0403 03/28/23 0823 03/28/23 0829 03/28/23 1122  BP: 115/61  108/60 (!) 131/50  Pulse: (!) 48 (!) 50 (!) 58 (!) 53  Resp: 18 16 20 20   Temp: 97.7 F (36.5 C)  98.2 F (36.8 C) (!) 97.5 F (36.4 C)  TempSrc:      SpO2: 98% 93% 100% 99%  Weight:      Height:        Intake/Output Summary (Last 24 hours) at 03/28/2023 1153 Last data filed at 03/28/2023 1041 Gross per 24 hour  Intake 243 ml  Output 550 ml  Net -307 ml      03/27/2023    5:00 AM 03/26/2023    5:00 AM 03/25/2023    4:32 AM  Last 3 Weights  Weight (lbs) 173 lb 6.4 oz 173 lb 11.6 oz 174 lb 2.6 oz  Weight (kg) 78.654 kg 78.8 kg 79 kg      Telemetry    SB/SR HR 50, 40s overnight -  Personally Reviewed  ECG    No new - Personally Reviewed  Physical Exam   GEN: No acute distress.   Neck: No JVD Cardiac: RRR, no murmurs, rubs, or gallops.  Respiratory: Clear to auscultation bilaterally. GI: Soft, nontender, non-distended  MS: No edema; No deformity. Neuro:  Nonfocal  Psych: Normal affect   Labs    High Sensitivity Troponin:   Recent Labs  Lab 03/08/23 2325 03/09/23 0648 03/23/23 0905  TROPONINIHS 18* 38* 8     Chemistry Recent Labs  Lab 03/25/23 0506 03/26/23 0435 03/26/23 1109 03/26/23 1121 03/27/23 0447 03/28/23 0642  NA 140 140   < > 140 142 142  K 3.6 3.7   < > 3.7 3.8 3.9  CL 98 98  --   --  104 100  CO2 31 32  --   --  32 34*  GLUCOSE 88 85  --   --  89 93  BUN 28* 34*  --   --  27* 28*  CREATININE 1.69* 1.89*  --   --  1.60* 1.86*  CALCIUM 8.7* 8.9  --   --  8.7* 9.1  MG 2.3  --   --   --   --   --   GFRNONAA 32* 28*  --   --  35* 29*  ANIONGAP 11 10  --   --  6 8   < > = values in this interval not displayed.    Lipids No results for input(s): "CHOL", "TRIG", "HDL", "LABVLDL", "LDLCALC", "CHOLHDL" in the last 168 hours.  Hematology Recent Labs  Lab 03/23/23 0905 03/26/23 1109 03/26/23 1120 03/26/23 1121 03/27/23 0447  WBC 6.6  --   --   --  5.4  RBC 3.76*  --   --   --  3.22*  HGB 9.2*   < > 9.2* 9.2* 8.1*  HCT 32.8*   < > 27.0* 27.0* 27.7*  MCV 87.2  --   --   --  86.0  MCH 24.5*  --   --   --  25.2*  MCHC 28.0*  --   --   --  29.2*  RDW 21.2*  --   --   --  21.8*  PLT 254  --   --   --  216   < > = values in this interval not displayed.   Thyroid No results for input(s): "TSH", "FREET4" in the last 168 hours.  BNP Recent Labs  Lab 03/23/23 0905  BNP 554.0*    DDimer No results for input(s): "DDIMER" in the last 168 hours.   Radiology    No results found.  Cardiac Studies    R/L heart cath 03/26/23     Prox RCA lesion is 45% stenosed.   CULPRIT LESION: Mid RCA lesion is 90% stenosed.   A drug-eluting  stent was successfully placed using a STENT ONYX FRONTIER 3.0X22.   Post intervention, there is a 0% residual stenosis.   -------------------------------------   Hemodynamic findings consistent with mild pulmonary hypertension.  (PAP 49/24-33 mmHg, PCWP 24 mmHg transpulmonary Gradient 9 mmHg); LVEDP 16 mmHg.   Compensated CHF: LVEDP 16 mmHg, PCWP 24 mmHg.   Anticipated discharge date to be determined.   Continue to hold diuretic today with gentle hydration post cath.  Pending renal function would restart oral Lasix prior to discharge Monitor renal function Continue to titrate GDMT for Chronic Diastolic Heart Failure, and treating underlying COPD   Recommend uninterrupted dual antiplatelet therapy with Aspirin 81mg  daily and Clopidogrel 75mg  daily for a minimum of 12 months (ACS-Class I recommendation).     RECOMMENDATIONS   Anticipated discharge date to be determined.   Continue to hold diuretic today with gentle hydration post cath.  Pending renal function would restart oral Lasix prior to discharge Monitor renal function Continue to titrate GDMT for Chronic Diastolic Heart Failure, and treating underlying COPD   Recommend uninterrupted dual antiplatelet therapy with Aspirin 81mg  daily and Clopidogrel 75mg  daily for a minimum of 12 months (ACS-Class I recommendation).       Bryan Lemma, MD   2D Echocardiogram 11.18.2024   1. Left ventricular ejection fraction, by estimation, is 60 to 65%. The  left ventricle has normal function. The left ventricle has no regional  wall motion abnormalities. Left ventricular diastolic parameters are  consistent with Grade II diastolic  dysfunction (pseudonormalization).   2. Right ventricular systolic function is normal. The right ventricular  size is normal. Tricuspid regurgitation signal is inadequate for assessing  PA pressure.   3. Left atrial size was mildly dilated.  4. The mitral valve is abnormal. No evidence of mitral valve  regurgitation.  Mild mitral stenosis. The mean mitral valve gradient is 6.0  mmHg. Moderate mitral annular calcification.   5. The aortic valve is normal in structure. Aortic valve regurgitation is  not visualized. Mild aortic valve stenosis. Aortic valve mean gradient  measures 9.7 mmHg.   6. The inferior vena cava is normal in size with greater than 50%  respiratory variability, suggesting right atrial pressure of 3 mmHg.   Patient Profile     67 y.o. female with a history of HFpEF, HTN, HL, subclavian stenosis, chronic venous insuff, COPD, ILD, GERD, hypothyroidism, CKD IIIb, and tob abuse, who was readmitted on December 2 with HFpEF.   Assessment & Plan    Acute on chronic HFpEF - R/L heart cath showed mildly elevated filling pressures and mild to mod PH - echo showed LVEF 60-65%, G2DD, normal RVSF, mild MR, moderate calcification, mild AS - lasix held 12/5 for worsening Scr and heart cath, but is better today - IV lasix 40mg  BID was restarted - breathing is improving - I/Os not complete? - PTA torsemide 20mg  daily - Scr/Bun up, can hold IV . I will increase Torsemide 40mg  daily.  She will need close follow-up.   Acute on chronic respiratory failure with hypoxia - multifactorial given HFpEF and COPD - continue 3L O2. Says she was on O2 before she came in - transition to oral torsemide as above   CAD - cath showed severe disease s/p PCI - DAPT with ASA and PLAvix x 12 months - continue Lipitor and bisoprolol - will need cardiac rehab as OP - cath site is stable   AKI on CKD  - Scr/Bun up today   For questions or updates, please contact [facility] Please consult [email or web address] for contact info under        Signed, Durga Saldarriaga David Stall, PA-C  03/28/2023, 11:53 AM

## 2023-03-30 ENCOUNTER — Other Ambulatory Visit: Payer: Self-pay

## 2023-03-30 ENCOUNTER — Telehealth: Payer: Self-pay

## 2023-03-30 DIAGNOSIS — I509 Heart failure, unspecified: Secondary | ICD-10-CM

## 2023-03-30 NOTE — Consult Note (Signed)
[facility] Liaison Note  03/30/2023  Misty Adams Mar 16, 1956 [id number]  Location: RN Hospital Liaison screened the patient remotely at [facility].  Insurance: [facility] HMO   Misty Adams is a 67 y.o. female who is a Primary Care Patient of Danella Penton, MD- [facility]. The patient was screened for 30 day readmission hospitalization with noted high risk score for unplanned readmission risk with 1 IP in 6 months.  The patient was assessed for potential Care Management service needs for post hospital transition for care coordination. Review of patient's electronic medical record reveals patient was admitted with  Congestive Heart Failure. Pt d/c with [facility] and provider's office will complete the transition of care follow up.  Liaison unsuccessful with outreach attempt for VBCI services. Will make a referral due to recent readmission and risk for readmission and possible need for disease management services with CHF.    Plan: [facility] Liaison will continue to follow progress and disposition to asess for post hospital community care coordination/management needs.  Referral request for community care coordination: Will make a referral for nurse care coordinator due to readmission.   VBCI Care Management/Population Health does not replace or interfere with any arrangements made by the Inpatient Transition of Care team.   For questions contact:   Elliot Cousin, RN, [facility] Liaison [place]   [facility], Population Health Office Hours MTWF  8:00 am-6:00 pm Direct Dial: [phone] mobile [phone] [Office toll free line] Office Hours are M-F 8:30 - 5 pm [name]

## 2023-03-31 ENCOUNTER — Telehealth: Payer: Self-pay | Admitting: *Deleted

## 2023-03-31 NOTE — Progress Notes (Unsigned)
  Care Coordination  Outreach Note  03/31/2023 Name: OCTIVIA BAZER MRN: [id number] DOB: [birth year]   Care Coordination Outreach Attempts: An unsuccessful telephone outreach was attempted today to offer the patient information about available care coordination services.  Follow Up Plan:  Additional outreach attempts will be made to offer the patient care coordination information and services.   Encounter Outcome:  No Answer  Burman Nieves, CCMA Care Coordination Care Guide Direct Dial: [phone]

## 2023-04-01 ENCOUNTER — Encounter: Payer: Medicare HMO | Admitting: Family

## 2023-04-02 NOTE — Progress Notes (Signed)
  Care Coordination   Note   04/02/2023 Name: [name] MRN: [id number] DOB: 02-Sep-1955  [name] is a 67 y.o. year old female who sees Danella Penton, MD for primary care. I reached out to [name] by phone today to offer care coordination services.  Ms. Stingle was given information about Care Coordination services today including:   The Care Coordination services include support from the care team which includes your Nurse Coordinator, Clinical Social Worker, or Pharmacist.  The Care Coordination team is here to help remove barriers to the health concerns and goals most important to you. Care Coordination services are voluntary, and the patient may decline or stop services at any time by request to their care team member.   Care Coordination Consent Status: Patient agreed to services and verbal consent obtained.   Follow up plan:  Telephone appointment with care coordination team member scheduled for:  04/17/2023  Encounter Outcome:  Patient Scheduled from referral   Burman Nieves, [facility] Care Coordination Care Guide Direct Dial: [phone]

## 2023-04-03 NOTE — Progress Notes (Unsigned)
Advanced Heart Failure Clinic Note   Referring Physician: hospitalist  PCP: Danella Penton, MD Cardiologist: Lorine Bears, MD    HPI:  Misty Adams is a 67 y/o female with a history of COPD, HTN, hyperlipidemia, GERD, CKD, pulmonary HTN, CAD, iron deficiency anemia, hypothyroidism, PVD and chronic heart failure.   Admitted 03/08/23 due to waking up short of breath & orthopnea after returning from a cruise. BNP 710. CXR showed heart failure. Initially placed on bipap and weaned down to nasal cannula. IV lasix given. Hypokalemia corrected. Echocardiogram performed on 03/08/2023 showed ejection fraction 60 to 65% with grade 2 diastolic dysfunction. Symptoms improved.   Admitted 03/23/23 due to worsening of shortness of breath, leg swelling and significant weight gain. Developed hypoxia and placed on 5L nasal cannula. BNP elevated and given IV lasix. Cardiology consulted. Chest x-ray showed evidence of pulmonary edema with pleural effusion. CT shows signs consistent with pulmonary hypertension. Oxygen weaned down to 2L. L and R heart cath 12/5 with stent placed to RCA. Hypokalemia corrected.   Echo 03/09/23: EF 60-65% with Grade II DD, mild LAE, mild AS  RHC/ LHC 03/26/23:   Prox RCA lesion is 45% stenosed.   CULPRIT LESION: Mid RCA lesion is 90% stenosed.   A drug-eluting stent was successfully placed using a STENT ONYX FRONTIER 3.0X22.   Post intervention, there is a 0% residual stenosis.   -------------------------------------   Hemodynamic findings consistent with mild pulmonary hypertension.  (PAP 49/24-33 mmHg, PCWP 24 mmHg transpulmonary Gradient 9 mmHg); LVEDP 16 mmHg.   Compensated CHF: LVEDP 16 mmHg, PCWP 24 mmHg.   Anticipated discharge date to be determined.   Continue to hold diuretic today with gentle hydration post cath.  Pending renal function would restart oral Lasix prior to discharge Monitor renal function Continue to titrate GDMT for Chronic Diastolic Heart Failure, and  treating underlying COPD   Recommend uninterrupted dual antiplatelet therapy with Aspirin 81mg  daily and Clopidogrel 75mg  daily for a minimum of 12 months (ACS-Class I recommendation).   She presents today for her initial visit with a chief complaint of   Review of Systems: [y] = yes, [ ]  = no   General: Weight gain [ ] ; Weight loss [ ] ; Anorexia [ ] ; Fatigue [ ] ; Fever [ ] ; Chills [ ] ; Weakness [ ]   Cardiac: Chest pain/pressure [ ] ; Resting SOB [ ] ; Exertional SOB [ ] ; Orthopnea [ ] ; Pedal Edema [ ] ; Palpitations [ ] ; Syncope [ ] ; Presyncope [ ] ; Paroxysmal nocturnal dyspnea[ ]   Pulmonary: Cough [ ] ; Wheezing[ ] ; Hemoptysis[ ] ; Sputum [ ] ; Snoring [ ]   GI: Vomiting[ ] ; Dysphagia[ ] ; Melena[ ] ; Hematochezia [ ] ; Heartburn[ ] ; Abdominal pain [ ] ; Constipation [ ] ; Diarrhea [ ] ; BRBPR [ ]   GU: Hematuria[ ] ; Dysuria [ ] ; Nocturia[ ]   Vascular: Pain in legs with walking [ ] ; Pain in feet with lying flat [ ] ; Non-healing sores [ ] ; Stroke [ ] ; TIA [ ] ; Slurred speech [ ] ;  Neuro: Headaches[ ] ; Vertigo[ ] ; Seizures[ ] ; Paresthesias[ ] ;Blurred vision [ ] ; Diplopia [ ] ; Vision changes [ ]   Ortho/Skin: Arthritis [ ] ; Joint pain [ ] ; Muscle pain [ ] ; Joint swelling [ ] ; Back Pain [ ] ; Rash [ ]   Psych: Depression[ ] ; Anxiety[ ]   Heme: Bleeding problems [ ] ; Clotting disorders [ ] ; Anemia [ ]   Endocrine: Diabetes [ ] ; Thyroid dysfunction[ ]    Past Medical History:  Diagnosis Date   Allergy    Anxiety  Chronic heart failure with preserved ejection fraction (HFpEF) (HCC)    a. 02/2023 Echo: EF 60-65%, no rwma, GrII DD, nl RV size/fxn, mild LAE, mild Misty (mean grad ), mod MAC, mild AS (mean grad 9.68mmHg).   CKD (chronic kidney disease), stage III (HCC)    COPD (chronic obstructive pulmonary disease) (HCC)    Depression    Diabetes mellitus without complication (HCC)    Hyperlipidemia    Hypertension    Hypothyroidism    Interstitial lung disease (HCC)    PAD (peripheral artery disease)  (HCC)    a. 07/2017 L subclavian stenosis s/p stenting.   Psoriasis    Substance abuse (HCC)    Tobacco abuse     Current Outpatient Medications  Medication Sig Dispense Refill   acetaminophen (TYLENOL) 500 MG tablet Take 1,000 mg by mouth every 6 (six) hours as needed for moderate pain or headache.     albuterol (VENTOLIN HFA) 108 (90 Base) MCG/ACT inhaler Inhale 2 puffs into the lungs every 6 (six) hours as needed for wheezing or shortness of breath. 1 each 3   ALPRAZolam (XANAX) 0.5 MG tablet Take 0.5 mg by mouth 2 (two) times daily as needed for anxiety.      aspirin 81 MG tablet Take 81 mg by mouth at bedtime.      atorvastatin (LIPITOR) 40 MG tablet Take 1 tablet (40 mg total) by mouth at bedtime. 30 tablet 0   bisoprolol (ZEBETA) 5 MG tablet Take 1 tablet (5 mg total) by mouth daily. 30 tablet 1   BREZTRI AEROSPHERE 160-9-4.8 MCG/ACT AERO Inhale 2 puffs into the lungs 2 (two) times daily.     clopidogrel (PLAVIX) 75 MG tablet Take 1 tablet (75 mg total) by mouth daily with breakfast. 30 tablet 3   empagliflozin (JARDIANCE) 10 MG TABS tablet Take 1 tablet (10 mg total) by mouth daily. 30 tablet 1   ezetimibe (ZETIA) 10 MG tablet Take 1 tablet (10 mg total) by mouth daily. 30 tablet 1   ferrous sulfate 325 (65 FE) MG tablet Take 1 tablet (325 mg total) by mouth daily with breakfast. 30 tablet 3   gabapentin (NEURONTIN) 100 MG capsule Take 200 mg by mouth at bedtime.     guaiFENesin-dextromethorphan (ROBITUSSIN DM) 100-10 MG/5ML syrup Take 10 mLs by mouth every 4 (four) hours as needed for cough. 118 mL 0   levothyroxine (SYNTHROID, LEVOTHROID) 125 MCG tablet Take 125 mcg by mouth daily.      ondansetron (ZOFRAN) 4 MG tablet Take 4 mg by mouth every 4 (four) hours as needed for nausea or vomiting.     OXYGEN Inhale 2 L/L into the lungs at bedtime.     pantoprazole (PROTONIX) 40 MG tablet Take 40 mg by mouth 2 (two) times daily.     spironolactone (ALDACTONE) 25 MG tablet Take 1 tablet  (25 mg total) by mouth daily. 30 tablet 1   temazepam (RESTORIL) 15 MG capsule Take 15 mg by mouth at bedtime as needed for sleep.     Torsemide 40 MG TABS Take 40 mg by mouth daily. 30 tablet 1   traZODone (DESYREL) 100 MG tablet Take 100 mg by mouth daily as needed for sleep.     venlafaxine XR (EFFEXOR-XR) 75 MG 24 hr capsule Take 150 mg by mouth daily.     No current facility-administered medications for this visit.    Allergies  Allergen Reactions   Hydrocodone-Acetaminophen Hives   Vicodin [Hydrocodone-Acetaminophen] Hives   Trelegy  Ellipta [Fluticasone-Umeclidin-Vilant] Rash    Changed to Budeson-Glycopyrrol-Formoterol by provider      Social History   Socioeconomic History   Marital status: Divorced    Spouse name: Not on file   Number of children: 5   Years of education: Not on file   Highest education level: Associate degree: academic program  Occupational History   Occupation: Retired  Tobacco Use   Smoking status: Every Day    Current packs/day: 2.00    Average packs/day: 2.0 packs/day for 50.0 years (100.0 ttl pk-yrs)    Types: Cigarettes   Smokeless tobacco: Never  Vaping Use   Vaping status: Former   Substances: Nicotine  Substance and Sexual Activity   Alcohol use: Yes   Drug use: Not Currently   Sexual activity: Not Currently  Other Topics Concern   Not on file  Social History Narrative   Lives locally by herself.  Sedentary.   Social Drivers of Corporate investment banker Strain: Low Risk  (03/24/2023)   Overall Financial Resource Strain (CARDIA)    Difficulty of Paying Living Expenses: Not hard at all  Food Insecurity: No Food Insecurity (03/24/2023)   Hunger Vital Sign    Worried About Running Out of Food in the Last Year: Never true    Ran Out of Food in the Last Year: Never true  Transportation Needs: No Transportation Needs (03/24/2023)   PRAPARE - Administrator, Civil Service (Medical): No    Lack of Transportation  (Non-Medical): No  Physical Activity: Not on file  Stress: Not on file  Social Connections: Not on file  Intimate Partner Violence: Not At Risk (03/24/2023)   Humiliation, Afraid, Rape, and Kick questionnaire    Fear of Current or Ex-Partner: No    Emotionally Abused: No    Physically Abused: No    Sexually Abused: No      Family History  Problem Relation Age of Onset   Uterine cancer Mother    Stroke Mother    Aneurysm Mother    Heart attack Father    Coronary artery disease Father    Leukemia Father    Depression Father    Diabetes Father    Hyperlipidemia Father    Hypertension Father    Colon polyps Sister    Obesity Sister    Thyroid disease Sister    COPD Sister    Lung cancer Sister    Breast cancer Maternal Aunt    Breast cancer Maternal Aunt       PHYSICAL EXAM: General:  Well appearing. No respiratory difficulty HEENT: normal Neck: supple. no JVD. No lymphadenopathy or thyromegaly appreciated. Cor: PMI nondisplaced. Regular rate & rhythm. No rubs, gallops or murmurs. Lungs: clear Abdomen: soft, nontender, nondistended. No hepatosplenomegaly. No bruits or masses. . Extremities: no cyanosis, clubbing, rash, edema Neuro: alert & oriented x 3, cranial nerves grossly intact. moves all 4 extremities w/o difficulty. Affect pleasant.  ECG:   ASSESSMENT & PLAN:  1: Chronic heart failure with preserved ejection fraction- - suspect due to  - NYHA class - euvolemic - weighing daily - Echo 03/09/23: EF 60-65% with Grade II DD, mild LAE, mild AS - continue  - BNP  2: HTN- - BP - saw PCP - BMP  3: CAD- - RHC/ LHC 03/26/23:   Prox RCA lesion is 45% stenosed.   CULPRIT LESION: Mid RCA lesion is 90% stenosed.   A drug-eluting stent was successfully placed using a STENT ONYX FRONTIER 3.0X22.  Post intervention, there is a 0% residual stenosis.   -------------------------------------   Hemodynamic findings consistent with mild pulmonary hypertension.   (PAP 49/24-33 mmHg, PCWP 24 mmHg transpulmonary Gradient 9 mmHg); LVEDP 16 mmHg.   Compensated CHF: LVEDP 16 mmHg, PCWP 24 mmHg.   Anticipated discharge date to be determined.   Continue to hold diuretic today with gentle hydration post cath.  Pending renal function would restart oral Lasix prior to discharge Monitor renal function Continue to titrate GDMT for Chronic Diastolic Heart Failure, and treating underlying COPD   Recommend uninterrupted dual antiplatelet therapy with Aspirin 81mg  daily and Clopidogrel 75mg  daily for a minimum of 12 months (ACS-Class I recommendation).  4: COPD-  5: Anemia-  6: Hyperlipidemia-      Delma Freeze, FNP 04/03/23

## 2023-04-06 ENCOUNTER — Ambulatory Visit: Payer: Medicare HMO | Attending: Family | Admitting: Family

## 2023-04-06 ENCOUNTER — Encounter: Payer: Self-pay | Admitting: Family

## 2023-04-06 VITALS — BP 138/57 | HR 53 | Ht 64.0 in | Wt 169.4 lb

## 2023-04-06 DIAGNOSIS — Z79899 Other long term (current) drug therapy: Secondary | ICD-10-CM | POA: Diagnosis not present

## 2023-04-06 DIAGNOSIS — I272 Pulmonary hypertension, unspecified: Secondary | ICD-10-CM | POA: Insufficient documentation

## 2023-04-06 DIAGNOSIS — F17210 Nicotine dependence, cigarettes, uncomplicated: Secondary | ICD-10-CM | POA: Diagnosis not present

## 2023-04-06 DIAGNOSIS — E782 Mixed hyperlipidemia: Secondary | ICD-10-CM

## 2023-04-06 DIAGNOSIS — E1151 Type 2 diabetes mellitus with diabetic peripheral angiopathy without gangrene: Secondary | ICD-10-CM | POA: Insufficient documentation

## 2023-04-06 DIAGNOSIS — K219 Gastro-esophageal reflux disease without esophagitis: Secondary | ICD-10-CM | POA: Diagnosis not present

## 2023-04-06 DIAGNOSIS — D631 Anemia in chronic kidney disease: Secondary | ICD-10-CM | POA: Diagnosis not present

## 2023-04-06 DIAGNOSIS — I2510 Atherosclerotic heart disease of native coronary artery without angina pectoris: Secondary | ICD-10-CM | POA: Diagnosis not present

## 2023-04-06 DIAGNOSIS — I130 Hypertensive heart and chronic kidney disease with heart failure and stage 1 through stage 4 chronic kidney disease, or unspecified chronic kidney disease: Secondary | ICD-10-CM | POA: Insufficient documentation

## 2023-04-06 DIAGNOSIS — Z7982 Long term (current) use of aspirin: Secondary | ICD-10-CM | POA: Insufficient documentation

## 2023-04-06 DIAGNOSIS — Z955 Presence of coronary angioplasty implant and graft: Secondary | ICD-10-CM | POA: Diagnosis not present

## 2023-04-06 DIAGNOSIS — Z7902 Long term (current) use of antithrombotics/antiplatelets: Secondary | ICD-10-CM | POA: Insufficient documentation

## 2023-04-06 DIAGNOSIS — E785 Hyperlipidemia, unspecified: Secondary | ICD-10-CM | POA: Diagnosis not present

## 2023-04-06 DIAGNOSIS — Z720 Tobacco use: Secondary | ICD-10-CM

## 2023-04-06 DIAGNOSIS — I5032 Chronic diastolic (congestive) heart failure: Secondary | ICD-10-CM | POA: Diagnosis not present

## 2023-04-06 DIAGNOSIS — D649 Anemia, unspecified: Secondary | ICD-10-CM | POA: Diagnosis not present

## 2023-04-06 DIAGNOSIS — E039 Hypothyroidism, unspecified: Secondary | ICD-10-CM | POA: Diagnosis not present

## 2023-04-06 DIAGNOSIS — I10 Essential (primary) hypertension: Secondary | ICD-10-CM

## 2023-04-06 DIAGNOSIS — J449 Chronic obstructive pulmonary disease, unspecified: Secondary | ICD-10-CM | POA: Diagnosis not present

## 2023-04-06 DIAGNOSIS — E1122 Type 2 diabetes mellitus with diabetic chronic kidney disease: Secondary | ICD-10-CM | POA: Insufficient documentation

## 2023-04-06 DIAGNOSIS — N184 Chronic kidney disease, stage 4 (severe): Secondary | ICD-10-CM

## 2023-04-06 NOTE — Patient Instructions (Addendum)
It was a pleasure meeting you today!  Medication Changes:  No Changes In Medications at this time.   Lab Work:  Labs done today, your results will be available in MyChart, we will contact you for abnormal readings.  Follow-Up in: 1 month as scheduled   If you have any questions or concerns before your next appointment please send [place] a message through [place] or call our office at [phone] Monday-Friday 8 am-5 pm.   If you have an urgent need after hours on the weekend please call your Primary Cardiologist or the Advanced Heart Failure Clinic in [place] at [phone].   At the Advanced Heart Failure Clinic, you and your health needs are our priority. We have a designated team specialized in the treatment of Heart Failure. This Care Team includes your primary Heart Failure Specialized Cardiologist (physician), Advanced Practice Providers (APPs- Physician Assistants and Nurse Practitioners), and Pharmacist who all work together to provide you with the care you need, when you need it.   You may see any of the following providers on your designated Care Team at your next follow up:  Dr. Arvilla Meres Dr. Marca Ancona Dr. Dorthula Nettles Dr. Theresia Bough Tonye Becket, NP Robbie Lis, [place] [address] [place], [place] Brynda Peon, NP [place] Lee, NP Clarisa Kindred, NP Enos Fling, PharmD

## 2023-04-07 ENCOUNTER — Telehealth: Payer: Self-pay

## 2023-04-07 ENCOUNTER — Encounter: Payer: Self-pay | Admitting: Family

## 2023-04-07 DIAGNOSIS — I5032 Chronic diastolic (congestive) heart failure: Secondary | ICD-10-CM

## 2023-04-07 LAB — BASIC METABOLIC PANEL
BUN/Creatinine Ratio: 15 (ref 12–28)
BUN: 29 mg/dL — ABNORMAL HIGH (ref 8–27)
CO2: 25 mmol/L (ref 20–29)
Calcium: 9.4 mg/dL (ref 8.7–10.3)
Chloride: 95 mmol/L — ABNORMAL LOW (ref 96–106)
Creatinine, Ser: 1.99 mg/dL — ABNORMAL HIGH (ref 0.57–1.00)
Glucose: 101 mg/dL — ABNORMAL HIGH (ref 70–99)
Potassium: 4.2 mmol/L (ref 3.5–5.2)
Sodium: 139 mmol/L (ref 134–144)
eGFR: 27 mL/min/{1.73_m2} — ABNORMAL LOW (ref >59)

## 2023-04-07 LAB — CBC
Hematocrit: 34 % (ref 34.0–46.6)
Hemoglobin: 10.3 g/dL — ABNORMAL LOW (ref 11.1–15.9)
MCH: 26.8 pg (ref 26.6–33.0)
MCHC: 30.3 g/dL — ABNORMAL LOW (ref 31.5–35.7)
MCV: 88 fL (ref 79–97)
Platelets: 289 10*3/uL (ref 150–450)
RBC: 3.85 x10E6/uL (ref 3.77–5.28)
RDW: 21 % — ABNORMAL HIGH (ref 11.7–15.4)
WBC: 7.9 10*3/uL (ref 3.4–10.8)

## 2023-04-07 MED ORDER — TORSEMIDE 20 MG PO TABS: ORAL_TABLET | ORAL | 5 refills | Status: DC

## 2023-04-07 NOTE — Telephone Encounter (Signed)
-----   Message from Delma Freeze sent at 04/07/2023  8:08 AM EST ----- I already sent her a mychart message about her labs. Please place BMET order and update her torsemide to 20mg  daily with additional 20mg  PRN

## 2023-04-09 DIAGNOSIS — Z9981 Dependence on supplemental oxygen: Secondary | ICD-10-CM | POA: Diagnosis not present

## 2023-04-09 DIAGNOSIS — J4489 Other specified chronic obstructive pulmonary disease: Secondary | ICD-10-CM | POA: Diagnosis not present

## 2023-04-09 DIAGNOSIS — F17210 Nicotine dependence, cigarettes, uncomplicated: Secondary | ICD-10-CM | POA: Diagnosis not present

## 2023-04-09 DIAGNOSIS — G4733 Obstructive sleep apnea (adult) (pediatric): Secondary | ICD-10-CM | POA: Diagnosis not present

## 2023-04-09 DIAGNOSIS — J441 Chronic obstructive pulmonary disease with (acute) exacerbation: Secondary | ICD-10-CM | POA: Diagnosis not present

## 2023-04-12 NOTE — Progress Notes (Unsigned)
Cardiology Clinic Note   Date: 04/13/2023 ID: Misty Adams, DOB 1955-06-22, MRN [id number]  Primary Cardiologist:  Lorine Bears, MD  Patient Profile    Misty Adams is a 67 y.o. female who presents to the clinic today for hospital follow up.     Past medical history significant for: CAD. R/LHC 03/26/2023: Proximal RCA 45%.  Mid RCA 90%.  PCI with DES 3.0 x 22 mm to mid RCA. Chronic HFpEF. Echo 03/09/2023: EF 60 to 65%.  No RWMA.  Grade II DD.  Normal RV size/function.  Mild LAE.  Mild mitral stenosis, mean gradient 6 mmHg.  Moderate MAC.  Mild AS, mean gradient 9.7 mmHg. R/LHC 03/26/2023: Hemodynamic findings consistent with mild pulmonary hypertension (PAP 49/24-33 mmHg, PCWP 24 mmHg, transpulmonary gradient 9 mmHg). Hypertension. Hyperlipidemia. Lipid panel 10/16/2022: LDL 97, HDL 58, TG 133, total 181. LPa 03/27/2023: 54.2. Subclavian artery stenosis. Carotid duplex 07/15/2017: No evidence of stenosis left ICA.  Left subclavian artery was stenotic with disturbed flow.  Elevated velocities noted in the left proximal subclavian artery suggestive of > 50% stenosis. Aortic arch angiography 08/06/2017: Angioplasty and stenting left subclavian artery. Chronic venous insufficiency. COPD. Interstitial lung disease. GERD. Hypothyroidism. CKD stage IIIb. Tobacco abuse.  In summary, previously evaluated for chest pain in June 2011 with exercise Myoview which was without ischemia or infarct EF 79%.  In April 2019 she underwent stenting of left subclavian artery as above.  In March 2024 she was admitted to [facility] with E. coli UTI, severe sepsis, and AKI.  High-sensitivity troponin rose to 56.  She did not undergo cardiology evaluation.  In August 2024 CT chest performed secondary to weight loss showed aortic atherosclerosis, unchanged 4 mm right apical pulmonary nodule, enlarged pulmonary trunk consistent with PAH, left adrenal adenoma.  Patient presented to [facility] ED via EMS for shortness  of breath on 03/08/2023 and was admitted.  She reported a history of CHF managed by PCP with hydrochlorothiazide and Lasix.  She has never seen cardiology.  She reported stable home weight.  Every 2 to 3 months patient describes episodic tightness in jaws, arms, chest usually occurring at rest, lasting about 30 minutes, and resolving after aspirin.  She is very sedentary at home.  She continues to smoke 1 pack/day and has chronic DOE worsening over the past year.  Patient went on a [place] cruise the week prior to admission and reported feeling more dyspneic and fatigued while on vacation.  She returned home in the early a.m. on 11/17 and went to bed.  Later that morning after eating breakfast she became markedly dyspneic.  She contacted her son who is a paramedic and then EMS.  She was hypoxic in the field and started on BiPAP.  Upon arrival to ED she was afebrile and hypertensive at 175/68, 93% on BiPAP.  EKG showed sinus rhythm, 97 bpm with baseline artifact.  Initial labs: BUN 50, creatinine 2.01, hemoglobin 10.9, BNP 710.1, troponin 18.  Chest x-ray showed mild CHF.  She was treated with IV Lasix and SL NTG.  Patient was discharged on 03/10/2023.  Secondary to elevated BUN/creatinine, Lasix and potassium were held until follow-up.  Carvedilol was increased and amlodipine was added for hypertension.  Plan for outpatient ischemic evaluation.    Patient was seen for hospital follow-up on 03/16/2023.  Her PCP had started her on torsemide secondary to increased lower extremity edema and dyspnea.  At the time of her visit she continued to be volume up with approximately  9 pound weight gain since discharge from hospital.  She was instructed to increase torsemide for 3 days then resume daily dosing.     History of Present Illness    Misty Adams is followed by Dr. Kirke Corin for the above outlined history.  Patient was last seen in the office by me on 03/23/2023 for close follow-up.  She reported some initial  improvement in weight and lower extremity edema with increased dose of torsemide however she began gaining weight 2 days prior to visit.  In the office she was very dyspneic with low SpO2.  Breath sounds were diminished bilaterally she had evidence of JVD.  She was sent to the ED for further management of fluid overload.  Patient was admitted. Initial labs: Potassium 4.7, CRT 1.51, BUN 26, hemoglobin 9.2, BNP 554, troponin 8.  She was diuresed with IV Lasix.  She underwent R/LHC and had PCI with DES to mid RCA.  Hemodynamic findings consistent with mild pulmonary hypertension.  She was discharged on 03/28/2023 on aspirin, atorvastatin, Zetia, bisoprolol, Plavix, spironolactone, Jardiance, torsemide 40 mg daily.  Patient was seen by advanced heart failure clinic on 04/06/2023.  She reported fatigue with minimal exertion.  Dyspnea was improving.  Minimal lower extremity edema.  Daily weight was stable.  She was instructed to decrease torsemide to 20 mg daily with an additional 20 mg with weight gain or edema.  Today, patient is accompanied by her daughter in law. She reports doing better since hospital discharge. She reports mild lower extremity edema upon waking this morning but weight has was stable. She had a 4 lb weight gain last week for which she took an extra dose of torsemide. She has continued shortness of breath and DOE but it is improved from her last visit and she is not wearing supplemental O2 today. She does notice her SPO2 will drop when she does activities around her home causing her to sit and rest until it recovers. Discussed that heart failure is not the only thing contributing to her dyspnea given her ILD and continued smoking. She voiced understanding. She denies chest pain, pressure or tightness. BP at home is typically 120-130s. She is interested in cardiac rehab.      ROS: All other systems reviewed and are otherwise negative except as noted in History of Present Illness.  Studies  Reviewed    EKG is not performed today.   Physical Exam    VS:  BP 136/60 (BP Location: Left Arm, Patient Position: Sitting, Cuff Size: Normal)   Pulse (!) 53   Ht 5\' 4"  (1.626 m)   Wt 167 lb (75.8 kg)   SpO2 96%   BMI 28.67 kg/m  , BMI Body mass index is 28.67 kg/m.  GEN: Well nourished, well developed, in no acute distress. Neck: No JVD or carotid bruits. Cardiac:  RRR. 2/6 systolic murmur. No rubs or gallops.   Respiratory:  Respirations regular and unlabored. Clear to auscultation without rales, wheezing or rhonchi. GI: Soft, nontender, nondistended. Extremities: Radials/DP/PT 2+ and equal bilaterally. No clubbing or cyanosis. 1+ pitting edema bilateral lower extremities.   Skin: Warm and dry, no rash. Neuro: Strength intact.  Assessment & Plan   CAD S/p PCI with DES to mid RCA 03/26/2023.  Patient denies chest pain, pressure or tightness. She has started doing some things around the house like getting her dog fed and out in the mornings. She denies drainage or pain from cath site. Right wrist is well healed without drainage,  edema, erythema or tenderness. 2+ radial pulse. She is interested in cardiac rehab.  -Continue aspirin, Plavix, atorvastatin, Zetia, bisoprolol.  Chronic HFpEF Echo November 2024 showed normal LV/RV function, Grade II DD, mild LAE, mild mitral stenosis/aortic stenosis.  Patient with hospital admission 12/2-12/7.  Patient reports daily weight has been stable. She did have a 4 lb weight gain last week for which she managed with an extra dose of torsemide. She reports restricting dietary sodium as best she can. Also sticking to 65 oz of fluid daily. 1+ pitting edema bilateral lower extremities today. Breath sounds are diminished bilaterally without wheezing, rhonchi or rales.   -Continue torsemide, spironolactone, Jardiance, bisoprolol. -Keep follow-up visit with advanced heart failure clinic 1/16.  Hypertension BP today 152/56 on intake and 136/60 on my  recheck. Home BP has been in the 120-130s. No headaches or dizziness reported.  -Continue bisoprolol.  Hyperlipidemia LDL June 2024 97, not at goal. -Continue atorvastatin and Zetia.  Tobacco abuse Patient continues to smoke 1 pack per day. Discussed slowly decreasing cigarettes to complete cessation. She agrees to try.  -Patient will work toward complete cessation.   Disposition: BMP today as ordered by AHF clinic. Return in 3 months or sooner as needed.     Cardiac Rehabilitation Eligibility Assessment  The patient is ready to start cardiac rehabilitation from a cardiac standpoint.        Signed, Etta Grandchild. Charnell Peplinski, DNP, NP-C

## 2023-04-13 ENCOUNTER — Other Ambulatory Visit: Payer: Self-pay | Admitting: Family

## 2023-04-13 ENCOUNTER — Ambulatory Visit: Payer: Medicare HMO | Attending: Student | Admitting: Student

## 2023-04-13 ENCOUNTER — Encounter: Payer: Self-pay | Admitting: Student

## 2023-04-13 VITALS — BP 136/60 | HR 53 | Ht 64.0 in | Wt 167.0 lb

## 2023-04-13 DIAGNOSIS — I10 Essential (primary) hypertension: Secondary | ICD-10-CM

## 2023-04-13 DIAGNOSIS — I2510 Atherosclerotic heart disease of native coronary artery without angina pectoris: Secondary | ICD-10-CM

## 2023-04-13 DIAGNOSIS — E785 Hyperlipidemia, unspecified: Secondary | ICD-10-CM

## 2023-04-13 DIAGNOSIS — I5032 Chronic diastolic (congestive) heart failure: Secondary | ICD-10-CM | POA: Diagnosis not present

## 2023-04-13 DIAGNOSIS — Z720 Tobacco use: Secondary | ICD-10-CM

## 2023-04-13 NOTE — Patient Instructions (Signed)
Medication Instructions:  Your physician recommends that you continue on your current medications as directed. Please refer to the Current Medication list given to you today.   *If you need a refill on your cardiac medications before your next appointment, please call your pharmacy*   Lab Work: No labs ordered today    Testing/Procedures: No test ordered today    Follow-Up: At [facility], you and your health needs are our priority.  As part of our continuing mission to provide you with exceptional heart care, we have created designated Provider Care Teams.  These Care Teams include your primary Cardiologist (physician) and Advanced Practice Providers (APPs -  Physician Assistants and Nurse Practitioners) who all work together to provide you with the care you need, when you need it.  We recommend signing up for the patient portal called "MyChart".  Sign up information is provided on this After Visit Summary.  MyChart is used to connect with patients for Virtual Visits (Telemedicine).  Patients are able to view lab/test results, encounter notes, upcoming appointments, etc.  Non-urgent messages can be sent to your provider as well.   To learn more about what you can do with MyChart, go to [email or web address].    Your next appointment:   3 month(s)  Provider:   You may see Lorine Bears, MD or one of the following Advanced Practice Providers on your designated Care Team:   Nicolasa Ducking, NP Eula Listen, PA-C Cadence Fransico Michael, PA-C Charlsie Quest, NP Carlos Levering, NP

## 2023-04-13 NOTE — Addendum Note (Signed)
Addended by: Parke Poisson on: 04/13/2023 10:46 AM   Modules accepted: Orders

## 2023-04-14 LAB — BASIC METABOLIC PANEL
BUN/Creatinine Ratio: 15 (ref 12–28)
BUN: 23 mg/dL (ref 8–27)
CO2: 25 mmol/L (ref 20–29)
Calcium: 9.2 mg/dL (ref 8.7–10.3)
Chloride: 98 mmol/L (ref 96–106)
Creatinine, Ser: 1.52 mg/dL — ABNORMAL HIGH (ref 0.57–1.00)
Glucose: 83 mg/dL (ref 70–99)
Potassium: 4.1 mmol/L (ref 3.5–5.2)
Sodium: 141 mmol/L (ref 134–144)
eGFR: 37 mL/min/{1.73_m2} — ABNORMAL LOW (ref >59)

## 2023-04-17 ENCOUNTER — Ambulatory Visit: Payer: Self-pay | Admitting: *Deleted

## 2023-04-17 DIAGNOSIS — I509 Heart failure, unspecified: Secondary | ICD-10-CM

## 2023-04-17 NOTE — Patient Outreach (Signed)
  Care Coordination   Initial Visit Note   04/17/2023 Name: Misty Adams MRN: [id number] DOB: [birth year]  Misty Adams is a 67 y.o. year old female who sees Danella Penton, MD for primary care. I spoke with  Misty Adams by phone today.  What matters to the patients health and wellness today?  Patient recent discharged from hospital for CHF, feels better now but still has some shortness of breath and ankle swelling she is trying to manage.  Denies any urgent concerns, encouraged to contact this care manager with questions.     Goals Addressed             This Visit's Progress    Management of chronic medical conditions       Interventions Today    Flowsheet Row Most Recent Value  Chronic Disease   Chronic disease during today's visit Congestive Heart Failure (CHF), Hypertension (HTN), Chronic Obstructive Pulmonary Disease (COPD), Chronic Kidney Disease/End Stage Renal Disease (ESRD)  General Interventions   General Interventions Discussed/Reviewed General Interventions Reviewed, Durable Medical Equipment (DME), Doctor Visits  [Children provide transportation to medical appointments]  Doctor Visits Discussed/Reviewed PCP, Doctor Visits Reviewed, Specialist  [upcoming cancer center 1/9, GI 1/14, HF clinic 1/16, and PCP1/21]  Durable Medical Equipment (DME) Dan Humphreys, Oxygen  [Report per PT eval, she could use a rollator, but does not need PT sessions.  She will discuss with PCP. Home O2 at night, but has been using more often for shortness of breath]  PCP/Specialist Visits Compliance with follow-up visit  Exercise Interventions   Exercise Discussed/Reviewed Weight Managment  Weight Management Weight maintenance  [daily weights, aware to notifiy provider if more than 3 pounds overnight or more than 5 in a week]  Education Interventions   Education Provided Provided Education  Provided Verbal Education On Nutrition, Medication, When to see the doctor  [report taking  medications as instructed, Extra torsemide as needed. Continues to smoke, does not have desire to stop.]  Nutrition Interventions   Nutrition Discussed/Reviewed Nutrition Reviewed, Decreasing fats, Fluid intake, Decreasing salt, Adding fruits and vegetables  Pharmacy Interventions   Pharmacy Dicussed/Reviewed Affording Medications, Pharmacy Topics Reviewed, Referral to Pharmacist  Referral to Pharmacist Cannot afford medications  [Report Jardiance and inhalers are too expensive]              SDOH assessments and interventions completed:  Yes  SDOH Interventions Today    Flowsheet Row Most Recent Value  SDOH Interventions   Food Insecurity Interventions Intervention Not Indicated  Housing Interventions Intervention Not Indicated  Transportation Interventions Intervention Not Indicated  Utilities Interventions Intervention Not Indicated        Care Coordination Interventions:  Yes, provided   Follow up plan: Follow up call scheduled for 1/23    Encounter Outcome:  Patient Visit Completed   Rodney Langton, RN, MSN, CCM [place]  [facility], [facility] RN Care Coordinator Direct Dial: [phone] / Main [phone] Fax [phone] Email: [email or web address]: [email or web address]

## 2023-04-24 ENCOUNTER — Encounter: Payer: Self-pay | Admitting: Internal Medicine

## 2023-04-30 ENCOUNTER — Inpatient Hospital Stay: Payer: Medicare Other | Admitting: Internal Medicine

## 2023-04-30 ENCOUNTER — Inpatient Hospital Stay: Payer: Medicare Other | Attending: Internal Medicine

## 2023-04-30 ENCOUNTER — Encounter: Payer: Self-pay | Admitting: Internal Medicine

## 2023-04-30 ENCOUNTER — Inpatient Hospital Stay: Payer: Medicare Other

## 2023-04-30 ENCOUNTER — Telehealth: Payer: Self-pay

## 2023-04-30 VITALS — BP 123/42 | HR 59

## 2023-04-30 VITALS — BP 121/51 | HR 70 | Temp 97.7°F | Ht 64.0 in | Wt 165.0 lb

## 2023-04-30 DIAGNOSIS — D649 Anemia, unspecified: Secondary | ICD-10-CM | POA: Diagnosis not present

## 2023-04-30 DIAGNOSIS — Z79899 Other long term (current) drug therapy: Secondary | ICD-10-CM | POA: Diagnosis not present

## 2023-04-30 DIAGNOSIS — Z5986 Financial insecurity: Secondary | ICD-10-CM

## 2023-04-30 DIAGNOSIS — D509 Iron deficiency anemia, unspecified: Secondary | ICD-10-CM | POA: Diagnosis present

## 2023-04-30 LAB — CBC WITH DIFFERENTIAL (CANCER CENTER ONLY)
Abs Immature Granulocytes: 0.03 10*3/uL (ref 0.00–0.07)
Basophils Absolute: 0 10*3/uL (ref 0.0–0.1)
Basophils Relative: 1 %
Eosinophils Absolute: 0.2 10*3/uL (ref 0.0–0.5)
Eosinophils Relative: 2 %
HCT: 31.9 % — ABNORMAL LOW (ref 36.0–46.0)
Hemoglobin: 9.3 g/dL — ABNORMAL LOW (ref 12.0–15.0)
Immature Granulocytes: 1 %
Lymphocytes Relative: 24 %
Lymphs Abs: 1.6 10*3/uL (ref 0.7–4.0)
MCH: 26.6 pg (ref 26.0–34.0)
MCHC: 29.2 g/dL — ABNORMAL LOW (ref 30.0–36.0)
MCV: 91.1 fL (ref 80.0–100.0)
Monocytes Absolute: 0.4 10*3/uL (ref 0.1–1.0)
Monocytes Relative: 6 %
Neutro Abs: 4.4 10*3/uL (ref 1.7–7.7)
Neutrophils Relative %: 66 %
Platelet Count: 277 10*3/uL (ref 150–400)
RBC: 3.5 MIL/uL — ABNORMAL LOW (ref 3.87–5.11)
RDW: 18.3 % — ABNORMAL HIGH (ref 11.5–15.5)
WBC Count: 6.5 10*3/uL (ref 4.0–10.5)
nRBC: 0 % (ref 0.0–0.2)

## 2023-04-30 LAB — BASIC METABOLIC PANEL
Anion gap: 11 (ref 5–15)
BUN: 25 mg/dL — ABNORMAL HIGH (ref 8–23)
CO2: 29 mmol/L (ref 22–32)
Calcium: 9.3 mg/dL (ref 8.9–10.3)
Chloride: 98 mmol/L (ref 98–111)
Creatinine, Ser: 1.77 mg/dL — ABNORMAL HIGH (ref 0.44–1.00)
GFR, Estimated: 31 mL/min — ABNORMAL LOW (ref >60)
Glucose, Bld: 106 mg/dL — ABNORMAL HIGH (ref 70–99)
Potassium: 3.4 mmol/L — ABNORMAL LOW (ref 3.5–5.1)
Sodium: 138 mmol/L (ref 135–145)

## 2023-04-30 LAB — FERRITIN: Ferritin: 10 ng/mL — ABNORMAL LOW (ref 11–307)

## 2023-04-30 LAB — IRON AND TIBC
Iron: 25 ug/dL — ABNORMAL LOW (ref 28–170)
Saturation Ratios: 5 % — ABNORMAL LOW (ref 10.4–31.8)
TIBC: 462 ug/dL — ABNORMAL HIGH (ref 250–450)
UIBC: 437 ug/dL

## 2023-04-30 MED ORDER — IRON SUCROSE 20 MG/ML IV SOLN
200.0000 mg | Freq: Once | INTRAVENOUS | Status: AC
  Administered 2023-04-30: 200 mg via INTRAVENOUS
  Filled 2023-04-30: qty 10

## 2023-04-30 MED ORDER — SODIUM CHLORIDE 0.9% FLUSH
10.0000 mL | Freq: Once | INTRAVENOUS | Status: AC | PRN
  Administered 2023-04-30: 10 mL
  Filled 2023-04-30: qty 10

## 2023-04-30 NOTE — Progress Notes (Addendum)
   04/30/2023  Patient ID: Misty Adams, female   DOB: Sep 14, 1955, 68 y.o.   MRN: [id number]    Reason for referral: Medication Assistance with Jardiance  and Inhalers  Referral source:  Odella Ku, RN [facility] Care Management Care Coordination. Current insurance: [facility]  Reason for call: Medication Assistance   Outreach:  Unsuccessful telephone call attempt #1 to patient.   HIPAA compliant voicemail left requesting a return call  Plan:  -I will make another outreach attempt to patient within 3-4 business days.   Thank you for allowing pharmacy to be a part of this patient's care. Dorcas Solian, PharmD Clinical Pharmacist Cell: [phone]

## 2023-04-30 NOTE — Patient Instructions (Signed)
#  Recommend gentle iron [iron biglycinate; 28 mg ] 1 pill a day.  This pill is unlikely to cause stomach upset or cause constipation.

## 2023-04-30 NOTE — Progress Notes (Signed)
 Fatigue/weakness: yes Dyspena: yes O2-2L qhs Light headedness: no Blood in stool: can't tell/dark  Has seen at [facility] since last visit 03/28/23 for heart failure per pt. Stent placed. Had iron infusion while there.  Appetite 25% normal, no supplement drinks.  Has constipation, using dulcolax. Having 2-3 Bms per week.

## 2023-04-30 NOTE — Progress Notes (Signed)
 [facility] CONSULT NOTE  Patient Care Team: Danella Penton, MD as PCP - General (Internal Medicine) Iran Ouch, MD as PCP - Cardiology (Cardiology) Earna Coder, MD as Consulting Physician (Oncology)  CHIEF COMPLAINTS/PURPOSE OF CONSULTATION: ANEMIA   HEMATOLOGY HISTORY  # ANEMIA[Hb; MCV-platelets- WBC; Iron sat; ferritin;  Kidney: stage III CKD- 30-40s.  CT/US- ;  colo/EGD: 2022;  EGD/colo- awaiting repeating EGD/Dr.Russow-   COPD with ongoing tobacco abuse, abnormal CT chest consistent with interstitial lung disease with recent pulmonary consultation, depression, diabetes, hypertension, PAD  HISTORY OF PRESENTING ILLNESS: Patient ambulating-independently. Alone.   Misty Adams 68 y.o.  female pleasant patient with multiple medical problems and iron deficient anemia is here for follow-up.  Patient was admitted to the hospital since last visit for CHF status post and placement.  Patient also had iron infusions for hemoglobin of 8.1.  Also her diuretics were changed.   Patient is currently on iron sulfate.  Has constipation, using dulcolax. Having 2-3 Bms per we  Patient denies any blood in stools.  Denies any black loose stool.  Patient s/p EGD colonoscopy.    Review of Systems  Constitutional:  Positive for malaise/fatigue. Negative for chills, diaphoresis, fever and weight loss.  HENT:  Negative for nosebleeds and sore throat.   Eyes:  Negative for double vision.  Respiratory:  Negative for cough, hemoptysis, sputum production, shortness of breath and wheezing.   Cardiovascular:  Negative for chest pain, palpitations, orthopnea and leg swelling.  Gastrointestinal:  Negative for abdominal pain, blood in stool, constipation, diarrhea, heartburn, melena, nausea and vomiting.  Genitourinary:  Negative for dysuria, frequency and urgency.  Musculoskeletal:  Negative for back pain and joint pain.  Skin: Negative.  Negative for itching and rash.   Neurological:  Negative for dizziness, tingling, focal weakness, weakness and headaches.  Endo/Heme/Allergies:  Does not bruise/bleed easily.  Psychiatric/Behavioral:  Negative for depression. The patient is not nervous/anxious and does not have insomnia.    MEDICAL HISTORY:  Past Medical History:  Diagnosis Date   Allergy    Anxiety    Chronic heart failure with preserved ejection fraction (HFpEF) (HCC)    a. 02/2023 Echo: EF 60-65%, no rwma, GrII DD, nl RV size/fxn, mild LAE, mild MS (mean grad ), mod MAC, mild AS (mean grad 9.72mmHg).   CKD (chronic kidney disease), stage III (HCC)    COPD (chronic obstructive pulmonary disease) (HCC)    Depression    Diabetes mellitus without complication (HCC)    Hyperlipidemia    Hypertension    Hypothyroidism    Interstitial lung disease (HCC)    PAD (peripheral artery disease) (HCC)    a. 07/2017 L subclavian stenosis s/p stenting.   Psoriasis    Substance abuse (HCC)    Tobacco abuse     SURGICAL HISTORY: Past Surgical History:  Procedure Laterality Date   ABDOMINAL HYSTERECTOMY     AORTIC ARCH ANGIOGRAPHY N/A 08/06/2017   Procedure: AORTIC ARCH ANGIOGRAPHY;  Surgeon: Fransisco Hertz, MD;  Location: [facility] INVASIVE CV LAB;  Service: Cardiovascular;  Laterality: N/A;   APPENDECTOMY     BIOPSY  12/19/2022   Procedure: BIOPSY;  Surgeon: Jaynie Collins, DO;  Location: [facility] ENDOSCOPY;  Service: Gastroenterology;;   CESAREAN SECTION     COLONOSCOPY     COLONOSCOPY WITH PROPOFOL N/A 12/07/2020   Procedure: COLONOSCOPY WITH PROPOFOL;  Surgeon: Jaynie Collins, DO;  Location: [facility] ENDOSCOPY;  Service: Gastroenterology;  Laterality: N/A;   COLONOSCOPY  WITH PROPOFOL N/A 12/19/2022   Procedure: COLONOSCOPY WITH PROPOFOL;  Surgeon: Jaynie Collins, DO;  Location: [facility] ENDOSCOPY;  Service: Gastroenterology;  Laterality: N/A;   CORONARY STENT INTERVENTION N/A 03/26/2023   Procedure: CORONARY STENT INTERVENTION;  Surgeon: Marykay Lex, MD;  Location: [facility] INVASIVE CV LAB;  Service: Cardiovascular;  Laterality: N/A;   dilatation and curettage     DILATION AND CURETTAGE OF UTERUS     ESOPHAGOGASTRODUODENOSCOPY N/A 12/07/2020   Procedure: ESOPHAGOGASTRODUODENOSCOPY (EGD);  Surgeon: Jaynie Collins, DO;  Location: [facility] ENDOSCOPY;  Service: Gastroenterology;  Laterality: N/A;   ESOPHAGOGASTRODUODENOSCOPY (EGD) WITH PROPOFOL N/A 12/19/2022   Procedure: ESOPHAGOGASTRODUODENOSCOPY (EGD) WITH PROPOFOL;  Surgeon: Jaynie Collins, DO;  Location: [facility] ENDOSCOPY;  Service: Gastroenterology;  Laterality: N/A;   KNEE SURGERY     left wrist ganglion cyst removal     OOPHORECTOMY  1994   PERIPHERAL VASCULAR INTERVENTION Left 08/06/2017   Procedure: PERIPHERAL VASCULAR INTERVENTION;  Surgeon: Fransisco Hertz, MD;  Location: [facility] INVASIVE CV LAB;  Service: Cardiovascular;  Laterality: Left;  subclavian   POLYPECTOMY  12/19/2022   Procedure: POLYPECTOMY;  Surgeon: Jaynie Collins, DO;  Location: [facility] ENDOSCOPY;  Service: Gastroenterology;;   RIGHT/LEFT HEART CATH AND CORONARY ANGIOGRAPHY N/A 03/26/2023   Procedure: RIGHT/LEFT HEART CATH AND CORONARY ANGIOGRAPHY;  Surgeon: Marykay Lex, MD;  Location: [facility] INVASIVE CV LAB;  Service: Cardiovascular;  Laterality: N/A;   TUBAL LIGATION      SOCIAL HISTORY: Social History   Socioeconomic History   Marital status: Divorced    Spouse name: Not on file   Number of children: 5   Years of education: Not on file   Highest education level: Associate degree: academic program  Occupational History   Occupation: Retired  Tobacco Use   Smoking status: Every Day    Current packs/day: 2.00    Average packs/day: 2.0 packs/day for 50.0 years (100.0 ttl pk-yrs)    Types: Cigarettes   Smokeless tobacco: Never  Vaping Use   Vaping status: Former   Substances: Nicotine  Substance and Sexual Activity   Alcohol use: Yes   Drug use: Not Currently   Sexual activity: Not Currently   Other Topics Concern   Not on file  Social History Narrative   Lives w/ 3 of her children   Social Drivers of Health   Financial Resource Strain: High Risk (05/07/2023)   Received from [facility]   Overall Financial Resource Strain (CARDIA)    Difficulty of Paying Living Expenses: Hard  Food Insecurity: No Food Insecurity (07/07/2023)   Hunger Vital Sign    Worried About Running Out of Food in the Last Year: Never true    Ran Out of Food in the Last Year: Never true  Recent Concern: Food Insecurity - Food Insecurity Present (05/07/2023)   Received from [facility]   Hunger Vital Sign    Worried About Running Out of Food in the Last Year: Sometimes true    Ran Out of Food in the Last Year: Sometimes true  Transportation Needs: No Transportation Needs (07/07/2023)   PRAPARE - Administrator, Civil Service (Medical): No    Lack of Transportation (Non-Medical): No  Physical Activity: Not on file  Stress: Not on file  Social Connections: Moderately Isolated (07/07/2023)   Social Connection and Isolation Panel [NHANES]    Frequency of Communication with Friends and Family: More than three times a week    Frequency of  Social Gatherings with Friends and Family: More than three times a week    Attends Religious Services: Never    Database administrator or Organizations: No    Attends Engineer, structural: 1 to 4 times per year    Marital Status: Divorced  Catering manager Violence: Not At Risk (07/07/2023)   Humiliation, Afraid, Rape, and Kick questionnaire    Fear of Current or Ex-Partner: No    Emotionally Abused: No    Physically Abused: No    Sexually Abused: No    FAMILY HISTORY: Family History  Problem Relation Age of Onset   Uterine cancer Mother    Stroke Mother    Aneurysm Mother    Heart attack Father    Coronary artery disease Father    Leukemia Father    Depression Father    Diabetes Father    Hyperlipidemia  Father    Hypertension Father    Colon polyps Sister    Obesity Sister    Thyroid disease Sister    COPD Sister    Lung cancer Sister    Breast cancer Maternal Aunt    Breast cancer Maternal Aunt     ALLERGIES:  is allergic to hydrocodone-acetaminophen, vicodin [hydrocodone-acetaminophen], and trelegy ellipta [fluticasone-umeclidin-vilant].  MEDICATIONS:  Current Outpatient Medications  Medication Sig Dispense Refill   ACCU-CHEK GUIDE TEST test strip      Accu-Chek Softclix Lancets lancets SMARTSIG:Lancet Topical     acetaminophen (TYLENOL) 500 MG tablet Take 1,000 mg by mouth every 6 (six) hours as needed for moderate pain or headache.     albuterol (VENTOLIN HFA) 108 (90 Base) MCG/ACT inhaler Inhale 2 puffs into the lungs every 6 (six) hours as needed for wheezing or shortness of breath. 1 each 3   ALPRAZolam (XANAX) 0.5 MG tablet Take 0.5 mg by mouth 2 (two) times daily as needed for anxiety.      aspirin 81 MG tablet Take 81 mg by mouth at bedtime.      empagliflozin (JARDIANCE) 10 MG TABS tablet Take 1 tablet (10 mg total) by mouth daily. 30 tablet 1   ferrous sulfate 325 (65 FE) MG tablet Take 1 tablet (325 mg total) by mouth daily with breakfast. 30 tablet 3   gabapentin (NEURONTIN) 100 MG capsule Take 200 mg by mouth at bedtime.     guaiFENesin-dextromethorphan (ROBITUSSIN DM) 100-10 MG/5ML syrup Take 10 mLs by mouth every 4 (four) hours as needed for cough. 118 mL 0   levothyroxine (SYNTHROID, LEVOTHROID) 125 MCG tablet Take 125 mcg by mouth daily.      ondansetron (ZOFRAN) 4 MG tablet Take 4 mg by mouth every 4 (four) hours as needed for nausea or vomiting.     OXYGEN Inhale 2 L/L into the lungs at bedtime.     pantoprazole (PROTONIX) 40 MG tablet Take 40 mg by mouth 2 (two) times daily.     traZODone (DESYREL) 100 MG tablet Take 100 mg by mouth daily as needed for sleep.     atorvastatin (LIPITOR) 40 MG tablet Take 1 tablet (40 mg total) by mouth at bedtime. 30 tablet 0    bisoprolol (ZEBETA) 5 MG tablet TAKE 1 TABLET (5 MG TOTAL) BY MOUTH DAILY. 30 tablet 5   clopidogrel (PLAVIX) 75 MG tablet Take 1 tablet (75 mg total) by mouth daily with breakfast. 90 tablet 3   ezetimibe (ZETIA) 10 MG tablet TAKE 1 TABLET BY MOUTH EVERY DAY (Patient not taking: Reported on 07/07/2023) 30 tablet  5   oxyCODONE-acetaminophen (PERCOCET) 5-325 MG tablet Take 1 tablet by mouth every 4 (four) hours as needed for severe pain (pain score 7-10). 20 tablet 0   potassium chloride SA (KLOR-CON M20) 20 MEQ tablet Take 20 mEq by mouth daily.     spironolactone (ALDACTONE) 25 MG tablet TAKE 1 TABLET (25 MG TOTAL) BY MOUTH DAILY. 30 tablet 5   torsemide (DEMADEX) 20 MG tablet Take 20 mg by mouth 2 (two) times daily.     No current facility-administered medications for this visit.     PHYSICAL EXAMINATION:   Vitals:   04/30/23 0941  BP: (!) 121/51  Pulse: 70  Temp: 97.7 F (36.5 C)  SpO2: 91%     Filed Weights   04/30/23 0941  Weight: 165 lb (74.8 kg)      Physical Exam Vitals and nursing note reviewed.  HENT:     Head: Normocephalic and atraumatic.     Mouth/Throat:     Pharynx: Oropharynx is clear.  Eyes:     Extraocular Movements: Extraocular movements intact.     Pupils: Pupils are equal, round, and reactive to light.  Cardiovascular:     Rate and Rhythm: Normal rate and regular rhythm.  Pulmonary:     Comments: Decreased breath sounds bilaterally.  Abdominal:     Palpations: Abdomen is soft.  Musculoskeletal:        General: Normal range of motion.     Cervical back: Normal range of motion.  Skin:    General: Skin is warm.  Neurological:     General: No focal deficit present.     Mental Status: She is alert and oriented to person, place, and time.  Psychiatric:        Behavior: Behavior normal.        Judgment: Judgment normal.      LABORATORY DATA:  I have reviewed the data as listed Lab Results  Component Value Date   WBC 10.8 (H) 07/16/2023    HGB 8.1 (L) 07/16/2023   HCT 28.1 (L) 07/16/2023   MCV 86.7 07/16/2023   PLT 276 07/16/2023   Recent Labs    12/08/22 1446 01/21/23 1346 03/08/23 2325 03/09/23 0648 07/06/23 2211 07/07/23 0207 07/08/23 0518 07/15/23 1055  NA 134*   < > 139   < > 134* 132* 142 141  K 3.1*   < > 4.3   < > 2.4* 2.9* 4.4 3.7  CL 92*   < > 103   < > 94* 99 112* 99  CO2 31   < > 26   < > 27 23 25 26   GLUCOSE 118*   < > 147*   < > 137* 267* 97 77  BUN 25*   < > 50*   < > 69* 63* 44* 42*  CREATININE 1.38*   < > 2.01*   < > 2.15* 2.07* 1.59* 1.70*  CALCIUM 9.3   < > 8.9   < > 9.4 8.4* 8.8* 9.5  GFRNONAA 41*   < > 26*   < > 24* 25* 35*  --   PROT 7.5  --  7.2  --  6.6  --   --   --   ALBUMIN 3.7  --  3.4*  --  3.5  --   --   --   AST 14*  --  15  --  10*  --   --   --   ALT 11  --  21  --  9  --   --   --   ALKPHOS 67  --  80  --  55  --   --   --   BILITOT 0.4  --  0.9  --  0.6  --   --   --    < > = values in this interval not displayed.     CT Lumbar Spine Wo Contrast Result Date: 07/06/2023 CLINICAL DATA:  Fall injury with low back pain. EXAM: CT LUMBAR SPINE WITHOUT CONTRAST TECHNIQUE: Multidetector CT imaging of the lumbar spine was performed without intravenous contrast administration. Multiplanar CT image reconstructions were also generated. RADIATION DOSE REDUCTION: This exam was performed according to the departmental dose-optimization program which includes automated exposure control, adjustment of the mA and/or kV according to patient size and/or use of iterative reconstruction technique. COMPARISON:  CT abdomen pelvis without contrast 10/16/2022. FINDINGS: Segmentation: Standard. Alignment: There is a mild chronic levoscoliosis, and mild chronic grade 1 L5-S1 anterolisthesis, the latter likely related to facet hypertrophy. No other AP listhesis is seen. Vertebrae: No acute fracture is evident or focal pathologic process. The bones are mildly demineralized. The SI joints are patent with vacuum  phenomenon and mild spurring. No sacral insufficiency fracture is seen. Paraspinal and other soft tissues: Moderate to heavy aortoiliac and visceral branch vessel atherosclerosis. Stable 3 cm fusiform infrarenal AAA. Stable left adrenal adenomas. No paraspinal hematoma or mass. Disc levels: The lumbar discs are preserved in heights. There is a left far lateral disc bulge L2-3 but no significant posterior bulge and no herniation or stenosis. At L3-4 there is a mild diffuse annular bulge but no herniation or canal stenosis. There is mild facet spurring without foraminal stenosis. At L4-5, there is a mild left paracentral disc bulge without herniation or stenosis. there Is mild facet hypertrophy without foraminal compromise. At L5-S1 there is advanced left and moderate right facet hypertrophy, grade 1 spondylolisthesis. There is mild posterior disc bulge without herniation or canal stenosis. The foramina are mildly stenotic. IMPRESSION: 1. Osteopenia, scoliosis and degenerative changes without evidence of fracture. 2. Grade 1 degenerative anterolisthesis chronically at L5-S1. 3. Multilevel disc bulges with herniation or canal stenosis. 4. Mild foraminal stenosis L5-S1. 5. Pole aortic atherosclerosis with stable 3 cm infrarenal AAA. Electronically Signed   By: Almira Bar M.D.   On: 07/06/2023 23:24   DG Chest 2 View Result Date: 07/06/2023 CLINICAL DATA:  Fluid retention, coughing, chronic diastolic heart failure. EXAM: CHEST - 2 VIEW COMPARISON:  Portable chest 03/24/2023 FINDINGS: Cardiac size is upper limits of normal with improvement. There is mild central vascular prominence but no overt edema with resolution of the prior edema. The mediastinum is stable. There is calcification in the aortic knob and left subclavian artery stenting. There is no consolidation, effusion or pneumothorax. There is thoracic kyphosis with spondylosis and multilevel bridging enthesopathy. Osteopenia. IMPRESSION: Mild vascular  prominence without edema. No other evidence of acute chest disease. Aortic atherosclerosis. Electronically Signed   By: Almira Bar M.D.   On: 07/06/2023 23:04   CT Head Wo Contrast Result Date: 07/06/2023 CLINICAL DATA:  Fall injury with head trauma and low back pain. EXAM: CT HEAD WITHOUT CONTRAST TECHNIQUE: Contiguous axial images were obtained from the base of the skull through the vertex without intravenous contrast. RADIATION DOSE REDUCTION: This exam was performed according to the departmental dose-optimization program which includes automated exposure control, adjustment of the mA and/or kV according to patient size and/or use of iterative reconstruction technique.  COMPARISON:  MRI brain 04/24/2022. FINDINGS: Brain: There is mild cerebral atrophy and small vessel disease with unremarkable cerebellum and brainstem. No acute cortical based infarct, hemorrhage, mass, mass effect or midline shift are seen. The ventricles are normal in size and position. There are tiny chronic lacunar infarcts in both external capsules. Basal cisterns are patent. Vascular: There are scattered calcifications in both siphons. No hyperdense central vessel is seen. Skull: Negative for fractures or focal lesions. Sinuses/Orbits: No acute findings. Clear sinuses and mastoids. Negative orbits. Other: None. IMPRESSION: No acute intracranial CT findings, depressed skull fractures or interval changes. Chronic changes. Electronically Signed   By: Almira Bar M.D.   On: 07/06/2023 23:00    ASSESSMENT & PLAN:   Acute on chronic anemia # Anemia- Hb-8.3; Ferritin- 6 [PCP- AUG 2024.]- WBC/platelets are normal. Patient is symptomatic.  Likely due to iron deficiency - from etiology GI blood loss/ CKD- III. Also Recommend gentle iron [iron biglycinate; 28 mg ] 1 pill a day.  This pill is unlikely to cause stomach upset or cause constipation.  Stop iron sulfate.  #  Currently s/p Venofer; Hb 9.3- proceed with venofer-if no  significant improvement noted on Venofer would recommend erythropoietin injection.  #Etiology of iron deficiency:s/p AUG [[facility]]-EGD-duodenal ulcer;  colonoscopy- multiple polyps; JULY 2024- CT scan abdomen pelvis- no acute process.    # CAD s/p stent- CHF [dec 2024, Dr.Arida] asprin + plavix  # CKD- III [PCP]/ DM - on Toesermide/Spirinolactine weight gain- defer to PCP.    # DISPOSITION: # venfoer today # venofer q 2 weeks x 2 more # Follow up 3 months- MD; labs- cbc/bmp; iron studies; ferritin; possible venofer-  Dr.B  All questions were answered. The patient knows to call the clinic with any problems, questions or concerns.   Earna Coder, MD 07/17/2023 3:38 PM

## 2023-04-30 NOTE — Assessment & Plan Note (Addendum)
#   Anemia- Hb-8.3; Ferritin- 6 [PCP- AUG 2024.]- WBC/platelets are normal. Patient is symptomatic.  Likely due to iron  deficiency - from etiology GI blood loss/ CKD- III. Also Recommend gentle iron  [iron  biglycinate; 28 mg ] 1 pill a day.  This pill is unlikely to cause stomach upset or cause constipation.  Stop iron  sulfate.  #  Currently s/p Venofer ; Hb 9.3- proceed with venofer -if no significant improvement noted on Venofer  would recommend erythropoietin injection.  #Etiology of iron  deficiency:s/p AUG [[facility]]-EGD-duodenal ulcer;  colonoscopy- multiple polyps; JULY 2024- CT scan abdomen pelvis- no acute process.    # CAD s/p stent- CHF [dec 2024, Dr.Arida] asprin + plavix   # CKD- III [PCP]/ DM - on Toesermide/Spirinolactine weight gain- defer to PCP.    # DISPOSITION: # venfoer today # venofer  q 2 weeks x 2 more # Follow up 3 months- MD; labs- cbc/bmp; iron  studies; ferritin; possible venofer -  Dr.B

## 2023-05-05 NOTE — Progress Notes (Addendum)
Advanced Heart Failure Clinic Note   PCP: Danella Penton, MD (last seen 11/24) Cardiologist: Lorine Bears, MD/ Carlos Levering, NP (last seen 12/24)  Chief Complaint: shortness of breath  HPI:  Misty Adams is a 68 y/o female with a history of COPD, HTN, hyperlipidemia, mild pHTN, GERD, subclavian artery stenosis (stented 07/2017), CKD, pulmonary HTN, CAD, iron deficiency anemia, hypothyroidism, PVD, tobacco use and chronic heart failure.   Admitted 07/14/22 with E. coli UTI, severe sepsis, and AKI. High-sensitivity troponin rose to 56. She did not undergo cardiology evaluation. In August 2024 CT chest performed secondary to weight loss showed aortic atherosclerosis, unchanged 4 mm right apical pulmonary nodule, enlarged pulmonary trunk consistent with PAH, left adrenal adenoma.   Admitted 03/08/23 due to waking up short of breath & orthopnea after returning from a cruise. BNP 710. CXR showed heart failure. Initially placed on bipap and weaned down to nasal cannula. IV lasix given. Hypokalemia corrected. Echocardiogram performed on 03/08/2023 showed ejection fraction 60 to 65% with grade 2 diastolic dysfunction. Symptoms improved.   Admitted 03/23/23 due to worsening of shortness of breath, leg swelling and significant weight gain. Developed hypoxia and placed on 5L nasal cannula. BNP elevated and given IV lasix. Cardiology consulted. Chest x-ray showed evidence of pulmonary edema with pleural effusion. CT shows signs consistent with pulmonary hypertension. Oxygen weaned down to 2L. L and R heart cath 12/5 with stent placed to RCA. Hypokalemia corrected.   Echo 03/09/23: EF 60-65% with Grade II DD, mild LAE, mild AS  RHC/ LHC 03/26/23:   Prox RCA lesion is 45% stenosed.   CULPRIT LESION: Mid RCA lesion is 90% stenosed.   A drug-eluting stent was successfully placed using a STENT ONYX FRONTIER 3.0X22.   Post intervention, there is a 0% residual stenosis.    -------------------------------------   Hemodynamic findings consistent with mild pulmonary hypertension.  (PAP 49/24-33 mmHg, PCWP 24 mmHg transpulmonary Gradient 9 mmHg); LVEDP 16 mmHg.   Compensated CHF: LVEDP 16 mmHg, PCWP 24 mmHg.   Recommend uninterrupted dual antiplatelet therapy with Aspirin 81mg  daily and Clopidogrel 75mg  daily for a minimum of 12 months (ACS-Class I recommendation).  She presents for a HF follow-up visit with a chief complaint of moderate shortness of breath with minimal exertion. She says that this has worsened over the last week since been out of jardiance. Has taken an extra 20mg  torsemide because her weight was rising. Has associated fatigue, rare chest pain, pedal edema (worsening) and gradual weight gain along with this. Denies palpitations, cough or change in sleeping pattern. In general she takes an extra 20mg  torsemide 1-2 times/ week if she sees her weight rising.   She's been out of jardiance for ~ 1 week due to cost. Pharmacy is working on patient assistance for her. Symptoms have worsened since she's been out of the jardiance.   ROS: All systems negative except as listed in HPI, PMH and Problem List.  SH:  Social History   Socioeconomic History   Marital status: Divorced    Spouse name: Not on file   Number of children: 5   Years of education: Not on file   Highest education level: Associate degree: academic program  Occupational History   Occupation: Retired  Tobacco Use   Smoking status: Every Day    Current packs/day: 2.00    Average packs/day: 2.0 packs/day for 50.0 years (100.0 ttl pk-yrs)    Types: Cigarettes   Smokeless tobacco: Never  Vaping Use   Vaping  status: Former   Substances: Nicotine  Substance and Sexual Activity   Alcohol use: Yes   Drug use: Not Currently   Sexual activity: Not Currently  Other Topics Concern   Not on file  Social History Narrative   Lives locally by herself.  Sedentary.   Social Drivers of Manufacturing engineer Strain: Low Risk  (03/24/2023)   Overall Financial Resource Strain (CARDIA)    Difficulty of Paying Living Expenses: Not hard at all  Food Insecurity: No Food Insecurity (04/17/2023)   Hunger Vital Sign    Worried About Running Out of Food in the Last Year: Never true    Ran Out of Food in the Last Year: Never true  Transportation Needs: No Transportation Needs (04/17/2023)   PRAPARE - Administrator, Civil Service (Medical): No    Lack of Transportation (Non-Medical): No  Physical Activity: Not on file  Stress: Not on file  Social Connections: Not on file  Intimate Partner Violence: Not At Risk (04/17/2023)   Humiliation, Afraid, Rape, and Kick questionnaire    Fear of Current or Ex-Partner: No    Emotionally Abused: No    Physically Abused: No    Sexually Abused: No    FH:  Family History  Problem Relation Age of Onset   Uterine cancer Mother    Stroke Mother    Aneurysm Mother    Heart attack Father    Coronary artery disease Father    Leukemia Father    Depression Father    Diabetes Father    Hyperlipidemia Father    Hypertension Father    Colon polyps Sister    Obesity Sister    Thyroid disease Sister    COPD Sister    Lung cancer Sister    Breast cancer Maternal Aunt    Breast cancer Maternal Aunt     Past Medical History:  Diagnosis Date   Allergy    Anxiety    Chronic heart failure with preserved ejection fraction (HFpEF) (HCC)    a. 02/2023 Echo: EF 60-65%, no rwma, GrII DD, nl RV size/fxn, mild LAE, mild Misty (mean grad ), mod MAC, mild AS (mean grad 9.1mmHg).   CKD (chronic kidney disease), stage III (HCC)    COPD (chronic obstructive pulmonary disease) (HCC)    Depression    Diabetes mellitus without complication (HCC)    Hyperlipidemia    Hypertension    Hypothyroidism    Interstitial lung disease (HCC)    PAD (peripheral artery disease) (HCC)    a. 07/2017 L subclavian stenosis s/p stenting.   Psoriasis     Substance abuse (HCC)    Tobacco abuse     Current Outpatient Medications  Medication Sig Dispense Refill   ACCU-CHEK GUIDE TEST test strip      Accu-Chek Softclix Lancets lancets SMARTSIG:Lancet Topical     acetaminophen (TYLENOL) 500 MG tablet Take 1,000 mg by mouth every 6 (six) hours as needed for moderate pain or headache.     albuterol (VENTOLIN HFA) 108 (90 Base) MCG/ACT inhaler Inhale 2 puffs into the lungs every 6 (six) hours as needed for wheezing or shortness of breath. 1 each 3   ALPRAZolam (XANAX) 0.5 MG tablet Take 0.5 mg by mouth 2 (two) times daily as needed for anxiety.      aspirin 81 MG tablet Take 81 mg by mouth at bedtime.      atorvastatin (LIPITOR) 40 MG tablet Take 1 tablet (40 mg total)  by mouth at bedtime. 30 tablet 0   bisoprolol (ZEBETA) 5 MG tablet Take 1 tablet (5 mg total) by mouth daily. 30 tablet 1   BREZTRI AEROSPHERE 160-9-4.8 MCG/ACT AERO Inhale 2 puffs into the lungs 2 (two) times daily.     clopidogrel (PLAVIX) 75 MG tablet Take 1 tablet (75 mg total) by mouth daily with breakfast. 30 tablet 3   empagliflozin (JARDIANCE) 10 MG TABS tablet Take 1 tablet (10 mg total) by mouth daily. 30 tablet 1   ezetimibe (ZETIA) 10 MG tablet Take 1 tablet (10 mg total) by mouth daily. 30 tablet 1   ferrous sulfate 325 (65 FE) MG tablet Take 1 tablet (325 mg total) by mouth daily with breakfast. 30 tablet 3   gabapentin (NEURONTIN) 100 MG capsule Take 200 mg by mouth at bedtime.     guaiFENesin-dextromethorphan (ROBITUSSIN DM) 100-10 MG/5ML syrup Take 10 mLs by mouth every 4 (four) hours as needed for cough. 118 mL 0   levothyroxine (SYNTHROID, LEVOTHROID) 125 MCG tablet Take 125 mcg by mouth daily.      ondansetron (ZOFRAN) 4 MG tablet Take 4 mg by mouth every 4 (four) hours as needed for nausea or vomiting.     OXYGEN Inhale 2 L/L into the lungs at bedtime.     pantoprazole (PROTONIX) 40 MG tablet Take 40 mg by mouth 2 (two) times daily.     spironolactone (ALDACTONE)  25 MG tablet Take 1 tablet (25 mg total) by mouth daily. 30 tablet 1   temazepam (RESTORIL) 15 MG capsule Take 15 mg by mouth at bedtime as needed for sleep.     torsemide (DEMADEX) 20 MG tablet Take 20 MG daily; Take an additional 20 MG if needed for weight gain or swelling. (Patient taking differently: 40 mg. Take 20 MG daily; Take an additional 20 MG if needed for weight gain or swelling.) 60 tablet 5   traZODone (DESYREL) 100 MG tablet Take 100 mg by mouth daily as needed for sleep.     No current facility-administered medications for this visit.   Vitals:   05/07/23 1053  BP: (!) 141/61  Pulse: 60  SpO2: 95%  Weight: 168 lb (76.2 kg)  Height: 5\' 4"  (1.626 m)   Wt Readings from Last 3 Encounters:  05/07/23 168 lb (76.2 kg)  04/30/23 165 lb (74.8 kg)  04/13/23 167 lb (75.8 kg)   Lab Results  Component Value Date   CREATININE 1.77 (H) 04/30/2023   CREATININE 1.52 (H) 04/13/2023   CREATININE 1.99 (H) 04/06/2023    PHYSICAL EXAM:  General:  Well appearing. No resp difficulty HEENT: normal Neck: supple. JVP flat. Carotids 2+ bilaterally; no bruits. No lymphadenopathy or thryomegaly appreciated. Cor: PMI normal. Regular rate & rhythm. No rubs, gallops or murmurs. Lungs: clear Abdomen: soft, nontender, nondistended. No hepatosplenomegaly. No bruits or masses.  Extremities: no cyanosis, clubbing, rash, 1+ pitting edema bilateral lower legs Neuro: alert & oriented x3, cranial nerves grossly intact. Moves all 4 extremities w/o difficulty. Affect pleasant.   ECG: not done  ReDs reading: 44 %, abnormal (see plan below)   ASSESSMENT & PLAN:  1: Ischemic heart failure with preserved ejection fraction- - cath with stent 03/26/23 - NYHA class III - moderately fluid overloaded with worsening symptoms, home weight gain and elevated ReDs reading - weighing daily & home weight has risen; understands to call for overnight weight gain > 2 pounds or a weekly weight gain  >5 pounds -  weight stable from  last visit here 1 month ago - ReDs 44%-> discussed using furoscix or increasing torsemide and she prefers to increase the torsemide at this time; advised to double her torsemide for 2-3 days but if symptoms don't improve and weight starts to decline, to call back and we can do furoscix at that time - Echo 03/09/23: EF 60-65% with Grade II DD, mild LAE, mild AS - continue bisoprolol 5mg  daily - resume jardiance 10mg  daily (samples provided today) - continue spironolactone 25mg  daily - continue torsemide 20mg  daily - seeing PCP next week for already scheduled appointment - put compression socks on daily - keeping daily fluid intake close to 64 ounces with occasionally going over - not adding salt and has been looking at food labels - BNP 03/23/23 was 554.0  2: HTN- - BP 141/61 but currently fluid overloaded; increasing torsemide per above - saw PCP Misty Adams) 11/24 - BMP 05/05/23 showed sodium 143, potassium 3.9, creatinine 1.7, GFR 3 - was previously taken off potassium as she has had some hyperkalemia in the past  3: CAD- - saw cardiology (Misty Adams) 12/24 - continue clopidogrel 75mg  daily - continue ASA 81mg  daily - RHC/ LHC 03/26/23:   Prox RCA lesion is 45% stenosed.   CULPRIT LESION: Mid RCA lesion is 90% stenosed.   A drug-eluting stent was successfully placed using a STENT ONYX FRONTIER 3.0X22.   Post intervention, there is a 0% residual stenosis.   -------------------------------------   Hemodynamic findings consistent with mild pulmonary hypertension.  (PAP 49/24-33 mmHg, PCWP 24 mmHg transpulmonary Gradient 9 mmHg); LVEDP 16 mmHg.   Compensated CHF: LVEDP 16 mmHg, PCWP 24 mmHg.   Recommend uninterrupted dual antiplatelet therapy with Aspirin 81mg  daily and Clopidogrel 75mg  daily for a minimum of 12 months (ACS-Class I recommendation).  4: COPD- - continue albuterol inhaler PRN - continue breztri BID - saw pulmonology Misty Adams) 12/24 - wearing oxygen at 2L  at bedtime only  5: Anemia- - continue ferrous sulfate 325mg  daily - saw hematology Misty Adams) 01/25 - Hg 05/05/23 was 9.4  6: Hyperlipidemia- - continue atorvastatin 40mg  daily - continue ezetimibe 10mg  daily - LDL 05/05/23 was 37 - lipo (a) 03/27/23 was 54.2  7: Tobacco use- - smoking 1.5 ppd of cigarettes - no desire to quit - discussed health coach, again, but she is not interested at this time   Return in 1 month, sooner if needed.

## 2023-05-07 ENCOUNTER — Ambulatory Visit: Payer: Medicare Other | Attending: Family | Admitting: Family

## 2023-05-07 ENCOUNTER — Other Ambulatory Visit: Payer: Self-pay

## 2023-05-07 ENCOUNTER — Encounter: Payer: Self-pay | Admitting: Family

## 2023-05-07 VITALS — BP 141/61 | HR 60 | Ht 64.0 in | Wt 168.0 lb

## 2023-05-07 DIAGNOSIS — D649 Anemia, unspecified: Secondary | ICD-10-CM | POA: Diagnosis not present

## 2023-05-07 DIAGNOSIS — Z79899 Other long term (current) drug therapy: Secondary | ICD-10-CM | POA: Insufficient documentation

## 2023-05-07 DIAGNOSIS — J449 Chronic obstructive pulmonary disease, unspecified: Secondary | ICD-10-CM | POA: Insufficient documentation

## 2023-05-07 DIAGNOSIS — K219 Gastro-esophageal reflux disease without esophagitis: Secondary | ICD-10-CM | POA: Insufficient documentation

## 2023-05-07 DIAGNOSIS — E1122 Type 2 diabetes mellitus with diabetic chronic kidney disease: Secondary | ICD-10-CM | POA: Diagnosis not present

## 2023-05-07 DIAGNOSIS — E1151 Type 2 diabetes mellitus with diabetic peripheral angiopathy without gangrene: Secondary | ICD-10-CM | POA: Diagnosis not present

## 2023-05-07 DIAGNOSIS — F17210 Nicotine dependence, cigarettes, uncomplicated: Secondary | ICD-10-CM | POA: Insufficient documentation

## 2023-05-07 DIAGNOSIS — N184 Chronic kidney disease, stage 4 (severe): Secondary | ICD-10-CM

## 2023-05-07 DIAGNOSIS — Z7982 Long term (current) use of aspirin: Secondary | ICD-10-CM | POA: Insufficient documentation

## 2023-05-07 DIAGNOSIS — I130 Hypertensive heart and chronic kidney disease with heart failure and stage 1 through stage 4 chronic kidney disease, or unspecified chronic kidney disease: Secondary | ICD-10-CM | POA: Insufficient documentation

## 2023-05-07 DIAGNOSIS — I2510 Atherosclerotic heart disease of native coronary artery without angina pectoris: Secondary | ICD-10-CM | POA: Diagnosis not present

## 2023-05-07 DIAGNOSIS — E039 Hypothyroidism, unspecified: Secondary | ICD-10-CM | POA: Diagnosis not present

## 2023-05-07 DIAGNOSIS — D631 Anemia in chronic kidney disease: Secondary | ICD-10-CM

## 2023-05-07 DIAGNOSIS — Z7902 Long term (current) use of antithrombotics/antiplatelets: Secondary | ICD-10-CM | POA: Diagnosis not present

## 2023-05-07 DIAGNOSIS — I10 Essential (primary) hypertension: Secondary | ICD-10-CM

## 2023-05-07 DIAGNOSIS — E785 Hyperlipidemia, unspecified: Secondary | ICD-10-CM | POA: Insufficient documentation

## 2023-05-07 DIAGNOSIS — Z9981 Dependence on supplemental oxygen: Secondary | ICD-10-CM | POA: Diagnosis not present

## 2023-05-07 DIAGNOSIS — I5032 Chronic diastolic (congestive) heart failure: Secondary | ICD-10-CM | POA: Diagnosis not present

## 2023-05-07 DIAGNOSIS — E782 Mixed hyperlipidemia: Secondary | ICD-10-CM

## 2023-05-07 DIAGNOSIS — I272 Pulmonary hypertension, unspecified: Secondary | ICD-10-CM | POA: Insufficient documentation

## 2023-05-07 DIAGNOSIS — Z955 Presence of coronary angioplasty implant and graft: Secondary | ICD-10-CM | POA: Diagnosis not present

## 2023-05-07 DIAGNOSIS — Z720 Tobacco use: Secondary | ICD-10-CM

## 2023-05-07 NOTE — Progress Notes (Signed)
   05/07/2023  Patient ID: Misty Adams, female   DOB: 10-02-1955, 68 y.o.   MRN: [id number]   Outreach attempt today was successful.   Reason for referral: Medication assistance for Breztri and Jardiance   Referral source: Care Management Referral medication(s): Misty Adams and Misty Adams; other inhalers Current insurance: UHC [place]    Objective: Allergies  Allergen Reactions   Hydrocodone-Acetaminophen Hives   Vicodin [Hydrocodone-Acetaminophen] Hives   Trelegy Ellipta [Fluticasone-Umeclidin-Vilant] Rash    Changed to Budeson-Glycopyrrol-Formoterol by provider    Medications Reviewed Today   Medications were not reviewed in this encounter     Assessment: Per discussion with Misty Adams, patient meets income eligibility for Misty Adams. She reported albuterol was $5 and there are no other medication needs. Verified address ([address] [place] [address]), insurance is with [facility] medicare).   Medication Review Findings:  Patient ran out of Jardiance last week and has about a two weeks supply of Breztri.   Medication Assistance Findings:  Extra Help:   []  Already receiving Full Extra Help  []  Already receiving Partial Extra Help  []  Eligible based on reported income and assets  [x]  Not Eligible based on reported income and assets  Patient Assistance Programs: Jardiance made by [employer] Income requirement met: [x]  Yes []  No []  Unknown Out-of-pocket prescription expenditure met:    []  Yes []  No  []  Unknown  [x]  Not applicable Jardiance 10 mg daily         2)  Breztri made by [employer] Income requirement met: [x]  Yes []  No  []  Unknown Out-of-pocket prescription expenditure met:   []  Yes []  No   []  Unknown [x]  Not applicable  Breztri 160-9-4.8 MCG/ACT two inhalations twice daily    Additional medication assistance options reviewed with patient as warranted:  No other options identified   Plan: Patient is to provide additional documents as necessary. Will reach  out to Misty Adams and Misty Kindred, NP for samples to bridge during application process. Misty Adams has already been contacted today, via secure chat, prior to appointment with provider for samples; provider stated they would try to have samples ready for appointment if available.   Follow up:  May need to follow-up with patient if no samples are available.    Thank you for allowing pharmacy to be a part of this patient's care.  Misty Adams, PharmD Clinical Pharmacist Cell: [phone]

## 2023-05-07 NOTE — Progress Notes (Signed)
REDS VEST READING= 44% CHEST RULER= 38 HEIGHT MARKER= B

## 2023-05-13 ENCOUNTER — Telehealth: Payer: Self-pay

## 2023-05-13 NOTE — Progress Notes (Signed)
   05/13/2023  Patient ID: Misty Adams, female   DOB: January 10, 1956, 68 y.o.   MRN: [id number]  Brief Summary  Reason for call:Per prior discussion with the patient, she had an insufficient supply of Jardiance and Breztri to last for the duration of the application process. Based on chart review and a discussion with Clarisa Kindred, the patient received a sample of Jardiance. However, I have not received communication from Dr. Reita Cliche office regarding the availability of additional samples. As a result, this pharmacist called the pulmonologist and spoke with the clinical staff. Instructions were given for [name] to call their clinic to "work something out" since they do not have samples available.   Plan:  I will leave a patient message to [name] to call Dr. Reita Cliche clinic. Will forward application to medication assistance team at this time.   Thank you for allowing pharmacy to be a part of this patient's care. Cephus Shelling, PharmD Clinical Pharmacist [facility] Cell: [phone]

## 2023-05-14 ENCOUNTER — Ambulatory Visit: Payer: Self-pay | Admitting: *Deleted

## 2023-05-14 NOTE — Patient Instructions (Signed)
Visit Information  Thank you for taking time to visit with me today. Please don't hesitate to contact me if I can be of assistance to you before our next scheduled telephone appointment.  Following are the goals we discussed today:  Continue following HF diet Continue daily weights  Please call the care guide team at [phone] if you need to cancel or reschedule your appointment.   Please call the Suicide and Crisis Lifeline: 988 call the [place] National Suicide Prevention Lifeline: 854-069-3651 or TTY: 903-667-2376 TTY 8788091425) to talk to a trained counselor call 1-800-273-TALK (toll free, 24 hour hotline) call 911 if you are experiencing a Mental Health or Behavioral Health Crisis or need someone to talk to.  Patient verbalizes understanding of instructions and care plan provided today and agrees to view in MyChart. Active MyChart status and patient understanding of how to access instructions and care plan via MyChart confirmed with patient.     The patient has been provided with contact information for the care management team and has been advised to call with any health related questions or concerns.   Rodney Langton, RN, MSN, CCM [facility], [facility] Health RN Care Coordinator Direct Dial: [phone] / Main [phone] Fax [phone] Email: [email or web address] Website: [email or web address]

## 2023-05-14 NOTE — Patient Outreach (Signed)
  Care Coordination   Follow Up Visit Note   05/14/2023 Name: Misty Adams MRN: [id number] DOB: 1955-06-19  Misty Adams is a 68 y.o. year old female who sees Misty Penton, MD for primary care. I spoke with  Misty Adams by phone today.  What matters to the patients health and wellness today?  PCP and heart failure visits done since last outreach, no change in current plan of care.  Denies any urgent concerns, encouraged to contact this care manager with questions.      Goals Addressed             This Visit's Progress    COMPLETED: Management of chronic medical conditions   On track    Interventions Today    Flowsheet Row Most Recent Value  Chronic Disease   Chronic disease during today's visit Congestive Heart Failure (CHF), Chronic Obstructive Pulmonary Disease (COPD)  General Interventions   General Interventions Discussed/Reviewed General Interventions Reviewed, Doctor Visits  Doctor Visits Discussed/Reviewed Doctor Visits Reviewed, PCP, Specialist  [Reviewed upcoming: GI 1/30, HF 2/7, Pulmonary 4/23, Oncology 4/9, cardiology 3/26, and PCP 5/21]  PCP/Specialist Visits Compliance with follow-up visit  Exercise Interventions   Exercise Discussed/Reviewed Weight Managment  Weight Management Weight maintenance  [Report weight stable, fluctuating between 1-2 pounds, denies swelling]  Education Interventions   Education Provided Provided Education  Provided Verbal Education On Medication, When to see the doctor, Nutrition  [Report she has received samples for [place] and Breztri, paperwork completed for medication assistance.]  Nutrition Interventions   Nutrition Discussed/Reviewed Nutrition Reviewed, Decreasing salt, Adding fruits and vegetables  [Discussed HF diet, report adherence to 64 oz daily]              SDOH assessments and interventions completed:  No     Care Coordination Interventions:  Yes, provided   Follow up plan: No further  intervention required. Patient now has UHC Medicare (VBCI covers HTA and [employer] with [place]). Patient still advised to contact this RNCM with any challenges.   Encounter Outcome:  Patient Visit Completed   Misty Langton, RN, MSN, CCM [facility]  [facility], [facility] RN Care Coordinator Direct Dial: [phone] / Main [phone] Fax [phone] Email: [email or web address]: [email or web address]

## 2023-05-15 ENCOUNTER — Inpatient Hospital Stay: Payer: Medicare Other

## 2023-05-15 VITALS — BP 142/52 | HR 59 | Temp 98.4°F | Resp 18

## 2023-05-15 DIAGNOSIS — D509 Iron deficiency anemia, unspecified: Secondary | ICD-10-CM | POA: Diagnosis not present

## 2023-05-15 DIAGNOSIS — D649 Anemia, unspecified: Secondary | ICD-10-CM

## 2023-05-15 MED ORDER — IRON SUCROSE 20 MG/ML IV SOLN
200.0000 mg | Freq: Once | INTRAVENOUS | Status: AC
  Administered 2023-05-15: 200 mg via INTRAVENOUS
  Filled 2023-05-15: qty 10

## 2023-05-15 MED ORDER — SODIUM CHLORIDE 0.9% FLUSH
10.0000 mL | Freq: Once | INTRAVENOUS | Status: AC | PRN
  Administered 2023-05-15: 10 mL
  Filled 2023-05-15: qty 10

## 2023-05-15 NOTE — Patient Instructions (Signed)

## 2023-05-15 NOTE — Progress Notes (Signed)
Declined post-observation. Aware of risks. Vitals stable at discharge.

## 2023-05-18 ENCOUNTER — Telehealth: Payer: Self-pay | Admitting: Pharmacy Technician

## 2023-05-18 ENCOUNTER — Encounter: Payer: Self-pay | Admitting: Family

## 2023-05-18 ENCOUNTER — Other Ambulatory Visit: Payer: Self-pay | Admitting: Family

## 2023-05-18 DIAGNOSIS — Z5986 Financial insecurity: Secondary | ICD-10-CM

## 2023-05-18 MED ORDER — TORSEMIDE 20 MG PO TABS: ORAL_TABLET | ORAL | 5 refills | Status: DC

## 2023-05-18 NOTE — Progress Notes (Signed)
Pharmacy Medication Assistance Program Note    05/18/2023  Patient ID: Misty Adams, female   DOB: 07-18-1955, 68 y.o.   MRN: [id number]     05/18/2023  Outreach Medication One  Initial Outreach Date (Medication One) 05/14/2023  Manufacturer Medication One [employer]  [employer] Drugs Bretztri  Type of Radiographer, therapeutic Assistance  Date Application Sent to Patient 05/20/2023  Application Items Requested Application;Proof of Income;Other  Date Application Sent to Prescriber 05/20/2023  Name of Prescriber Ned Clines        05/18/2023  Outreach Medication Two  Initial Outreach Date (Medication Two) 05/14/2023  Manufacturer Medication Two [employer]  [employer] Drugs Jardiance  Dose of Jardiance 10mg   Type of Radiographer, therapeutic Assistance  Date Application Sent to Patient 05/20/2023  Application Items Requested Application;Proof of Income;Other  Date Application Sent to Prescriber 05/20/2023  Name of Prescriber [facility]   Kristopher Glee [facility] Health  Office: [phone] Fax: [phone] Email: [email or web address]

## 2023-05-25 ENCOUNTER — Telehealth: Payer: Self-pay

## 2023-05-25 ENCOUNTER — Encounter: Payer: Self-pay | Admitting: Family

## 2023-05-25 ENCOUNTER — Other Ambulatory Visit: Payer: Self-pay | Admitting: Family

## 2023-05-25 NOTE — Telephone Encounter (Signed)
Received application for medication assistance for jardiance from the [facility] Pharmacy Assistance Team. Provider signed and application faxed successfully to [facility], cPhT per request.

## 2023-05-28 ENCOUNTER — Telehealth: Payer: Self-pay | Admitting: Family

## 2023-05-28 NOTE — Telephone Encounter (Signed)
 Pt confirmed appt for 05/29/23

## 2023-05-29 ENCOUNTER — Ambulatory Visit: Payer: Medicare Other | Attending: Family | Admitting: Family

## 2023-05-29 ENCOUNTER — Encounter: Payer: Self-pay | Admitting: Family

## 2023-05-29 ENCOUNTER — Inpatient Hospital Stay: Payer: Medicare Other | Attending: Internal Medicine

## 2023-05-29 VITALS — BP 129/52 | HR 67 | Wt 169.0 lb

## 2023-05-29 VITALS — BP 136/58 | HR 72 | Temp 98.4°F | Resp 18

## 2023-05-29 DIAGNOSIS — N183 Chronic kidney disease, stage 3 unspecified: Secondary | ICD-10-CM | POA: Insufficient documentation

## 2023-05-29 DIAGNOSIS — Z7902 Long term (current) use of antithrombotics/antiplatelets: Secondary | ICD-10-CM | POA: Insufficient documentation

## 2023-05-29 DIAGNOSIS — I272 Pulmonary hypertension, unspecified: Secondary | ICD-10-CM | POA: Insufficient documentation

## 2023-05-29 DIAGNOSIS — N184 Chronic kidney disease, stage 4 (severe): Secondary | ICD-10-CM

## 2023-05-29 DIAGNOSIS — I2510 Atherosclerotic heart disease of native coronary artery without angina pectoris: Secondary | ICD-10-CM

## 2023-05-29 DIAGNOSIS — E1122 Type 2 diabetes mellitus with diabetic chronic kidney disease: Secondary | ICD-10-CM | POA: Insufficient documentation

## 2023-05-29 DIAGNOSIS — E782 Mixed hyperlipidemia: Secondary | ICD-10-CM

## 2023-05-29 DIAGNOSIS — E1151 Type 2 diabetes mellitus with diabetic peripheral angiopathy without gangrene: Secondary | ICD-10-CM | POA: Insufficient documentation

## 2023-05-29 DIAGNOSIS — K219 Gastro-esophageal reflux disease without esophagitis: Secondary | ICD-10-CM | POA: Diagnosis not present

## 2023-05-29 DIAGNOSIS — I10 Essential (primary) hypertension: Secondary | ICD-10-CM

## 2023-05-29 DIAGNOSIS — Z720 Tobacco use: Secondary | ICD-10-CM

## 2023-05-29 DIAGNOSIS — I5032 Chronic diastolic (congestive) heart failure: Secondary | ICD-10-CM

## 2023-05-29 DIAGNOSIS — Z7982 Long term (current) use of aspirin: Secondary | ICD-10-CM | POA: Diagnosis not present

## 2023-05-29 DIAGNOSIS — D631 Anemia in chronic kidney disease: Secondary | ICD-10-CM

## 2023-05-29 DIAGNOSIS — Z955 Presence of coronary angioplasty implant and graft: Secondary | ICD-10-CM | POA: Insufficient documentation

## 2023-05-29 DIAGNOSIS — D649 Anemia, unspecified: Secondary | ICD-10-CM

## 2023-05-29 DIAGNOSIS — I130 Hypertensive heart and chronic kidney disease with heart failure and stage 1 through stage 4 chronic kidney disease, or unspecified chronic kidney disease: Secondary | ICD-10-CM | POA: Insufficient documentation

## 2023-05-29 DIAGNOSIS — D509 Iron deficiency anemia, unspecified: Secondary | ICD-10-CM | POA: Insufficient documentation

## 2023-05-29 DIAGNOSIS — E039 Hypothyroidism, unspecified: Secondary | ICD-10-CM | POA: Insufficient documentation

## 2023-05-29 DIAGNOSIS — E785 Hyperlipidemia, unspecified: Secondary | ICD-10-CM | POA: Diagnosis not present

## 2023-05-29 DIAGNOSIS — J449 Chronic obstructive pulmonary disease, unspecified: Secondary | ICD-10-CM | POA: Diagnosis not present

## 2023-05-29 MED ORDER — IRON SUCROSE 20 MG/ML IV SOLN
200.0000 mg | Freq: Once | INTRAVENOUS | Status: AC
  Administered 2023-05-29: 200 mg via INTRAVENOUS

## 2023-05-29 MED ORDER — SODIUM CHLORIDE 0.9% FLUSH
10.0000 mL | Freq: Once | INTRAVENOUS | Status: AC | PRN
  Administered 2023-05-29: 10 mL
  Filled 2023-05-29: qty 10

## 2023-05-29 NOTE — Patient Instructions (Signed)
 There has been no changes to your medications.  Go DOWN to LOWER LEVEL (LL) to have your blood work completed inside of [employer] office.  We will only call you if the results are abnormal or if the provider would like to make medication changes.  Your physician recommends that you schedule a follow-up appointment in: 1 month.  If you have any questions or concerns before your next appointment please send us  a message through [place] or call our office at [phone].

## 2023-05-29 NOTE — Progress Notes (Signed)
 Advanced Heart Failure Clinic Note    PCP: Cleotilde Oneil FALCON, MD (last seen 01/25) Cardiologist: [name], MD/ Loistine Sober, NP (last seen 12/24)  Chief Complaint: fatigue  HPI:  Misty Adams is a 68 y/o female with a history of COPD, HTN, hyperlipidemia, mild pHTN, GERD, subclavian artery stenosis (stented 07/2017), CKD, pulmonary HTN, CAD, iron  deficiency anemia, hypothyroidism, PVD, tobacco use and chronic heart failure.   Admitted 07/14/22 with E. coli UTI, severe sepsis, and AKI. High-sensitivity troponin rose to 56. She did not undergo cardiology evaluation. In August 2024 CT chest performed secondary to weight loss showed aortic atherosclerosis, unchanged 4 mm right apical pulmonary nodule, enlarged pulmonary trunk consistent with PAH, left adrenal adenoma.   Admitted 03/08/23 due to waking up short of breath & orthopnea after returning from a cruise. BNP 710. CXR showed heart failure. Initially placed on bipap and weaned down to nasal cannula. IV lasix  given. Hypokalemia corrected. Echocardiogram performed on 03/08/2023 showed ejection fraction 60 to 65% with grade 2 diastolic dysfunction. Symptoms improved.   Admitted 03/23/23 due to worsening of shortness of breath, leg swelling and significant weight gain. Developed hypoxia and placed on 5L nasal cannula. BNP elevated and given IV lasix . Cardiology consulted. Chest x-ray showed evidence of pulmonary edema with pleural effusion. CT shows signs consistent with pulmonary hypertension. Oxygen  weaned down to 2L. L and R heart cath 12/5 with stent placed to RCA. Hypokalemia corrected.   Echo 03/09/23: EF 60-65% with Grade II DD, mild LAE, mild AS  RHC/ LHC 03/26/23:   Prox RCA lesion is 45% stenosed.   CULPRIT LESION: Mid RCA lesion is 90% stenosed.   A drug-eluting stent was successfully placed using a STENT ONYX FRONTIER 3.0X22.   Post intervention, there is a 0% residual stenosis.   -------------------------------------    Hemodynamic findings consistent with mild pulmonary hypertension.  (PAP 49/24-33 mmHg, PCWP 24 mmHg transpulmonary Gradient 9 mmHg); LVEDP 16 mmHg.   Compensated CHF: LVEDP 16 mmHg, PCWP 24 mmHg.   Recommend uninterrupted dual antiplatelet therapy with Aspirin  81mg  daily and Clopidogrel  75mg  daily for a minimum of 12 months (ACS-Class I recommendation).  She presents, with sister, for a HF follow-up visit with a chief complaint of moderate fatigue. Has associated shortness of breath, occasional palpitations, abdominal distention, pedal edema and occasional dizziness along with this. Denies chest pain, cough or difficulty sleeping.   Gained 2 pounds overnight and took 2 torsemide  this morning. (Normally takes 1)  At last visit, torsemide  was doubled for 2-3 days & samples were provided for jardiance .   ROS: All systems negative except as listed in HPI, PMH and Problem List.  SH:  Social History   Socioeconomic History   Marital status: Divorced    Spouse name: Not on file   Number of children: 5   Years of education: Not on file   Highest education level: Associate degree: academic program  Occupational History   Occupation: Retired  Tobacco Use   Smoking status: Every Day    Current packs/day: 2.00    Average packs/day: 2.0 packs/day for 50.0 years (100.0 ttl pk-yrs)    Types: Cigarettes   Smokeless tobacco: Never  Vaping Use   Vaping status: Former   Substances: Nicotine  Substance and Sexual Activity   Alcohol use: Yes   Drug use: Not Currently   Sexual activity: Not Currently  Other Topics Concern   Not on file  Social History Narrative   Lives locally by herself.  Sedentary.   Social Drivers of Corporate Investment Banker Strain: High Risk (05/07/2023)   Received from [facility]   Overall Financial Resource Strain (CARDIA)    Difficulty of Paying Living Expenses: Hard  Food Insecurity: Food Insecurity Present (05/07/2023)   Received from [facility]   Hunger Vital Sign    Worried About Running Out of Food in the Last Year: Sometimes true    Ran Out of Food in the Last Year: Sometimes true  Transportation Needs: No Transportation Needs (05/07/2023)   Received from [facility] - Transportation    In the past 12 months, has lack of transportation kept you from medical appointments or from getting medications?: No    Lack of Transportation (Non-Medical): No  Physical Activity: Not on file  Stress: Not on file  Social Connections: Not on file  Intimate Partner Violence: Not At Risk (04/17/2023)   Humiliation, Afraid, Rape, and Kick questionnaire    Fear of Current or Ex-Partner: No    Emotionally Abused: No    Physically Abused: No    Sexually Abused: No    FH:  Family History  Problem Relation Age of Onset   Uterine cancer Mother    Stroke Mother    Aneurysm Mother    Heart attack Father    Coronary artery disease Father    Leukemia Father    Depression Father    Diabetes Father    Hyperlipidemia Father    Hypertension Father    Colon polyps Sister    Obesity Sister    Thyroid  disease Sister    COPD Sister    Lung cancer Sister    Breast cancer Maternal Aunt    Breast cancer Maternal Aunt     Past Medical History:  Diagnosis Date   Allergy    Anxiety    Chronic heart failure with preserved ejection fraction (HFpEF) (HCC)    a. 02/2023 Echo: EF 60-65%, no rwma, GrII DD, nl RV size/fxn, mild LAE, mild Misty (mean grad ), mod MAC, mild AS (mean grad 9.70mmHg).   CKD (chronic kidney disease), stage III (HCC)    COPD (chronic obstructive pulmonary disease) (HCC)    Depression    Diabetes mellitus without complication (HCC)    Hyperlipidemia    Hypertension    Hypothyroidism    Interstitial lung disease (HCC)    PAD (peripheral artery disease) (HCC)    a. 07/2017 L subclavian stenosis s/p stenting.   Psoriasis    Substance abuse (HCC)    Tobacco abuse      Current Outpatient Medications  Medication Sig Dispense Refill   ACCU-CHEK GUIDE TEST test strip      Accu-Chek Softclix Lancets lancets SMARTSIG:Lancet Topical     acetaminophen  (TYLENOL ) 500 MG tablet Take 1,000 mg by mouth every 6 (six) hours as needed for moderate pain or headache.     albuterol  (VENTOLIN  HFA) 108 (90 Base) MCG/ACT inhaler Inhale 2 puffs into the lungs every 6 (six) hours as needed for wheezing or shortness of breath. 1 each 3   ALPRAZolam  (XANAX ) 0.5 MG tablet Take 0.5 mg by mouth 2 (two) times daily as needed for anxiety.      aspirin  81 MG tablet Take 81 mg by mouth at bedtime.      atorvastatin  (LIPITOR) 40 MG tablet Take 1 tablet (40 mg total) by mouth at bedtime. 30 tablet 0   bisoprolol  (ZEBETA ) 5 MG tablet  TAKE 1 TABLET (5 MG TOTAL) BY MOUTH DAILY. 30 tablet 5   BREZTRI  AEROSPHERE 160-9-4.8 MCG/ACT AERO Inhale 2 puffs into the lungs 2 (two) times daily.     clopidogrel  (PLAVIX ) 75 MG tablet Take 1 tablet (75 mg total) by mouth daily with breakfast. 30 tablet 3   empagliflozin  (JARDIANCE ) 10 MG TABS tablet Take 1 tablet (10 mg total) by mouth daily. 30 tablet 1   ezetimibe  (ZETIA ) 10 MG tablet TAKE 1 TABLET BY MOUTH EVERY DAY 30 tablet 5   ferrous sulfate  325 (65 FE) MG tablet Take 1 tablet (325 mg total) by mouth daily with breakfast. 30 tablet 3   gabapentin  (NEURONTIN ) 100 MG capsule Take 200 mg by mouth at bedtime.     guaiFENesin -dextromethorphan  (ROBITUSSIN DM) 100-10 MG/5ML syrup Take 10 mLs by mouth every 4 (four) hours as needed for cough. 118 mL 0   levothyroxine  (SYNTHROID , LEVOTHROID) 125 MCG tablet Take 125 mcg by mouth daily.      ondansetron  (ZOFRAN ) 4 MG tablet Take 4 mg by mouth every 4 (four) hours as needed for nausea or vomiting.     OXYGEN  Inhale 2 L/L into the lungs at bedtime.     pantoprazole  (PROTONIX ) 40 MG tablet Take 40 mg by mouth 2 (two) times daily.     spironolactone  (ALDACTONE ) 25 MG tablet TAKE 1 TABLET (25 MG TOTAL) BY MOUTH  DAILY. 30 tablet 5   temazepam  (RESTORIL ) 15 MG capsule Take 15 mg by mouth at bedtime as needed for sleep.     torsemide  (DEMADEX ) 20 MG tablet 20mg  daily AND take an additional 20 MG if needed for weight gain or swelling. 60 tablet 5   traZODone  (DESYREL ) 100 MG tablet Take 100 mg by mouth daily as needed for sleep.     No current facility-administered medications for this visit.   Vitals:   05/29/23 1419 05/29/23 1421  BP:  (!) 129/52  Pulse: 71 67  SpO2: (!) 87% 96%  Weight: 169 lb (76.7 kg) 169 lb (76.7 kg)   Wt Readings from Last 3 Encounters:  05/29/23 169 lb (76.7 kg)  05/07/23 168 lb (76.2 kg)  04/30/23 165 lb (74.8 kg)   Lab Results  Component Value Date   CREATININE 1.77 (H) 04/30/2023   CREATININE 1.52 (H) 04/13/2023   CREATININE 1.99 (H) 04/06/2023    PHYSICAL EXAM:  General: Well appearing. No resp difficulty HEENT: normal Neck: supple, no JVD Cor: Regular rhythm, rate. No rubs, gallops or murmurs Lungs: clear Abdomen: soft, nontender, nondistended. Extremities: no cyanosis, clubbing, rash, 1+ pitting edema bilateral lower legs Neuro: alert & oriented X 3. Moves all 4 extremities w/o difficulty. Affect pleasant   ECG: not done   ASSESSMENT & PLAN:  1: Ischemic heart failure with preserved ejection fraction- - cath with stent 03/26/23 - NYHA class III - moderately fluid overloaded with worsening symptoms and home weight gain - weighing daily; reminded to call for overnight weight gain > 2 pounds or a weekly weight gain  >5 pounds - weight up 1 pound from last visit here 3 weeks ago - Echo 03/09/23: EF 60-65% with Grade II DD, mild LAE, mild AS - continue bisoprolol  5mg  daily - continue jardiance  10mg  daily  - continue spironolactone  25mg  daily - increase torsemide  to 40mg  every day - BMET today - put compression socks on daily - keeping daily fluid intake close to 64 ounces with occasionally going over - not adding salt and has been looking at food  labels - BNP 03/23/23 was 554.0  2: HTN- - BP 129/52 - saw PCP Ted) 01/25 - BMP 05/05/23 reviewed and showed sodium 143, potassium 3.9, creatinine 1.7, GFR 32 - BMET today - was previously taken off potassium as she has a hx of hyperkalemia  3: CAD- - saw cardiology [place]) 12/24 - continue clopidogrel  75mg  daily - continue ASA 81mg  daily - RHC/ LHC 03/26/23:   Prox RCA lesion is 45% stenosed.   CULPRIT LESION: Mid RCA lesion is 90% stenosed.   A drug-eluting stent was successfully placed using a STENT ONYX FRONTIER 3.0X22.   Post intervention, there is a 0% residual stenosis.   -------------------------------------   Hemodynamic findings consistent with mild pulmonary hypertension.  (PAP 49/24-33 mmHg, PCWP 24 mmHg transpulmonary Gradient 9 mmHg); LVEDP 16 mmHg.  4: COPD (managed by pulmonology)- - continue albuterol  inhaler PRN - continue breztri  BID - saw pulmonology Alica) 12/24 - wearing oxygen  at 2L at bedtime only  5: Anemia (managed by hematology)- - continue ferrous sulfate  325mg  daily - saw hematology Oza) 01/25 - Hg 05/05/23 was 9.4  6: Hyperlipidemia- - continue atorvastatin  40mg  daily - continue ezetimibe  10mg  daily - LDL 05/05/23 was 37 - lipo (a) 03/27/23 was 54.2  7: Tobacco use- - smoking 1.5 ppd of cigarettes - no desire to quit - discussed health coach, again, but she is not interested at this time   Return in 1 month, sooner if needed

## 2023-05-29 NOTE — Progress Notes (Signed)
 Patient declined to wait the 30 minutes for post iron infusion observation today. Tolerated infusion well. VSS.

## 2023-05-30 LAB — BASIC METABOLIC PANEL
BUN/Creatinine Ratio: 16 (ref 12–28)
BUN: 25 mg/dL (ref 8–27)
CO2: 27 mmol/L (ref 20–29)
Calcium: 9.5 mg/dL (ref 8.7–10.3)
Chloride: 98 mmol/L (ref 96–106)
Creatinine, Ser: 1.52 mg/dL — ABNORMAL HIGH (ref 0.57–1.00)
Glucose: 82 mg/dL (ref 70–99)
Potassium: 4.3 mmol/L (ref 3.5–5.2)
Sodium: 142 mmol/L (ref 134–144)
eGFR: 37 mL/min/{1.73_m2} — ABNORMAL LOW (ref >59)

## 2023-05-31 ENCOUNTER — Encounter: Payer: Self-pay | Admitting: Family

## 2023-06-10 ENCOUNTER — Telehealth: Payer: Self-pay | Admitting: Pharmacy Technician

## 2023-06-10 ENCOUNTER — Encounter: Payer: Self-pay | Admitting: Family

## 2023-06-10 DIAGNOSIS — Z5986 Financial insecurity: Secondary | ICD-10-CM

## 2023-06-10 NOTE — Progress Notes (Signed)
Pharmacy Medication Assistance Program Note    06/10/2023  Patient ID: Misty Adams, female   DOB: 01-02-1956, 68 y.o.   MRN: [id number]     05/18/2023 06/10/2023  Outreach Medication One  Initial Outreach Date (Medication One) 05/14/2023   Manufacturer Medication One [employer]   [employer] [facility] Bretztri   Type of Radiographer, therapeutic Assistance   Date Application Sent to Patient 05/20/2023   Application Items Requested Application;Proof of Income;Other   Date Application Sent to Prescriber 05/20/2023   Name of Prescriber Ned Clines   Date Application Received From Patient  06/04/2023  Application Items Received From Patient  Application;Proof of Income;Other  Date Application Received From Provider  05/22/2023  Date Application Submitted to Manufacturer  06/10/2023  Method Application Sent to Manufacturer  Fax       05/18/2023 06/10/2023  Outreach Medication Two  Initial Outreach Date (Medication Two) 05/14/2023   Manufacturer Medication Two [employer]   [employer] [facility] Jardiance   Dose of Jardiance 10mg    Type of Radiographer, therapeutic Assistance   Date Application Sent to Patient 05/20/2023   Application Items Requested Application;Proof of Income;Other   Date Application Sent to Prescriber 05/20/2023   Name of Prescriber Clarisa Kindred   Date Application Received From Patient  06/04/2023  Application Items Received From Patient  Proof of Income;Other  Date Application Received From Provider  05/25/2023  Method Application Sent to Manufacturer  Fax  Date Application Submitted to Manufacturer  06/10/2023   Submitted applications.  Pattricia Boss, CPhT [place]  Office: [phone] Fax: [phone] Email: [email or web address]

## 2023-06-11 ENCOUNTER — Other Ambulatory Visit: Payer: Self-pay

## 2023-06-11 MED ORDER — CLOPIDOGREL BISULFATE 75 MG PO TABS: 75.0000 mg | ORAL_TABLET | Freq: Every day | ORAL | 3 refills | Status: DC

## 2023-06-15 ENCOUNTER — Telehealth: Payer: Self-pay

## 2023-06-15 NOTE — Telephone Encounter (Signed)
 Clinic received approval notification from [facility] for assistance with Jardiance through 04/20/2024. Patient ID: [id number].

## 2023-06-23 ENCOUNTER — Telehealth: Payer: Self-pay | Admitting: Pharmacy Technician

## 2023-06-23 DIAGNOSIS — Z5986 Financial insecurity: Secondary | ICD-10-CM

## 2023-06-23 NOTE — Progress Notes (Signed)
 Pharmacy Medication Assistance Program Note    06/23/2023  Patient ID: Misty Adams, female   DOB: February 04, 1956, 68 y.o.   MRN: [id number]     05/18/2023 06/10/2023 06/23/2023  Outreach Medication One  Initial Outreach Date (Medication One) 05/14/2023    Manufacturer Medication One [employer]    [employer] [facility] Bretztri    Type of Radiographer, therapeutic Assistance    Date Application Sent to Patient 05/20/2023    Application Items Requested Application;Proof of Income;Other    Date Application Sent to Prescriber 05/20/2023    Name of Prescriber Ned Clines    Date Application Received From Patient  06/04/2023   Application Items Received From Patient  Application;Proof of Income;Other   Date Application Received From Provider  05/22/2023   Date Application Submitted to Manufacturer  06/10/2023   Method Application Sent to Manufacturer  Fax   Patient Assistance Determination   Approved  Approval Start Date   06/11/2023  Approval End Date   04/20/2024  Patient Notification Method   Telephone Call  Telephone Call Outcome   Successful       05/18/2023 06/10/2023 06/23/2023  Outreach Medication Two  Initial Outreach Date (Medication Two) 05/14/2023    Manufacturer Medication Two [employer]    [employer] [facility] Jardiance    Dose of Jardiance 10mg     Type of Radiographer, therapeutic Assistance    Date Application Sent to Patient 05/20/2023    Application Items Requested Application;Proof of Income;Other    Date Application Sent to Prescriber 05/20/2023    Name of Prescriber Clarisa Kindred    Date Application Received From Patient  06/04/2023   Application Items Received From Patient  Proof of Income;Other   Date Application Received From Provider  05/25/2023   Method Application Sent to Manufacturer  Fax   Date Application Submitted to Manufacturer  06/10/2023   Patient Assistance Determination   Approved  Approval Start Date   06/11/2023  Patient Notification Method    Telephone Call  Telephone Call Outcome   Successful     2 care coordination calls placed today to [employer] in regard to [facility] [address] application and to [employer] in regard to [place] application.  Per [employer] IVR, patient is APPROVED 06/11/23-04/20/24 for [employer]. Initial shipment as well as subsequent refill shipments will process automatically with delivery to the patient's home. Patient may call [employer] at any time to check on shipments by calling [phone].  Per [employer] IVR,  patient is APPROVED 06/11/23-04/20/24 for Jardiance. Initial shipment will process automatically and be delivered to the patient's home. Patient will have to call [employer] at [phone] to re order the medication when patient has around 10-14 days supply remaining.  Successful outreach to patient. HIPAA verified. Patient was informed of her approvals and refill procedures for each company. Patient verbalized understanding. Patient informs she has already received the initial supplies. Will route note to [facility] Pharmacist Misty Adams notifying her of this information.  Misty Adams, CPhT [name]  Office: [phone] Fax: [phone] Email: [email or web address]

## 2023-06-26 ENCOUNTER — Telehealth: Payer: Self-pay | Admitting: Family

## 2023-06-26 ENCOUNTER — Encounter: Payer: Self-pay | Admitting: Family

## 2023-06-26 NOTE — Telephone Encounter (Signed)
 Lvm to confirm appt for 06/29/23

## 2023-06-27 NOTE — Progress Notes (Unsigned)
 Advanced Heart Failure Clinic Note    PCP: Danella Penton, MD (last seen 01/25) Cardiologist: Lorine Bears, MD/ Carlos Levering, NP (last seen 12/24)  Chief Complaint: shortness of breath  HPI:  Misty Adams is a 68 y/o female with a history of COPD, HTN, hyperlipidemia, mild pHTN, GERD, subclavian artery stenosis (stented 07/2017), CKD, pulmonary HTN, CAD, iron deficiency anemia, hypothyroidism, PVD, tobacco use and chronic heart failure.   Admitted 07/14/22 with E. coli UTI, severe sepsis, and AKI. High-sensitivity troponin rose to 56. She did not undergo cardiology evaluation. In August 2024 CT chest performed secondary to weight loss showed aortic atherosclerosis, unchanged 4 mm right apical pulmonary nodule, enlarged pulmonary trunk consistent with PAH, left adrenal adenoma.   Admitted 03/08/23 due to waking up short of breath & orthopnea after returning from a cruise. BNP 710. CXR showed heart failure. Initially placed on bipap and weaned down to nasal cannula. IV lasix given. Hypokalemia corrected. Echocardiogram performed on 03/08/2023 showed ejection fraction 60 to 65% with grade 2 diastolic dysfunction. Symptoms improved.   Admitted 03/23/23 due to worsening of shortness of breath, leg swelling and significant weight gain. Developed hypoxia and placed on 5L nasal cannula. BNP elevated and given IV lasix. Cardiology consulted. Chest x-ray showed evidence of pulmonary edema with pleural effusion. CT shows signs consistent with pulmonary hypertension. Oxygen weaned down to 2L. L and R heart cath 12/5 with stent placed to RCA. Hypokalemia corrected.   Echo 03/09/23: EF 60-65% with Grade II DD, mild LAE, mild AS  RHC/ LHC 03/26/23:   Prox RCA lesion is 45% stenosed.   CULPRIT LESION: Mid RCA lesion is 90% stenosed.   A drug-eluting stent was successfully placed using a STENT ONYX FRONTIER 3.0X22.   Post intervention, there is a 0% residual stenosis.    -------------------------------------   Hemodynamic findings consistent with mild pulmonary hypertension.  (PAP 49/24-33 mmHg, PCWP 24 mmHg transpulmonary Gradient 9 mmHg); LVEDP 16 mmHg.   Compensated CHF: LVEDP 16 mmHg, PCWP 24 mmHg.   Recommend uninterrupted dual antiplatelet therapy with Aspirin 81mg  daily and Clopidogrel 75mg  daily for a minimum of 12 months (ACS-Class I recommendation).  She presents, with sister, for a HF follow-up visit with a chief complaint of moderate shortness of breath with little exertion. She says that she feels like it's a little better since having her torsemide increased. Has associated dizziness, productive cough, pedal edema (improving) and a dry mouth along with this. Denies chest pain, palpitations, abdominal distention or difficulty sleeping.   She has been taking 2 torsemide BID for the last few days and feels like her SOB has improved some and her weight has declined 4 pounds.   Using oxygen at 2L at bedtime and PRN during the day. She doesn't feel like she's been wearing it any more than she usually does. No change in dietary habits, eats out often but nothing different. May be drinking more fluids than previously because she has such a dry mouth.   Continues to smoke 1.5 ppd but is now considering decreasing amount because she's so tired of feeling so bad.   ROS: All systems negative except as listed in HPI, PMH and Problem List.  SH:  Social History   Socioeconomic History   Marital status: Divorced    Spouse name: Not on file   Number of children: 5   Years of education: Not on file   Highest education level: Associate degree: academic program  Occupational History   Occupation:  Retired  Tobacco Use   Smoking status: Every Day    Current packs/day: 2.00    Average packs/day: 2.0 packs/day for 50.0 years (100.0 ttl pk-yrs)    Types: Cigarettes   Smokeless tobacco: Never  Vaping Use   Vaping status: Former   Substances: Nicotine   Substance and Sexual Activity   Alcohol use: Yes   Drug use: Not Currently   Sexual activity: Not Currently  Other Topics Concern   Not on file  Social History Narrative   Lives locally by herself.  Sedentary.   Social Drivers of Corporate investment banker Strain: High Risk (05/07/2023)   Received from [facility]   Overall Financial Resource Strain (CARDIA)    Difficulty of Paying Living Expenses: Hard  Food Insecurity: Food Insecurity Present (05/07/2023)   Received from [facility]   Hunger Vital Sign    Worried About Running Out of Food in the Last Year: Sometimes true    Ran Out of Food in the Last Year: Sometimes true  Transportation Needs: No Transportation Needs (05/07/2023)   Received from [facility] - Transportation    In the past 12 months, has lack of transportation kept you from medical appointments or from getting medications?: No    Lack of Transportation (Non-Medical): No  Physical Activity: Not on file  Stress: Not on file  Social Connections: Not on file  Intimate Partner Violence: Not At Risk (04/17/2023)   Humiliation, Afraid, Rape, and Kick questionnaire    Fear of Current or Ex-Partner: No    Emotionally Abused: No    Physically Abused: No    Sexually Abused: No    FH:  Family History  Problem Relation Age of Onset   Uterine cancer Mother    Stroke Mother    Aneurysm Mother    Heart attack Father    Coronary artery disease Father    Leukemia Father    Depression Father    Diabetes Father    Hyperlipidemia Father    Hypertension Father    Colon polyps Sister    Obesity Sister    Thyroid disease Sister    COPD Sister    Lung cancer Sister    Breast cancer Maternal Aunt    Breast cancer Maternal Aunt     Past Medical History:  Diagnosis Date   Allergy    Anxiety    Chronic heart failure with preserved ejection fraction (HFpEF) (HCC)    a. 02/2023 Echo: EF 60-65%, no rwma,  GrII DD, nl RV size/fxn, mild LAE, mild Misty (mean grad ), mod MAC, mild AS (mean grad 9.76mmHg).   CKD (chronic kidney disease), stage III (HCC)    COPD (chronic obstructive pulmonary disease) (HCC)    Depression    Diabetes mellitus without complication (HCC)    Hyperlipidemia    Hypertension    Hypothyroidism    Interstitial lung disease (HCC)    PAD (peripheral artery disease) (HCC)    a. 07/2017 L subclavian stenosis s/p stenting.   Psoriasis    Substance abuse (HCC)    Tobacco abuse     Current Outpatient Medications  Medication Sig Dispense Refill   ACCU-CHEK GUIDE TEST test strip      Accu-Chek Softclix Lancets lancets SMARTSIG:Lancet Topical     acetaminophen (TYLENOL) 500 MG tablet Take 1,000 mg by mouth every 6 (six) hours as needed for moderate pain or headache.     albuterol (VENTOLIN  HFA) 108 (90 Base) MCG/ACT inhaler Inhale 2 puffs into the lungs every 6 (six) hours as needed for wheezing or shortness of breath. 1 each 3   ALPRAZolam (XANAX) 0.5 MG tablet Take 0.5 mg by mouth 2 (two) times daily as needed for anxiety.      aspirin 81 MG tablet Take 81 mg by mouth at bedtime.      atorvastatin (LIPITOR) 40 MG tablet Take 1 tablet (40 mg total) by mouth at bedtime. 30 tablet 0   bisoprolol (ZEBETA) 5 MG tablet TAKE 1 TABLET (5 MG TOTAL) BY MOUTH DAILY. 30 tablet 5   clopidogrel (PLAVIX) 75 MG tablet Take 1 tablet (75 mg total) by mouth daily with breakfast. 90 tablet 3   empagliflozin (JARDIANCE) 10 MG TABS tablet Take 1 tablet (10 mg total) by mouth daily. 30 tablet 1   ezetimibe (ZETIA) 10 MG tablet TAKE 1 TABLET BY MOUTH EVERY DAY 30 tablet 5   ferrous sulfate 325 (65 FE) MG tablet Take 1 tablet (325 mg total) by mouth daily with breakfast. 30 tablet 3   gabapentin (NEURONTIN) 100 MG capsule Take 200 mg by mouth at bedtime.     guaiFENesin-dextromethorphan (ROBITUSSIN DM) 100-10 MG/5ML syrup Take 10 mLs by mouth every 4 (four) hours as needed for cough. 118 mL 0    levothyroxine (SYNTHROID, LEVOTHROID) 125 MCG tablet Take 125 mcg by mouth daily.      ondansetron (ZOFRAN) 4 MG tablet Take 4 mg by mouth every 4 (four) hours as needed for nausea or vomiting.     OXYGEN Inhale 2 L/L into the lungs at bedtime.     pantoprazole (PROTONIX) 40 MG tablet Take 40 mg by mouth 2 (two) times daily.     spironolactone (ALDACTONE) 25 MG tablet TAKE 1 TABLET (25 MG TOTAL) BY MOUTH DAILY. 30 tablet 5   temazepam (RESTORIL) 15 MG capsule Take 15 mg by mouth at bedtime as needed for sleep.     torsemide (DEMADEX) 20 MG tablet 20mg  daily AND take an additional 20 MG if needed for weight gain or swelling. 60 tablet 5   traZODone (DESYREL) 100 MG tablet Take 100 mg by mouth daily as needed for sleep.     No current facility-administered medications for this visit.   Vitals:   06/29/23 0858  BP: (!) 118/50  Pulse: (!) 58  SpO2: 95%  Weight: 166 lb 6.4 oz (75.5 kg)   Wt Readings from Last 3 Encounters:  06/29/23 166 lb 6.4 oz (75.5 kg)  05/29/23 169 lb (76.7 kg)  05/07/23 168 lb (76.2 kg)   Lab Results  Component Value Date   CREATININE 1.52 (H) 05/29/2023   CREATININE 1.77 (H) 04/30/2023   CREATININE 1.52 (H) 04/13/2023    PHYSICAL EXAM:  General: Well appearing. No resp difficulty HEENT: normal Neck: supple, JVD elevated Cor: Regular rhythm, bradycardic. No rubs, gallops or murmurs Lungs: clear although diminished in bases Abdomen: soft, nontender, nondistended. Extremities: no cyanosis, clubbing, rash, 1+ pitting edema bilateral lower legs Neuro: alert & oriented X 3. Moves all 4 extremities w/o difficulty. Affect pleasant   ECG: not done   ReDs reading: 39 %, abnormal (see below)   ASSESSMENT & PLAN:  1: Ischemic heart failure with preserved ejection fraction- - cath with stent 03/26/23 - NYHA class III - moderately fluid overloaded with worsening symptoms (although improving) - Reds: 39% - metolazone 2.5mg  every other day X 2 doses; continue  torsemide 40mg  BID - BMET / BNP /  CXR today - weighing daily; reminded to call for overnight weight gain > 2 pounds or a weekly weight gain  >5 pounds - weight down 3 pounds from last visit here 1 month ago - Echo 03/09/23: EF 60-65% with Grade II DD, mild LAE, mild AS - continue bisoprolol 5mg  daily - continue jardiance 10mg  daily  - continue spironolactone 25mg  daily - continue torsemide 40mg  BID - put compression socks on daily - feels like she's drinking more fluids due to extremely dry mouth; encouraged sucking on hard candy. Explained that increasing/ adding diuretic will probably make this worse. Can try freezing water bottles so she can only sip on what is thawed.  - not adding salt and has been looking at food labels - BNP 03/23/23 was 554.0  2: HTN- - BP 118/50 - saw PCP Misty Adams) 01/25 - BMP 05/29/23 reviewed and showed sodium 142, potassium 4.3, creatinine 1.52, GFR 37 - was previously taken off potassium as she has a hx of hyperkalemia - BMET today  3: CAD- - saw cardiology ([place]) 12/24; has f/u end of March - continue clopidogrel 75mg  daily - continue ASA 81mg  daily - RHC/ LHC 03/26/23:   Prox RCA lesion is 45% stenosed.   CULPRIT LESION: Mid RCA lesion is 90% stenosed.   A drug-eluting stent was successfully placed using a STENT ONYX FRONTIER 3.0X22.   Post intervention, there is a 0% residual stenosis.   -------------------------------------   Hemodynamic findings consistent with mild pulmonary hypertension.  (PAP 49/24-33 mmHg, PCWP 24 mmHg transpulmonary Gradient 9 mmHg); LVEDP 16 mmHg.  4: COPD (managed by pulmonology)- - continue albuterol inhaler PRN - continue breztri BID - saw pulmonology Meredeth Ide) 12/24 - wearing oxygen at 2L at bedtime only  5: Anemia (managed by hematology)- - continue ferrous sulfate 325mg  daily - saw hematology Donneta Romberg) 01/25 - Hg 05/05/23 was 9.4  6: Hyperlipidemia- - continue atorvastatin 40mg  daily - continue ezetimibe  10mg  daily - LDL 05/05/23 was 37 - lipo (a) 03/27/23 was 54.2  7: Tobacco use- - smoking 1.5 ppd of cigarettes - states that she is considering stopping smoking because of her health - cessation encouraged   Return in 6 weeks, sooner if needed.   Clarisa Kindred, FNP

## 2023-06-29 ENCOUNTER — Encounter: Payer: Self-pay | Admitting: Family

## 2023-06-29 ENCOUNTER — Ambulatory Visit
Admission: RE | Admit: 2023-06-29 | Discharge: 2023-06-29 | Disposition: A | Discharge status: Home / Self Care | Source: Ambulatory Visit | Attending: Family | Admitting: Family

## 2023-06-29 ENCOUNTER — Ambulatory Visit (HOSPITAL_BASED_OUTPATIENT_CLINIC_OR_DEPARTMENT_OTHER): Payer: Medicare Other | Admitting: Family

## 2023-06-29 VITALS — BP 118/50 | HR 58 | Wt 166.4 lb

## 2023-06-29 DIAGNOSIS — I5032 Chronic diastolic (congestive) heart failure: Secondary | ICD-10-CM | POA: Diagnosis not present

## 2023-06-29 DIAGNOSIS — N184 Chronic kidney disease, stage 4 (severe): Secondary | ICD-10-CM | POA: Insufficient documentation

## 2023-06-29 DIAGNOSIS — D631 Anemia in chronic kidney disease: Secondary | ICD-10-CM

## 2023-06-29 DIAGNOSIS — I10 Essential (primary) hypertension: Secondary | ICD-10-CM | POA: Insufficient documentation

## 2023-06-29 DIAGNOSIS — Z720 Tobacco use: Secondary | ICD-10-CM

## 2023-06-29 DIAGNOSIS — J449 Chronic obstructive pulmonary disease, unspecified: Secondary | ICD-10-CM | POA: Insufficient documentation

## 2023-06-29 DIAGNOSIS — E782 Mixed hyperlipidemia: Secondary | ICD-10-CM | POA: Diagnosis present

## 2023-06-29 DIAGNOSIS — I2510 Atherosclerotic heart disease of native coronary artery without angina pectoris: Secondary | ICD-10-CM | POA: Insufficient documentation

## 2023-06-29 MED ORDER — METOLAZONE 2.5 MG PO TABS: 2.5000 mg | ORAL_TABLET | ORAL | 0 refills | Status: DC

## 2023-06-29 NOTE — Patient Instructions (Signed)
 Medication Changes:  Metolazone 2.5 MG EVERY OTHER DAY FOR 2 DOSES   Lab Work:  Go DOWN to LOWER LEVEL (LL) to have your blood work completed inside of [employer] office.  We will only call you if the results are abnormal or if the provider would like to make medication changes.   Special Instructions // Education:  GO OVER TO THE MEDICAL MALL AND HAVE YOUR CHEST  X-RAY COMPLETED.   Follow-Up in: 6 WEEKS WITH Misty Kindred, FNP  At the [facility], you and your health needs are our priority. We have a designated team specialized in the treatment of Heart Failure. This Care Team includes your primary Heart Failure Specialized Cardiologist (physician), Advanced Practice Providers (APPs- Physician Assistants and Nurse Practitioners), and Pharmacist who all work together to provide you with the care you need, when you need it.   You may see any of the following providers on your designated Care Team at your next follow up:  Misty Adams Misty Adams Misty Adams Misty Adams Misty Kindred, FNP Misty Adams, RPH-CPP  Please be sure to bring in all your medications bottles to every appointment.   Need to Contact us:  If you have any questions or concerns before your next appointment please send [place] a message through Misty Adams or call our office at [phone].    TO LEAVE A MESSAGE FOR THE NURSE SELECT OPTION 2, PLEASE LEAVE A MESSAGE INCLUDING: YOUR NAME DATE OF BIRTH CALL BACK NUMBER REASON FOR CALL**this is important as we prioritize the call backs  YOU WILL RECEIVE A CALL BACK THE SAME DAY AS LONG AS YOU CALL BEFORE 4:00 PM

## 2023-06-29 NOTE — Progress Notes (Signed)
 ReDS Vest / Clip - 06/29/23 0900       ReDS Vest / Clip   Station Marker B    Ruler Value 35    ReDS Value Range Moderate volume overload    ReDS Actual Value 39

## 2023-06-30 ENCOUNTER — Encounter: Payer: Self-pay | Admitting: Family

## 2023-06-30 ENCOUNTER — Other Ambulatory Visit: Payer: Self-pay | Admitting: Family

## 2023-06-30 LAB — BASIC METABOLIC PANEL
BUN/Creatinine Ratio: 18 (ref 12–28)
BUN: 34 mg/dL — ABNORMAL HIGH (ref 8–27)
CO2: 27 mmol/L (ref 20–29)
Calcium: 9.7 mg/dL (ref 8.7–10.3)
Chloride: 97 mmol/L (ref 96–106)
Creatinine, Ser: 1.89 mg/dL — ABNORMAL HIGH (ref 0.57–1.00)
Glucose: 142 mg/dL — ABNORMAL HIGH (ref 70–99)
Potassium: 3.7 mmol/L (ref 3.5–5.2)
Sodium: 141 mmol/L (ref 134–144)
eGFR: 28 mL/min/{1.73_m2} — ABNORMAL LOW (ref >59)

## 2023-06-30 LAB — BRAIN NATRIURETIC PEPTIDE: BNP: 192.3 pg/mL — ABNORMAL HIGH (ref 0.0–100.0)

## 2023-06-30 MED ORDER — POTASSIUM CHLORIDE CRYS ER 20 MEQ PO TBCR: 40.0000 meq | EXTENDED_RELEASE_TABLET | Freq: Every day | ORAL | 0 refills | Status: DC

## 2023-07-02 ENCOUNTER — Encounter: Payer: Self-pay | Admitting: Family

## 2023-07-06 ENCOUNTER — Emergency Department

## 2023-07-06 ENCOUNTER — Other Ambulatory Visit: Payer: Self-pay

## 2023-07-06 ENCOUNTER — Inpatient Hospital Stay
Admission: EM | Admit: 2023-07-06 | Discharge: 2023-07-08 | DRG: 812 | Disposition: A | Discharge status: Home / Self Care | Attending: Internal Medicine | Admitting: Internal Medicine

## 2023-07-06 DIAGNOSIS — Z833 Family history of diabetes mellitus: Secondary | ICD-10-CM

## 2023-07-06 DIAGNOSIS — R55 Syncope and collapse: Secondary | ICD-10-CM | POA: Diagnosis present

## 2023-07-06 DIAGNOSIS — Z90710 Acquired absence of both cervix and uterus: Secondary | ICD-10-CM

## 2023-07-06 DIAGNOSIS — T500X5A Adverse effect of mineralocorticoids and their antagonists, initial encounter: Secondary | ICD-10-CM | POA: Diagnosis present

## 2023-07-06 DIAGNOSIS — I130 Hypertensive heart and chronic kidney disease with heart failure and stage 1 through stage 4 chronic kidney disease, or unspecified chronic kidney disease: Secondary | ICD-10-CM | POA: Diagnosis present

## 2023-07-06 DIAGNOSIS — T501X5A Adverse effect of loop [high-ceiling] diuretics, initial encounter: Secondary | ICD-10-CM | POA: Diagnosis present

## 2023-07-06 DIAGNOSIS — Z83438 Family history of other disorder of lipoprotein metabolism and other lipidemia: Secondary | ICD-10-CM

## 2023-07-06 DIAGNOSIS — Z7902 Long term (current) use of antithrombotics/antiplatelets: Secondary | ICD-10-CM

## 2023-07-06 DIAGNOSIS — Z79899 Other long term (current) drug therapy: Secondary | ICD-10-CM

## 2023-07-06 DIAGNOSIS — T502X5A Adverse effect of carbonic-anhydrase inhibitors, benzothiadiazides and other diuretics, initial encounter: Secondary | ICD-10-CM | POA: Diagnosis present

## 2023-07-06 DIAGNOSIS — Z1152 Encounter for screening for COVID-19: Secondary | ICD-10-CM

## 2023-07-06 DIAGNOSIS — I952 Hypotension due to drugs: Secondary | ICD-10-CM

## 2023-07-06 DIAGNOSIS — Z8349 Family history of other endocrine, nutritional and metabolic diseases: Secondary | ICD-10-CM

## 2023-07-06 DIAGNOSIS — J449 Chronic obstructive pulmonary disease, unspecified: Secondary | ICD-10-CM | POA: Diagnosis present

## 2023-07-06 DIAGNOSIS — Y92009 Unspecified place in unspecified non-institutional (private) residence as the place of occurrence of the external cause: Secondary | ICD-10-CM

## 2023-07-06 DIAGNOSIS — Z825 Family history of asthma and other chronic lower respiratory diseases: Secondary | ICD-10-CM

## 2023-07-06 DIAGNOSIS — I5032 Chronic diastolic (congestive) heart failure: Secondary | ICD-10-CM | POA: Diagnosis present

## 2023-07-06 DIAGNOSIS — E876 Hypokalemia: Principal | ICD-10-CM | POA: Diagnosis present

## 2023-07-06 DIAGNOSIS — Z955 Presence of coronary angioplasty implant and graft: Secondary | ICD-10-CM

## 2023-07-06 DIAGNOSIS — D500 Iron deficiency anemia secondary to blood loss (chronic): Principal | ICD-10-CM

## 2023-07-06 DIAGNOSIS — N170 Acute kidney failure with tubular necrosis: Secondary | ICD-10-CM | POA: Diagnosis present

## 2023-07-06 DIAGNOSIS — Z79890 Hormone replacement therapy (postmenopausal): Secondary | ICD-10-CM

## 2023-07-06 DIAGNOSIS — W19XXXA Unspecified fall, initial encounter: Secondary | ICD-10-CM

## 2023-07-06 DIAGNOSIS — Z806 Family history of leukemia: Secondary | ICD-10-CM

## 2023-07-06 DIAGNOSIS — M4317 Spondylolisthesis, lumbosacral region: Secondary | ICD-10-CM | POA: Diagnosis present

## 2023-07-06 DIAGNOSIS — M4807 Spinal stenosis, lumbosacral region: Secondary | ICD-10-CM | POA: Diagnosis present

## 2023-07-06 DIAGNOSIS — Z9981 Dependence on supplemental oxygen: Secondary | ICD-10-CM

## 2023-07-06 DIAGNOSIS — E039 Hypothyroidism, unspecified: Secondary | ICD-10-CM | POA: Diagnosis present

## 2023-07-06 DIAGNOSIS — J9611 Chronic respiratory failure with hypoxia: Secondary | ICD-10-CM | POA: Diagnosis present

## 2023-07-06 DIAGNOSIS — G8929 Other chronic pain: Secondary | ICD-10-CM | POA: Diagnosis present

## 2023-07-06 DIAGNOSIS — E1122 Type 2 diabetes mellitus with diabetic chronic kidney disease: Secondary | ICD-10-CM | POA: Diagnosis present

## 2023-07-06 DIAGNOSIS — Z885 Allergy status to narcotic agent status: Secondary | ICD-10-CM

## 2023-07-06 DIAGNOSIS — Z7982 Long term (current) use of aspirin: Secondary | ICD-10-CM

## 2023-07-06 DIAGNOSIS — Z818 Family history of other mental and behavioral disorders: Secondary | ICD-10-CM

## 2023-07-06 DIAGNOSIS — Z801 Family history of malignant neoplasm of trachea, bronchus and lung: Secondary | ICD-10-CM

## 2023-07-06 DIAGNOSIS — E785 Hyperlipidemia, unspecified: Secondary | ICD-10-CM | POA: Diagnosis present

## 2023-07-06 DIAGNOSIS — Z8049 Family history of malignant neoplasm of other genital organs: Secondary | ICD-10-CM

## 2023-07-06 DIAGNOSIS — Z803 Family history of malignant neoplasm of breast: Secondary | ICD-10-CM

## 2023-07-06 DIAGNOSIS — F419 Anxiety disorder, unspecified: Secondary | ICD-10-CM | POA: Diagnosis present

## 2023-07-06 DIAGNOSIS — Z823 Family history of stroke: Secondary | ICD-10-CM

## 2023-07-06 DIAGNOSIS — I10 Essential (primary) hypertension: Secondary | ICD-10-CM | POA: Diagnosis present

## 2023-07-06 DIAGNOSIS — E1151 Type 2 diabetes mellitus with diabetic peripheral angiopathy without gangrene: Secondary | ICD-10-CM | POA: Diagnosis present

## 2023-07-06 DIAGNOSIS — N1832 Chronic kidney disease, stage 3b: Secondary | ICD-10-CM | POA: Diagnosis present

## 2023-07-06 DIAGNOSIS — F17210 Nicotine dependence, cigarettes, uncomplicated: Secondary | ICD-10-CM | POA: Diagnosis present

## 2023-07-06 DIAGNOSIS — I2510 Atherosclerotic heart disease of native coronary artery without angina pectoris: Secondary | ICD-10-CM | POA: Diagnosis present

## 2023-07-06 DIAGNOSIS — Z8249 Family history of ischemic heart disease and other diseases of the circulatory system: Secondary | ICD-10-CM

## 2023-07-06 DIAGNOSIS — D649 Anemia, unspecified: Secondary | ICD-10-CM | POA: Diagnosis present

## 2023-07-06 DIAGNOSIS — Z7984 Long term (current) use of oral hypoglycemic drugs: Secondary | ICD-10-CM

## 2023-07-06 DIAGNOSIS — W1839XA Other fall on same level, initial encounter: Secondary | ICD-10-CM | POA: Diagnosis present

## 2023-07-06 DIAGNOSIS — I7143 Infrarenal abdominal aortic aneurysm, without rupture: Secondary | ICD-10-CM | POA: Diagnosis present

## 2023-07-06 DIAGNOSIS — I272 Pulmonary hypertension, unspecified: Secondary | ICD-10-CM | POA: Diagnosis present

## 2023-07-06 DIAGNOSIS — F32A Depression, unspecified: Secondary | ICD-10-CM | POA: Diagnosis present

## 2023-07-06 LAB — CBC WITH DIFFERENTIAL/PLATELET
Abs Immature Granulocytes: 0.08 10*3/uL — ABNORMAL HIGH (ref 0.00–0.07)
Basophils Absolute: 0.1 10*3/uL (ref 0.0–0.1)
Basophils Relative: 1 %
Eosinophils Absolute: 0.1 10*3/uL (ref 0.0–0.5)
Eosinophils Relative: 1 %
HCT: 25.9 % — ABNORMAL LOW (ref 36.0–46.0)
Hemoglobin: 7.5 g/dL — ABNORMAL LOW (ref 12.0–15.0)
Immature Granulocytes: 1 %
Lymphocytes Relative: 7 %
Lymphs Abs: 0.8 10*3/uL (ref 0.7–4.0)
MCH: 24.4 pg — ABNORMAL LOW (ref 26.0–34.0)
MCHC: 29 g/dL — ABNORMAL LOW (ref 30.0–36.0)
MCV: 84.1 fL (ref 80.0–100.0)
Monocytes Absolute: 0.5 10*3/uL (ref 0.1–1.0)
Monocytes Relative: 4 %
Neutro Abs: 10.2 10*3/uL — ABNORMAL HIGH (ref 1.7–7.7)
Neutrophils Relative %: 86 %
Platelets: 371 10*3/uL (ref 150–400)
RBC: 3.08 MIL/uL — ABNORMAL LOW (ref 3.87–5.11)
RDW: 17.5 % — ABNORMAL HIGH (ref 11.5–15.5)
WBC: 11.7 10*3/uL — ABNORMAL HIGH (ref 4.0–10.5)
nRBC: 0 % (ref 0.0–0.2)

## 2023-07-06 LAB — RESP PANEL BY RT-PCR (RSV, FLU A&B, COVID)  RVPGX2
Influenza A by PCR: NEGATIVE
Influenza B by PCR: NEGATIVE
Resp Syncytial Virus by PCR: NEGATIVE
SARS Coronavirus 2 by RT PCR: NEGATIVE

## 2023-07-06 LAB — MAGNESIUM: Magnesium: 2.3 mg/dL (ref 1.7–2.4)

## 2023-07-06 LAB — COMPREHENSIVE METABOLIC PANEL
ALT: 9 U/L (ref 0–44)
AST: 10 U/L — ABNORMAL LOW (ref 15–41)
Albumin: 3.5 g/dL (ref 3.5–5.0)
Alkaline Phosphatase: 55 U/L (ref 38–126)
Anion gap: 13 (ref 5–15)
BUN: 69 mg/dL — ABNORMAL HIGH (ref 8–23)
CO2: 27 mmol/L (ref 22–32)
Calcium: 9.4 mg/dL (ref 8.9–10.3)
Chloride: 94 mmol/L — ABNORMAL LOW (ref 98–111)
Creatinine, Ser: 2.15 mg/dL — ABNORMAL HIGH (ref 0.44–1.00)
GFR, Estimated: 24 mL/min — ABNORMAL LOW (ref >60)
Glucose, Bld: 137 mg/dL — ABNORMAL HIGH (ref 70–99)
Potassium: 2.4 mmol/L — CL (ref 3.5–5.1)
Sodium: 134 mmol/L — ABNORMAL LOW (ref 135–145)
Total Bilirubin: 0.6 mg/dL (ref 0.0–1.2)
Total Protein: 6.6 g/dL (ref 6.5–8.1)

## 2023-07-06 LAB — TROPONIN I (HIGH SENSITIVITY): Troponin I (High Sensitivity): 16 ng/L (ref <18)

## 2023-07-06 LAB — LACTIC ACID, PLASMA: Lactic Acid, Venous: 0.9 mmol/L (ref 0.5–1.9)

## 2023-07-06 MED ORDER — POTASSIUM CHLORIDE CRYS ER 20 MEQ PO TBCR
40.0000 meq | EXTENDED_RELEASE_TABLET | Freq: Once | ORAL | Status: AC
  Administered 2023-07-06: 40 meq via ORAL
  Filled 2023-07-06: qty 2

## 2023-07-06 MED ORDER — POTASSIUM CHLORIDE 10 MEQ/100ML IV SOLN
10.0000 meq | Freq: Once | INTRAVENOUS | Status: AC
  Administered 2023-07-07: 10 meq via INTRAVENOUS
  Filled 2023-07-06: qty 100

## 2023-07-06 MED ORDER — POTASSIUM CHLORIDE 10 MEQ/100ML IV SOLN
10.0000 meq | Freq: Once | INTRAVENOUS | Status: AC
  Administered 2023-07-06: 10 meq via INTRAVENOUS
  Filled 2023-07-06: qty 100

## 2023-07-06 MED ORDER — SODIUM CHLORIDE 0.9 % IV BOLUS
1000.0000 mL | Freq: Once | INTRAVENOUS | Status: AC
  Administered 2023-07-06: 1000 mL via INTRAVENOUS

## 2023-07-06 NOTE — ED Provider Notes (Signed)
 [facility] Provider Note    Event Date/Time   First MD Initiated Contact with Patient 07/06/23 2137     (approximate)   History   Loss of Consciousness   HPI  Misty Adams is a 68 y.o. female with a history of COPD, hypertension, hyperlipidemia, pulmonary hypertension, GERD, subclavian artery stenosis, CKD, CAD, iron deficiency anemia, hypothyroidism, PVD, and chronic heart failure who presents with lightheadedness and multiple syncopal episodes.  The patient states that she initially started to feel somewhat lightheaded yesterday.  This evening it started to get worse.  She got up to go across her house and get a package out front when she suddenly started to feel more lightheaded and fell like she was going to pass out.  She sat down but then tried to stand up and felt like her legs gave out on her.  She fell back and believes that she hit her head.  She did not fully lose consciousness during that episode.  She reports pain to her tailbone as well as some mild pain to the back of her head.  Subsequently she had a second syncopal episode while she was with her daughter in which she fully lost consciousness for a moment.  The patient denies any chest pain.  She has no shortness of breath.  She has no nausea, vomiting, or diarrhea.  She denies any cough or fever.  I reviewed the past medical records.  The patient was most recently evaluated at the heart failure clinic on 3/10 for follow-up of her CHF.  At that time she was noted to be moderately fluid overloaded with worsening symptoms.  She was last admitted to the hospitalist service in December of last year for CHF exacerbation.   Physical Exam   Triage Vital Signs: ED Triage Vitals  Encounter Vitals Group     BP      Systolic BP Percentile      Diastolic BP Percentile      Pulse      Resp      Temp      Temp src      SpO2      Weight      Height      Head Circumference      Peak Flow       Pain Score      Pain Loc      Pain Education      Exclude from Growth Chart     Most recent vital signs: Vitals:   07/06/23 2220 07/06/23 2300  BP: (!) 108/51 (!) 127/52  Pulse: 63 61  Resp: 16 (!) 9  Temp:    SpO2: 94% 94%    General: Alert and oriented, no distress.  CV:  Good peripheral perfusion.  Resp:  Normal effort.  Lungs CTAB. Abd:  Soft and nontender.  No distention.  Other:  EOMI.  PERRLA.  No photophobia.  No facial droop.  Normal speech.  Motor intact in all extremities.  No midline cervical or thoracic spinal tenderness.  Mild sacrum/coccyx midline tenderness.   ED Results / Procedures / Treatments   Labs (all labs ordered are listed, but only abnormal results are displayed) Labs Reviewed  COMPREHENSIVE METABOLIC PANEL - Abnormal; Notable for the following components:      Result Value   Sodium 134 (*)    Potassium 2.4 (*)    Chloride 94 (*)    Glucose, Bld 137 (*)    BUN  69 (*)    Creatinine, Ser 2.15 (*)    AST 10 (*)    GFR, Estimated 24 (*)    All other components within normal limits  CBC WITH DIFFERENTIAL/PLATELET - Abnormal; Notable for the following components:   WBC 11.7 (*)    RBC 3.08 (*)    Hemoglobin 7.5 (*)    HCT 25.9 (*)    MCH 24.4 (*)    MCHC 29.0 (*)    RDW 17.5 (*)    Neutro Abs 10.2 (*)    Abs Immature Granulocytes 0.08 (*)    All other components within normal limits  RESP PANEL BY RT-PCR (RSV, FLU A&B, COVID)  RVPGX2  LACTIC ACID, PLASMA  MAGNESIUM  URINALYSIS, ROUTINE W REFLEX MICROSCOPIC  TROPONIN I (HIGH SENSITIVITY)     EKG  ED ECG REPORT I, Dionne Bucy, the attending physician, personally viewed and interpreted this ECG.  Date: 07/06/2023 EKG Time: 2219 Rate: 68 Rhythm: normal sinus rhythm QRS Axis: normal Intervals: Nonspecific IVCD; prolonged QTc ST/T Wave abnormalities: Nonspecific T wave abnormalities Narrative Interpretation: no evidence of acute ischemia    RADIOLOGY  CT head: I  independently viewed and interpreted the images; there is no ICH.  Radiology report indicates no acute abnormality.  CT lumbar spine: Pending   PROCEDURES:  Critical Care performed: No  Procedures   MEDICATIONS ORDERED IN ED: Medications  potassium chloride 10 mEq in 100 mL IVPB (has no administration in time range)  potassium chloride 10 mEq in 100 mL IVPB (has no administration in time range)  sodium chloride 0.9 % bolus 1,000 mL (1,000 mLs Intravenous New Bag/Given 07/06/23 2224)  potassium chloride SA (KLOR-CON M) CR tablet 40 mEq (40 mEq Oral Given 07/06/23 2321)     IMPRESSION / MDM / ASSESSMENT AND PLAN / ED COURSE  I reviewed the triage vital signs and the nursing notes.  68 year old female with PMH as noted above presents with lightheadedness, near syncope, and subsequent syncope.  She has some pain to the back of her head and pain to her coccyx.  She denies other injuries.  On exam her blood pressure is slightly low.  Other vital signs are normal.  There are neurologic exam is nonfocal.  The patient is alert and oriented.  Differential diagnosis includes, but is not limited to, orthostatic hypotension, dehydration/hypovolemia, electrolyte abnormality, other metabolic cause, cardiac dysrhythmia, other cardiac etiology, influenza or other viral syndrome, UTI or other infection.  EKG shows prolonged QT.  We will give a fluid bolus, obtain CT head, CT lumbar spine, lab workup, and reassess.  Patient's presentation is most consistent with acute presentation with potential threat to life or bodily function.  The patient is on the cardiac monitor to evaluate for evidence of arrhythmia and/or significant heart rate changes.  ----------------------------------------- 11:22 PM on 07/06/2023 -----------------------------------------  CT head is negative.  CBC is significant for anemia which is acute on chronic.  There is no indication for transfusion at this time.  CMP is significant  for hypokalemia which goes along with the prolonged QT seen on the EKG.  Other electrolytes are unremarkable.  Lactate is negative.  Troponin is negative.  I have ordered IV and p.o. potassium replacement.  The patient will need admission for further management.  I have signed her out to the oncoming ED physician Dr. Dolores Frame with a plan to consult the hospitalist.   FINAL CLINICAL IMPRESSION(S) / ED DIAGNOSES   Final diagnoses:  Hypokalemia  Anemia, unspecified type  Rx / DC Orders   ED Discharge Orders     None        Note:  This document was prepared using Dragon voice recognition software and may include unintentional dictation errors.    Dionne Bucy, MD 07/06/23 [phone]

## 2023-07-06 NOTE — ED Triage Notes (Signed)
 Pt arrives via ems from home with c/o syncopal episodes after her prescribing provider altered her medications. Pt denies hitting head. Pt is A&Ox4.

## 2023-07-06 NOTE — H&P (Incomplete)
 History and Physical    Patient: Misty Adams RUE:[id number] DOB: [birth year] DOA: 07/06/2023 DOS: the patient was seen and examined on 07/06/2023 PCP: Danella Penton, MD  Patient coming from: Home  Chief Complaint:  Chief Complaint  Patient presents with  . Loss of Consciousness    HPI: Misty Adams is a 68 y.o. female with medical history significant for chronic HFpEF, COPD, chronic hypoxic respiratory failure on nocturnal O2 2L at baseline, HTN, CKD stage IIIb, chronic IDA, hypothyroidism, PVD  and CAD who presents to the ED with lightheadedness and multiple presyncopal episodes in the past 24 hours resulting in a fall onto her tailbone.  She denies palpitations, chest pain shortness of breath, headache or visual disturbance.  Had no recent vomiting or diarrhea or belly pain and no cough or fever.  She was recently seen at the heart failure clinic on 3/10 when she was noted to be fluid overloaded and metolazone 2.5 mg every other day  x 2 days was added to her regimen of torsemide 40 mg twice daily. ED course and data review: Hypotensive to 95/45 with otherwise normal vitals.  BP improved with a fluid bolus to 127/52 by admission. Labs significant for potassium of 2.4, creatinine of 1.15 up from baseline of 1.52 a month ago.  Hemoglobin 7.5, down from 9.3 a couple months prior.  WBC 11,700 Magnesium 2.3, lactic acid 0.9, troponin 16 Respiratory viral panel negative UA not done EKG, personally viewed and interpreted showing sinus at 63 with no acute ST-T wave changes mild QT prolongation CT head nonacute CT lumbar spine done for tailbone pain related to a fall that showed no acute injury but degenerative changes  Patient treated with an NS bolus and given IV and oral potassium repletion. Hospitalist consulted for admission.   Review of Systems: {ROS_Text:26778}  Past Medical History:  Diagnosis Date  . Allergy   . Anxiety   . Chronic heart failure with preserved ejection  fraction (HFpEF) (HCC)    a. 02/2023 Echo: EF 60-65%, no rwma, GrII DD, nl RV size/fxn, mild LAE, mild MS (mean grad ), mod MAC, mild AS (mean grad 9.63mmHg).  . CKD (chronic kidney disease), stage III (HCC)   . COPD (chronic obstructive pulmonary disease) (HCC)   . Depression   . Diabetes mellitus without complication (HCC)   . Hyperlipidemia   . Hypertension   . Hypothyroidism   . Interstitial lung disease (HCC)   . PAD (peripheral artery disease) (HCC)    a. 07/2017 L subclavian stenosis s/p stenting.  . Psoriasis   . Substance abuse (HCC)   . Tobacco abuse    Past Surgical History:  Procedure Laterality Date  . ABDOMINAL HYSTERECTOMY    . AORTIC ARCH ANGIOGRAPHY N/A 08/06/2017   Procedure: AORTIC ARCH ANGIOGRAPHY;  Surgeon: Fransisco Hertz, MD;  Location: [facility] INVASIVE CV LAB;  Service: Cardiovascular;  Laterality: N/A;  . APPENDECTOMY    . BIOPSY  12/19/2022   Procedure: BIOPSY;  Surgeon: Jaynie Collins, DO;  Location: [facility] ENDOSCOPY;  Service: Gastroenterology;;  . CESAREAN SECTION    . COLONOSCOPY    . COLONOSCOPY WITH PROPOFOL N/A 12/07/2020   Procedure: COLONOSCOPY WITH PROPOFOL;  Surgeon: Jaynie Collins, DO;  Location: [facility] ENDOSCOPY;  Service: Gastroenterology;  Laterality: N/A;  . COLONOSCOPY WITH PROPOFOL N/A 12/19/2022   Procedure: COLONOSCOPY WITH PROPOFOL;  Surgeon: Jaynie Collins, DO;  Location: [facility] ENDOSCOPY;  Service: Gastroenterology;  Laterality: N/A;  . CORONARY STENT INTERVENTION N/A  03/26/2023   Procedure: CORONARY STENT INTERVENTION;  Surgeon: Marykay Lex, MD;  Location: [facility] INVASIVE CV LAB;  Service: Cardiovascular;  Laterality: N/A;  . dilatation and curettage    . DILATION AND CURETTAGE OF UTERUS    . ESOPHAGOGASTRODUODENOSCOPY N/A 12/07/2020   Procedure: ESOPHAGOGASTRODUODENOSCOPY (EGD);  Surgeon: Jaynie Collins, DO;  Location: [facility] ENDOSCOPY;  Service: Gastroenterology;  Laterality: N/A;  . ESOPHAGOGASTRODUODENOSCOPY (EGD)  WITH PROPOFOL N/A 12/19/2022   Procedure: ESOPHAGOGASTRODUODENOSCOPY (EGD) WITH PROPOFOL;  Surgeon: Jaynie Collins, DO;  Location: [facility] ENDOSCOPY;  Service: Gastroenterology;  Laterality: N/A;  . KNEE SURGERY    . left wrist ganglion cyst removal    . OOPHORECTOMY  1994  . PERIPHERAL VASCULAR INTERVENTION Left 08/06/2017   Procedure: PERIPHERAL VASCULAR INTERVENTION;  Surgeon: Fransisco Hertz, MD;  Location: [facility] INVASIVE CV LAB;  Service: Cardiovascular;  Laterality: Left;  subclavian  . POLYPECTOMY  12/19/2022   Procedure: POLYPECTOMY;  Surgeon: Jaynie Collins, DO;  Location: [facility] ENDOSCOPY;  Service: Gastroenterology;;  . RIGHT/LEFT HEART CATH AND CORONARY ANGIOGRAPHY N/A 03/26/2023   Procedure: RIGHT/LEFT HEART CATH AND CORONARY ANGIOGRAPHY;  Surgeon: Marykay Lex, MD;  Location: [facility] INVASIVE CV LAB;  Service: Cardiovascular;  Laterality: N/A;  . TUBAL LIGATION     Social History:  reports that she has been smoking cigarettes. She has a 100 pack-year smoking history. She has never used smokeless tobacco. She reports current alcohol use. She reports that she does not currently use drugs.  Allergies  Allergen Reactions  . Hydrocodone-Acetaminophen Hives  . Vicodin [Hydrocodone-Acetaminophen] Hives  . Trelegy Ellipta [Fluticasone-Umeclidin-Vilant] Rash    Changed to Budeson-Glycopyrrol-Formoterol by provider    Family History  Problem Relation Age of Onset  . Uterine cancer Mother   . Stroke Mother   . Aneurysm Mother   . Heart attack Father   . Coronary artery disease Father   . Leukemia Father   . Depression Father   . Diabetes Father   . Hyperlipidemia Father   . Hypertension Father   . Colon polyps Sister   . Obesity Sister   . Thyroid disease Sister   . COPD Sister   . Lung cancer Sister   . Breast cancer Maternal Aunt   . Breast cancer Maternal Aunt     Prior to Admission medications   Medication Sig Start Date End Date Taking? Authorizing Provider   ACCU-CHEK GUIDE TEST test strip  04/05/23   [provider]  Accu-Chek Softclix Lancets lancets SMARTSIG:Lancet Topical 04/05/23   [provider]  acetaminophen (TYLENOL) 500 MG tablet Take 1,000 mg by mouth every 6 (six) hours as needed for moderate pain or headache.    [provider]  albuterol (VENTOLIN HFA) 108 (90 Base) MCG/ACT inhaler Inhale 2 puffs into the lungs every 6 (six) hours as needed for wheezing or shortness of breath. 03/28/23   Enedina Finner, MD  ALPRAZolam Prudy Feeler) 0.5 MG tablet Take 0.5 mg by mouth 2 (two) times daily as needed for anxiety.  01/16/17   [provider]  aspirin 81 MG tablet Take 81 mg by mouth at bedtime.     [provider]  atorvastatin (LIPITOR) 40 MG tablet Take 1 tablet (40 mg total) by mouth at bedtime. 07/14/17   Merrily Brittle, MD  bisoprolol (ZEBETA) 5 MG tablet TAKE 1 TABLET (5 MG TOTAL) BY MOUTH DAILY. 05/25/23   Delma Freeze, FNP  clopidogrel (PLAVIX) 75 MG tablet Take 1 tablet (75 mg total) by mouth  daily with breakfast. 06/11/23   Clarisa Kindred A, FNP  empagliflozin (JARDIANCE) 10 MG TABS tablet Take 1 tablet (10 mg total) by mouth daily. 03/29/23   Enedina Finner, MD  ezetimibe (ZETIA) 10 MG tablet TAKE 1 TABLET BY MOUTH EVERY DAY 05/25/23   Clarisa Kindred A, FNP  ferrous sulfate 325 (65 FE) MG tablet Take 1 tablet (325 mg total) by mouth daily with breakfast. 03/29/23   Enedina Finner, MD  gabapentin (NEURONTIN) 100 MG capsule Take 200 mg by mouth at bedtime.    [provider]  guaiFENesin-dextromethorphan (ROBITUSSIN DM) 100-10 MG/5ML syrup Take 10 mLs by mouth every 4 (four) hours as needed for cough. 09/21/20   Regalado, Belkys A, MD  levothyroxine (SYNTHROID, LEVOTHROID) 125 MCG tablet Take 125 mcg by mouth daily.     [provider]  metolazone (ZAROXOLYN) 2.5 MG tablet Take 1 tablet (2.5 mg total) by mouth every other day for 2 doses. 06/29/23 07/02/23  Delma Freeze, FNP  ondansetron (ZOFRAN)  4 MG tablet Take 4 mg by mouth every 4 (four) hours as needed for nausea or vomiting.    [provider]  OXYGEN Inhale 2 L/L into the lungs at bedtime.    [provider]  pantoprazole (PROTONIX) 40 MG tablet Take 40 mg by mouth 2 (two) times daily.    [provider]  potassium chloride SA (KLOR-CON M) 20 MEQ tablet Take 2 tablets (40 mEq total) by mouth daily. For 2 days only 06/30/23   Clarisa Kindred A, FNP  spironolactone (ALDACTONE) 25 MG tablet TAKE 1 TABLET (25 MG TOTAL) BY MOUTH DAILY. 05/25/23   Delma Freeze, FNP  torsemide (DEMADEX) 20 MG tablet 20mg  daily AND take an additional 20 MG if needed for weight gain or swelling. Patient taking differently: Take 40 mg by mouth 2 (two) times daily. 20mg  daily AND take an additional 20 MG if needed for weight gain or swelling. 05/18/23   Delma Freeze, FNP  traZODone (DESYREL) 100 MG tablet Take 100 mg by mouth daily as needed for sleep. 08/31/20   [provider]    Physical Exam: Vitals:   07/06/23 2200 07/06/23 2220 07/06/23 2300  BP: (!) 95/45 (!) 108/51 (!) 127/52  Pulse: 60 63 61  Resp: 18 16 (!) 9  Temp: 97.9 F (36.6 C)    TempSrc: Oral    SpO2: 96% 94% 94%  Weight: 73.1 kg    Height: 5\' 4"  (1.626 m)     Physical Exam  Labs on Admission: I have personally reviewed following labs and imaging studies  CBC: Recent Labs  Lab 07/06/23 2211  WBC 11.7*  NEUTROABS 10.2*  HGB 7.5*  HCT 25.9*  MCV 84.1  PLT 371   Basic Metabolic Panel: Recent Labs  Lab 07/06/23 2211  NA 134*  K 2.4*  CL 94*  CO2 27  GLUCOSE 137*  BUN 69*  CREATININE 2.15*  CALCIUM 9.4  MG 2.3   GFR: Estimated Creatinine Clearance: 23.9 mL/min (A) (by C-G formula based on SCr of 2.15 mg/dL (H)). Liver Function Tests: Recent Labs  Lab 07/06/23 2211  AST 10*  ALT 9  ALKPHOS 55  BILITOT 0.6  PROT 6.6  ALBUMIN 3.5   No results for input(s): "LIPASE", "AMYLASE" in the last 168 hours. No results for  input(s): "AMMONIA" in the last 168 hours. Coagulation Profile: No results for input(s): "INR", "PROTIME" in the last 168 hours. Cardiac Enzymes: No results for input(s): "CKTOTAL", "CKMB", "  CKMBINDEX", "TROPONINI" in the last 168 hours. BNP (last 3 results) No results for input(s): "PROBNP" in the last 8760 hours. HbA1C: No results for input(s): "HGBA1C" in the last 72 hours. CBG: No results for input(s): "GLUCAP" in the last 168 hours. Lipid Profile: No results for input(s): "CHOL", "HDL", "LDLCALC", "TRIG", "CHOLHDL", "LDLDIRECT" in the last 72 hours. Thyroid Function Tests: No results for input(s): "TSH", "T4TOTAL", "FREET4", "T3FREE", "THYROIDAB" in the last 72 hours. Anemia Panel: No results for input(s): "VITAMINB12", "FOLATE", "FERRITIN", "TIBC", "IRON", "RETICCTPCT" in the last 72 hours. Urine analysis:    Component Value Date/Time   COLORURINE AMBER (A) 07/14/2022 1524   APPEARANCEUR CLOUDY (A) 07/14/2022 1524   LABSPEC 1.013 07/14/2022 1524   PHURINE 5.0 07/14/2022 1524   GLUCOSEU NEGATIVE 07/14/2022 1524   HGBUR MODERATE (A) 07/14/2022 1524   BILIRUBINUR NEGATIVE 07/14/2022 1524   KETONESUR NEGATIVE 07/14/2022 1524   PROTEINUR >=300 (A) 07/14/2022 1524   NITRITE NEGATIVE 07/14/2022 1524   LEUKOCYTESUR LARGE (A) 07/14/2022 1524    Radiological Exams on Admission: CT Lumbar Spine Wo Contrast Result Date: 07/06/2023 CLINICAL DATA:  Fall injury with low back pain. EXAM: CT LUMBAR SPINE WITHOUT CONTRAST TECHNIQUE: Multidetector CT imaging of the lumbar spine was performed without intravenous contrast administration. Multiplanar CT image reconstructions were also generated. RADIATION DOSE REDUCTION: This exam was performed according to the departmental dose-optimization program which includes automated exposure control, adjustment of the mA and/or kV according to patient size and/or use of iterative reconstruction technique. COMPARISON:  CT abdomen pelvis without contrast  10/16/2022. FINDINGS: Segmentation: Standard. Alignment: There is a mild chronic levoscoliosis, and mild chronic grade 1 L5-S1 anterolisthesis, the latter likely related to facet hypertrophy. No other AP listhesis is seen. Vertebrae: No acute fracture is evident or focal pathologic process. The bones are mildly demineralized. The SI joints are patent with vacuum phenomenon and mild spurring. No sacral insufficiency fracture is seen. Paraspinal and other soft tissues: Moderate to heavy aortoiliac and visceral branch vessel atherosclerosis. Stable 3 cm fusiform infrarenal AAA. Stable left adrenal adenomas. No paraspinal hematoma or mass. Disc levels: The lumbar discs are preserved in heights. There is a left far lateral disc bulge L2-3 but no significant posterior bulge and no herniation or stenosis. At L3-4 there is a mild diffuse annular bulge but no herniation or canal stenosis. There is mild facet spurring without foraminal stenosis. At L4-5, there is a mild left paracentral disc bulge without herniation or stenosis. there Is mild facet hypertrophy without foraminal compromise. At L5-S1 there is advanced left and moderate right facet hypertrophy, grade 1 spondylolisthesis. There is mild posterior disc bulge without herniation or canal stenosis. The foramina are mildly stenotic. IMPRESSION: 1. Osteopenia, scoliosis and degenerative changes without evidence of fracture. 2. Grade 1 degenerative anterolisthesis chronically at L5-S1. 3. Multilevel disc bulges with herniation or canal stenosis. 4. Mild foraminal stenosis L5-S1. 5. Pole aortic atherosclerosis with stable 3 cm infrarenal AAA. Electronically Signed   By: Almira Bar M.D.   On: 07/06/2023 23:24   CT Head Wo Contrast Result Date: 07/06/2023 CLINICAL DATA:  Fall injury with head trauma and low back pain. EXAM: CT HEAD WITHOUT CONTRAST TECHNIQUE: Contiguous axial images were obtained from the base of the skull through the vertex without intravenous  contrast. RADIATION DOSE REDUCTION: This exam was performed according to the departmental dose-optimization program which includes automated exposure control, adjustment of the mA and/or kV according to patient size and/or use of iterative reconstruction technique. COMPARISON:  MRI  brain 04/24/2022. FINDINGS: Brain: There is mild cerebral atrophy and small vessel disease with unremarkable cerebellum and brainstem. No acute cortical based infarct, hemorrhage, mass, mass effect or midline shift are seen. The ventricles are normal in size and position. There are tiny chronic lacunar infarcts in both external capsules. Basal cisterns are patent. Vascular: There are scattered calcifications in both siphons. No hyperdense central vessel is seen. Skull: Negative for fractures or focal lesions. Sinuses/Orbits: No acute findings. Clear sinuses and mastoids. Negative orbits. Other: None. IMPRESSION: No acute intracranial CT findings, depressed skull fractures or interval changes. Chronic changes. Electronically Signed   By: Almira Bar M.D.   On: 07/06/2023 23:00     Data Reviewed: Relevant notes from primary care and specialist visits, past discharge summaries as available in EHR, including Care Everywhere. Prior diagnostic testing as pertinent to current admission diagnoses Updated medications and problem lists for reconciliation ED course, including vitals, labs, imaging, treatment and response to treatment Triage notes, nursing and pharmacy notes and ED provider's notes Notable results as noted in HPI   Assessment and Plan: No notes have been filed under this hospital service. Service: Hospitalist       DVT prophylaxis: Lovenox***  Consults: none***  Advance Care Planning:   Code Status: Prior ***  Family Communication: none***  Disposition Plan: Back to previous home environment  Severity of Illness: {Observation/Inpatient:21159}  Author: Andris Baumann, MD 07/06/2023 11:36  PM  For on call review [email or web address].

## 2023-07-06 NOTE — H&P (Incomplete)
 History and Physical    Patient: Misty Adams:[id number] DOB: [birth year] DOA: 07/06/2023 DOS: the patient was seen and examined on 07/06/2023 PCP: Danella Penton, MD  Patient coming from: Home  Chief Complaint:  Chief Complaint  Patient presents with  . Loss of Consciousness    HPI: Misty Adams is a 68 y.o. female with medical history significant for chronic HFpEF, COPD, chronic hypoxic respiratory failure on nocturnal O2 2L at baseline, HTN, CKD stage IIIb, chronic IDA, hypothyroidism, PVD  and CAD who presents to the ED with lightheadedness and multiple presyncopal episodes in the past 24 hours resulting in a fall onto her tailbone.  She denies palpitations, chest pain shortness of breath, headache or visual disturbance.  Had no recent vomiting or diarrhea or belly pain and no cough or fever.  She was recently seen at the heart failure clinic on 3/10 when she was noted to be fluid overloaded and metolazone 2.5 mg every other day  x 2 days was added to her regimen of torsemide 40 mg twice daily. ED course and data review: Hypotensive to 95/45 with otherwise normal vitals.  BP improved with a fluid bolus to 127/52 by admission. Labs significant for potassium of 2.4, creatinine of 1.15 up from baseline of 1.52 a month ago.  Hemoglobin 7.5, down from 9.3 a couple months prior.  WBC 11,700 Magnesium 2.3, lactic acid 0.9, troponin 16 Respiratory viral panel negative UA not done EKG, personally viewed and interpreted showing sinus at 63 with no acute ST-T wave changes mild QT prolongation CT head nonacute CT lumbar spine done for tailbone pain related to a fall that showed no acute injury but degenerative changes  Patient treated with an NS bolus and given IV and oral potassium repletion. Hospitalist consulted for admission.   Review of Systems: {ROS_Text:26778}  Past Medical History:  Diagnosis Date  . Allergy   . Anxiety   . Chronic heart failure with preserved ejection  fraction (HFpEF) (HCC)    a. 02/2023 Echo: EF 60-65%, no rwma, GrII DD, nl RV size/fxn, mild LAE, mild MS (mean grad ), mod MAC, mild AS (mean grad 9.63mmHg).  . CKD (chronic kidney disease), stage III (HCC)   . COPD (chronic obstructive pulmonary disease) (HCC)   . Depression   . Diabetes mellitus without complication (HCC)   . Hyperlipidemia   . Hypertension   . Hypothyroidism   . Interstitial lung disease (HCC)   . PAD (peripheral artery disease) (HCC)    a. 07/2017 L subclavian stenosis s/p stenting.  . Psoriasis   . Substance abuse (HCC)   . Tobacco abuse    Past Surgical History:  Procedure Laterality Date  . ABDOMINAL HYSTERECTOMY    . AORTIC ARCH ANGIOGRAPHY N/A 08/06/2017   Procedure: AORTIC ARCH ANGIOGRAPHY;  Surgeon: Fransisco Hertz, MD;  Location: [facility] INVASIVE CV LAB;  Service: Cardiovascular;  Laterality: N/A;  . APPENDECTOMY    . BIOPSY  12/19/2022   Procedure: BIOPSY;  Surgeon: Jaynie Collins, DO;  Location: [facility] ENDOSCOPY;  Service: Gastroenterology;;  . CESAREAN SECTION    . COLONOSCOPY    . COLONOSCOPY WITH PROPOFOL N/A 12/07/2020   Procedure: COLONOSCOPY WITH PROPOFOL;  Surgeon: Jaynie Collins, DO;  Location: [facility] ENDOSCOPY;  Service: Gastroenterology;  Laterality: N/A;  . COLONOSCOPY WITH PROPOFOL N/A 12/19/2022   Procedure: COLONOSCOPY WITH PROPOFOL;  Surgeon: Jaynie Collins, DO;  Location: [facility] ENDOSCOPY;  Service: Gastroenterology;  Laterality: N/A;  . CORONARY STENT INTERVENTION N/A  03/26/2023   Procedure: CORONARY STENT INTERVENTION;  Surgeon: Marykay Lex, MD;  Location: [facility] INVASIVE CV LAB;  Service: Cardiovascular;  Laterality: N/A;  . dilatation and curettage    . DILATION AND CURETTAGE OF UTERUS    . ESOPHAGOGASTRODUODENOSCOPY N/A 12/07/2020   Procedure: ESOPHAGOGASTRODUODENOSCOPY (EGD);  Surgeon: Jaynie Collins, DO;  Location: [facility] ENDOSCOPY;  Service: Gastroenterology;  Laterality: N/A;  . ESOPHAGOGASTRODUODENOSCOPY (EGD)  WITH PROPOFOL N/A 12/19/2022   Procedure: ESOPHAGOGASTRODUODENOSCOPY (EGD) WITH PROPOFOL;  Surgeon: Jaynie Collins, DO;  Location: [facility] ENDOSCOPY;  Service: Gastroenterology;  Laterality: N/A;  . KNEE SURGERY    . left wrist ganglion cyst removal    . OOPHORECTOMY  1994  . PERIPHERAL VASCULAR INTERVENTION Left 08/06/2017   Procedure: PERIPHERAL VASCULAR INTERVENTION;  Surgeon: Fransisco Hertz, MD;  Location: [facility] INVASIVE CV LAB;  Service: Cardiovascular;  Laterality: Left;  subclavian  . POLYPECTOMY  12/19/2022   Procedure: POLYPECTOMY;  Surgeon: Jaynie Collins, DO;  Location: [facility] ENDOSCOPY;  Service: Gastroenterology;;  . RIGHT/LEFT HEART CATH AND CORONARY ANGIOGRAPHY N/A 03/26/2023   Procedure: RIGHT/LEFT HEART CATH AND CORONARY ANGIOGRAPHY;  Surgeon: Marykay Lex, MD;  Location: [facility] INVASIVE CV LAB;  Service: Cardiovascular;  Laterality: N/A;  . TUBAL LIGATION     Social History:  reports that she has been smoking cigarettes. She has a 100 pack-year smoking history. She has never used smokeless tobacco. She reports current alcohol use. She reports that she does not currently use drugs.  Allergies  Allergen Reactions  . Hydrocodone-Acetaminophen Hives  . Vicodin [Hydrocodone-Acetaminophen] Hives  . Trelegy Ellipta [Fluticasone-Umeclidin-Vilant] Rash    Changed to Budeson-Glycopyrrol-Formoterol by provider    Family History  Problem Relation Age of Onset  . Uterine cancer Mother   . Stroke Mother   . Aneurysm Mother   . Heart attack Father   . Coronary artery disease Father   . Leukemia Father   . Depression Father   . Diabetes Father   . Hyperlipidemia Father   . Hypertension Father   . Colon polyps Sister   . Obesity Sister   . Thyroid disease Sister   . COPD Sister   . Lung cancer Sister   . Breast cancer Maternal Aunt   . Breast cancer Maternal Aunt     Prior to Admission medications   Medication Sig Start Date End Date Taking? Authorizing Provider   ACCU-CHEK GUIDE TEST test strip  04/05/23   [provider]  Accu-Chek Softclix Lancets lancets SMARTSIG:Lancet Topical 04/05/23   [provider]  acetaminophen (TYLENOL) 500 MG tablet Take 1,000 mg by mouth every 6 (six) hours as needed for moderate pain or headache.    [provider]  albuterol (VENTOLIN HFA) 108 (90 Base) MCG/ACT inhaler Inhale 2 puffs into the lungs every 6 (six) hours as needed for wheezing or shortness of breath. 03/28/23   Enedina Finner, MD  ALPRAZolam Prudy Feeler) 0.5 MG tablet Take 0.5 mg by mouth 2 (two) times daily as needed for anxiety.  01/16/17   [provider]  aspirin 81 MG tablet Take 81 mg by mouth at bedtime.     [provider]  atorvastatin (LIPITOR) 40 MG tablet Take 1 tablet (40 mg total) by mouth at bedtime. 07/14/17   Merrily Brittle, MD  bisoprolol (ZEBETA) 5 MG tablet TAKE 1 TABLET (5 MG TOTAL) BY MOUTH DAILY. 05/25/23   Delma Freeze, FNP  clopidogrel (PLAVIX) 75 MG tablet Take 1 tablet (75 mg total) by mouth  daily with breakfast. 06/11/23   Clarisa Kindred A, FNP  empagliflozin (JARDIANCE) 10 MG TABS tablet Take 1 tablet (10 mg total) by mouth daily. 03/29/23   Enedina Finner, MD  ezetimibe (ZETIA) 10 MG tablet TAKE 1 TABLET BY MOUTH EVERY DAY 05/25/23   Clarisa Kindred A, FNP  ferrous sulfate 325 (65 FE) MG tablet Take 1 tablet (325 mg total) by mouth daily with breakfast. 03/29/23   Enedina Finner, MD  gabapentin (NEURONTIN) 100 MG capsule Take 200 mg by mouth at bedtime.    [provider]  guaiFENesin-dextromethorphan (ROBITUSSIN DM) 100-10 MG/5ML syrup Take 10 mLs by mouth every 4 (four) hours as needed for cough. 09/21/20   Regalado, Belkys A, MD  levothyroxine (SYNTHROID, LEVOTHROID) 125 MCG tablet Take 125 mcg by mouth daily.     [provider]  metolazone (ZAROXOLYN) 2.5 MG tablet Take 1 tablet (2.5 mg total) by mouth every other day for 2 doses. 06/29/23 07/02/23  Delma Freeze, FNP  ondansetron (ZOFRAN)  4 MG tablet Take 4 mg by mouth every 4 (four) hours as needed for nausea or vomiting.    [provider]  OXYGEN Inhale 2 L/L into the lungs at bedtime.    [provider]  pantoprazole (PROTONIX) 40 MG tablet Take 40 mg by mouth 2 (two) times daily.    [provider]  potassium chloride SA (KLOR-CON M) 20 MEQ tablet Take 2 tablets (40 mEq total) by mouth daily. For 2 days only 06/30/23   Clarisa Kindred A, FNP  spironolactone (ALDACTONE) 25 MG tablet TAKE 1 TABLET (25 MG TOTAL) BY MOUTH DAILY. 05/25/23   Delma Freeze, FNP  torsemide (DEMADEX) 20 MG tablet 20mg  daily AND take an additional 20 MG if needed for weight gain or swelling. Patient taking differently: Take 40 mg by mouth 2 (two) times daily. 20mg  daily AND take an additional 20 MG if needed for weight gain or swelling. 05/18/23   Delma Freeze, FNP  traZODone (DESYREL) 100 MG tablet Take 100 mg by mouth daily as needed for sleep. 08/31/20   [provider]    Physical Exam: Vitals:   07/06/23 2200 07/06/23 2220 07/06/23 2300  BP: (!) 95/45 (!) 108/51 (!) 127/52  Pulse: 60 63 61  Resp: 18 16 (!) 9  Temp: 97.9 F (36.6 C)    TempSrc: Oral    SpO2: 96% 94% 94%  Weight: 73.1 kg    Height: 5\' 4"  (1.626 m)     Physical Exam  Labs on Admission: I have personally reviewed following labs and imaging studies  CBC: Recent Labs  Lab 07/06/23 2211  WBC 11.7*  NEUTROABS 10.2*  HGB 7.5*  HCT 25.9*  MCV 84.1  PLT 371   Basic Metabolic Panel: Recent Labs  Lab 07/06/23 2211  NA 134*  K 2.4*  CL 94*  CO2 27  GLUCOSE 137*  BUN 69*  CREATININE 2.15*  CALCIUM 9.4  MG 2.3   GFR: Estimated Creatinine Clearance: 23.9 mL/min (A) (by C-G formula based on SCr of 2.15 mg/dL (H)). Liver Function Tests: Recent Labs  Lab 07/06/23 2211  AST 10*  ALT 9  ALKPHOS 55  BILITOT 0.6  PROT 6.6  ALBUMIN 3.5   No results for input(s): "LIPASE", "AMYLASE" in the last 168 hours. No results for  input(s): "AMMONIA" in the last 168 hours. Coagulation Profile: No results for input(s): "INR", "PROTIME" in the last 168 hours. Cardiac Enzymes: No results for input(s): "CKTOTAL", "CKMB", "  CKMBINDEX", "TROPONINI" in the last 168 hours. BNP (last 3 results) No results for input(s): "PROBNP" in the last 8760 hours. HbA1C: No results for input(s): "HGBA1C" in the last 72 hours. CBG: No results for input(s): "GLUCAP" in the last 168 hours. Lipid Profile: No results for input(s): "CHOL", "HDL", "LDLCALC", "TRIG", "CHOLHDL", "LDLDIRECT" in the last 72 hours. Thyroid Function Tests: No results for input(s): "TSH", "T4TOTAL", "FREET4", "T3FREE", "THYROIDAB" in the last 72 hours. Anemia Panel: No results for input(s): "VITAMINB12", "FOLATE", "FERRITIN", "TIBC", "IRON", "RETICCTPCT" in the last 72 hours. Urine analysis:    Component Value Date/Time   COLORURINE AMBER (A) 07/14/2022 1524   APPEARANCEUR CLOUDY (A) 07/14/2022 1524   LABSPEC 1.013 07/14/2022 1524   PHURINE 5.0 07/14/2022 1524   GLUCOSEU NEGATIVE 07/14/2022 1524   HGBUR MODERATE (A) 07/14/2022 1524   BILIRUBINUR NEGATIVE 07/14/2022 1524   KETONESUR NEGATIVE 07/14/2022 1524   PROTEINUR >=300 (A) 07/14/2022 1524   NITRITE NEGATIVE 07/14/2022 1524   LEUKOCYTESUR LARGE (A) 07/14/2022 1524    Radiological Exams on Admission: CT Lumbar Spine Wo Contrast Result Date: 07/06/2023 CLINICAL DATA:  Fall injury with low back pain. EXAM: CT LUMBAR SPINE WITHOUT CONTRAST TECHNIQUE: Multidetector CT imaging of the lumbar spine was performed without intravenous contrast administration. Multiplanar CT image reconstructions were also generated. RADIATION DOSE REDUCTION: This exam was performed according to the departmental dose-optimization program which includes automated exposure control, adjustment of the mA and/or kV according to patient size and/or use of iterative reconstruction technique. COMPARISON:  CT abdomen pelvis without contrast  10/16/2022. FINDINGS: Segmentation: Standard. Alignment: There is a mild chronic levoscoliosis, and mild chronic grade 1 L5-S1 anterolisthesis, the latter likely related to facet hypertrophy. No other AP listhesis is seen. Vertebrae: No acute fracture is evident or focal pathologic process. The bones are mildly demineralized. The SI joints are patent with vacuum phenomenon and mild spurring. No sacral insufficiency fracture is seen. Paraspinal and other soft tissues: Moderate to heavy aortoiliac and visceral branch vessel atherosclerosis. Stable 3 cm fusiform infrarenal AAA. Stable left adrenal adenomas. No paraspinal hematoma or mass. Disc levels: The lumbar discs are preserved in heights. There is a left far lateral disc bulge L2-3 but no significant posterior bulge and no herniation or stenosis. At L3-4 there is a mild diffuse annular bulge but no herniation or canal stenosis. There is mild facet spurring without foraminal stenosis. At L4-5, there is a mild left paracentral disc bulge without herniation or stenosis. there Is mild facet hypertrophy without foraminal compromise. At L5-S1 there is advanced left and moderate right facet hypertrophy, grade 1 spondylolisthesis. There is mild posterior disc bulge without herniation or canal stenosis. The foramina are mildly stenotic. IMPRESSION: 1. Osteopenia, scoliosis and degenerative changes without evidence of fracture. 2. Grade 1 degenerative anterolisthesis chronically at L5-S1. 3. Multilevel disc bulges with herniation or canal stenosis. 4. Mild foraminal stenosis L5-S1. 5. Pole aortic atherosclerosis with stable 3 cm infrarenal AAA. Electronically Signed   By: Almira Bar M.D.   On: 07/06/2023 23:24   CT Head Wo Contrast Result Date: 07/06/2023 CLINICAL DATA:  Fall injury with head trauma and low back pain. EXAM: CT HEAD WITHOUT CONTRAST TECHNIQUE: Contiguous axial images were obtained from the base of the skull through the vertex without intravenous  contrast. RADIATION DOSE REDUCTION: This exam was performed according to the departmental dose-optimization program which includes automated exposure control, adjustment of the mA and/or kV according to patient size and/or use of iterative reconstruction technique. COMPARISON:  MRI  brain 04/24/2022. FINDINGS: Brain: There is mild cerebral atrophy and small vessel disease with unremarkable cerebellum and brainstem. No acute cortical based infarct, hemorrhage, mass, mass effect or midline shift are seen. The ventricles are normal in size and position. There are tiny chronic lacunar infarcts in both external capsules. Basal cisterns are patent. Vascular: There are scattered calcifications in both siphons. No hyperdense central vessel is seen. Skull: Negative for fractures or focal lesions. Sinuses/Orbits: No acute findings. Clear sinuses and mastoids. Negative orbits. Other: None. IMPRESSION: No acute intracranial CT findings, depressed skull fractures or interval changes. Chronic changes. Electronically Signed   By: Almira Bar M.D.   On: 07/06/2023 23:00     Data Reviewed: Relevant notes from primary care and specialist visits, past discharge summaries as available in EHR, including Care Everywhere. Prior diagnostic testing as pertinent to current admission diagnoses Updated medications and problem lists for reconciliation ED course, including vitals, labs, imaging, treatment and response to treatment Triage notes, nursing and pharmacy notes and ED provider's notes Notable results as noted in HPI   Assessment and Plan: No notes have been filed under this hospital service. Service: Hospitalist       DVT prophylaxis: Lovenox***  Consults: none***  Advance Care Planning:   Code Status: Prior ***  Family Communication: none***  Disposition Plan: Back to previous home environment  Severity of Illness: {Observation/Inpatient:21159}  Author: Andris Baumann, MD 07/06/2023 11:36  PM  For on call review [email or web address].  to be reasonable and necessary in order to provide the required intensity of service to ensure the patient's safety. The patient's presenting symptoms, physical exam findings, and initial radiographic and laboratory data in the context of their medical condition is felt to place them at decreased risk for further clinical deterioration. Furthermore, it is anticipated that the patient will be medically stable for discharge from the hospital within 2 midnights of admission.   Author: Andris Baumann, MD 07/06/2023 11:36 PM  For on call review [email or web address].

## 2023-07-07 ENCOUNTER — Encounter: Payer: Self-pay | Admitting: Internal Medicine

## 2023-07-07 DIAGNOSIS — F32A Depression, unspecified: Secondary | ICD-10-CM | POA: Diagnosis present

## 2023-07-07 DIAGNOSIS — D649 Anemia, unspecified: Secondary | ICD-10-CM | POA: Diagnosis not present

## 2023-07-07 DIAGNOSIS — W1839XA Other fall on same level, initial encounter: Secondary | ICD-10-CM | POA: Diagnosis present

## 2023-07-07 DIAGNOSIS — I952 Hypotension due to drugs: Secondary | ICD-10-CM | POA: Diagnosis present

## 2023-07-07 DIAGNOSIS — D500 Iron deficiency anemia secondary to blood loss (chronic): Secondary | ICD-10-CM | POA: Diagnosis present

## 2023-07-07 DIAGNOSIS — I272 Pulmonary hypertension, unspecified: Secondary | ICD-10-CM | POA: Diagnosis present

## 2023-07-07 DIAGNOSIS — D509 Iron deficiency anemia, unspecified: Secondary | ICD-10-CM | POA: Diagnosis not present

## 2023-07-07 DIAGNOSIS — Z79890 Hormone replacement therapy (postmenopausal): Secondary | ICD-10-CM | POA: Diagnosis not present

## 2023-07-07 DIAGNOSIS — G8929 Other chronic pain: Secondary | ICD-10-CM | POA: Diagnosis present

## 2023-07-07 DIAGNOSIS — Y92009 Unspecified place in unspecified non-institutional (private) residence as the place of occurrence of the external cause: Secondary | ICD-10-CM | POA: Diagnosis not present

## 2023-07-07 DIAGNOSIS — I7143 Infrarenal abdominal aortic aneurysm, without rupture: Secondary | ICD-10-CM | POA: Diagnosis present

## 2023-07-07 DIAGNOSIS — Z7984 Long term (current) use of oral hypoglycemic drugs: Secondary | ICD-10-CM | POA: Diagnosis not present

## 2023-07-07 DIAGNOSIS — E039 Hypothyroidism, unspecified: Secondary | ICD-10-CM | POA: Diagnosis present

## 2023-07-07 DIAGNOSIS — R55 Syncope and collapse: Secondary | ICD-10-CM | POA: Diagnosis present

## 2023-07-07 DIAGNOSIS — Z7982 Long term (current) use of aspirin: Secondary | ICD-10-CM | POA: Diagnosis not present

## 2023-07-07 DIAGNOSIS — N1832 Chronic kidney disease, stage 3b: Secondary | ICD-10-CM | POA: Diagnosis present

## 2023-07-07 DIAGNOSIS — J449 Chronic obstructive pulmonary disease, unspecified: Secondary | ICD-10-CM | POA: Diagnosis present

## 2023-07-07 DIAGNOSIS — T502X5A Adverse effect of carbonic-anhydrase inhibitors, benzothiadiazides and other diuretics, initial encounter: Secondary | ICD-10-CM | POA: Diagnosis present

## 2023-07-07 DIAGNOSIS — E876 Hypokalemia: Secondary | ICD-10-CM | POA: Diagnosis present

## 2023-07-07 DIAGNOSIS — I5032 Chronic diastolic (congestive) heart failure: Secondary | ICD-10-CM | POA: Diagnosis present

## 2023-07-07 DIAGNOSIS — E1151 Type 2 diabetes mellitus with diabetic peripheral angiopathy without gangrene: Secondary | ICD-10-CM | POA: Diagnosis present

## 2023-07-07 DIAGNOSIS — E785 Hyperlipidemia, unspecified: Secondary | ICD-10-CM | POA: Diagnosis present

## 2023-07-07 DIAGNOSIS — F17210 Nicotine dependence, cigarettes, uncomplicated: Secondary | ICD-10-CM | POA: Diagnosis present

## 2023-07-07 DIAGNOSIS — I2510 Atherosclerotic heart disease of native coronary artery without angina pectoris: Secondary | ICD-10-CM | POA: Diagnosis present

## 2023-07-07 DIAGNOSIS — Z7902 Long term (current) use of antithrombotics/antiplatelets: Secondary | ICD-10-CM | POA: Diagnosis not present

## 2023-07-07 DIAGNOSIS — J9611 Chronic respiratory failure with hypoxia: Secondary | ICD-10-CM | POA: Diagnosis present

## 2023-07-07 DIAGNOSIS — I130 Hypertensive heart and chronic kidney disease with heart failure and stage 1 through stage 4 chronic kidney disease, or unspecified chronic kidney disease: Secondary | ICD-10-CM | POA: Diagnosis present

## 2023-07-07 DIAGNOSIS — E1122 Type 2 diabetes mellitus with diabetic chronic kidney disease: Secondary | ICD-10-CM | POA: Diagnosis present

## 2023-07-07 DIAGNOSIS — Z1152 Encounter for screening for COVID-19: Secondary | ICD-10-CM | POA: Diagnosis not present

## 2023-07-07 LAB — CBC
HCT: 22.7 % — ABNORMAL LOW (ref 36.0–46.0)
Hemoglobin: 6.6 g/dL — ABNORMAL LOW (ref 12.0–15.0)
MCH: 24.4 pg — ABNORMAL LOW (ref 26.0–34.0)
MCHC: 29.1 g/dL — ABNORMAL LOW (ref 30.0–36.0)
MCV: 84.1 fL (ref 80.0–100.0)
Platelets: 350 10*3/uL (ref 150–400)
RBC: 2.7 MIL/uL — ABNORMAL LOW (ref 3.87–5.11)
RDW: 17.6 % — ABNORMAL HIGH (ref 11.5–15.5)
WBC: 12.2 10*3/uL — ABNORMAL HIGH (ref 4.0–10.5)
nRBC: 0 % (ref 0.0–0.2)

## 2023-07-07 LAB — URINALYSIS, ROUTINE W REFLEX MICROSCOPIC
Bilirubin Urine: NEGATIVE
Glucose, UA: 50 mg/dL — AB
Hgb urine dipstick: NEGATIVE
Ketones, ur: NEGATIVE mg/dL
Leukocytes,Ua: NEGATIVE
Nitrite: NEGATIVE
Protein, ur: NEGATIVE mg/dL
Specific Gravity, Urine: 1.009 (ref 1.005–1.030)
pH: 6 (ref 5.0–8.0)

## 2023-07-07 LAB — BASIC METABOLIC PANEL
Anion gap: 10 (ref 5–15)
BUN: 63 mg/dL — ABNORMAL HIGH (ref 8–23)
CO2: 23 mmol/L (ref 22–32)
Calcium: 8.4 mg/dL — ABNORMAL LOW (ref 8.9–10.3)
Chloride: 99 mmol/L (ref 98–111)
Creatinine, Ser: 2.07 mg/dL — ABNORMAL HIGH (ref 0.44–1.00)
GFR, Estimated: 25 mL/min — ABNORMAL LOW (ref >60)
Glucose, Bld: 267 mg/dL — ABNORMAL HIGH (ref 70–99)
Potassium: 2.9 mmol/L — ABNORMAL LOW (ref 3.5–5.1)
Sodium: 132 mmol/L — ABNORMAL LOW (ref 135–145)

## 2023-07-07 LAB — IRON AND TIBC
Iron: 8 ug/dL — ABNORMAL LOW (ref 28–170)
Saturation Ratios: 2 % — ABNORMAL LOW (ref 10.4–31.8)
TIBC: 382 ug/dL (ref 250–450)
UIBC: 374 ug/dL

## 2023-07-07 LAB — TROPONIN I (HIGH SENSITIVITY): Troponin I (High Sensitivity): 14 ng/L (ref <18)

## 2023-07-07 LAB — PREPARE RBC (CROSSMATCH)

## 2023-07-07 LAB — CBG MONITORING, ED: Glucose-Capillary: 156 mg/dL — ABNORMAL HIGH (ref 70–99)

## 2023-07-07 LAB — FERRITIN: Ferritin: 5 ng/mL — ABNORMAL LOW (ref 11–307)

## 2023-07-07 MED ORDER — ACETAMINOPHEN 650 MG RE SUPP: 650.0000 mg | Freq: Four times a day (QID) | RECTAL | Status: DC | PRN

## 2023-07-07 MED ORDER — LEVOTHYROXINE SODIUM 25 MCG PO TABS
125.0000 ug | ORAL_TABLET | Freq: Every day | ORAL | Status: DC
  Administered 2023-07-07: 125 ug via ORAL
  Filled 2023-07-07: qty 3
  Filled 2023-07-07: qty 1

## 2023-07-07 MED ORDER — POTASSIUM CHLORIDE CRYS ER 20 MEQ PO TBCR
40.0000 meq | EXTENDED_RELEASE_TABLET | Freq: Once | ORAL | Status: AC
  Administered 2023-07-07: 40 meq via ORAL
  Filled 2023-07-07: qty 2

## 2023-07-07 MED ORDER — SODIUM CHLORIDE 0.9% FLUSH
3.0000 mL | Freq: Two times a day (BID) | INTRAVENOUS | Status: DC
  Administered 2023-07-07 – 2023-07-08 (×4): 3 mL via INTRAVENOUS

## 2023-07-07 MED ORDER — ACETAMINOPHEN 325 MG PO TABS
650.0000 mg | ORAL_TABLET | Freq: Four times a day (QID) | ORAL | Status: DC | PRN
  Administered 2023-07-07 – 2023-07-08 (×2): 650 mg via ORAL
  Filled 2023-07-07 (×2): qty 2

## 2023-07-07 MED ORDER — SODIUM CHLORIDE 0.9% IV SOLUTION
Freq: Once | INTRAVENOUS | Status: AC
  Filled 2023-07-07: qty 250

## 2023-07-07 MED ORDER — POTASSIUM CHLORIDE CRYS ER 20 MEQ PO TBCR
40.0000 meq | EXTENDED_RELEASE_TABLET | Freq: Every day | ORAL | Status: DC
  Administered 2023-07-07 – 2023-07-08 (×2): 40 meq via ORAL
  Filled 2023-07-07 (×2): qty 2

## 2023-07-07 MED ORDER — ONDANSETRON HCL 4 MG PO TABS: 4.0000 mg | ORAL_TABLET | Freq: Four times a day (QID) | ORAL | Status: DC | PRN

## 2023-07-07 MED ORDER — HYDROCODONE-ACETAMINOPHEN 5-325 MG PO TABS
1.0000 | ORAL_TABLET | ORAL | Status: DC | PRN
  Filled 2023-07-07: qty 1

## 2023-07-07 MED ORDER — ONDANSETRON HCL 4 MG/2ML IJ SOLN: 4.0000 mg | Freq: Four times a day (QID) | INTRAMUSCULAR | Status: DC | PRN

## 2023-07-07 MED ORDER — ALBUTEROL SULFATE HFA 108 (90 BASE) MCG/ACT IN AERS: 2.0000 | INHALATION_SPRAY | Freq: Four times a day (QID) | RESPIRATORY_TRACT | Status: DC | PRN

## 2023-07-07 MED ORDER — SODIUM CHLORIDE 0.9 % IV BOLUS
500.0000 mL | Freq: Once | INTRAVENOUS | Status: AC
  Administered 2023-07-07: 500 mL via INTRAVENOUS

## 2023-07-07 MED ORDER — ALBUTEROL SULFATE (2.5 MG/3ML) 0.083% IN NEBU: 2.5000 mg | INHALATION_SOLUTION | Freq: Four times a day (QID) | RESPIRATORY_TRACT | Status: DC | PRN

## 2023-07-07 MED ORDER — ATORVASTATIN CALCIUM 20 MG PO TABS
40.0000 mg | ORAL_TABLET | Freq: Every day | ORAL | Status: DC
  Administered 2023-07-07: 40 mg via ORAL
  Filled 2023-07-07: qty 2

## 2023-07-07 MED ORDER — PANTOPRAZOLE SODIUM 40 MG PO TBEC
40.0000 mg | DELAYED_RELEASE_TABLET | Freq: Two times a day (BID) | ORAL | Status: DC
Start: 2023-07-07 — End: 2023-07-08
  Administered 2023-07-07 – 2023-07-08 (×3): 40 mg via ORAL
  Filled 2023-07-07 (×3): qty 1

## 2023-07-07 NOTE — Consult Note (Signed)
 Misty Minium, MD [facility]  [address], [address] [place], [place] [address] Phone: [phone] Fax : [phone]  Consultation  Referring Provider:     Dr. Para March Primary Care Physician:  Danella Penton, MD Primary Gastroenterologist:  Dr. Timothy Lasso         Reason for Consultation:     Anemia  Date of Admission:  07/06/2023 Date of Consultation:  07/07/2023         HPI:   Misty Adams is a 68 y.o. female who has a history of anemia and had a colonoscopy in 2024 and prior to that had a colonoscopy in 2022.  At those times the patient also had undergone an upper endoscopy.  The patient was found to have multiple polyps on the last colonoscopy some of which were over 10 mm.  The patient was recommended to undergo a capsule endoscopy but that was never done.  Subsequent to that the patient was put on dual antiplatelet therapy for card stenting and December.  It was recommended at that time that the patient not go off of her anticoagulation and a repeat colonoscopy which was recommended in a few months after the previous colonoscopy was not done due to the patient being on anticoagulation. The patient was now admitted with blood work showing an iron level of 8 with a iron saturation of 2.  The patient's hemoglobin 2 months ago was 9.3 and 3 months ago it was 10.3.  The patient now was admitted with a hemoglobin of 7.5 that has gone down to 6.6. It was reported that the patient came in with loss of consciousness.  She was also noted to have hypotension.  I am now being asked to see the patient for further evaluation for the anemia.  Past Medical History:  Diagnosis Date   Allergy    Anxiety    Chronic heart failure with preserved ejection fraction (HFpEF) (HCC)    a. 02/2023 Echo: EF 60-65%, no rwma, GrII DD, nl RV size/fxn, mild LAE, mild MS (mean grad ), mod MAC, mild AS (mean grad 9.33mmHg).   CKD (chronic kidney disease), stage III (HCC)    COPD (chronic obstructive pulmonary  disease) (HCC)    Depression    Diabetes mellitus without complication (HCC)    Hyperlipidemia    Hypertension    Hypothyroidism    Interstitial lung disease (HCC)    PAD (peripheral artery disease) (HCC)    a. 07/2017 L subclavian stenosis s/p stenting.   Psoriasis    Substance abuse (HCC)    Tobacco abuse     Past Surgical History:  Procedure Laterality Date   ABDOMINAL HYSTERECTOMY     AORTIC ARCH ANGIOGRAPHY N/A 08/06/2017   Procedure: AORTIC ARCH ANGIOGRAPHY;  Surgeon: Fransisco Hertz, MD;  Location: [facility] INVASIVE CV LAB;  Service: Cardiovascular;  Laterality: N/A;   APPENDECTOMY     BIOPSY  12/19/2022   Procedure: BIOPSY;  Surgeon: Jaynie Collins, DO;  Location: [facility] ENDOSCOPY;  Service: Gastroenterology;;   CESAREAN SECTION     COLONOSCOPY     COLONOSCOPY WITH PROPOFOL N/A 12/07/2020   Procedure: COLONOSCOPY WITH PROPOFOL;  Surgeon: Jaynie Collins, DO;  Location: [facility] ENDOSCOPY;  Service: Gastroenterology;  Laterality: N/A;   COLONOSCOPY WITH PROPOFOL N/A 12/19/2022   Procedure: COLONOSCOPY WITH PROPOFOL;  Surgeon: Jaynie Collins, DO;  Location: [facility] ENDOSCOPY;  Service: Gastroenterology;  Laterality: N/A;   CORONARY STENT INTERVENTION N/A 03/26/2023   Procedure: CORONARY  STENT INTERVENTION;  Surgeon: Marykay Lex, MD;  Location: [facility] INVASIVE CV LAB;  Service: Cardiovascular;  Laterality: N/A;   dilatation and curettage     DILATION AND CURETTAGE OF UTERUS     ESOPHAGOGASTRODUODENOSCOPY N/A 12/07/2020   Procedure: ESOPHAGOGASTRODUODENOSCOPY (EGD);  Surgeon: Jaynie Collins, DO;  Location: [facility] ENDOSCOPY;  Service: Gastroenterology;  Laterality: N/A;   ESOPHAGOGASTRODUODENOSCOPY (EGD) WITH PROPOFOL N/A 12/19/2022   Procedure: ESOPHAGOGASTRODUODENOSCOPY (EGD) WITH PROPOFOL;  Surgeon: Jaynie Collins, DO;  Location: [facility] ENDOSCOPY;  Service: Gastroenterology;  Laterality: N/A;   KNEE SURGERY     left wrist ganglion cyst removal     OOPHORECTOMY   1994   PERIPHERAL VASCULAR INTERVENTION Left 08/06/2017   Procedure: PERIPHERAL VASCULAR INTERVENTION;  Surgeon: Fransisco Hertz, MD;  Location: [facility] INVASIVE CV LAB;  Service: Cardiovascular;  Laterality: Left;  subclavian   POLYPECTOMY  12/19/2022   Procedure: POLYPECTOMY;  Surgeon: Jaynie Collins, DO;  Location: [facility] ENDOSCOPY;  Service: Gastroenterology;;   RIGHT/LEFT HEART CATH AND CORONARY ANGIOGRAPHY N/A 03/26/2023   Procedure: RIGHT/LEFT HEART CATH AND CORONARY ANGIOGRAPHY;  Surgeon: Marykay Lex, MD;  Location: [facility] INVASIVE CV LAB;  Service: Cardiovascular;  Laterality: N/A;   TUBAL LIGATION      Prior to Admission medications   Medication Sig Start Date End Date Taking? Authorizing Provider  acetaminophen (TYLENOL) 500 MG tablet Take 1,000 mg by mouth every 6 (six) hours as needed for moderate pain or headache.   Yes [provider]  albuterol (VENTOLIN HFA) 108 (90 Base) MCG/ACT inhaler Inhale 2 puffs into the lungs every 6 (six) hours as needed for wheezing or shortness of breath. 03/28/23  Yes Enedina Finner, MD  ALPRAZolam Prudy Feeler) 0.5 MG tablet Take 0.5 mg by mouth 2 (two) times daily as needed for anxiety.  01/16/17  Yes [provider]  aspirin 81 MG tablet Take 81 mg by mouth at bedtime.    Yes [provider]  atorvastatin (LIPITOR) 40 MG tablet Take 1 tablet (40 mg total) by mouth at bedtime. 07/14/17  Yes Merrily Brittle, MD  bisoprolol (ZEBETA) 5 MG tablet TAKE 1 TABLET (5 MG TOTAL) BY MOUTH DAILY. 05/25/23  Yes Clarisa Kindred A, FNP  clopidogrel (PLAVIX) 75 MG tablet Take 1 tablet (75 mg total) by mouth daily with breakfast. 06/11/23  Yes Clarisa Kindred A, FNP  empagliflozin (JARDIANCE) 10 MG TABS tablet Take 1 tablet (10 mg total) by mouth daily. 03/29/23  Yes Enedina Finner, MD  ferrous sulfate 325 (65 FE) MG tablet Take 1 tablet (325 mg total) by mouth daily with breakfast. 03/29/23  Yes Enedina Finner, MD  gabapentin (NEURONTIN) 100 MG capsule Take 200 mg  by mouth at bedtime.   Yes [provider]  levothyroxine (SYNTHROID, LEVOTHROID) 125 MCG tablet Take 125 mcg by mouth daily.    Yes [provider]  ondansetron (ZOFRAN) 4 MG tablet Take 4 mg by mouth every 4 (four) hours as needed for nausea or vomiting.   Yes [provider]  pantoprazole (PROTONIX) 40 MG tablet Take 40 mg by mouth 2 (two) times daily.   Yes [provider]  potassium chloride SA (KLOR-CON M) 20 MEQ tablet Take 2 tablets (40 mEq total) by mouth daily. For 2 days only 06/30/23  Yes Hackney, Tina A, FNP  spironolactone (ALDACTONE) 25 MG tablet TAKE 1 TABLET (25 MG TOTAL) BY MOUTH DAILY. 05/25/23  Yes Clarisa Kindred A, FNP  torsemide (DEMADEX) 20 MG tablet 20mg  daily AND take  an additional 20 MG if needed for weight gain or swelling. Patient taking differently: Take 40 mg by mouth 2 (two) times daily. 20mg  daily AND take an additional 20 MG if needed for weight gain or swelling. 05/18/23  Yes Clarisa Kindred A, FNP  traZODone (DESYREL) 100 MG tablet Take 100 mg by mouth daily as needed for sleep. 08/31/20  Yes [provider]  ACCU-CHEK GUIDE TEST test strip  04/05/23   [provider]  Accu-Chek Softclix Lancets lancets SMARTSIG:Lancet Topical 04/05/23   [provider]  ezetimibe (ZETIA) 10 MG tablet TAKE 1 TABLET BY MOUTH EVERY DAY Patient not taking: Reported on 07/07/2023 05/25/23   Delma Freeze, FNP  guaiFENesin-dextromethorphan (ROBITUSSIN DM) 100-10 MG/5ML syrup Take 10 mLs by mouth every 4 (four) hours as needed for cough. 09/21/20   Regalado, Belkys A, MD  metolazone (ZAROXOLYN) 2.5 MG tablet Take 1 tablet (2.5 mg total) by mouth every other day for 2 doses. Patient not taking: Reported on 07/07/2023 06/29/23 07/02/23  Clarisa Kindred A, FNP  OXYGEN Inhale 2 L/L into the lungs at bedtime.    [provider]    Family History  Problem Relation Age of Onset   Uterine cancer Mother    Stroke Mother    Aneurysm  Mother    Heart attack Father    Coronary artery disease Father    Leukemia Father    Depression Father    Diabetes Father    Hyperlipidemia Father    Hypertension Father    Colon polyps Sister    Obesity Sister    Thyroid disease Sister    COPD Sister    Lung cancer Sister    Breast cancer Maternal Aunt    Breast cancer Maternal Aunt      Social History   Tobacco Use   Smoking status: Every Day    Current packs/day: 2.00    Average packs/day: 2.0 packs/day for 50.0 years (100.0 ttl pk-yrs)    Types: Cigarettes   Smokeless tobacco: Never  Vaping Use   Vaping status: Former   Substances: Nicotine  Substance Use Topics   Alcohol use: Yes   Drug use: Not Currently    Allergies as of 07/06/2023 - Review Complete 07/06/2023  Allergen Reaction Noted   Hydrocodone-acetaminophen Hives 07/15/2017   Vicodin [hydrocodone-acetaminophen] Hives 04/02/2012   Trelegy ellipta [fluticasone-umeclidin-vilant] Rash 07/15/2022    Review of Systems:    All systems reviewed and negative except where noted in HPI.   Physical Exam:  Vital signs in last 24 hours: Temp:  [97.8 F (36.6 C)-98.3 F (36.8 C)] 98 F (36.7 C) (03/18 1950) Pulse Rate:  [57-66] 65 (03/18 1950) Resp:  [9-20] 17 (03/18 1950) BP: (95-136)/(39-71) 136/43 (03/18 1950) SpO2:  [86 %-100 %] 96 % (03/18 1950) Weight:  [73.1 kg] 73.1 kg (03/17 2200) Last BM Date : 07/06/23 General:   Pleasant, cooperative in NAD Head:  Normocephalic and atraumatic. Eyes:   No icterus.   Conjunctiva pink. PERRLA. Ears:  Normal auditory acuity. Neck:  Supple; no masses or thyroidomegaly Lungs: Respirations even and unlabored. Lungs clear to auscultation bilaterally.   No wheezes, crackles, or rhonchi.  Heart:  Regular rate and rhythm;  Without murmur, clicks, rubs or gallops Abdomen:  Soft, nondistended, nontender. Normal bowel sounds. No appreciable masses or hepatomegaly.  No rebound or guarding.  Rectal:  Not performed. Msk:   Symmetrical without gross deformities.    Extremities:  Without edema, cyanosis or clubbing. Neurologic:  Alert and oriented x3;  grossly normal neurologically. Skin:  Intact without significant lesions or rashes. Cervical Nodes:  No significant cervical adenopathy. Psych:  Alert and cooperative. Normal affect.  LAB RESULTS: Recent Labs    07/06/23 2211 07/07/23 0207  WBC 11.7* 12.2*  HGB 7.5* 6.6*  HCT 25.9* 22.7*  PLT 371 350   BMET Recent Labs    07/06/23 2211 07/07/23 0207  NA 134* 132*  K 2.4* 2.9*  CL 94* 99  CO2 27 23  GLUCOSE 137* 267*  BUN 69* 63*  CREATININE 2.15* 2.07*  CALCIUM 9.4 8.4*   LFT Recent Labs    07/06/23 2211  PROT 6.6  ALBUMIN 3.5  AST 10*  ALT 9  ALKPHOS 55  BILITOT 0.6   PT/INR No results for input(s): "LABPROT", "INR" in the last 72 hours.  STUDIES: CT Lumbar Spine Wo Contrast Result Date: 07/06/2023 CLINICAL DATA:  Fall injury with low back pain. EXAM: CT LUMBAR SPINE WITHOUT CONTRAST TECHNIQUE: Multidetector CT imaging of the lumbar spine was performed without intravenous contrast administration. Multiplanar CT image reconstructions were also generated. RADIATION DOSE REDUCTION: This exam was performed according to the departmental dose-optimization program which includes automated exposure control, adjustment of the mA and/or kV according to patient size and/or use of iterative reconstruction technique. COMPARISON:  CT abdomen pelvis without contrast 10/16/2022. FINDINGS: Segmentation: Standard. Alignment: There is a mild chronic levoscoliosis, and mild chronic grade 1 L5-S1 anterolisthesis, the latter likely related to facet hypertrophy. No other AP listhesis is seen. Vertebrae: No acute fracture is evident or focal pathologic process. The bones are mildly demineralized. The SI joints are patent with vacuum phenomenon and mild spurring. No sacral insufficiency fracture is seen. Paraspinal and other soft tissues: Moderate to heavy  aortoiliac and visceral branch vessel atherosclerosis. Stable 3 cm fusiform infrarenal AAA. Stable left adrenal adenomas. No paraspinal hematoma or mass. Disc levels: The lumbar discs are preserved in heights. There is a left far lateral disc bulge L2-3 but no significant posterior bulge and no herniation or stenosis. At L3-4 there is a mild diffuse annular bulge but no herniation or canal stenosis. There is mild facet spurring without foraminal stenosis. At L4-5, there is a mild left paracentral disc bulge without herniation or stenosis. there Is mild facet hypertrophy without foraminal compromise. At L5-S1 there is advanced left and moderate right facet hypertrophy, grade 1 spondylolisthesis. There is mild posterior disc bulge without herniation or canal stenosis. The foramina are mildly stenotic. IMPRESSION: 1. Osteopenia, scoliosis and degenerative changes without evidence of fracture. 2. Grade 1 degenerative anterolisthesis chronically at L5-S1. 3. Multilevel disc bulges with herniation or canal stenosis. 4. Mild foraminal stenosis L5-S1. 5. Pole aortic atherosclerosis with stable 3 cm infrarenal AAA. Electronically Signed   By: Almira Bar M.D.   On: 07/06/2023 23:24   CT Head Wo Contrast Result Date: 07/06/2023 CLINICAL DATA:  Fall injury with head trauma and low back pain. EXAM: CT HEAD WITHOUT CONTRAST TECHNIQUE: Contiguous axial images were obtained from the base of the skull through the vertex without intravenous contrast. RADIATION DOSE REDUCTION: This exam was performed according to the departmental dose-optimization program which includes automated exposure control, adjustment of the mA and/or kV according to patient size and/or use of iterative reconstruction technique. COMPARISON:  MRI brain 04/24/2022. FINDINGS: Brain: There is mild cerebral atrophy and small vessel disease with unremarkable cerebellum and brainstem. No acute cortical based infarct, hemorrhage, mass, mass effect or midline  shift are seen. The ventricles  are normal in size and position. There are tiny chronic lacunar infarcts in both external capsules. Basal cisterns are patent. Vascular: There are scattered calcifications in both siphons. No hyperdense central vessel is seen. Skull: Negative for fractures or focal lesions. Sinuses/Orbits: No acute findings. Clear sinuses and mastoids. Negative orbits. Other: None. IMPRESSION: No acute intracranial CT findings, depressed skull fractures or interval changes. Chronic changes. Electronically Signed   By: Almira Bar M.D.   On: 07/06/2023 23:00      Impression / Plan:   Assessment: Principal Problem:   Syncope and collapse Active Problems:   Acute renal failure with acute tubular necrosis superimposed on stage 3b chronic kidney disease (HCC)   Chronic obstructive pulmonary disease (HCC)   Essential hypertension   Acute on chronic anemia   Chronic heart failure with preserved ejection fraction (HFpEF) (HCC)   Acute hypokalemia   CAD in native artery   Hypotension due to medication   Dependence on nocturnal oxygen therapy   Fall at home, initial encounter   VEGA STARE is a 68 y.o. y/o female with iron deficient anemia with what appears to be acute on chronic anemia.  The patient's hemoglobin a few months ago was 9.3 that came down to 7.5 on admission and is now down to 6.6.  The patient has had multiple EGDs and colonoscopies and no source for the patient's anemia has been found.  The patient was recommended to undergo a capsule endoscopy which did not get done and the patient is now on stool antiplatelet therapy for a recent cardiac catheterization with stent placement.  Plan:  The patient will be set up for a capsule endoscopy for tomorrow.  The patient should be transfused as needed.  The patient has been explained the risks and benefits of the procedure and agreed to proceed with the procedure.  PPI IV twice daily  Continue serial CBCs and transfuse  PRN Avoid NSAIDs Maintain 2 large-bore IV lines Please page GI with any acute hemodynamic changes, or signs of active GI bleeding   Thank you for involving me in the care of this patient.      LOS: 0 days   Misty Minium, MD, MD. Clementeen Graham 07/07/2023, 8:47 PM,  Pager [phone] 7am-5pm  Check AMION for 5pm -7am coverage and on weekends   Note: This dictation was prepared with Dragon dictation along with smaller phrase technology. Any transcriptional errors that result from this process are unintentional.

## 2023-07-07 NOTE — Assessment & Plan Note (Addendum)
 On nocturnal oxygen Not acutely exacerbated Continue home inhalers Continue supplemental nighttime oxygen

## 2023-07-07 NOTE — Assessment & Plan Note (Signed)
 No complaints of chest pain Continue aspirin, atorvastatin.  Holding bisoprolol due to hypotension

## 2023-07-07 NOTE — Assessment & Plan Note (Signed)
 Holding all diuretics and bisoprolol due to hypotension with syncope Patient recently given a couple doses of metolazone due to fluid overload Currently clinically euvolemic Monitor for fluid overload as diuretics being held Daily weights

## 2023-07-07 NOTE — Assessment & Plan Note (Signed)
 Hemoglobin 7.5, down from 9.3 a couple months prior States that she has had multiple upper and lower endoscopies but never got around to doing capsule endoscopy Serial H&H and transfuse if needed-patient consented i blood as needed Will consult GI for recommendations on whether further inpatient workup is needed versus outpatient

## 2023-07-07 NOTE — Progress Notes (Signed)
 PT Cancellation Note  Patient Details Name: Misty Adams MRN: [id number] DOB: December 27, 1955   Cancelled Treatment:    Reason Eval/Treat Not Completed: Medical issues which prohibited therapy Orders received, chart reviewed. Patient currently receiving blood transfusion with hgb 6.6. Will re-attempt at later date/time as medically appropriate.   Maylon Peppers, PT, DPT Physical Therapist - [place]  [facility]    Selinda Korzeniewski A Adonai Selsor 07/07/2023, 1:57 PM

## 2023-07-07 NOTE — ED Notes (Signed)
 This RN gave report to Leonette Nutting RN via phone call. Pt updated on plan of care.

## 2023-07-07 NOTE — Progress Notes (Addendum)
 Progress Note    AYLEEN MCKINSTRY  ZOX:[id number] DOB: 06-10-1955  DOA: 07/06/2023 PCP: Danella Penton, MD      Brief Narrative:    Medical records reviewed and are as summarized below:  Misty Adams is a 68 y.o. female with medical history significant for chronic HFpEF, COPD, chronic hypoxic respiratory failure on nocturnal O2 2L at baseline, HTN, CKD stage IIIb, chronic IDA, hypothyroidism, PVD  and CAD, who presented to the hospital with lightheadedness and multiple presyncopal episodes in the preceding 24 hours to lead to a fall on her lower back. She said the EMS came by and checked on her and just after they left she had a full syncopal episode when she passed out.  She did not have any palpitations, chest pain, shortness of breath, headache, visual disturbance.  No vomiting, diarrhea, bloody stools or hematemesis. He was seen at the heart failure clinic on 06/29/2023 and metolazone 2.5 mg every other day for 2 days was added to her torsemide regimen because she was noted to have some fluid overload. Notably, she was hypotensive in the ED with BP of 95/45.       Assessment/Plan:   Principal Problem:   Syncope and collapse Active Problems:   Chronic heart failure with preserved ejection fraction (HFpEF) (HCC)   Hypotension due to medication   Acute on chronic anemia   Acute hypokalemia   Acute renal failure with acute tubular necrosis superimposed on stage 3b chronic kidney disease (HCC)   Fall at home, initial encounter   Chronic obstructive pulmonary disease (HCC)   Dependence on nocturnal oxygen therapy   Essential hypertension   CAD in native artery    Body mass index is 27.67 kg/m.   S/p syncope and collapse: Probably due to hypotension and worsening anemia.  Monitor on telemetry.   Hypotension: Probably from diuretics BP improved s/p normal saline 1.5 L bolus  She was on bisoprolol, spironolactone, torsemide and metolazone prior to  admission.   Acute on chronic anemia, iron deficiency anemia: Hemoglobin from 7.5-6.6.  Transfuse 1 units of PRBCs.  Gastritis, benefits and alternatives to blood transfusion.  Patient is agreeable to blood transfusion.  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 Consider IV iron infusion as well. Iron studies showed ferritin 5, iron 8, TIBC 382, saturation ratio 2.   Hypokalemia: Potassium up from 2.4-2.9.  Continue potassium repletion and monitor levels.   Probable AKI on CKD stage IIIb: Creatinine has been fluctuating.  Tylenol.  Monitor BMP.   Chronic diastolic CHF: Compensated.  Hold diuretics for now. 2D echo in November 2024 showed EF estimated at 66 5%, grade 2 diastolic dysfunction, mild aortic valve stenosis    S/p fall at home after syncope, acute on chronic low back pain: No acute fractures noted. CT lumbar spine showed osteopenia, scoliosis and degenerative changes without evidence of fracture.  There is also evidence of grade 1 degenerative anterolisthesis at L5-S1, multilevel disc bulges with herniation or canal stenosis, moderate foraminal stenosis L5-S1, aortic atherosclerosis with stable infrarenal AAA   Comorbidities include hypertension, CAD, COPD, chronic hypoxic respiratory failure on nocturnal oxygen,  Diet Order             Diet NPO time specified Except for: Sips with Meds, Ice Chips  Diet effective now  Consultants: None  Procedures: None    Medications:    atorvastatin  40 mg Oral QHS   levothyroxine  125 mcg Oral Q0600   pantoprazole  40 mg Oral BID   potassium chloride SA  40 mEq Oral Daily   sodium chloride flush  3 mL Intravenous Q12H   Continuous Infusions:   Anti-infectives (From admission, onward)    None              Family Communication/Anticipated D/C date and plan/Code Status   DVT prophylaxis: SCDs Start: 07/07/23 0202     Code  Status: Full Code  Family Communication: None Disposition Plan: Plan to discharge home   Status is: Observation The patient will require care spanning > 2 midnights and should be moved to inpatient because: Hypokalemia, severe anemia       Subjective:   Interval events noted.  She complains of general weakness, dizziness and shortness of breath with minimal exertion.  No vomiting, abdominal pain, diarrhea or bloody stools.    Objective:    Vitals:   07/07/23 0321 07/07/23 0557 07/07/23 0609 07/07/23 0623  BP: 121/62 (!) 112/44  (!) 115/44  Pulse: (!) 57  63 61  Resp: 18   18  Temp:  97.8 F (36.6 C)  97.8 F (36.6 C)  TempSrc:  Oral  Oral  SpO2: 96%   96%  Weight:      Height:       No data found.   Intake/Output Summary (Last 24 hours) at 07/07/2023 3244 Last data filed at 07/07/2023 0217 Gross per 24 hour  Intake 3 ml  Output --  Net 3 ml   Filed Weights   07/06/23 2200  Weight: 73.1 kg    Exam:  GEN: NAD SKIN: Warm and dry EYES: Pale, anicteric ENT: MMM CV: RRR PULM: CTA B ABD: soft, ND, NT, +BS CNS: AAO x 3, non focal EXT: No edema or tenderness MSK: Lumbar spinal tenderness       Data Reviewed:   I have personally reviewed following labs and imaging studies:  Labs: Labs show the following:   Basic Metabolic Panel: Recent Labs  Lab 07/06/23 2211 07/07/23 0207  NA 134* 132*  K 2.4* 2.9*  CL 94* 99  CO2 27 23  GLUCOSE 137* 267*  BUN 69* 63*  CREATININE 2.15* 2.07*  CALCIUM 9.4 8.4*  MG 2.3  --    GFR Estimated Creatinine Clearance: 24.8 mL/min (A) (by C-G formula based on SCr of 2.07 mg/dL (H)). Liver Function Tests: Recent Labs  Lab 07/06/23 2211  AST 10*  ALT 9  ALKPHOS 55  BILITOT 0.6  PROT 6.6  ALBUMIN 3.5   No results for input(s): "LIPASE", "AMYLASE" in the last 168 hours. No results for input(s): "AMMONIA" in the last 168 hours. Coagulation profile No results for input(s): "INR", "PROTIME" in the last 168  hours.  CBC: Recent Labs  Lab 07/06/23 2211 07/07/23 0207  WBC 11.7* 12.2*  NEUTROABS 10.2*  --   HGB 7.5* 6.6*  HCT 25.9* 22.7*  MCV 84.1 84.1  PLT 371 350   Cardiac Enzymes: No results for input(s): "CKTOTAL", "CKMB", "CKMBINDEX", "TROPONINI" in the last 168 hours. BNP (last 3 results) No results for input(s): "PROBNP" in the last 8760 hours. CBG: Recent Labs  Lab 07/07/23 0627  GLUCAP 156*   D-Dimer: No results for input(s): "DDIMER" in the last 72 hours. Hgb A1c: No results for input(s): "HGBA1C" in the last 72 hours. Lipid  Profile: No results for input(s): "CHOL", "HDL", "LDLCALC", "TRIG", "CHOLHDL", "LDLDIRECT" in the last 72 hours. Thyroid function studies: No results for input(s): "TSH", "T4TOTAL", "T3FREE", "THYROIDAB" in the last 72 hours.  Invalid input(s): "FREET3" Anemia work up: No results for input(s): "VITAMINB12", "FOLATE", "FERRITIN", "TIBC", "IRON", "RETICCTPCT" in the last 72 hours. Sepsis Labs: Recent Labs  Lab 07/06/23 2211 07/07/23 0207  WBC 11.7* 12.2*  LATICACIDVEN 0.9  --     Microbiology Recent Results (from the past 240 hours)  Resp panel by RT-PCR (RSV, Flu A&B, Covid) Anterior Nasal Swab     Status: None   Collection Time: 07/06/23 10:11 PM   Specimen: Anterior Nasal Swab  Result Value Ref Range Status   SARS Coronavirus 2 by RT PCR NEGATIVE NEGATIVE Final    Comment: (NOTE) SARS-CoV-2 target nucleic acids are NOT DETECTED.  The SARS-CoV-2 RNA is generally detectable in upper respiratory specimens during the acute phase of infection. The lowest concentration of SARS-CoV-2 viral copies this assay can detect is 138 copies/mL. A negative result does not preclude SARS-Cov-2 infection and should not be used as the sole basis for treatment or other patient management decisions. A negative result may occur with  improper specimen collection/handling, submission of specimen other than nasopharyngeal swab, presence of viral  mutation(s) within the areas targeted by this assay, and inadequate number of viral copies(<138 copies/mL). A negative result must be combined with clinical observations, patient history, and epidemiological information. The expected result is Negative.  Fact Sheet for Patients:  BloggerCourse.com  Fact Sheet for Healthcare Providers:  SeriousBroker.it  This test is no t yet approved or cleared by the [place] FDA and  has been authorized for detection and/or diagnosis of SARS-CoV-2 by FDA under an Emergency Use Authorization (EUA). This EUA will remain  in effect (meaning this test can be used) for the duration of the COVID-19 declaration under Section 564(b)(1) of the Act, 21 U.S.C.section 360bbb-3(b)(1), unless the authorization is terminated  or revoked sooner.       Influenza A by PCR NEGATIVE NEGATIVE Final   Influenza B by PCR NEGATIVE NEGATIVE Final    Comment: (NOTE) The Xpert Xpress SARS-CoV-2/FLU/RSV plus assay is intended as an aid in the diagnosis of influenza from Nasopharyngeal swab specimens and should not be used as a sole basis for treatment. Nasal washings and aspirates are unacceptable for Xpert Xpress SARS-CoV-2/FLU/RSV testing.  Fact Sheet for Patients: BloggerCourse.com  Fact Sheet for Healthcare Providers: SeriousBroker.it  This test is not yet approved or cleared by the [place] FDA and has been authorized for detection and/or diagnosis of SARS-CoV-2 by FDA under an Emergency Use Authorization (EUA). This EUA will remain in effect (meaning this test can be used) for the duration of the COVID-19 declaration under Section 564(b)(1) of the Act, 21 U.S.C. section 360bbb-3(b)(1), unless the authorization is terminated or revoked.     Resp Syncytial Virus by PCR NEGATIVE NEGATIVE Final    Comment: (NOTE) Fact Sheet for  Patients: BloggerCourse.com  Fact Sheet for Healthcare Providers: SeriousBroker.it  This test is not yet approved or cleared by the [place] FDA and has been authorized for detection and/or diagnosis of SARS-CoV-2 by FDA under an Emergency Use Authorization (EUA). This EUA will remain in effect (meaning this test can be used) for the duration of the COVID-19 declaration under Section 564(b)(1) of the Act, 21 U.S.C. section 360bbb-3(b)(1), unless the authorization is terminated or revoked.  Performed at [facility], [address]  [place], [place] [address]     Procedures and diagnostic studies:  CT Lumbar Spine Wo Contrast Result Date: 07/06/2023 CLINICAL DATA:  Fall injury with low back pain. EXAM: CT LUMBAR SPINE WITHOUT CONTRAST TECHNIQUE: Multidetector CT imaging of the lumbar spine was performed without intravenous contrast administration. Multiplanar CT image reconstructions were also generated. RADIATION DOSE REDUCTION: This exam was performed according to the departmental dose-optimization program which includes automated exposure control, adjustment of the mA and/or kV according to patient size and/or use of iterative reconstruction technique. COMPARISON:  CT abdomen pelvis without contrast 10/16/2022. FINDINGS: Segmentation: Standard. Alignment: There is a mild chronic levoscoliosis, and mild chronic grade 1 L5-S1 anterolisthesis, the latter likely related to facet hypertrophy. No other AP listhesis is seen. Vertebrae: No acute fracture is evident or focal pathologic process. The bones are mildly demineralized. The SI joints are patent with vacuum phenomenon and mild spurring. No sacral insufficiency fracture is seen. Paraspinal and other soft tissues: Moderate to heavy aortoiliac and visceral branch vessel atherosclerosis. Stable 3 cm fusiform infrarenal AAA. Stable left adrenal adenomas. No paraspinal hematoma or  mass. Disc levels: The lumbar discs are preserved in heights. There is a left far lateral disc bulge L2-3 but no significant posterior bulge and no herniation or stenosis. At L3-4 there is a mild diffuse annular bulge but no herniation or canal stenosis. There is mild facet spurring without foraminal stenosis. At L4-5, there is a mild left paracentral disc bulge without herniation or stenosis. there Is mild facet hypertrophy without foraminal compromise. At L5-S1 there is advanced left and moderate right facet hypertrophy, grade 1 spondylolisthesis. There is mild posterior disc bulge without herniation or canal stenosis. The foramina are mildly stenotic. IMPRESSION: 1. Osteopenia, scoliosis and degenerative changes without evidence of fracture. 2. Grade 1 degenerative anterolisthesis chronically at L5-S1. 3. Multilevel disc bulges with herniation or canal stenosis. 4. Mild foraminal stenosis L5-S1. 5. Pole aortic atherosclerosis with stable 3 cm infrarenal AAA. Electronically Signed   By: Almira Bar M.D.   On: 07/06/2023 23:24   CT Head Wo Contrast Result Date: 07/06/2023 CLINICAL DATA:  Fall injury with head trauma and low back pain. EXAM: CT HEAD WITHOUT CONTRAST TECHNIQUE: Contiguous axial images were obtained from the base of the skull through the vertex without intravenous contrast. RADIATION DOSE REDUCTION: This exam was performed according to the departmental dose-optimization program which includes automated exposure control, adjustment of the mA and/or kV according to patient size and/or use of iterative reconstruction technique. COMPARISON:  MRI brain 04/24/2022. FINDINGS: Brain: There is mild cerebral atrophy and small vessel disease with unremarkable cerebellum and brainstem. No acute cortical based infarct, hemorrhage, mass, mass effect or midline shift are seen. The ventricles are normal in size and position. There are tiny chronic lacunar infarcts in both external capsules. Basal cisterns are  patent. Vascular: There are scattered calcifications in both siphons. No hyperdense central vessel is seen. Skull: Negative for fractures or focal lesions. Sinuses/Orbits: No acute findings. Clear sinuses and mastoids. Negative orbits. Other: None. IMPRESSION: No acute intracranial CT findings, depressed skull fractures or interval changes. Chronic changes. Electronically Signed   By: Almira Bar M.D.   On: 07/06/2023 23:00               LOS: 0 days   Linsey Hirota  [facility]   Pager on [email or web address]. If 7PM-7AM, please contact night-coverage at [email or web address]     07/07/2023, 9:22 AM

## 2023-07-07 NOTE — Assessment & Plan Note (Signed)
 Likely multifactorial and related to symptomatic anemia, medication associated hypotension, hypokalemia Will treat each etiology as outlined on the specific problem Continuous cardiac monitoring Orthostatic vital signs Echocardiogram Neurologic checks with fall and aspiration precautions

## 2023-07-07 NOTE — TOC Initial Note (Signed)
 Transition of Care [facility]) - Initial/Assessment Note    Patient Details  Name: Misty Adams MRN: [id number] Date of Birth: 10/31/1955  [facility] CM/SW Contact:    Colin Broach, LCSW Phone Number: 07/07/2023, 8:10 PM  Clinical Narrative:                 CSW met with patient at bedside.  Demographic and insurance information verified.  Patient reports that her PCP is Lynda Rainwater, MD and [facility] needs.  She has no concerns with getting her medications.  Patient states that she lives with her adult children.  She has the following DME:  [employer] [place], walker and cane.  At discharge, one of her children will transport her home.  CM will continue to follow for discharge needs that may arise.         Patient Goals and CMS Choice            Expected Discharge Plan and Services                                              Prior Living Arrangements/Services                       Activities of Daily Living   ADL Screening (condition at time of admission) Independently performs ADLs?: Yes (appropriate for developmental age) Is the patient deaf or have difficulty hearing?: No Does the patient have difficulty seeing, even when wearing glasses/contacts?: No Does the patient have difficulty concentrating, remembering, or making decisions?: No  Permission Sought/Granted                  Emotional Assessment              Admission diagnosis:  Syncope and collapse [R55] Acute on chronic anemia [D64.9] Patient Active Problem List   Diagnosis Date Noted   Hypotension due to medication 07/06/2023   Dependence on nocturnal oxygen therapy 07/06/2023   Syncope and collapse 07/06/2023   Fall at home, initial encounter 07/06/2023   Demand ischemia (HCC) 03/26/2023   CAD in native artery 03/26/2023   Pulmonary hypertension, unspecified (HCC) 03/25/2023   Acute on chronic respiratory failure with hypoxemia (HCC) 03/24/2023    Chronic kidney disease, stage 3b (HCC) 03/24/2023   CHF (congestive heart failure) (HCC) 03/23/2023   Acute hypokalemia 03/10/2023   Acute respiratory failure with hypoxia (HCC) 03/09/2023   Chronic heart failure with preserved ejection fraction (HFpEF) (HCC) 03/09/2023   Dyslipidemia 03/09/2023   GERD without esophagitis 03/09/2023   Acute kidney injury superimposed on chronic kidney disease (HCC) 03/09/2023   Acute on chronic anemia 12/08/2022   Severe sepsis (HCC) 07/16/2022   Acute pyelonephritis 07/16/2022   Lactic acidosis 07/16/2022   Thrombocytopenia (HCC) 07/16/2022   Essential hypertension 07/16/2022   Hyperlipidemia, unspecified 07/16/2022   Hypothyroidism 07/16/2022   Anxiety and depression 07/16/2022   Acute renal failure with acute tubular necrosis superimposed on stage 3b chronic kidney disease (HCC) 07/14/2022   Chronic obstructive pulmonary disease (HCC) 07/14/2022   Pneumonia 09/21/2020   Pneumonia due to COVID-19 virus 09/20/2020   Chronic venous insufficiency 09/16/2017   Subclavian artery stenosis, left (HCC) 07/15/2017   PCP:  Danella Penton, MD Pharmacy:   [id number] [place],  - [address]  [address] [place] [address] Phone: [phone] Fax: [phone]  [facility] [address] [place] [address] Phone: [phone] Fax: [phone]     Social Drivers of Health (SDOH) Social History: SDOH Screenings   Food Insecurity: No Food Insecurity (07/07/2023)  Recent Concern: Food Insecurity - Food Insecurity Present (05/07/2023)   Received from [facility]  Housing: Low Risk  (07/07/2023)  Transportation Needs: No Transportation Needs (07/07/2023)  Utilities: Not At Risk (07/07/2023)  Alcohol Screen: Low Risk  (03/24/2023)  Depression (PHQ2-9): Low Risk  (12/08/2022)  Financial Resource Strain: High Risk (05/07/2023)   Received from [facility]  Social Connections: Moderately Isolated (07/07/2023)  Tobacco Use: High Risk (07/07/2023)   SDOH Interventions:     Readmission Risk Interventions    07/07/2023    8:10 PM 03/23/2023    3:59 PM  Readmission Risk Prevention Plan  Transportation Screening Complete Complete  PCP or Specialist Appt within 3-5 Days Complete Complete  HRI or Home Care Consult Complete Complete  Social Work Consult for Recovery Care Planning/Counseling Complete Complete  Palliative Care Screening Not Applicable Not Applicable  Medication Review Oceanographer) Complete --

## 2023-07-07 NOTE — Assessment & Plan Note (Deleted)
 Likely multifactorial and related to symptomatic anemia, medication associated hypotension, hypokalemia Will treat each etiology as outlined on the specific problem Continuous cardiac monitoring Orthostatic vital signs Echocardiogram Neurologic checks with fall and aspiration precautions

## 2023-07-07 NOTE — Assessment & Plan Note (Signed)
 CT head and CT LS spine nonacute Pain control Physical therapy eval

## 2023-07-07 NOTE — Assessment & Plan Note (Signed)
 Likely ATN caused by hypotension with renal hypoperfusion Gentle rehab duration and monitor

## 2023-07-07 NOTE — Assessment & Plan Note (Signed)
 Hold home antihypertensives due to hypotension

## 2023-07-07 NOTE — Assessment & Plan Note (Signed)
 Patient is on multiple diuretics including torsemide 40 mg twice daily, spironolactone and was recently placed on Zaroxolyn for 3 days.  Also on bisoprolol Will hold all diuretics and antihypertensives Monitor blood pressure, and resume meds sequentially if tolerated

## 2023-07-07 NOTE — Assessment & Plan Note (Signed)
 Hemoglobin 2.4 likely secondary to diuretic treatment Holding all diuretics IV and oral repletion and monitor

## 2023-07-07 NOTE — ED Notes (Signed)
 Pt c/o burning with IV potassium. Para March MD made aware. See new orders.

## 2023-07-08 ENCOUNTER — Telehealth: Payer: Self-pay

## 2023-07-08 ENCOUNTER — Encounter
Admission: EM | Disposition: A | Discharge status: Home / Self Care | Payer: Self-pay | Source: Home / Self Care | Attending: Internal Medicine

## 2023-07-08 DIAGNOSIS — R55 Syncope and collapse: Secondary | ICD-10-CM | POA: Diagnosis not present

## 2023-07-08 LAB — BASIC METABOLIC PANEL
Anion gap: 5 (ref 5–15)
BUN: 44 mg/dL — ABNORMAL HIGH (ref 8–23)
CO2: 25 mmol/L (ref 22–32)
Calcium: 8.8 mg/dL — ABNORMAL LOW (ref 8.9–10.3)
Chloride: 112 mmol/L — ABNORMAL HIGH (ref 98–111)
Creatinine, Ser: 1.59 mg/dL — ABNORMAL HIGH (ref 0.44–1.00)
GFR, Estimated: 35 mL/min — ABNORMAL LOW (ref >60)
Glucose, Bld: 97 mg/dL (ref 70–99)
Potassium: 4.4 mmol/L (ref 3.5–5.1)
Sodium: 142 mmol/L (ref 135–145)

## 2023-07-08 LAB — TYPE AND SCREEN
ABO/RH(D): O POS
Antibody Screen: NEGATIVE
Unit division: 0

## 2023-07-08 LAB — CBC WITH DIFFERENTIAL/PLATELET
Abs Immature Granulocytes: 0.04 10*3/uL (ref 0.00–0.07)
Basophils Absolute: 0 10*3/uL (ref 0.0–0.1)
Basophils Relative: 1 %
Eosinophils Absolute: 0.2 10*3/uL (ref 0.0–0.5)
Eosinophils Relative: 3 %
HCT: 24.1 % — ABNORMAL LOW (ref 36.0–46.0)
Hemoglobin: 7.2 g/dL — ABNORMAL LOW (ref 12.0–15.0)
Immature Granulocytes: 1 %
Lymphocytes Relative: 25 %
Lymphs Abs: 1.8 10*3/uL (ref 0.7–4.0)
MCH: 25.3 pg — ABNORMAL LOW (ref 26.0–34.0)
MCHC: 29.9 g/dL — ABNORMAL LOW (ref 30.0–36.0)
MCV: 84.6 fL (ref 80.0–100.0)
Monocytes Absolute: 0.6 10*3/uL (ref 0.1–1.0)
Monocytes Relative: 8 %
Neutro Abs: 4.4 10*3/uL (ref 1.7–7.7)
Neutrophils Relative %: 62 %
Platelets: 305 10*3/uL (ref 150–400)
RBC: 2.85 MIL/uL — ABNORMAL LOW (ref 3.87–5.11)
RDW: 16.8 % — ABNORMAL HIGH (ref 11.5–15.5)
WBC: 7.1 10*3/uL (ref 4.0–10.5)
nRBC: 0 % (ref 0.0–0.2)

## 2023-07-08 LAB — MAGNESIUM: Magnesium: 2.5 mg/dL — ABNORMAL HIGH (ref 1.7–2.4)

## 2023-07-08 LAB — BPAM RBC
Blood Product Expiration Date: 202504072359
ISSUE DATE / TIME: 202503181152
Unit Type and Rh: 5100

## 2023-07-08 LAB — HEMOGLOBIN AND HEMATOCRIT, BLOOD
HCT: 26.2 % — ABNORMAL LOW (ref 36.0–46.0)
Hemoglobin: 7.7 g/dL — ABNORMAL LOW (ref 12.0–15.0)

## 2023-07-08 LAB — GLUCOSE, CAPILLARY: Glucose-Capillary: 96 mg/dL (ref 70–99)

## 2023-07-08 SURGERY — IMAGING PROCEDURE, GI TRACT, INTRALUMINAL, VIA CAPSULE

## 2023-07-08 MED ORDER — OXYCODONE-ACETAMINOPHEN 5-325 MG PO TABS
1.0000 | ORAL_TABLET | ORAL | 0 refills | Status: DC | PRN
Start: 2023-07-08 — End: 2024-02-04

## 2023-07-08 MED ORDER — IRON SUCROSE 200 MG IVPB - SIMPLE MED
200.0000 mg | Freq: Once | Status: AC
  Administered 2023-07-08: 200 mg via INTRAVENOUS
  Filled 2023-07-08: qty 200

## 2023-07-08 NOTE — Progress Notes (Signed)
 The patient reported that she was hurting and cannot sleep and wanted to go home and refused a capsule endoscopy today.  The patient has followed up with Dr. Timothy Lasso in the past and was supposed to have the capsule endoscopy as an outpatient.  I recommend the patient follow through with the capsule endoscopy in the future with Dr. Ferd Hibbs office.  Nothing further to do from a GI point of view at this time since the patient is refusing further workup.  I will sign off.  Please call if any further GI concerns or questions.  We would like to thank you for the opportunity to participate in the care of Misty Adams.

## 2023-07-08 NOTE — Evaluation (Signed)
 Physical Therapy Evaluation Patient Details Name: Misty Adams MRN: [id number] DOB: 03-31-1956 Today's Date: 07/08/2023  History of Present Illness  Patient is a 68 year old female with loss of consciousness, anemia, hypotension. History of HFpEF, COPD, chronic hypoxic respiratory failure on nocturnal O2 2L, HTN, CKD stage IIIb, PVD, CAD  Clinical Impression  Patient is agreeable to PT evaluation. She was using rolling walker for ambulation at home.  Today the patient reports pain in the posterior right hip/buttock area. She required no physical assistance with mobility. She was able to stand x 1 bout with fair standing balance. Patient declined walking and reports walking to bathroom several times today already. Encouraged patient to continue using rolling walker for safety with mobility at home. No dizziness reported with mobility. She is eager to be discharge home as soon as possible and declined the need for home PT. Will continue to follow while in the hospital to maximize independence and decrease caregiver burden.       If plan is discharge home, recommend the following: Assist for transportation   Can travel by private vehicle        Equipment Recommendations None recommended by PT  Recommendations for Other Services       Functional Status Assessment Patient has had a recent decline in their functional status and demonstrates the ability to make significant improvements in function in a reasonable and predictable amount of time.     Precautions / Restrictions Precautions Precautions: Fall Restrictions Weight Bearing Restrictions Per Provider Order: No      Mobility  Bed Mobility Overal bed mobility: Needs Assistance Bed Mobility: Supine to Sit     Supine to sit: Supervision     General bed mobility comments: increased time, no physical assistance required    Transfers Overall transfer level: Needs assistance Equipment used: None Transfers: Sit to/from  Stand Sit to Stand: Supervision           General transfer comment: intermittently using the sink for support to stand. no physical assistance required.    Ambulation/Gait               General Gait Details: patient declined, reporting she has been to the bathroom multiple times today  Stairs            Wheelchair Mobility     Tilt Bed    Modified Rankin (Stroke Patients Only)       Balance Overall balance assessment: Needs assistance Sitting-balance support: Feet supported Sitting balance-Leahy Scale: Good     Standing balance support: Single extremity supported Standing balance-Leahy Scale: Fair                               Pertinent Vitals/Pain Pain Assessment Pain Assessment: Faces Faces Pain Scale: Hurts even more Pain Location: right posterior hip/buttocks area Pain Descriptors / Indicators: Discomfort Pain Intervention(s): Monitored during session, Limited activity within patient's tolerance, Repositioned    Home Living Family/patient expects to be discharged to:: Private residence Living Arrangements: Children Available Help at Discharge: Family Type of Home: House Home Access: Level entry       Home Layout: Two level;Able to live on main level with bedroom/bathroom Home Equipment: Shower seat;Grab bars - tub/shower;Rolling Walker (2 wheels)      Prior Function Prior Level of Function : Independent/Modified Independent             Mobility Comments: using rolling walker for walking  Extremity/Trunk Assessment   Upper Extremity Assessment Upper Extremity Assessment: Overall WFL for tasks assessed    Lower Extremity Assessment Lower Extremity Assessment: Generalized weakness       Communication   Communication Communication: No apparent difficulties    Cognition Arousal: Alert Behavior During Therapy: WFL for tasks assessed/performed   PT - Cognitive impairments: No apparent impairments                          Following commands: Intact       Cueing       General Comments General comments (skin integrity, edema, etc.): no dizziness with mobility today. patient is hopeful to go home today if possible. she declined the need for home PT    Exercises     Assessment/Plan    PT Assessment Patient needs continued PT services  PT Problem List Decreased strength;Decreased range of motion;Decreased activity tolerance;Decreased balance;Decreased mobility       PT Treatment Interventions Gait training;DME instruction;Functional mobility training;Therapeutic activities;Balance training;Therapeutic exercise;Cognitive remediation    PT Goals (Current goals can be found in the Care Plan section)  Acute Rehab PT Goals Patient Stated Goal: to go home today PT Goal Formulation: With patient Time For Goal Achievement: 07/22/23 Potential to Achieve Goals: Fair    Frequency Min 1X/week     Co-evaluation               AM-PAC PT "6 Clicks" Mobility  Outcome Measure Help needed turning from your back to your side while in a flat bed without using bedrails?: None Help needed moving from lying on your back to sitting on the side of a flat bed without using bedrails?: A Little Help needed moving to and from a bed to a chair (including a wheelchair)?: A Little Help needed standing up from a chair using your arms (e.g., wheelchair or bedside chair)?: A Little Help needed to walk in hospital room?: A Little Help needed climbing 3-5 steps with a railing? : A Little 6 Click Score: 19    End of Session         PT Visit Diagnosis: Muscle weakness (generalized) (M62.81);Unsteadiness on feet (R26.81)    Time: 1121-1150 PT Time Calculation (min) (ACUTE ONLY): 29 min   Charges:   PT Evaluation $PT Eval Low Complexity: 1 Low   PT General Charges $$ ACUTE PT VISIT: 1 Visit         Donna Bernard, PT, MPT   Ina Homes 07/08/2023, 12:28 PM

## 2023-07-08 NOTE — Plan of Care (Signed)

## 2023-07-08 NOTE — Progress Notes (Signed)
 Explained capsule study and reason for the study. Patient verbalizes understanding but refused the capsule study. States"I am hurting and can't sleep and I am going to ask to go home this morning. They have done all kinds of tests and I am just tired." She will let staff know if she changes her mind before she eats. RN notified, Dr. Servando Snare notified.

## 2023-07-08 NOTE — Progress Notes (Unsigned)
 Pt called stating she has been off of her diuretic for 3 days because she was admitted and taken off of it due to passing out with a low hemoglobin. Pt thinks she may have some fluid on her currently without any presenting sx. Pt prefers not to come into the office if not absolutely necessary. Pt has not weighted recently but states her wt 3 days ago was 161.2 which is 2 lbs higher than her normal/baseline wt. Pt prefers MyChart messaging rather than a phone call.

## 2023-07-08 NOTE — Plan of Care (Signed)
   Problem: Education: Goal: Knowledge of General Education information will improve Description: Including pain rating scale, medication(s)/side effects and non-pharmacologic comfort measures Outcome: Progressing   Problem: Activity: Goal: Risk for activity intolerance will decrease Outcome: Progressing   Problem: Coping: Goal: Level of anxiety will decrease Outcome: Progressing

## 2023-07-08 NOTE — Progress Notes (Signed)
 Reviewed AVS with the patient. She will call to schedule f/u appointments with PCP and GI. Scripts sent electronically, patient confirmed pharmacy.  Charge RN assisted patient to the exit, family to provide transportation.

## 2023-07-08 NOTE — Discharge Summary (Signed)
 Physician Discharge Summary   Patient: Misty Adams MRN: [id number] DOB: 02-11-1956  Admit date:     07/06/2023  Discharge date: {dischdate:26783}  Discharge Physician: Pennie Banter   PCP: Danella Penton, MD   Recommendations at discharge:  {Tip this will not be part of the note when signed- Example include specific recommendations for outpatient follow-up, pending tests to follow-up on. (Optional):26781}  ***  Discharge Diagnoses: Principal Problem:   Syncope and collapse Active Problems:   Chronic heart failure with preserved ejection fraction (HFpEF) (HCC)   Hypotension due to medication   Acute on chronic anemia   Acute hypokalemia   Acute renal failure with acute tubular necrosis superimposed on stage 3b chronic kidney disease (HCC)   Fall at home, initial encounter   Chronic obstructive pulmonary disease (HCC)   Dependence on nocturnal oxygen therapy   Essential hypertension   CAD in native artery   Iron deficiency anemia secondary to blood loss (chronic)  Resolved Problems:   * No resolved hospital problems. *  Hospital Course: No notes on file  Assessment and Plan: * Syncope and collapse Likely multifactorial and related to symptomatic anemia, medication associated hypotension, hypokalemia Will treat each etiology as outlined on the specific problem Continuous cardiac monitoring Orthostatic vital signs Echocardiogram Neurologic checks with fall and aspiration precautions  Hypotension due to medication Patient is on multiple diuretics including torsemide 40 mg twice daily, spironolactone and was recently placed on Zaroxolyn for 3 days.  Also on bisoprolol Will hold all diuretics and antihypertensives Monitor blood pressure, and resume meds sequentially if tolerated  Chronic heart failure with preserved ejection fraction (HFpEF) (HCC) Holding all diuretics and bisoprolol due to hypotension with syncope Patient recently given a couple doses of  metolazone due to fluid overload Currently clinically euvolemic Monitor for fluid overload as diuretics being held Daily weights  Acute hypokalemia Hemoglobin 2.4 likely secondary to diuretic treatment Holding all diuretics IV and oral repletion and monitor  Acute on chronic anemia Hemoglobin 7.5, down from 9.3 a couple months prior States that she has had multiple upper and lower endoscopies but never got around to doing capsule endoscopy Serial H&H and transfuse if needed-patient consented i blood as needed Will consult GI for recommendations on whether further inpatient workup is needed versus outpatient  Fall at home, initial encounter CT head and CT LS spine nonacute Pain control Physical therapy eval  Acute renal failure with acute tubular necrosis superimposed on stage 3b chronic kidney disease (HCC) Likely ATN caused by hypotension with renal hypoperfusion Gentle rehab duration and monitor  Chronic obstructive pulmonary disease (HCC) On nocturnal oxygen Not acutely exacerbated Continue home inhalers Continue supplemental nighttime oxygen  CAD in native artery No complaints of chest pain Continue aspirin, atorvastatin.  Holding bisoprolol due to hypotension  Essential hypertension Hold home antihypertensives due to hypotension      {Tip this will not be part of the note when signed Body mass index is 28.19 kg/m. , ,  (Optional):26781}  {(NOTE) Pain control PDMP Statment (Optional):26782} Consultants: *** Procedures performed: ***  Disposition: {Plan; Disposition:26390} Diet recommendation:  Discharge Diet Orders (From admission, onward)     Start     Ordered   07/08/23 0000  Diet - low sodium heart healthy        07/08/23 1344           {Diet_Plan:26776} DISCHARGE MEDICATION: Allergies as of 07/08/2023       Reactions   Hydrocodone-acetaminophen Hives  Vicodin [hydrocodone-acetaminophen] Hives   Trelegy Ellipta [fluticasone-umeclidin-vilant]  Rash   Changed to Budeson-Glycopyrrol-Formoterol by provider        Medication List     STOP taking these medications    metolazone 2.5 MG tablet Commonly known as: ZAROXOLYN       TAKE these medications    Accu-Chek Guide Test test strip Generic drug: glucose blood   Accu-Chek Softclix Lancets lancets SMARTSIG:Lancet Topical   acetaminophen 500 MG tablet Commonly known as: TYLENOL Take 1,000 mg by mouth every 6 (six) hours as needed for moderate pain or headache.   albuterol 108 (90 Base) MCG/ACT inhaler Commonly known as: VENTOLIN HFA Inhale 2 puffs into the lungs every 6 (six) hours as needed for wheezing or shortness of breath.   ALPRAZolam 0.5 MG tablet Commonly known as: XANAX Take 0.5 mg by mouth 2 (two) times daily as needed for anxiety.   aspirin 81 MG tablet Take 81 mg by mouth at bedtime.   atorvastatin 40 MG tablet Commonly known as: Lipitor Take 1 tablet (40 mg total) by mouth at bedtime.   bisoprolol 5 MG tablet Commonly known as: ZEBETA TAKE 1 TABLET (5 MG TOTAL) BY MOUTH DAILY.   clopidogrel 75 MG tablet Commonly known as: PLAVIX Take 1 tablet (75 mg total) by mouth daily with breakfast.   empagliflozin 10 MG Tabs tablet Commonly known as: JARDIANCE Take 1 tablet (10 mg total) by mouth daily.   ezetimibe 10 MG tablet Commonly known as: ZETIA TAKE 1 TABLET BY MOUTH EVERY DAY   ferrous sulfate 325 (65 FE) MG tablet Take 1 tablet (325 mg total) by mouth daily with breakfast.   gabapentin 100 MG capsule Commonly known as: NEURONTIN Take 200 mg by mouth at bedtime.   guaiFENesin-dextromethorphan 100-10 MG/5ML syrup Commonly known as: ROBITUSSIN DM Take 10 mLs by mouth every 4 (four) hours as needed for cough.   levothyroxine 125 MCG tablet Commonly known as: SYNTHROID Take 125 mcg by mouth daily.   ondansetron 4 MG tablet Commonly known as: ZOFRAN Take 4 mg by mouth every 4 (four) hours as needed for nausea or vomiting.    oxyCODONE-acetaminophen 5-325 MG tablet Commonly known as: Percocet Take 1 tablet by mouth every 4 (four) hours as needed for severe pain (pain score 7-10).   OXYGEN Inhale 2 L/L into the lungs at bedtime.   pantoprazole 40 MG tablet Commonly known as: PROTONIX Take 40 mg by mouth 2 (two) times daily.   potassium chloride SA 20 MEQ tablet Commonly known as: KLOR-CON M Take 2 tablets (40 mEq total) by mouth daily. For 2 days only   spironolactone 25 MG tablet Commonly known as: ALDACTONE TAKE 1 TABLET (25 MG TOTAL) BY MOUTH DAILY.   torsemide 20 MG tablet Commonly known as: DEMADEX 20mg  daily AND take an additional 20 MG if needed for weight gain or swelling. What changed:  how much to take how to take this when to take this   traZODone 100 MG tablet Commonly known as: DESYREL Take 100 mg by mouth daily as needed for sleep.        Discharge Exam: Filed Weights   07/06/23 2200 07/08/23 0500  Weight: 73.1 kg 74.5 kg   ***  Condition at discharge: {DC Condition:26389}  The results of significant diagnostics from this hospitalization (including imaging, microbiology, ancillary and laboratory) are listed below for reference.   Imaging Studies: CT Lumbar Spine Wo Contrast Result Date: 07/06/2023 CLINICAL DATA:  Fall injury with low  back pain. EXAM: CT LUMBAR SPINE WITHOUT CONTRAST TECHNIQUE: Multidetector CT imaging of the lumbar spine was performed without intravenous contrast administration. Multiplanar CT image reconstructions were also generated. RADIATION DOSE REDUCTION: This exam was performed according to the departmental dose-optimization program which includes automated exposure control, adjustment of the mA and/or kV according to patient size and/or use of iterative reconstruction technique. COMPARISON:  CT abdomen pelvis without contrast 10/16/2022. FINDINGS: Segmentation: Standard. Alignment: There is a mild chronic levoscoliosis, and mild chronic grade 1 L5-S1  anterolisthesis, the latter likely related to facet hypertrophy. No other AP listhesis is seen. Vertebrae: No acute fracture is evident or focal pathologic process. The bones are mildly demineralized. The SI joints are patent with vacuum phenomenon and mild spurring. No sacral insufficiency fracture is seen. Paraspinal and other soft tissues: Moderate to heavy aortoiliac and visceral branch vessel atherosclerosis. Stable 3 cm fusiform infrarenal AAA. Stable left adrenal adenomas. No paraspinal hematoma or mass. Disc levels: The lumbar discs are preserved in heights. There is a left far lateral disc bulge L2-3 but no significant posterior bulge and no herniation or stenosis. At L3-4 there is a mild diffuse annular bulge but no herniation or canal stenosis. There is mild facet spurring without foraminal stenosis. At L4-5, there is a mild left paracentral disc bulge without herniation or stenosis. there Is mild facet hypertrophy without foraminal compromise. At L5-S1 there is advanced left and moderate right facet hypertrophy, grade 1 spondylolisthesis. There is mild posterior disc bulge without herniation or canal stenosis. The foramina are mildly stenotic. IMPRESSION: 1. Osteopenia, scoliosis and degenerative changes without evidence of fracture. 2. Grade 1 degenerative anterolisthesis chronically at L5-S1. 3. Multilevel disc bulges with herniation or canal stenosis. 4. Mild foraminal stenosis L5-S1. 5. Pole aortic atherosclerosis with stable 3 cm infrarenal AAA. Electronically Signed   By: Almira Bar M.D.   On: 07/06/2023 23:24   DG Chest 2 View Result Date: 07/06/2023 CLINICAL DATA:  Fluid retention, coughing, chronic diastolic heart failure. EXAM: CHEST - 2 VIEW COMPARISON:  Portable chest 03/24/2023 FINDINGS: Cardiac size is upper limits of normal with improvement. There is mild central vascular prominence but no overt edema with resolution of the prior edema. The mediastinum is stable. There is  calcification in the aortic knob and left subclavian artery stenting. There is no consolidation, effusion or pneumothorax. There is thoracic kyphosis with spondylosis and multilevel bridging enthesopathy. Osteopenia. IMPRESSION: Mild vascular prominence without edema. No other evidence of acute chest disease. Aortic atherosclerosis. Electronically Signed   By: Almira Bar M.D.   On: 07/06/2023 23:04   CT Head Wo Contrast Result Date: 07/06/2023 CLINICAL DATA:  Fall injury with head trauma and low back pain. EXAM: CT HEAD WITHOUT CONTRAST TECHNIQUE: Contiguous axial images were obtained from the base of the skull through the vertex without intravenous contrast. RADIATION DOSE REDUCTION: This exam was performed according to the departmental dose-optimization program which includes automated exposure control, adjustment of the mA and/or kV according to patient size and/or use of iterative reconstruction technique. COMPARISON:  MRI brain 04/24/2022. FINDINGS: Brain: There is mild cerebral atrophy and small vessel disease with unremarkable cerebellum and brainstem. No acute cortical based infarct, hemorrhage, mass, mass effect or midline shift are seen. The ventricles are normal in size and position. There are tiny chronic lacunar infarcts in both external capsules. Basal cisterns are patent. Vascular: There are scattered calcifications in both siphons. No hyperdense central vessel is seen. Skull: Negative for fractures or focal lesions. Sinuses/Orbits: No  acute findings. Clear sinuses and mastoids. Negative orbits. Other: None. IMPRESSION: No acute intracranial CT findings, depressed skull fractures or interval changes. Chronic changes. Electronically Signed   By: Almira Bar M.D.   On: 07/06/2023 23:00    Microbiology: Results for orders placed or performed during the hospital encounter of 07/06/23  Resp panel by RT-PCR (RSV, Flu A&B, Covid) Anterior Nasal Swab     Status: None   Collection Time:  07/06/23 10:11 PM   Specimen: Anterior Nasal Swab  Result Value Ref Range Status   SARS Coronavirus 2 by RT PCR NEGATIVE NEGATIVE Final    Comment: (NOTE) SARS-CoV-2 target nucleic acids are NOT DETECTED.  The SARS-CoV-2 RNA is generally detectable in upper respiratory specimens during the acute phase of infection. The lowest concentration of SARS-CoV-2 viral copies this assay can detect is 138 copies/mL. A negative result does not preclude SARS-Cov-2 infection and should not be used as the sole basis for treatment or other patient management decisions. A negative result may occur with  improper specimen collection/handling, submission of specimen other than nasopharyngeal swab, presence of viral mutation(s) within the areas targeted by this assay, and inadequate number of viral copies(<138 copies/mL). A negative result must be combined with clinical observations, patient history, and epidemiological information. The expected result is Negative.  Fact Sheet for Patients:  BloggerCourse.com  Fact Sheet for Healthcare Providers:  SeriousBroker.it  This test is no t yet approved or cleared by the [place] FDA and  has been authorized for detection and/or diagnosis of SARS-CoV-2 by FDA under an Emergency Use Authorization (EUA). This EUA will remain  in effect (meaning this test can be used) for the duration of the COVID-19 declaration under Section 564(b)(1) of the Act, 21 U.S.C.section 360bbb-3(b)(1), unless the authorization is terminated  or revoked sooner.       Influenza A by PCR NEGATIVE NEGATIVE Final   Influenza B by PCR NEGATIVE NEGATIVE Final    Comment: (NOTE) The Xpert Xpress SARS-CoV-2/FLU/RSV plus assay is intended as an aid in the diagnosis of influenza from Nasopharyngeal swab specimens and should not be used as a sole basis for treatment. Nasal washings and aspirates are unacceptable for Xpert Xpress  SARS-CoV-2/FLU/RSV testing.  Fact Sheet for Patients: BloggerCourse.com  Fact Sheet for Healthcare Providers: SeriousBroker.it  This test is not yet approved or cleared by the [place] FDA and has been authorized for detection and/or diagnosis of SARS-CoV-2 by FDA under an Emergency Use Authorization (EUA). This EUA will remain in effect (meaning this test can be used) for the duration of the COVID-19 declaration under Section 564(b)(1) of the Act, 21 U.S.C. section 360bbb-3(b)(1), unless the authorization is terminated or revoked.     Resp Syncytial Virus by PCR NEGATIVE NEGATIVE Final    Comment: (NOTE) Fact Sheet for Patients: BloggerCourse.com  Fact Sheet for Healthcare Providers: SeriousBroker.it  This test is not yet approved or cleared by the [place] FDA and has been authorized for detection and/or diagnosis of SARS-CoV-2 by FDA under an Emergency Use Authorization (EUA). This EUA will remain in effect (meaning this test can be used) for the duration of the COVID-19 declaration under Section 564(b)(1) of the Act, 21 U.S.C. section 360bbb-3(b)(1), unless the authorization is terminated or revoked.  Performed at [facility], [address] [place], [place] [address]     Labs: CBC: Recent Labs  Lab 07/06/23 2211 07/07/23 0207 07/08/23 0518 07/08/23 1259  WBC 11.7* 12.2* 7.1  --   NEUTROABS  10.2*  --  4.4  --   HGB 7.5* 6.6* 7.2* 7.7*  HCT 25.9* 22.7* 24.1* 26.2*  MCV 84.1 84.1 84.6  --   PLT 371 350 305  --    Basic Metabolic Panel: Recent Labs  Lab 07/06/23 2211 07/07/23 0207 07/08/23 0518  NA 134* 132* 142  K 2.4* 2.9* 4.4  CL 94* 99 112*  CO2 27 23 25   GLUCOSE 137* 267* 97  BUN 69* 63* 44*  CREATININE 2.15* 2.07* 1.59*  CALCIUM 9.4 8.4* 8.8*  MG 2.3  --  2.5*   Liver Function Tests: Recent Labs  Lab 07/06/23 2211  AST  10*  ALT 9  ALKPHOS 55  BILITOT 0.6  PROT 6.6  ALBUMIN 3.5   CBG: Recent Labs  Lab 07/07/23 0627 07/08/23 0442  GLUCAP 156* 96    Discharge time spent: {LESS THAN/GREATER THAN:26388} 30 minutes.  Signed: Pennie Banter, DO [facility] 07/08/2023

## 2023-07-09 ENCOUNTER — Encounter: Payer: Self-pay | Admitting: Internal Medicine

## 2023-07-10 ENCOUNTER — Other Ambulatory Visit: Payer: Self-pay | Admitting: *Deleted

## 2023-07-10 DIAGNOSIS — D649 Anemia, unspecified: Secondary | ICD-10-CM

## 2023-07-11 NOTE — Progress Notes (Unsigned)
 Cardiology Clinic Note   Date: 07/15/2023 ID: ISOBEL EISENHUTH, DOB Oct 21, 1955, MRN [id number]  Primary Cardiologist:  Lorine Bears, MD  Chief Complaint   Misty Adams is a 68 y.o. female who presents to the clinic today for hospital follow up.   Patient Profile   Misty Adams is followed by Dr. Kirke Corin for the history outlined below.      Past medical history significant for: CAD. R/LHC 03/26/2023: Proximal RCA 45%.  Mid RCA 90%.  PCI with DES 3.0 x 22 mm to mid RCA. Chronic HFpEF. Echo 03/09/2023: EF 60 to 65%.  No RWMA.  Grade II DD.  Normal RV size/function.  Mild LAE.  Mild mitral stenosis, mean gradient 6 mmHg.  Moderate MAC.  Mild AS, mean gradient 9.7 mmHg. R/LHC 03/26/2023: Hemodynamic findings consistent with mild pulmonary hypertension (PAP 49/24-33 mmHg, PCWP 24 mmHg, transpulmonary gradient 9 mmHg). Hypertension. Hyperlipidemia. Lipid panel 10/16/2022: LDL 97, HDL 58, TG 133, total 181. LPa 03/27/2023: 54.2. Subclavian artery stenosis. Carotid duplex 07/15/2017: No evidence of stenosis left ICA.  Left subclavian artery was stenotic with disturbed flow.  Elevated velocities noted in the left proximal subclavian artery suggestive of > 50% stenosis. Aortic arch angiography 08/06/2017: Angioplasty and stenting left subclavian artery. Chronic venous insufficiency. COPD. Interstitial lung disease. GERD. Hypothyroidism. CKD stage IIIb. Tobacco abuse.  In summary, previously evaluated for chest pain in June 2011 with exercise Myoview which was without ischemia or infarct EF 79%.  In April 2019 she underwent stenting of left subclavian artery as above.  In March 2024 she was admitted to [facility] with E. coli UTI, severe sepsis, and AKI.  High-sensitivity troponin rose to 56.  She did not undergo cardiology evaluation.  In August 2024 CT chest performed secondary to weight loss showed aortic atherosclerosis, unchanged 4 mm right apical pulmonary nodule, enlarged pulmonary  trunk consistent with PAH, left adrenal adenoma.  Patient presented to [facility] ED via EMS for shortness of breath on 03/08/2023 and was admitted.  She reported a history of CHF managed by PCP with hydrochlorothiazide and Lasix.  She has never seen cardiology.  She reported stable home weight.  Every 2 to 3 months patient describes episodic tightness in jaws, arms, chest usually occurring at rest, lasting about 30 minutes, and resolving after aspirin.  She is very sedentary at home.  She continues to smoke 1 pack/day and has chronic DOE worsening over the past year.  Patient went on a [place] cruise the week prior to admission and reported feeling more dyspneic and fatigued while on vacation.  She returned home in the early a.m. on 11/17 and went to bed.  Later that morning after eating breakfast she became markedly dyspneic.  She contacted her son who is a paramedic and then EMS.  She was hypoxic in the field and started on BiPAP.  Upon arrival to ED she was afebrile and hypertensive at 175/68, 93% on BiPAP.  EKG showed sinus rhythm, 97 bpm with baseline artifact.  Initial labs: BUN 50, creatinine 2.01, hemoglobin 10.9, BNP 710.1, troponin 18.  Chest x-ray showed mild CHF.  She was treated with IV Lasix and SL NTG.  Patient was discharged on 03/10/2023.  Secondary to elevated BUN/creatinine, Lasix and potassium were held until follow-up.  Carvedilol was increased and amlodipine was added for hypertension.  Plan for outpatient ischemic evaluation.    Patient was seen for hospital follow-up on 03/16/2023.  Her PCP had started her on torsemide secondary to increased lower  extremity edema and dyspnea.  At the time of her visit she continued to be volume up with approximately 9 pound weight gain since discharge from hospital.  She was instructed to increase torsemide for 3 days then resume daily dosing.  Patient presented for close follow-up December 2024 with increased lower extremity edema and weight gain.  She  was very dyspneic with low SpO2.  Breath sounds were diminished and she had JVD.  She was sent to ED for further management and was admitted.  She was diuresed with IV Lasix.  She underwent R/LHC and had PCI with DES to mid RCA.  Hemodynamic findings consistent with mild pulmonary hypertension.  Patient followed up with advanced heart clinic post hospitalization and reported improving dyspnea.  She was instructed to decrease torsemide to 20 mg daily with an additional 20 mg for weight gain or edema.  Patient was last seen in the office by me on 04/13/2023 for hospital follow-up.  She reported taking an extra dose of torsemide for weight gain the week prior.  Dyspnea continue to improve and she was not wearing supplemental O2.  No medication changes were made.  Patient was last seen by Clarisa Kindred, FNP in the advanced heart failure clinic on 06/29/2023 for routine follow-up.  She reported moderate dyspnea with minimal exertion that she felt was getting better since increasing torsemide.  She reported taking 2 torsemide twice a day the last few days with improvement in dyspnea and decline in weight.  She was given metolazone to take every other day x 2 doses.  Patient presented to the ED on 07/06/2023 with syncopal episodes.  She reported initially feeling lightheaded during the day and it worsened into the evening.  She got up to get something at her front door when she suddenly felt more lightheaded and like she was going to pass out.  She sat down but when she tried to stand up she felt like her legs gave out on her and she fell back hitting her head.  She feels she did not fully lose consciousness during the episode.  She had a second episode with her daughter in which she was fully unconscious for moment.  Initial labs: WBC 11.7, hemoglobin 7.5, sodium 134, potassium 2.4, creatinine 2.15, BUN 69, respiratory panel negative, magnesium 2.3.  Troponin negative x 3.  Head CT was negative.  She was hypertensive  upon arrival to ED with BP 95/45.  She was transfused 1 unit packed RBCs as well as IV iron infusion.  Patient declined GI evaluation and asked to be discharged home.  Patient was discharged on 07/08/2023 to follow-up with GI, hematology, primary care as an outpatient.     History of Present Illness    Today, patient is accompanied by her daughter. She reports she is improving since her hospital admission last week. She reports dealing with anemia for a couple of years. Her last iron infusion was 1 month before hospital admission. Her breathing is better (she is not on supplemental O2 today) and lower extremity edema/abdominal bloating are improving. She was discharged from the hospital with instructions to stop torsemide. This caused increased edema and weight gain. She was in contact with AHF clinic and was instructed to resume torsemide 20 mg bid. She is not yet back to baseline weight of 160 lb (164 lb this morning). She has a visit with hematology tomorrow and GI in  April. She has had no further syncopal episodes since hospital discharge. Home BP 101-120/42-50. She  is currently still hold spironolactone.  She has been performing ADLs and a few things around her home but not doing the activity she was doing prior to hospital admission.     ROS: All other systems reviewed and are otherwise negative except as noted in History of Present Illness.  EKGs/Labs Reviewed        07/06/2023: ALT 9; AST 10 07/08/2023: BUN 44; Creatinine, Ser 1.59; Potassium 4.4; Sodium 142   07/08/2023: Hemoglobin 7.7; WBC 7.1   No results found for requested labs within last 365 days.   06/29/2023: BNP 192.3   Physical Exam    VS:  BP (!) 120/40 (BP Location: Left Arm, Patient Position: Sitting, Cuff Size: Normal)   Pulse 65   Ht 5\' 4"  (1.626 m)   Wt 169 lb (76.7 kg)   SpO2 95%   BMI 29.01 kg/m  , BMI Body mass index is 29.01 kg/m.  GEN: Well nourished, well developed, in no acute distress. Neck: No JVD or  carotid bruits. Cardiac:  RRR. 2/6 systolic murmur. No rubs or gallops.   Respiratory:  Respirations regular and unlabored. Clear to auscultation without rales, wheezing or rhonchi. GI: Soft, nontender, nondistended. Extremities: Radials/DP/PT 2+ and equal bilaterally. No clubbing or cyanosis. 1+ pitting edema bilateral lower extremities.   Skin: Warm and dry, no rash. Neuro: Strength intact.  Assessment & Plan   CAD S/p PCI with DES to mid RCA 03/26/2023.  Patient denies chest pain, pressure or tightness.  -Continue aspirin, Plavix, atorvastatin, Zetia, bisoprolol.   Chronic HFpEF Echo November 2024 showed normal LV/RV function, Grade II DD, mild LAE, mild mitral stenosis/aortic stenosis.  Patient with hospital admission 12/2-12/7.  Patient followed up with advanced heart failure clinic on 06/29/2023 and was given 2 doses of metolazone.  She had 2 syncopal events at home on 07/06/2023 and presented to the ED.  Initial potassium 2.4.  Patient reports breathing and lower extremity edema are improving. She is not back to her dry weight of 160 lb. She was 164 lb this morning.  -Continue twice a day dosing of torsemide through the weekend or until she reaches 160 lb (whichever comes first). The go back to 20 mg daily with an extra dose of 20 mg for increased lower extremity edema/abdominal bloating or weight gain.  -Continue Jardiance, bisoprolol. -BMP today.   Hypertension BP today 120/40 on intake and 122/54 on my recheck.. Home BP since hospital discharge 101-120/42-50. She will adjust torsemide dosing as above.  -Continue bisoprolol.   Hyperlipidemia LDL June 2024 97, not at goal. -Continue atorvastatin and Zetia.  Anemia Patient with recent hospital admission on 07/06/2023 for syncope.  Initial hemoglobin 7.5 down to 6.6 on repeat CBC.  Patient was transfused 1 unit of PRBCs and underwent iron infusion.  She declined inpatient GI consultation.  She reports upcoming visit with hematology  tomorrow and GI in April. She has been dealing with anemia for a couple of years. Her last iron infusion was a month before hospital admission.  -Keep scheduled follow-up with GI and hematology. -Repeat CBC today.   Tobacco abuse Patient continues to smoke 2 packs per day. She was down to a pack and a half. She is working on cutting back on her smoking.  -Encouraged to continue to cut back on smoking.   Disposition: CBC and BMP today. Continue to follow with AHF clinic. Return in 4-5 months.          Signed, Etta Grandchild. Cassidey Barrales, DNP, NP-C

## 2023-07-15 ENCOUNTER — Encounter: Payer: Self-pay | Admitting: Student

## 2023-07-15 ENCOUNTER — Ambulatory Visit: Payer: Medicare HMO | Attending: Student | Admitting: Student

## 2023-07-15 VITALS — BP 120/40 | HR 65 | Ht 64.0 in | Wt 169.0 lb

## 2023-07-15 DIAGNOSIS — I2510 Atherosclerotic heart disease of native coronary artery without angina pectoris: Secondary | ICD-10-CM | POA: Diagnosis not present

## 2023-07-15 DIAGNOSIS — I5032 Chronic diastolic (congestive) heart failure: Secondary | ICD-10-CM

## 2023-07-15 DIAGNOSIS — E785 Hyperlipidemia, unspecified: Secondary | ICD-10-CM

## 2023-07-15 DIAGNOSIS — I10 Essential (primary) hypertension: Secondary | ICD-10-CM

## 2023-07-15 DIAGNOSIS — Z720 Tobacco use: Secondary | ICD-10-CM

## 2023-07-15 DIAGNOSIS — Z79899 Other long term (current) drug therapy: Secondary | ICD-10-CM

## 2023-07-15 DIAGNOSIS — D649 Anemia, unspecified: Secondary | ICD-10-CM

## 2023-07-15 NOTE — Patient Instructions (Signed)
 Medication Instructions:  Your Physician recommend you continue on your current medication as directed.    *If you need a refill on your cardiac medications before your next appointment, please call your pharmacy*   Lab Work: Your provider would like for you to have following labs drawn today CBC and BMeT.   If you have labs (blood work) drawn today and your tests are completely normal, you will receive your results only by: MyChart Message (if you have MyChart) OR A paper copy in the mail If you have any lab test that is abnormal or we need to change your treatment, we will call you to review the results.   Follow-Up: At [facility], you and your health needs are our priority.  As part of our continuing mission to provide you with exceptional heart care, we have created designated Provider Care Teams.  These Care Teams include your primary Cardiologist (physician) and Advanced Practice Providers (APPs -  Physician Assistants and Nurse Practitioners) who all work together to provide you with the care you need, when you need it.   Your next appointment:   4 month(s)  Provider:   You may see Lorine Bears, MD or Carlos Levering, NP

## 2023-07-16 ENCOUNTER — Encounter: Payer: Self-pay | Admitting: Nurse Practitioner

## 2023-07-16 ENCOUNTER — Inpatient Hospital Stay

## 2023-07-16 ENCOUNTER — Inpatient Hospital Stay: Attending: Internal Medicine

## 2023-07-16 ENCOUNTER — Inpatient Hospital Stay (HOSPITAL_BASED_OUTPATIENT_CLINIC_OR_DEPARTMENT_OTHER): Admitting: Nurse Practitioner

## 2023-07-16 VITALS — BP 129/88 | HR 61 | Temp 97.2°F | Resp 16 | Wt 167.0 lb

## 2023-07-16 DIAGNOSIS — D649 Anemia, unspecified: Secondary | ICD-10-CM | POA: Diagnosis not present

## 2023-07-16 DIAGNOSIS — D509 Iron deficiency anemia, unspecified: Secondary | ICD-10-CM | POA: Diagnosis present

## 2023-07-16 DIAGNOSIS — D500 Iron deficiency anemia secondary to blood loss (chronic): Secondary | ICD-10-CM | POA: Diagnosis not present

## 2023-07-16 DIAGNOSIS — Z79899 Other long term (current) drug therapy: Secondary | ICD-10-CM | POA: Diagnosis not present

## 2023-07-16 LAB — CBC
Hematocrit: 30.5 % — ABNORMAL LOW (ref 34.0–46.6)
Hemoglobin: 8.9 g/dL — ABNORMAL LOW (ref 11.1–15.9)
MCH: 25.1 pg — ABNORMAL LOW (ref 26.6–33.0)
MCHC: 29.2 g/dL — ABNORMAL LOW (ref 31.5–35.7)
MCV: 86 fL (ref 79–97)
Platelets: 350 10*3/uL (ref 150–450)
RBC: 3.55 x10E6/uL — ABNORMAL LOW (ref 3.77–5.28)
RDW: 18.1 % — ABNORMAL HIGH (ref 11.7–15.4)
WBC: 9.7 10*3/uL (ref 3.4–10.8)

## 2023-07-16 LAB — CBC WITH DIFFERENTIAL (CANCER CENTER ONLY)
Abs Immature Granulocytes: 0.07 K/uL (ref 0.00–0.07)
Basophils Absolute: 0 K/uL (ref 0.0–0.1)
Basophils Relative: 0 %
Eosinophils Absolute: 0.2 K/uL (ref 0.0–0.5)
Eosinophils Relative: 2 %
HCT: 28.1 % — ABNORMAL LOW (ref 36.0–46.0)
Hemoglobin: 8.1 g/dL — ABNORMAL LOW (ref 12.0–15.0)
Immature Granulocytes: 1 %
Lymphocytes Relative: 12 %
Lymphs Abs: 1.3 K/uL (ref 0.7–4.0)
MCH: 25 pg — ABNORMAL LOW (ref 26.0–34.0)
MCHC: 28.8 g/dL — ABNORMAL LOW (ref 30.0–36.0)
MCV: 86.7 fL (ref 80.0–100.0)
Monocytes Absolute: 0.6 K/uL (ref 0.1–1.0)
Monocytes Relative: 6 %
Neutro Abs: 8.6 K/uL — ABNORMAL HIGH (ref 1.7–7.7)
Neutrophils Relative %: 79 %
Platelet Count: 276 K/uL (ref 150–400)
RBC: 3.24 MIL/uL — ABNORMAL LOW (ref 3.87–5.11)
RDW: 18.9 % — ABNORMAL HIGH (ref 11.5–15.5)
WBC Count: 10.8 K/uL — ABNORMAL HIGH (ref 4.0–10.5)
nRBC: 0 % (ref 0.0–0.2)

## 2023-07-16 LAB — BASIC METABOLIC PANEL WITH GFR
BUN/Creatinine Ratio: 25 (ref 12–28)
BUN: 42 mg/dL — ABNORMAL HIGH (ref 8–27)
CO2: 26 mmol/L (ref 20–29)
Calcium: 9.5 mg/dL (ref 8.7–10.3)
Chloride: 99 mmol/L (ref 96–106)
Creatinine, Ser: 1.7 mg/dL — ABNORMAL HIGH (ref 0.57–1.00)
Glucose: 77 mg/dL (ref 70–99)
Potassium: 3.7 mmol/L (ref 3.5–5.2)
Sodium: 141 mmol/L (ref 134–144)
eGFR: 32 mL/min/{1.73_m2} — ABNORMAL LOW (ref >59)

## 2023-07-16 LAB — SAMPLE TO BLOOD BANK

## 2023-07-16 LAB — LACTATE DEHYDROGENASE: LDH: 103 U/L (ref 98–192)

## 2023-07-16 MED ORDER — SODIUM CHLORIDE 0.9% FLUSH
10.0000 mL | Freq: Once | INTRAVENOUS | Status: DC | PRN
  Filled 2023-07-16: qty 10

## 2023-07-16 MED ORDER — IRON SUCROSE 20 MG/ML IV SOLN
200.0000 mg | Freq: Once | INTRAVENOUS | Status: AC
  Administered 2023-07-16: 200 mg via INTRAVENOUS

## 2023-07-16 NOTE — Progress Notes (Signed)
 [facility] CONSULT NOTE  Patient Care Team: Sari Cunning, MD as PCP - General (Internal Medicine) Wenona Hamilton, MD as PCP - Cardiology (Cardiology) Gwyn Leos, MD as Consulting Physician (Oncology)  CHIEF COMPLAINTS/PURPOSE OF CONSULTATION: ANEMIA  HEMATOLOGY HISTORY  # ANEMIA [Hb; MCV-platelets- WBC; Iron  sat; ferritin;  Kidney: stage III CKD- 30-40s.  CT/US ; Colo/EGD: 2022;  EGD/colo: awaiting repeating EGD/Dr.Russo.   COPD with ongoing tobacco abuse, abnormal CT chest consistent with interstitial lung disease with recent pulmonary consultation, depression, diabetes, hypertension, PAD  HISTORY OF PRESENTING ILLNESS: Patient ambulating independently. Alone.   Misty Adams 68 y.o. female pleasant patient with multiple medical problems as well iron  deficiency anemia who returns to clinic for follow up. She feels poorly and weak. In interim, she was admitted to hospital from 3/18-3/19 for syncope & fall, CHF with hemoglobin 6.6, BP 95/45. She received iron  infusion and transfusions. She declined GI workup and was discharged with hemoglobin 7.7.    Review of Systems  Constitutional:  Positive for malaise/fatigue. Negative for chills, diaphoresis, fever and weight loss.  HENT:  Negative for nosebleeds and sore throat.   Respiratory:  Negative for cough, hemoptysis, sputum production, shortness of breath and wheezing.   Cardiovascular:  Negative for chest pain, palpitations, orthopnea and leg swelling.  Gastrointestinal:  Negative for abdominal pain, blood in stool, constipation, diarrhea, heartburn, melena, nausea and vomiting.  Genitourinary:  Negative for dysuria, frequency and urgency.  Musculoskeletal:  Negative for back pain and joint pain.  Skin: Negative.  Negative for itching and rash.  Neurological:  Positive for weakness. Negative for dizziness, tingling, focal weakness and headaches.  Endo/Heme/Allergies:  Does not bruise/bleed easily.   Psychiatric/Behavioral:  Negative for depression. The patient is not nervous/anxious and does not have insomnia.    MEDICAL HISTORY:  Past Medical History:  Diagnosis Date   Allergy    Anxiety    Chronic heart failure with preserved ejection fraction (HFpEF) (HCC)    a. 02/2023 Echo: EF 60-65%, no rwma, GrII DD, nl RV size/fxn, mild LAE, mild MS (mean grad ), mod MAC, mild AS (mean grad 9.60mmHg).   CKD (chronic kidney disease), stage III (HCC)    COPD (chronic obstructive pulmonary disease) (HCC)    Depression    Diabetes mellitus without complication (HCC)    Hyperlipidemia    Hypertension    Hypothyroidism    Interstitial lung disease (HCC)    PAD (peripheral artery disease) (HCC)    a. 07/2017 L subclavian stenosis s/p stenting.   Psoriasis    Substance abuse (HCC)    Tobacco abuse     SURGICAL HISTORY: Past Surgical History:  Procedure Laterality Date   ABDOMINAL HYSTERECTOMY     AORTIC ARCH ANGIOGRAPHY N/A 08/06/2017   Procedure: AORTIC ARCH ANGIOGRAPHY;  Surgeon: Arvil Lauber, MD;  Location: [facility] INVASIVE CV LAB;  Service: Cardiovascular;  Laterality: N/A;   APPENDECTOMY     BIOPSY  12/19/2022   Procedure: BIOPSY;  Surgeon: Quintin Buckle, DO;  Location: [facility] ENDOSCOPY;  Service: Gastroenterology;;   CESAREAN SECTION     COLONOSCOPY     COLONOSCOPY WITH PROPOFOL  N/A 12/07/2020   Procedure: COLONOSCOPY WITH PROPOFOL ;  Surgeon: Quintin Buckle, DO;  Location: [facility] ENDOSCOPY;  Service: Gastroenterology;  Laterality: N/A;   COLONOSCOPY WITH PROPOFOL  N/A 12/19/2022   Procedure: COLONOSCOPY WITH PROPOFOL ;  Surgeon: Quintin Buckle, DO;  Location: [facility] ENDOSCOPY;  Service: Gastroenterology;  Laterality: N/A;   CORONARY STENT INTERVENTION  N/A 03/26/2023   Procedure: CORONARY STENT INTERVENTION;  Surgeon: Arleen Lacer, MD;  Location: [facility] INVASIVE CV LAB;  Service: Cardiovascular;  Laterality: N/A;   dilatation and curettage     DILATION AND CURETTAGE OF  UTERUS     ESOPHAGOGASTRODUODENOSCOPY N/A 12/07/2020   Procedure: ESOPHAGOGASTRODUODENOSCOPY (EGD);  Surgeon: Quintin Buckle, DO;  Location: [facility] ENDOSCOPY;  Service: Gastroenterology;  Laterality: N/A;   ESOPHAGOGASTRODUODENOSCOPY (EGD) WITH PROPOFOL  N/A 12/19/2022   Procedure: ESOPHAGOGASTRODUODENOSCOPY (EGD) WITH PROPOFOL ;  Surgeon: Quintin Buckle, DO;  Location: [facility] ENDOSCOPY;  Service: Gastroenterology;  Laterality: N/A;   KNEE SURGERY     left wrist ganglion cyst removal     OOPHORECTOMY  1994   PERIPHERAL VASCULAR INTERVENTION Left 08/06/2017   Procedure: PERIPHERAL VASCULAR INTERVENTION;  Surgeon: Arvil Lauber, MD;  Location: [facility] INVASIVE CV LAB;  Service: Cardiovascular;  Laterality: Left;  subclavian   POLYPECTOMY  12/19/2022   Procedure: POLYPECTOMY;  Surgeon: Quintin Buckle, DO;  Location: [facility] ENDOSCOPY;  Service: Gastroenterology;;   RIGHT/LEFT HEART CATH AND CORONARY ANGIOGRAPHY N/A 03/26/2023   Procedure: RIGHT/LEFT HEART CATH AND CORONARY ANGIOGRAPHY;  Surgeon: Arleen Lacer, MD;  Location: [facility] INVASIVE CV LAB;  Service: Cardiovascular;  Laterality: N/A;   TUBAL LIGATION      SOCIAL HISTORY: Social History   Socioeconomic History   Marital status: Divorced    Spouse name: Not on file   Number of children: 5   Years of education: Not on file   Highest education level: Associate degree: academic program  Occupational History   Occupation: Retired  Tobacco Use   Smoking status: Every Day    Current packs/day: 2.00    Average packs/day: 2.0 packs/day for 50.0 years (100.0 ttl pk-yrs)    Types: Cigarettes   Smokeless tobacco: Never  Vaping Use   Vaping status: Former   Substances: Nicotine  Substance and Sexual Activity   Alcohol use: Yes   Drug use: Not Currently   Sexual activity: Not Currently  Other Topics Concern   Not on file  Social History Narrative   Lives w/ 3 of her children   Social Drivers of Health   Financial Resource  Strain: High Risk (05/07/2023)   Received from [facility]   Overall Financial Resource Strain (CARDIA)    Difficulty of Paying Living Expenses: Hard  Food Insecurity: No Food Insecurity (07/07/2023)   Hunger Vital Sign    Worried About Running Out of Food in the Last Year: Never true    Ran Out of Food in the Last Year: Never true  Recent Concern: Food Insecurity - Food Insecurity Present (05/07/2023)   Received from [facility]   Hunger Vital Sign    Worried About Running Out of Food in the Last Year: Sometimes true    Ran Out of Food in the Last Year: Sometimes true  Transportation Needs: No Transportation Needs (07/07/2023)   PRAPARE - Administrator, Civil Service (Medical): No    Lack of Transportation (Non-Medical): No  Physical Activity: Not on file  Stress: Not on file  Social Connections: Moderately Isolated (07/07/2023)   Social Connection and Isolation Panel [NHANES]    Frequency of Communication with Friends and Family: More than three times a week    Frequency of Social Gatherings with Friends and Family: More than three times a week    Attends Religious Services: Never    Database administrator or Organizations: No  Attends Banker Meetings: 1 to 4 times per year    Marital Status: Divorced  Intimate Partner Violence: Not At Risk (07/07/2023)   Humiliation, Afraid, Rape, and Kick questionnaire    Fear of Current or Ex-Partner: No    Emotionally Abused: No    Physically Abused: No    Sexually Abused: No    FAMILY HISTORY: Family History  Problem Relation Age of Onset   Uterine cancer Mother    Stroke Mother    Aneurysm Mother    Heart attack Father    Coronary artery disease Father    Leukemia Father    Depression Father    Diabetes Father    Hyperlipidemia Father    Hypertension Father    Colon polyps Sister    Obesity Sister    Thyroid  disease Sister    COPD Sister    Lung cancer Sister     Breast cancer Maternal Aunt    Breast cancer Maternal Aunt     ALLERGIES:  is allergic to hydrocodone -acetaminophen , vicodin [hydrocodone -acetaminophen ], and trelegy ellipta [fluticasone-umeclidin-vilant].  MEDICATIONS:  Current Outpatient Medications  Medication Sig Dispense Refill   ACCU-CHEK GUIDE TEST test strip      Accu-Chek Softclix Lancets lancets SMARTSIG:Lancet Topical     acetaminophen  (TYLENOL ) 500 MG tablet Take 1,000 mg by mouth every 6 (six) hours as needed for moderate pain or headache.     albuterol  (VENTOLIN  HFA) 108 (90 Base) MCG/ACT inhaler Inhale 2 puffs into the lungs every 6 (six) hours as needed for wheezing or shortness of breath. 1 each 3   ALPRAZolam  (XANAX ) 0.5 MG tablet Take 0.5 mg by mouth 2 (two) times daily as needed for anxiety.      aspirin  81 MG tablet Take 81 mg by mouth at bedtime.      atorvastatin  (LIPITOR) 40 MG tablet Take 1 tablet (40 mg total) by mouth at bedtime. 30 tablet 0   bisoprolol  (ZEBETA ) 5 MG tablet TAKE 1 TABLET (5 MG TOTAL) BY MOUTH DAILY. 30 tablet 5   clopidogrel  (PLAVIX ) 75 MG tablet Take 1 tablet (75 mg total) by mouth daily with breakfast. 90 tablet 3   empagliflozin  (JARDIANCE ) 10 MG TABS tablet Take 1 tablet (10 mg total) by mouth daily. 30 tablet 1   ferrous sulfate  325 (65 FE) MG tablet Take 1 tablet (325 mg total) by mouth daily with breakfast. 30 tablet 3   gabapentin  (NEURONTIN ) 100 MG capsule Take 200 mg by mouth at bedtime.     guaiFENesin -dextromethorphan  (ROBITUSSIN DM) 100-10 MG/5ML syrup Take 10 mLs by mouth every 4 (four) hours as needed for cough. 118 mL 0   levothyroxine  (SYNTHROID , LEVOTHROID) 125 MCG tablet Take 125 mcg by mouth daily.      ondansetron  (ZOFRAN ) 4 MG tablet Take 4 mg by mouth every 4 (four) hours as needed for nausea or vomiting.     oxyCODONE -acetaminophen  (PERCOCET) 5-325 MG tablet Take 1 tablet by mouth every 4 (four) hours as needed for severe pain (pain score 7-10). 20 tablet 0   OXYGEN  Inhale  2 L/L into the lungs at bedtime.     pantoprazole  (PROTONIX ) 40 MG tablet Take 40 mg by mouth 2 (two) times daily.     potassium chloride  SA (KLOR-CON  M20) 20 MEQ tablet Take 20 mEq by mouth daily.     spironolactone  (ALDACTONE ) 25 MG tablet TAKE 1 TABLET (25 MG TOTAL) BY MOUTH DAILY. 30 tablet 5   torsemide  (DEMADEX ) 20 MG tablet Take 20  mg by mouth 2 (two) times daily.     traZODone  (DESYREL ) 100 MG tablet Take 100 mg by mouth daily as needed for sleep.     ezetimibe  (ZETIA ) 10 MG tablet TAKE 1 TABLET BY MOUTH EVERY DAY (Patient not taking: Reported on 07/07/2023) 30 tablet 5   No current facility-administered medications for this visit.    PHYSICAL EXAMINATION: Vitals:   07/16/23 1343  BP: 129/88  Pulse: 61  Resp: 16  Temp: (!) 97.2 F (36.2 C)  SpO2: 100%   Filed Weights   07/16/23 1343  Weight: 167 lb (75.8 kg)   Physical Exam Vitals reviewed.  Constitutional:      Appearance: She is not ill-appearing.  HENT:     Head: Normocephalic and atraumatic.     Mouth/Throat:     Pharynx: Oropharynx is clear.  Cardiovascular:     Rate and Rhythm: Normal rate and regular rhythm.  Pulmonary:     Comments: Decreased breath sounds bilaterally.  Abdominal:     General: There is no distension.     Palpations: Abdomen is soft.  Skin:    General: Skin is warm.     Coloration: Skin is pale.  Neurological:     Mental Status: She is alert and oriented to person, place, and time.  Psychiatric:        Behavior: Behavior normal.        Judgment: Judgment normal.     LABORATORY DATA:  I have reviewed the data as listed Lab Results  Component Value Date   WBC 10.8 (H) 07/16/2023   HGB 8.1 (L) 07/16/2023   HCT 28.1 (L) 07/16/2023   MCV 86.7 07/16/2023   PLT 276 07/16/2023   Recent Labs    12/08/22 1446 01/21/23 1346 03/08/23 2325 03/09/23 0648 07/06/23 2211 07/07/23 0207 07/08/23 0518 07/15/23 1055  NA 134*   < > 139   < > 134* 132* 142 141  K 3.1*   < > 4.3   < > 2.4*  2.9* 4.4 3.7  CL 92*   < > 103   < > 94* 99 112* 99  CO2 31   < > 26   < > 27 23 25 26   GLUCOSE 118*   < > 147*   < > 137* 267* 97 77  BUN 25*   < > 50*   < > 69* 63* 44* 42*  CREATININE 1.38*   < > 2.01*   < > 2.15* 2.07* 1.59* 1.70*  CALCIUM  9.3   < > 8.9   < > 9.4 8.4* 8.8* 9.5  GFRNONAA 41*   < > 26*   < > 24* 25* 35*  --   PROT 7.5  --  7.2  --  6.6  --   --   --   ALBUMIN  3.7  --  3.4*  --  3.5  --   --   --   AST 14*  --  15  --  10*  --   --   --   ALT 11  --  21  --  9  --   --   --   ALKPHOS 67  --  80  --  55  --   --   --   BILITOT 0.4  --  0.9  --  0.6  --   --   --    < > = values in this interval not displayed.   CT Lumbar Spine Wo  Contrast Result Date: 07/06/2023 CLINICAL DATA:  Fall injury with low back pain. EXAM: CT LUMBAR SPINE WITHOUT CONTRAST TECHNIQUE: Multidetector CT imaging of the lumbar spine was performed without intravenous contrast administration. Multiplanar CT image reconstructions were also generated. RADIATION DOSE REDUCTION: This exam was performed according to the departmental dose-optimization program which includes automated exposure control, adjustment of the mA and/or kV according to patient size and/or use of iterative reconstruction technique. COMPARISON:  CT abdomen pelvis without contrast 10/16/2022. FINDINGS: Segmentation: Standard. Alignment: There is a mild chronic levoscoliosis, and mild chronic grade 1 L5-S1 anterolisthesis, the latter likely related to facet hypertrophy. No other AP listhesis is seen. Vertebrae: No acute fracture is evident or focal pathologic process. The bones are mildly demineralized. The SI joints are patent with vacuum phenomenon and mild spurring. No sacral insufficiency fracture is seen. Paraspinal and other soft tissues: Moderate to heavy aortoiliac and visceral branch vessel atherosclerosis. Stable 3 cm fusiform infrarenal AAA. Stable left adrenal adenomas. No paraspinal hematoma or mass. Disc levels: The lumbar discs are  preserved in heights. There is a left far lateral disc bulge L2-3 but no significant posterior bulge and no herniation or stenosis. At L3-4 there is a mild diffuse annular bulge but no herniation or canal stenosis. There is mild facet spurring without foraminal stenosis. At L4-5, there is a mild left paracentral disc bulge without herniation or stenosis. there Is mild facet hypertrophy without foraminal compromise. At L5-S1 there is advanced left and moderate right facet hypertrophy, grade 1 spondylolisthesis. There is mild posterior disc bulge without herniation or canal stenosis. The foramina are mildly stenotic. IMPRESSION: 1. Osteopenia, scoliosis and degenerative changes without evidence of fracture. 2. Grade 1 degenerative anterolisthesis chronically at L5-S1. 3. Multilevel disc bulges with herniation or canal stenosis. 4. Mild foraminal stenosis L5-S1. 5. Pole aortic atherosclerosis with stable 3 cm infrarenal AAA. Electronically Signed   By: Denman Fischer M.D.   On: 07/06/2023 23:24   DG Chest 2 View Result Date: 07/06/2023 CLINICAL DATA:  Fluid retention, coughing, chronic diastolic heart failure. EXAM: CHEST - 2 VIEW COMPARISON:  Portable chest 03/24/2023 FINDINGS: Cardiac size is upper limits of normal with improvement. There is mild central vascular prominence but no overt edema with resolution of the prior edema. The mediastinum is stable. There is calcification in the aortic knob and left subclavian artery stenting. There is no consolidation, effusion or pneumothorax. There is thoracic kyphosis with spondylosis and multilevel bridging enthesopathy. Osteopenia. IMPRESSION: Mild vascular prominence without edema. No other evidence of acute chest disease. Aortic atherosclerosis. Electronically Signed   By: Denman Fischer M.D.   On: 07/06/2023 23:04   CT Head Wo Contrast Result Date: 07/06/2023 CLINICAL DATA:  Fall injury with head trauma and low back pain. EXAM: CT HEAD WITHOUT CONTRAST TECHNIQUE:  Contiguous axial images were obtained from the base of the skull through the vertex without intravenous contrast. RADIATION DOSE REDUCTION: This exam was performed according to the departmental dose-optimization program which includes automated exposure control, adjustment of the mA and/or kV according to patient size and/or use of iterative reconstruction technique. COMPARISON:  MRI brain 04/24/2022. FINDINGS: Brain: There is mild cerebral atrophy and small vessel disease with unremarkable cerebellum and brainstem. No acute cortical based infarct, hemorrhage, mass, mass effect or midline shift are seen. The ventricles are normal in size and position. There are tiny chronic lacunar infarcts in both external capsules. Basal cisterns are patent. Vascular: There are scattered calcifications in both siphons. No hyperdense central vessel  is seen. Skull: Negative for fractures or focal lesions. Sinuses/Orbits: No acute findings. Clear sinuses and mastoids. Negative orbits. Other: None. IMPRESSION: No acute intracranial CT findings, depressed skull fractures or interval changes. Chronic changes. Electronically Signed   By: Denman Fischer M.D.   On: 07/06/2023 23:00    ASSESSMENT & PLAN:   Acute on chronic anemia # Anemia- Hb-8.3; Ferritin- 6 [PCP- AUG 2024.]- WBC/platelets are normal. Patient is symptomatic.  Likely due to iron  deficiency - from etiology GI blood loss/ CKD- III.  NOT  gentle iron  [ constipation] .  Stopped iron .    #  Currently s/p Venofer  and transfusion in hospital. Hmg 8.1. Recommend venofer . If no significant improvement could add EPO.    # Etiology of iron  deficiency: s/p AUG 2024-[[facility]]-EGD-duodenal ulcer; colonoscopy- multiple polyps; JULY 2024- CT scan abdomen pelvis- no acute process.  AWAITING capsule study.    # CAD s/p stent- CHF [dec 2024; march 2025, Dr.Arida] asprin + plavix    # CKD- III [PCP]- DM - on Torsemide /Spirinolactine weight gain- defer to PCP.     DISPOSITION: Venofer  today Monday or Tuesday of Next week- lab (cbc, hold tube), +/- venofer  Thursday or Friday - lab (cbc, hold tube), +/- venofer  2 weeks- Monday or Tuesday- lab (cbc, hold tube), +/- venofer , then follow up as scheduled- la  No problem-specific Assessment & Plan notes found for this encounter.  All questions were answered. The patient knows to call the clinic with any problems, questions or concerns.  Nelda Balsam, NP 07/16/2023

## 2023-07-17 ENCOUNTER — Inpatient Hospital Stay

## 2023-07-17 ENCOUNTER — Encounter: Payer: Self-pay | Admitting: Internal Medicine

## 2023-07-20 ENCOUNTER — Other Ambulatory Visit: Payer: Self-pay | Admitting: *Deleted

## 2023-07-20 DIAGNOSIS — D649 Anemia, unspecified: Secondary | ICD-10-CM

## 2023-07-21 ENCOUNTER — Inpatient Hospital Stay: Attending: Internal Medicine

## 2023-07-21 ENCOUNTER — Inpatient Hospital Stay

## 2023-07-21 VITALS — BP 118/58 | HR 61 | Temp 98.2°F | Resp 18

## 2023-07-21 DIAGNOSIS — D649 Anemia, unspecified: Secondary | ICD-10-CM

## 2023-07-21 DIAGNOSIS — D509 Iron deficiency anemia, unspecified: Secondary | ICD-10-CM | POA: Insufficient documentation

## 2023-07-21 LAB — CBC WITH DIFFERENTIAL (CANCER CENTER ONLY)
Abs Immature Granulocytes: 0.03 10*3/uL (ref 0.00–0.07)
Basophils Absolute: 0.1 10*3/uL (ref 0.0–0.1)
Basophils Relative: 1 %
Eosinophils Absolute: 0.3 10*3/uL (ref 0.0–0.5)
Eosinophils Relative: 3 %
HCT: 30.8 % — ABNORMAL LOW (ref 36.0–46.0)
Hemoglobin: 8.6 g/dL — ABNORMAL LOW (ref 12.0–15.0)
Immature Granulocytes: 0 %
Lymphocytes Relative: 13 %
Lymphs Abs: 1.1 10*3/uL (ref 0.7–4.0)
MCH: 25 pg — ABNORMAL LOW (ref 26.0–34.0)
MCHC: 27.9 g/dL — ABNORMAL LOW (ref 30.0–36.0)
MCV: 89.5 fL (ref 80.0–100.0)
Monocytes Absolute: 0.5 10*3/uL (ref 0.1–1.0)
Monocytes Relative: 6 %
Neutro Abs: 6.7 10*3/uL (ref 1.7–7.7)
Neutrophils Relative %: 77 %
Platelet Count: 272 10*3/uL (ref 150–400)
RBC: 3.44 MIL/uL — ABNORMAL LOW (ref 3.87–5.11)
RDW: 21.3 % — ABNORMAL HIGH (ref 11.5–15.5)
WBC Count: 8.7 10*3/uL (ref 4.0–10.5)
nRBC: 0 % (ref 0.0–0.2)

## 2023-07-21 LAB — BASIC METABOLIC PANEL WITH GFR
Anion gap: 9 (ref 5–15)
BUN: 31 mg/dL — ABNORMAL HIGH (ref 8–23)
CO2: 30 mmol/L (ref 22–32)
Calcium: 8.9 mg/dL (ref 8.9–10.3)
Chloride: 97 mmol/L — ABNORMAL LOW (ref 98–111)
Creatinine, Ser: 1.64 mg/dL — ABNORMAL HIGH (ref 0.44–1.00)
GFR, Estimated: 33 mL/min — ABNORMAL LOW (ref >60)
Glucose, Bld: 103 mg/dL — ABNORMAL HIGH (ref 70–99)
Potassium: 3.3 mmol/L — ABNORMAL LOW (ref 3.5–5.1)
Sodium: 136 mmol/L (ref 135–145)

## 2023-07-21 LAB — IRON AND TIBC
Iron: 27 ug/dL — ABNORMAL LOW (ref 28–170)
Saturation Ratios: 6 % — ABNORMAL LOW (ref 10.4–31.8)
TIBC: 420 ug/dL (ref 250–450)
UIBC: 393 ug/dL

## 2023-07-21 LAB — SAMPLE TO BLOOD BANK

## 2023-07-21 LAB — FERRITIN: Ferritin: 33 ng/mL (ref 11–307)

## 2023-07-21 MED ORDER — IRON SUCROSE 20 MG/ML IV SOLN
200.0000 mg | Freq: Once | INTRAVENOUS | Status: AC
  Administered 2023-07-21: 200 mg via INTRAVENOUS

## 2023-07-21 NOTE — Patient Instructions (Signed)

## 2023-07-23 ENCOUNTER — Other Ambulatory Visit: Payer: Self-pay

## 2023-07-23 ENCOUNTER — Encounter: Admitting: Family

## 2023-07-23 DIAGNOSIS — D649 Anemia, unspecified: Secondary | ICD-10-CM

## 2023-07-24 ENCOUNTER — Inpatient Hospital Stay

## 2023-07-24 VITALS — BP 124/58 | HR 49 | Temp 98.1°F | Resp 20

## 2023-07-24 DIAGNOSIS — D649 Anemia, unspecified: Secondary | ICD-10-CM

## 2023-07-24 DIAGNOSIS — D509 Iron deficiency anemia, unspecified: Secondary | ICD-10-CM | POA: Diagnosis not present

## 2023-07-24 LAB — CBC WITH DIFFERENTIAL (CANCER CENTER ONLY)
Abs Immature Granulocytes: 0.05 10*3/uL (ref 0.00–0.07)
Basophils Absolute: 0.1 10*3/uL (ref 0.0–0.1)
Basophils Relative: 1 %
Eosinophils Absolute: 0.3 10*3/uL (ref 0.0–0.5)
Eosinophils Relative: 4 %
HCT: 30.2 % — ABNORMAL LOW (ref 36.0–46.0)
Hemoglobin: 8.4 g/dL — ABNORMAL LOW (ref 12.0–15.0)
Immature Granulocytes: 1 %
Lymphocytes Relative: 18 %
Lymphs Abs: 1.5 10*3/uL (ref 0.7–4.0)
MCH: 25.4 pg — ABNORMAL LOW (ref 26.0–34.0)
MCHC: 27.8 g/dL — ABNORMAL LOW (ref 30.0–36.0)
MCV: 91.2 fL (ref 80.0–100.0)
Monocytes Absolute: 0.5 10*3/uL (ref 0.1–1.0)
Monocytes Relative: 6 %
Neutro Abs: 5.6 10*3/uL (ref 1.7–7.7)
Neutrophils Relative %: 70 %
Platelet Count: 280 10*3/uL (ref 150–400)
RBC: 3.31 MIL/uL — ABNORMAL LOW (ref 3.87–5.11)
RDW: 22 % — ABNORMAL HIGH (ref 11.5–15.5)
WBC Count: 8 10*3/uL (ref 4.0–10.5)
nRBC: 0 % (ref 0.0–0.2)

## 2023-07-24 LAB — SAMPLE TO BLOOD BANK

## 2023-07-24 MED ORDER — SODIUM CHLORIDE 0.9% FLUSH
10.0000 mL | Freq: Once | INTRAVENOUS | Status: AC | PRN
Start: 2023-07-24 — End: 2023-07-24
  Administered 2023-07-24: 10 mL
  Filled 2023-07-24: qty 10

## 2023-07-24 MED ORDER — IRON SUCROSE 20 MG/ML IV SOLN
200.0000 mg | Freq: Once | INTRAVENOUS | Status: AC
  Administered 2023-07-24: 200 mg via INTRAVENOUS

## 2023-07-24 NOTE — Progress Notes (Signed)
 Patient declined to wait the 30 minutes for post iron infusion observation today. Tolerated infusion well. VSS.

## 2023-07-27 ENCOUNTER — Telehealth: Payer: Self-pay | Admitting: Family

## 2023-07-27 ENCOUNTER — Other Ambulatory Visit: Payer: Self-pay | Admitting: *Deleted

## 2023-07-27 DIAGNOSIS — D649 Anemia, unspecified: Secondary | ICD-10-CM

## 2023-07-27 NOTE — Telephone Encounter (Incomplete)
 Called to confirm/remind patient of their appointment [facility] on 07/[facility]***.   Appointment:   [x] Confirmed  [] Left mess   [] No answer/No voice mail  [] Phone not in service  Patient reminded to bring all medications and/or complete list.  Confirmed patient has transportation. Gave directions, instructed to utilize valet parking.

## 2023-07-27 NOTE — Progress Notes (Unsigned)
 Advanced Heart Failure Clinic Note    PCP: Danella Penton, MD (last seen 03/25) Cardiologist: Lorine Bears, MD/ Carlos Levering, NP (last seen 03/25)  Chief Complaint:   HPI:  Misty Adams is a 68 y/o female with a history of COPD, HTN, hyperlipidemia, mild pHTN, GERD, subclavian artery stenosis (stented 07/2017), CKD, pulmonary HTN, CAD, iron deficiency anemia, hypothyroidism, PVD, tobacco use and chronic heart failure.   Admitted 07/14/22 with E. coli UTI, severe sepsis, and AKI. High-sensitivity troponin rose to 56. She did not undergo cardiology evaluation. In August 2024 CT chest performed secondary to weight loss showed aortic atherosclerosis, unchanged 4 mm right apical pulmonary nodule, enlarged pulmonary trunk consistent with PAH, left adrenal adenoma.   Admitted 03/08/23 due to waking up short of breath & orthopnea after returning from a cruise. BNP 710. CXR showed heart failure. Initially placed on bipap and weaned down to nasal cannula. IV lasix given. Hypokalemia corrected. Echocardiogram performed on 03/08/2023 showed ejection fraction 60 to 65% with grade 2 diastolic dysfunction. Symptoms improved.   Admitted 03/23/23 due to worsening of shortness of breath, leg swelling and significant weight gain. Developed hypoxia and placed on 5L nasal cannula. BNP elevated and given IV lasix. Cardiology consulted. Chest x-ray showed evidence of pulmonary edema with pleural effusion. CT shows signs consistent with pulmonary hypertension. Oxygen weaned down to 2L. L and R heart cath 12/5 with stent placed to RCA. Hypokalemia corrected.   RHC/ LHC 03/26/23:   Prox RCA lesion is 45% stenosed.   CULPRIT LESION: Mid RCA lesion is 90% stenosed.   A drug-eluting stent was successfully placed using a STENT ONYX FRONTIER 3.0X22.   Post intervention, there is a 0% residual stenosis.   -------------------------------------   Hemodynamic findings consistent with mild pulmonary hypertension.   (PAP 49/24-33 mmHg, PCWP 24 mmHg transpulmonary Gradient 9 mmHg); LVEDP 16 mmHg.   Compensated CHF: LVEDP 16 mmHg, PCWP 24 mmHg.   Recommend uninterrupted dual antiplatelet therapy with Aspirin 81mg  daily and Clopidogrel 75mg  daily for a minimum of 12 months (ACS-Class I recommendation).  Admitted 07/06/23 with lightheadedness and multiple presyncopal episodes in preceding 24 hours leading to a fall on her lower back. Hypotensive in ED 95/45. RBC transfusion given for hemoglobin of 6.6. Was also given IV iron infusion. No injuries noted on imaging in the ED. Given IVF for hypotension. GI consulted and will have outpatient follow-up.   She presents, with her sister, for a HF follow-up visit with a chief complaint of shortness of breath. Has associated fatigue, cough, occasional dizziness, worsening pedal edema and gradual weight gain along with this. Denies chest pain or palpitations. Reports having a very good appetite  Continues to smoke 1.5 ppd. Took 25mg  spironolactone in the past but then her BP dropped. Agreeable to trying a lower dose if needed.   ROS: All systems negative except as listed in HPI, PMH and Problem List.  SH:  Social History   Socioeconomic History   Marital status: Divorced    Spouse name: Not on file   Number of children: 5   Years of education: Not on file   Highest education level: Associate degree: academic program  Occupational History   Occupation: Retired  Tobacco Use   Smoking status: Every Day    Current packs/day: 2.00    Average packs/day: 2.0 packs/day for 50.0 years (100.0 ttl pk-yrs)    Types: Cigarettes   Smokeless tobacco: Never  Vaping Use   Vaping status: Former  Substances: Nicotine  Substance and Sexual Activity   Alcohol use: Yes   Drug use: Not Currently   Sexual activity: Not Currently  Other Topics Concern   Not on file  Social History Narrative   Lives w/ 3 of her children   Social Drivers of Health   Financial Resource Strain:  High Risk (05/07/2023)   Received from [facility]   Overall Financial Resource Strain (CARDIA)    Difficulty of Paying Living Expenses: Hard  Food Insecurity: No Food Insecurity (07/07/2023)   Hunger Vital Sign    Worried About Running Out of Food in the Last Year: Never true    Ran Out of Food in the Last Year: Never true  Recent Concern: Food Insecurity - Food Insecurity Present (05/07/2023)   Received from [facility]   Hunger Vital Sign    Worried About Running Out of Food in the Last Year: Sometimes true    Ran Out of Food in the Last Year: Sometimes true  Transportation Needs: No Transportation Needs (07/07/2023)   PRAPARE - Administrator, Civil Service (Medical): No    Lack of Transportation (Non-Medical): No  Physical Activity: Not on file  Stress: Not on file  Social Connections: Moderately Isolated (07/07/2023)   Social Connection and Isolation Panel [NHANES]    Frequency of Communication with Friends and Family: More than three times a week    Frequency of Social Gatherings with Friends and Family: More than three times a week    Attends Religious Services: Never    Database administrator or Organizations: No    Attends Engineer, structural: 1 to 4 times per year    Marital Status: Divorced  Catering manager Violence: Not At Risk (07/07/2023)   Humiliation, Afraid, Rape, and Kick questionnaire    Fear of Current or Ex-Partner: No    Emotionally Abused: No    Physically Abused: No    Sexually Abused: No    FH:  Family History  Problem Relation Age of Onset   Uterine cancer Mother    Stroke Mother    Aneurysm Mother    Heart attack Father    Coronary artery disease Father    Leukemia Father    Depression Father    Diabetes Father    Hyperlipidemia Father    Hypertension Father    Colon polyps Sister    Obesity Sister    Thyroid disease Sister    COPD Sister    Lung cancer Sister    Breast cancer Maternal  Aunt    Breast cancer Maternal Aunt     Past Medical History:  Diagnosis Date   Allergy    Anxiety    Chronic heart failure with preserved ejection fraction (HFpEF) (HCC)    a. 02/2023 Echo: EF 60-65%, no rwma, GrII DD, nl RV size/fxn, mild LAE, mild Misty (mean grad ), mod MAC, mild AS (mean grad 9.46mmHg).   CKD (chronic kidney disease), stage III (HCC)    COPD (chronic obstructive pulmonary disease) (HCC)    Depression    Diabetes mellitus without complication (HCC)    Hyperlipidemia    Hypertension    Hypothyroidism    Interstitial lung disease (HCC)    PAD (peripheral artery disease) (HCC)    a. 07/2017 L subclavian stenosis s/p stenting.   Psoriasis    Substance abuse (HCC)    Tobacco abuse     Current Outpatient Medications  Medication Sig Dispense  Refill   ACCU-CHEK GUIDE TEST test strip      Accu-Chek Softclix Lancets lancets SMARTSIG:Lancet Topical     acetaminophen (TYLENOL) 500 MG tablet Take 1,000 mg by mouth every 6 (six) hours as needed for moderate pain or headache.     albuterol (VENTOLIN HFA) 108 (90 Base) MCG/ACT inhaler Inhale 2 puffs into the lungs every 6 (six) hours as needed for wheezing or shortness of breath. 1 each 3   ALPRAZolam (XANAX) 0.5 MG tablet Take 0.5 mg by mouth 2 (two) times daily as needed for anxiety.      aspirin 81 MG tablet Take 81 mg by mouth at bedtime.      atorvastatin (LIPITOR) 40 MG tablet Take 1 tablet (40 mg total) by mouth at bedtime. 30 tablet 0   bisoprolol (ZEBETA) 5 MG tablet TAKE 1 TABLET (5 MG TOTAL) BY MOUTH DAILY. 30 tablet 5   clopidogrel (PLAVIX) 75 MG tablet Take 1 tablet (75 mg total) by mouth daily with breakfast. 90 tablet 3   empagliflozin (JARDIANCE) 10 MG TABS tablet Take 1 tablet (10 mg total) by mouth daily. 30 tablet 1   ezetimibe (ZETIA) 10 MG tablet TAKE 1 TABLET BY MOUTH EVERY DAY (Patient not taking: Reported on 07/07/2023) 30 tablet 5   ferrous sulfate 325 (65 FE) MG tablet Take 1 tablet (325 mg total)  by mouth daily with breakfast. 30 tablet 3   gabapentin (NEURONTIN) 100 MG capsule Take 200 mg by mouth at bedtime.     guaiFENesin-dextromethorphan (ROBITUSSIN DM) 100-10 MG/5ML syrup Take 10 mLs by mouth every 4 (four) hours as needed for cough. 118 mL 0   levothyroxine (SYNTHROID, LEVOTHROID) 125 MCG tablet Take 125 mcg by mouth daily.      ondansetron (ZOFRAN) 4 MG tablet Take 4 mg by mouth every 4 (four) hours as needed for nausea or vomiting.     oxyCODONE-acetaminophen (PERCOCET) 5-325 MG tablet Take 1 tablet by mouth every 4 (four) hours as needed for severe pain (pain score 7-10). 20 tablet 0   OXYGEN Inhale 2 L/L into the lungs at bedtime.     pantoprazole (PROTONIX) 40 MG tablet Take 40 mg by mouth 2 (two) times daily.     potassium chloride SA (KLOR-CON M20) 20 MEQ tablet Take 20 mEq by mouth daily.     spironolactone (ALDACTONE) 25 MG tablet TAKE 1 TABLET (25 MG TOTAL) BY MOUTH DAILY. 30 tablet 5   torsemide (DEMADEX) 20 MG tablet Take 20 mg by mouth 2 (two) times daily.     traZODone (DESYREL) 100 MG tablet Take 100 mg by mouth daily as needed for sleep.     No current facility-administered medications for this visit.   Vitals:   07/28/23 1014  BP: (!) 121/50  Pulse: 64  SpO2: 95%  Weight: 168 lb (76.2 kg)   Wt Readings from Last 3 Encounters:  07/28/23 168 lb (76.2 kg)  07/16/23 167 lb (75.8 kg)  07/15/23 169 lb (76.7 kg)   Lab Results  Component Value Date   CREATININE 1.64 (H) 07/21/2023   CREATININE 1.70 (H) 07/15/2023   CREATININE 1.59 (H) 07/08/2023    PHYSICAL EXAM:  General: Well appearing. No resp difficulty HEENT: normal Neck: supple, no JVD Cor: Regular rhythm, rate. No rubs, gallops or murmurs Lungs: clear Abdomen: soft, nontender, nondistended. Extremities: no cyanosis, clubbing, rash, trace pitting edema bilateral lower legs Neuro: alert & oriented X 3. Moves all 4 extremities w/o difficulty. Affect pleasant  ECG: not done   ReDs  reading: 33 %, normal    ASSESSMENT & PLAN:  1: Ischemic heart failure with preserved ejection fraction- - cath with stent 03/26/23 - NYHA class II - euvolemic - Reds: 33% - weighing daily; reminded to call for overnight weight gain > 2 pounds or a weekly weight gain  >5 pounds - weight up 2 pounds from last visit here 1 month ago - Echo 03/09/23: EF 60-65% with Grade II DD, mild LAE, mild AS - continue bisoprolol 5mg  daily - continue jardiance 10mg  daily  - continue potassium BID - resume spironolactone 12.5mg  daily - continue torsemide 40mg  daily - BMET today, 1 week and at next visi - emphasized wearing compression socks daily - not adding salt and has been looking at food labels - BNP 03/23/23 was 554.0  2: HTN- - BP 121/50 - saw PCP Hyacinth Meeker) 03/25 - BMP 07/21/23 reviewed and showed sodium 136, potassium 3.3, creatinine 1.64, GFR 33 - BMET today  3: CAD- - saw cardiology ([place]) 03/25 - continue clopidogrel 75mg  daily - continue ASA 81mg  daily - RHC/ LHC 03/26/23:   Prox RCA lesion is 45% stenosed.   CULPRIT LESION: Mid RCA lesion is 90% stenosed.   A drug-eluting stent was successfully placed using a STENT ONYX FRONTIER 3.0X22.   Post intervention, there is a 0% residual stenosis.   Hemodynamic findings consistent with mild pulmonary hypertension.  (PAP 49/24-33 mmHg, PCWP 24 mmHg transpulmonary Gradient 9 mmHg); LVEDP 16 mmHg.  4: COPD (managed by pulmonology)- - continue albuterol inhaler PRN - continue breztri BID - saw pulmonology Meredeth Ide) 12/24 - wearing oxygen at 2L at bedtime only  5: Anemia (managed by hematology)- - continue ferrous sulfate 325mg  daily - saw hematology Freida Busman)  03/25 - Hg 07/24/23 was 8.4  6: Hyperlipidemia- - continue atorvastatin 40mg  daily - continue ezetimibe 10mg  daily - LDL 05/05/23 was 37 - lipo (a) 03/27/23 was 54.2  7: Tobacco use- - smoking 1.5 ppd of cigarettes - states that she is considering stopping smoking  because of her health - cessation encouraged   Return in 1 month, sooner if needed.   Clarisa Kindred, FNP

## 2023-07-28 ENCOUNTER — Inpatient Hospital Stay

## 2023-07-28 ENCOUNTER — Ambulatory Visit: Attending: Family | Admitting: Family

## 2023-07-28 ENCOUNTER — Encounter: Payer: Self-pay | Admitting: Family

## 2023-07-28 ENCOUNTER — Other Ambulatory Visit: Payer: Self-pay | Admitting: *Deleted

## 2023-07-28 VITALS — BP 121/50 | HR 64 | Wt 168.0 lb

## 2023-07-28 DIAGNOSIS — Z7984 Long term (current) use of oral hypoglycemic drugs: Secondary | ICD-10-CM | POA: Diagnosis not present

## 2023-07-28 DIAGNOSIS — I10 Essential (primary) hypertension: Secondary | ICD-10-CM | POA: Diagnosis not present

## 2023-07-28 DIAGNOSIS — J449 Chronic obstructive pulmonary disease, unspecified: Secondary | ICD-10-CM | POA: Diagnosis not present

## 2023-07-28 DIAGNOSIS — E039 Hypothyroidism, unspecified: Secondary | ICD-10-CM | POA: Insufficient documentation

## 2023-07-28 DIAGNOSIS — I130 Hypertensive heart and chronic kidney disease with heart failure and stage 1 through stage 4 chronic kidney disease, or unspecified chronic kidney disease: Secondary | ICD-10-CM | POA: Insufficient documentation

## 2023-07-28 DIAGNOSIS — I2510 Atherosclerotic heart disease of native coronary artery without angina pectoris: Secondary | ICD-10-CM | POA: Diagnosis not present

## 2023-07-28 DIAGNOSIS — D631 Anemia in chronic kidney disease: Secondary | ICD-10-CM | POA: Diagnosis not present

## 2023-07-28 DIAGNOSIS — Z955 Presence of coronary angioplasty implant and graft: Secondary | ICD-10-CM | POA: Diagnosis not present

## 2023-07-28 DIAGNOSIS — N183 Chronic kidney disease, stage 3 unspecified: Secondary | ICD-10-CM | POA: Diagnosis not present

## 2023-07-28 DIAGNOSIS — E782 Mixed hyperlipidemia: Secondary | ICD-10-CM

## 2023-07-28 DIAGNOSIS — Z79899 Other long term (current) drug therapy: Secondary | ICD-10-CM | POA: Insufficient documentation

## 2023-07-28 DIAGNOSIS — I272 Pulmonary hypertension, unspecified: Secondary | ICD-10-CM | POA: Diagnosis not present

## 2023-07-28 DIAGNOSIS — E785 Hyperlipidemia, unspecified: Secondary | ICD-10-CM | POA: Insufficient documentation

## 2023-07-28 DIAGNOSIS — K219 Gastro-esophageal reflux disease without esophagitis: Secondary | ICD-10-CM | POA: Diagnosis not present

## 2023-07-28 DIAGNOSIS — E1151 Type 2 diabetes mellitus with diabetic peripheral angiopathy without gangrene: Secondary | ICD-10-CM | POA: Insufficient documentation

## 2023-07-28 DIAGNOSIS — F17210 Nicotine dependence, cigarettes, uncomplicated: Secondary | ICD-10-CM | POA: Insufficient documentation

## 2023-07-28 DIAGNOSIS — Z5986 Financial insecurity: Secondary | ICD-10-CM | POA: Insufficient documentation

## 2023-07-28 DIAGNOSIS — Z79890 Hormone replacement therapy (postmenopausal): Secondary | ICD-10-CM | POA: Insufficient documentation

## 2023-07-28 DIAGNOSIS — E1122 Type 2 diabetes mellitus with diabetic chronic kidney disease: Secondary | ICD-10-CM | POA: Insufficient documentation

## 2023-07-28 DIAGNOSIS — I5032 Chronic diastolic (congestive) heart failure: Secondary | ICD-10-CM | POA: Diagnosis present

## 2023-07-28 DIAGNOSIS — D649 Anemia, unspecified: Secondary | ICD-10-CM

## 2023-07-28 DIAGNOSIS — Z9981 Dependence on supplemental oxygen: Secondary | ICD-10-CM | POA: Diagnosis not present

## 2023-07-28 DIAGNOSIS — Z7982 Long term (current) use of aspirin: Secondary | ICD-10-CM | POA: Diagnosis not present

## 2023-07-28 DIAGNOSIS — Z720 Tobacco use: Secondary | ICD-10-CM

## 2023-07-28 DIAGNOSIS — Z7902 Long term (current) use of antithrombotics/antiplatelets: Secondary | ICD-10-CM | POA: Diagnosis not present

## 2023-07-28 NOTE — Progress Notes (Signed)
 [facility] - HEART FAILURE CLINIC - PHARMACIST COUNSELING NOTE  Adherence Assessment  Do you ever forget to take your medication? [] Yes [x] No  Do you ever skip doses due to side effects? Swelling , Sob , cough, dizziness  [] Yes [x] No  Do you have trouble affording your medicines? [] Yes [x] No  Are you ever unable to pick up your medication due to transportation difficulties? [] Yes [x] No  Do you ever stop taking your medications because you don't believe they are helping? [] Yes [x] No  Do you check your weight daily?  [x] Yes [] No  Do you check your blood pressure daily? Typical reading: 110-120/ 30-40s [x] Yes [] No  Adherence strategy: Declined pill box  Barriers to obtaining medications: None reported    Vital signs: HR 64, BP 121/50, weight 168 lbs. ECHO: Date 03/09/2023, EF 60-65% Renal function: Date 07/21/23, GFR 33  Current Guideline-Directed Medical Therapy/Evidence Based Medicine  ACE/ARB/ARNI: N/A Target dose: N/A  Beta Blocker: Bisoprolol 5 mg daily  Target dose: 10 mg daily   Aldosterone Antagonist: Spironolactone 12.5 mg daily Target dose: 50 mg daily   SGLT2i: Empagliflozin 10 mg daily Target dose: On target dose  Diuretic: Torsemide 40 mg twice daily  ASSESSMENT 68 year old female who presents to the HF clinic for a follow-up appointment. PMH is significant for COPD, HTN, hyperlipidemia, mild pHTN, GERD, subclavian artery stenosis (stented 07/2017), CKD, pulmonary HTN, CAD, iron deficiency anemia, hypothyroidism, PVD, tobacco use and chronic heart failure. Has not taken any medications yet today and remains off the spironolactone. Patient's cardiologist increased torsemide dose to 40 mg daily at recent visit on 07/15/23 which was meant to be continued through the weekend or until she reached 160 lbs (whichever came first). Patient reports she is still taking 40 mg daily.   Recent ED visit or hospitalization (past 6 months):  Date: 07/06/23 -  07/08/23, CC: loss of consciousness, Admission Dx: anemia & hypokalemia  PLAN Continue current regimen as directed by NP ReDS 33% at today's visit  Plan to resume spironolactone at lower dose of 12.5 mg daily   Time spent: 20 minutes   Littie Deeds, PharmD Pharmacy Resident  07/28/2023 8:17 AM

## 2023-07-28 NOTE — Patient Instructions (Signed)
 Medication Changes:  RESUME Spironolactone 12.5mg  (1/2 tab) daily  Lab Work:  Go DOWN to LOWER LEVEL (LL) to have your blood work completed inside of [employer] office. TODAY  Go over to the MEDICAL MALL. Go pass the gift shop and have your blood work completed. IN ONE WEEK.  We will only call you if the results are abnormal or if the provider would like to make medication changes.    Special Instructions // Education:  Do the following things EVERYDAY: Weigh yourself in the morning before breakfast. Write it down and keep it in a log. Take your medicines as prescribed Eat low salt foods--Limit salt (sodium) to 2000 mg per day.  Stay as active as you can everyday Limit all fluids for the day to less than 2 liters PLEASE WEAR COMPRESSION SOCKS DAILY    Follow-Up in: Please follow up with the Heart Failure Clinic in 1 month with Clarisa Kindred, NP.  At the [facility] Failure [facility], you and your health needs are our priority. We have a designated team specialized in the treatment of Heart Failure. This Care Team includes your primary Heart Failure Specialized Cardiologist (physician), Advanced Practice Providers (APPs- Physician Assistants and Nurse Practitioners), and Pharmacist who all work together to provide you with the care you need, when you need it.   You may see any of the following providers on your designated Care Team at your next follow up:  Dr. Arvilla Meres Dr. Marca Ancona Dr. Dorthula Nettles Dr. Theresia Bough Clarisa Kindred, FNP Enos Fling, RPH-CPP  Please be sure to bring in all your medications bottles to every appointment.   Need to Contact us:  If you have any questions or concerns before your next appointment please send [place] a message through [place] or call our office at [phone].    TO LEAVE A MESSAGE FOR THE NURSE SELECT OPTION 2, PLEASE LEAVE A MESSAGE INCLUDING: YOUR NAME DATE OF BIRTH CALL BACK NUMBER REASON FOR CALL**this is important as  we prioritize the call backs  YOU WILL RECEIVE A CALL BACK THE SAME DAY AS LONG AS YOU CALL BEFORE 4:00 PM

## 2023-07-28 NOTE — Progress Notes (Signed)
 ReDS Vest / Clip - 07/28/23 1014       ReDS Vest / Clip   Station Marker B    Ruler Value 32    ReDS Value Range Low volume    ReDS Actual Value 33

## 2023-07-29 ENCOUNTER — Encounter: Payer: Self-pay | Admitting: Family

## 2023-07-29 ENCOUNTER — Inpatient Hospital Stay: Payer: Medicare Other

## 2023-07-29 ENCOUNTER — Inpatient Hospital Stay: Payer: Medicare Other | Admitting: Internal Medicine

## 2023-07-29 ENCOUNTER — Encounter: Payer: Self-pay | Admitting: Internal Medicine

## 2023-07-29 VITALS — BP 136/50 | HR 64 | Temp 98.2°F | Resp 18 | Wt 168.2 lb

## 2023-07-29 VITALS — BP 127/52 | HR 63 | Temp 97.6°F | Resp 16

## 2023-07-29 DIAGNOSIS — D649 Anemia, unspecified: Secondary | ICD-10-CM

## 2023-07-29 DIAGNOSIS — D509 Iron deficiency anemia, unspecified: Secondary | ICD-10-CM | POA: Diagnosis not present

## 2023-07-29 LAB — BASIC METABOLIC PANEL - CANCER CENTER ONLY
Anion gap: 11 (ref 5–15)
BUN: 25 mg/dL — ABNORMAL HIGH (ref 8–23)
CO2: 29 mmol/L (ref 22–32)
Calcium: 8.7 mg/dL — ABNORMAL LOW (ref 8.9–10.3)
Chloride: 96 mmol/L — ABNORMAL LOW (ref 98–111)
Creatinine: 1.56 mg/dL — ABNORMAL HIGH (ref 0.44–1.00)
GFR, Estimated: 36 mL/min — ABNORMAL LOW (ref >60)
Glucose, Bld: 123 mg/dL — ABNORMAL HIGH (ref 70–99)
Potassium: 3.7 mmol/L (ref 3.5–5.1)
Sodium: 136 mmol/L (ref 135–145)

## 2023-07-29 LAB — BASIC METABOLIC PANEL WITH GFR
BUN/Creatinine Ratio: 16 (ref 12–28)
BUN: 25 mg/dL (ref 8–27)
CO2: 28 mmol/L (ref 20–29)
Calcium: 9.5 mg/dL (ref 8.7–10.3)
Chloride: 98 mmol/L (ref 96–106)
Creatinine, Ser: 1.59 mg/dL — ABNORMAL HIGH (ref 0.57–1.00)
Glucose: 96 mg/dL (ref 70–99)
Potassium: 3.9 mmol/L (ref 3.5–5.2)
Sodium: 143 mmol/L (ref 134–144)
eGFR: 35 mL/min/{1.73_m2} — ABNORMAL LOW (ref >59)

## 2023-07-29 LAB — CBC WITH DIFFERENTIAL (CANCER CENTER ONLY)
Abs Immature Granulocytes: 0.05 10*3/uL (ref 0.00–0.07)
Basophils Absolute: 0 10*3/uL (ref 0.0–0.1)
Basophils Relative: 1 %
Eosinophils Absolute: 0.2 10*3/uL (ref 0.0–0.5)
Eosinophils Relative: 3 %
HCT: 32 % — ABNORMAL LOW (ref 36.0–46.0)
Hemoglobin: 8.8 g/dL — ABNORMAL LOW (ref 12.0–15.0)
Immature Granulocytes: 1 %
Lymphocytes Relative: 18 %
Lymphs Abs: 1.3 10*3/uL (ref 0.7–4.0)
MCH: 25.7 pg — ABNORMAL LOW (ref 26.0–34.0)
MCHC: 27.5 g/dL — ABNORMAL LOW (ref 30.0–36.0)
MCV: 93.6 fL (ref 80.0–100.0)
Monocytes Absolute: 0.5 10*3/uL (ref 0.1–1.0)
Monocytes Relative: 7 %
Neutro Abs: 5.1 10*3/uL (ref 1.7–7.7)
Neutrophils Relative %: 70 %
Platelet Count: 265 10*3/uL (ref 150–400)
RBC: 3.42 MIL/uL — ABNORMAL LOW (ref 3.87–5.11)
RDW: 22.5 % — ABNORMAL HIGH (ref 11.5–15.5)
WBC Count: 7.2 10*3/uL (ref 4.0–10.5)
nRBC: 0 % (ref 0.0–0.2)

## 2023-07-29 LAB — SAMPLE TO BLOOD BANK

## 2023-07-29 LAB — IRON AND TIBC
Iron: 27 ug/dL — ABNORMAL LOW (ref 28–170)
Saturation Ratios: 7 % — ABNORMAL LOW (ref 10.4–31.8)
TIBC: 386 ug/dL (ref 250–450)
UIBC: 359 ug/dL

## 2023-07-29 LAB — FERRITIN: Ferritin: 38 ng/mL (ref 11–307)

## 2023-07-29 MED ORDER — IRON SUCROSE 20 MG/ML IV SOLN
200.0000 mg | Freq: Once | INTRAVENOUS | Status: AC
  Administered 2023-07-29: 200 mg via INTRAVENOUS
  Filled 2023-07-29: qty 10

## 2023-07-29 NOTE — Assessment & Plan Note (Addendum)
#   Anemia- Hb-8.3; Ferritin- 6 [PCP- AUG 2024.]- WBC/platelets are normal. Patient is symptomatic.  Likely due to iron deficiency - from etiology GI blood loss/ CKD- III.  NOT  gentle iron [ constipation] .  Stopped iron.   #  Currently s/p Venofer; Hb 8.3 - proceed with venofer-if no significant improvement noted on Venofer would recommend erythropoietin injection.  #Etiology of iron deficiency:s/p AUG 2024-[[facility]]-EGD-duodenal ulcer;  colonoscopy- multiple polyps; JULY 2024- CT scan abdomen pelvis- no acute process.  AWAITING capsule study.   # CAD s/p stent- CHF [dec 2024; march 2025, Dr.Arida] asprin + plavix  # CKD- III [PCP]/ DM - on Toesermide/Spirinolactine weight gain- defer to PCP.    # DISPOSITION: # venfoer today  # venofer weekly x 3 more  # Follow up 2 months- MD; labs- cbc/bmp;  possible venofer-  Dr.B

## 2023-07-29 NOTE — Progress Notes (Signed)
 Misty Adams  Patient Care Team: Danella Penton, MD as PCP - General (Internal Medicine) Iran Ouch, MD as PCP - Cardiology (Cardiology) Earna Coder, MD as Consulting Physician (Oncology)  CHIEF COMPLAINTS/PURPOSE OF CONSULTATION: ANEMIA   HEMATOLOGY HISTORY  # ANEMIA[Hb; MCV-platelets- WBC; Iron sat; ferritin;  Kidney: stage III CKD- 30-40s.  CT/US- ;  colo/EGD: 2022;  EGD/colo- awaiting repeating EGD/Dr.Russow-   COPD with ongoing tobacco abuse, abnormal CT chest consistent with interstitial lung disease with recent pulmonary consultation, depression, diabetes, hypertension, PAD  HISTORY OF PRESENTING ILLNESS: Patient ambulating-independently. Alone.   Misty Adams 68 y.o.  female pleasant patient with multiple medical problems-including CKD stage III CAD on aspirin Plavix and CHF and history of severeiron deficient anemia is here for follow-up.  Reports no energy. Dyspneic getting up and down takes a lot of effort. Hgb lives around 8 now. Going for a capsule study tomorrow. Appetite is good. Chronic constipation- not taking taking sec to constipation.   Patient denies any blood in stools.  Denies any black loose stool.  Patient s/p EGD colonoscopy. Awaiting capsule study.    Review of Systems  Constitutional:  Positive for malaise/fatigue. Negative for chills, diaphoresis, fever and weight loss.  HENT:  Negative for nosebleeds and sore throat.   Eyes:  Negative for double vision.  Respiratory:  Negative for cough, hemoptysis, sputum production, shortness of breath and wheezing.   Cardiovascular:  Negative for chest pain, palpitations, orthopnea and leg swelling.  Gastrointestinal:  Negative for abdominal pain, blood in stool, constipation, diarrhea, heartburn, melena, nausea and vomiting.  Genitourinary:  Negative for dysuria, frequency and urgency.  Musculoskeletal:  Negative for back pain and joint pain.  Skin: Negative.  Negative  for itching and rash.  Neurological:  Negative for dizziness, tingling, focal weakness, weakness and headaches.  Endo/Heme/Allergies:  Does not bruise/bleed easily.  Psychiatric/Behavioral:  Negative for depression. The patient is not nervous/anxious and does not have insomnia.    MEDICAL HISTORY:  Past Medical History:  Diagnosis Date   Allergy    Anxiety    Chronic heart failure with preserved ejection fraction (HFpEF) (HCC)    a. 02/2023 Echo: EF 60-65%, no rwma, GrII DD, nl RV size/fxn, mild LAE, mild MS (mean grad ), mod MAC, mild AS (mean grad 9.46mmHg).   CKD (chronic kidney disease), stage III (HCC)    COPD (chronic obstructive pulmonary disease) (HCC)    Depression    Diabetes mellitus without complication (HCC)    Hyperlipidemia    Hypertension    Hypothyroidism    Interstitial lung disease (HCC)    PAD (peripheral artery disease) (HCC)    a. 07/2017 L subclavian stenosis s/p stenting.   Psoriasis    Substance abuse (HCC)    Tobacco abuse     SURGICAL HISTORY: Past Surgical History:  Procedure Laterality Date   ABDOMINAL HYSTERECTOMY     AORTIC ARCH ANGIOGRAPHY N/A 08/06/2017   Procedure: AORTIC ARCH ANGIOGRAPHY;  Surgeon: Fransisco Hertz, MD;  Location: [facility] INVASIVE CV LAB;  Service: Cardiovascular;  Laterality: N/A;   APPENDECTOMY     BIOPSY  12/19/2022   Procedure: BIOPSY;  Surgeon: Jaynie Collins, DO;  Location: [facility] ENDOSCOPY;  Service: Gastroenterology;;   CESAREAN SECTION     COLONOSCOPY     COLONOSCOPY WITH PROPOFOL N/A 12/07/2020   Procedure: COLONOSCOPY WITH PROPOFOL;  Surgeon: Jaynie Collins, DO;  Location: [facility] ENDOSCOPY;  Service: Gastroenterology;  Laterality: N/A;  COLONOSCOPY WITH PROPOFOL N/A 12/19/2022   Procedure: COLONOSCOPY WITH PROPOFOL;  Surgeon: Jaynie Collins, DO;  Location: [facility] ENDOSCOPY;  Service: Gastroenterology;  Laterality: N/A;   CORONARY STENT INTERVENTION N/A 03/26/2023   Procedure: CORONARY STENT INTERVENTION;   Surgeon: Marykay Lex, MD;  Location: [facility] INVASIVE CV LAB;  Service: Cardiovascular;  Laterality: N/A;   dilatation and curettage     DILATION AND CURETTAGE OF UTERUS     ESOPHAGOGASTRODUODENOSCOPY N/A 12/07/2020   Procedure: ESOPHAGOGASTRODUODENOSCOPY (EGD);  Surgeon: Jaynie Collins, DO;  Location: [facility] ENDOSCOPY;  Service: Gastroenterology;  Laterality: N/A;   ESOPHAGOGASTRODUODENOSCOPY (EGD) WITH PROPOFOL N/A 12/19/2022   Procedure: ESOPHAGOGASTRODUODENOSCOPY (EGD) WITH PROPOFOL;  Surgeon: Jaynie Collins, DO;  Location: [facility] ENDOSCOPY;  Service: Gastroenterology;  Laterality: N/A;   KNEE SURGERY     left wrist ganglion cyst removal     OOPHORECTOMY  1994   PERIPHERAL VASCULAR INTERVENTION Left 08/06/2017   Procedure: PERIPHERAL VASCULAR INTERVENTION;  Surgeon: Fransisco Hertz, MD;  Location: [facility] INVASIVE CV LAB;  Service: Cardiovascular;  Laterality: Left;  subclavian   POLYPECTOMY  12/19/2022   Procedure: POLYPECTOMY;  Surgeon: Jaynie Collins, DO;  Location: [facility] ENDOSCOPY;  Service: Gastroenterology;;   RIGHT/LEFT HEART CATH AND CORONARY ANGIOGRAPHY N/A 03/26/2023   Procedure: RIGHT/LEFT HEART CATH AND CORONARY ANGIOGRAPHY;  Surgeon: Marykay Lex, MD;  Location: [facility] INVASIVE CV LAB;  Service: Cardiovascular;  Laterality: N/A;   TUBAL LIGATION      SOCIAL HISTORY: Social History   Socioeconomic History   Marital status: Divorced    Spouse name: Not on file   Number of children: 5   Years of education: Not on file   Highest education level: Associate degree: academic program  Occupational History   Occupation: Retired  Tobacco Use   Smoking status: Every Day    Current packs/day: 2.00    Average packs/day: 2.0 packs/day for 50.0 years (100.0 ttl pk-yrs)    Types: Cigarettes   Smokeless tobacco: Never  Vaping Use   Vaping status: Former   Substances: Nicotine  Substance and Sexual Activity   Alcohol use: Yes   Drug use: Not Currently   Sexual  activity: Not Currently  Other Topics Concern   Not on file  Social History Narrative   Lives w/ 3 of her children   Social Drivers of Health   Financial Resource Strain: High Risk (05/07/2023)   Received from [facility]   Overall Financial Resource Strain (CARDIA)    Difficulty of Paying Living Expenses: Hard  Food Insecurity: No Food Insecurity (07/07/2023)   Hunger Vital Sign    Worried About Running Out of Food in the Last Year: Never true    Ran Out of Food in the Last Year: Never true  Recent Concern: Food Insecurity - Food Insecurity Present (05/07/2023)   Received from [facility]   Hunger Vital Sign    Worried About Running Out of Food in the Last Year: Sometimes true    Ran Out of Food in the Last Year: Sometimes true  Transportation Needs: No Transportation Needs (07/07/2023)   PRAPARE - Administrator, Civil Service (Medical): No    Lack of Transportation (Non-Medical): No  Physical Activity: Not on file  Stress: Not on file  Social Connections: Moderately Isolated (07/07/2023)   Social Connection and Isolation Panel [NHANES]    Frequency of Communication with Friends and Family: More than three times a week    Frequency  of Social Gatherings with Friends and Family: More than three times a week    Attends Religious Services: Never    Database administrator or Organizations: No    Attends Engineer, structural: 1 to 4 times per year    Marital Status: Divorced  Catering manager Violence: Not At Risk (07/07/2023)   Humiliation, Afraid, Rape, and Kick questionnaire    Fear of Current or Ex-Partner: No    Emotionally Abused: No    Physically Abused: No    Sexually Abused: No    FAMILY HISTORY: Family History  Problem Relation Age of Onset   Uterine cancer Mother    Stroke Mother    Aneurysm Mother    Heart attack Father    Coronary artery disease Father    Leukemia Father    Depression Father    Diabetes  Father    Hyperlipidemia Father    Hypertension Father    Colon polyps Sister    Obesity Sister    Thyroid disease Sister    COPD Sister    Lung cancer Sister    Breast cancer Maternal Aunt    Breast cancer Maternal Aunt     ALLERGIES:  is allergic to hydrocodone-acetaminophen, vicodin [hydrocodone-acetaminophen], and trelegy ellipta [fluticasone-umeclidin-vilant].  MEDICATIONS:  Current Outpatient Medications  Medication Sig Dispense Refill   ACCU-CHEK GUIDE TEST test strip      Accu-Chek Softclix Lancets lancets SMARTSIG:Lancet Topical     acetaminophen (TYLENOL) 500 MG tablet Take 1,000 mg by mouth every 6 (six) hours as needed for moderate pain or headache.     albuterol (VENTOLIN HFA) 108 (90 Base) MCG/ACT inhaler Inhale 2 puffs into the lungs every 6 (six) hours as needed for wheezing or shortness of breath. 1 each 3   ALPRAZolam (XANAX) 0.5 MG tablet Take 0.5 mg by mouth 2 (two) times daily as needed for anxiety.      aspirin 81 MG tablet Take 81 mg by mouth at bedtime.      atorvastatin (LIPITOR) 40 MG tablet Take 1 tablet (40 mg total) by mouth at bedtime. 30 tablet 0   bisoprolol (ZEBETA) 5 MG tablet TAKE 1 TABLET (5 MG TOTAL) BY MOUTH DAILY. 30 tablet 5   clopidogrel (PLAVIX) 75 MG tablet Take 1 tablet (75 mg total) by mouth daily with breakfast. 90 tablet 3   empagliflozin (JARDIANCE) 10 MG TABS tablet Take 1 tablet (10 mg total) by mouth daily. 30 tablet 1   ezetimibe (ZETIA) 10 MG tablet TAKE 1 TABLET BY MOUTH EVERY DAY 30 tablet 5   gabapentin (NEURONTIN) 100 MG capsule Take 200 mg by mouth at bedtime.     levothyroxine (SYNTHROID, LEVOTHROID) 125 MCG tablet Take 125 mcg by mouth daily.      OXYGEN Inhale 2 L/L into the lungs at bedtime.     pantoprazole (PROTONIX) 40 MG tablet Take 40 mg by mouth 2 (two) times daily.     potassium chloride SA (KLOR-CON M20) 20 MEQ tablet Take 40 mEq by mouth 2 (two) times daily.     spironolactone (ALDACTONE) 25 MG tablet TAKE 1  TABLET (25 MG TOTAL) BY MOUTH DAILY. (Patient taking differently: Take 12.5 mg by mouth daily.) 30 tablet 5   torsemide (DEMADEX) 20 MG tablet Take 40 mg by mouth daily.     traZODone (DESYREL) 100 MG tablet Take 100 mg by mouth daily as needed for sleep.     guaiFENesin-dextromethorphan (ROBITUSSIN DM) 100-10 MG/5ML syrup Take 10 mLs  by mouth every 4 (four) hours as needed for cough. (Patient not taking: Reported on 07/29/2023) 118 mL 0   ondansetron (ZOFRAN) 4 MG tablet Take 4 mg by mouth every 4 (four) hours as needed for nausea or vomiting. (Patient not taking: Reported on 07/29/2023)     oxyCODONE-acetaminophen (PERCOCET) 5-325 MG tablet Take 1 tablet by mouth every 4 (four) hours as needed for severe pain (pain score 7-10). (Patient not taking: Reported on 07/29/2023) 20 tablet 0   No current facility-administered medications for this visit.     PHYSICAL EXAMINATION:   Vitals:   07/29/23 1308  BP: (!) 136/50  Pulse: 64  Resp: 18  Temp: 98.2 F (36.8 C)  SpO2: 100%     Filed Weights   07/29/23 1308  Weight: 168 lb 3.2 oz (76.3 kg)      Physical Exam Vitals and nursing Adams reviewed.  HENT:     Head: Normocephalic and atraumatic.     Mouth/Throat:     Pharynx: Oropharynx is clear.  Eyes:     Extraocular Movements: Extraocular movements intact.     Pupils: Pupils are equal, round, and reactive to light.  Cardiovascular:     Rate and Rhythm: Normal rate and regular rhythm.  Pulmonary:     Comments: Decreased breath sounds bilaterally.  Abdominal:     Palpations: Abdomen is soft.  Musculoskeletal:        General: Normal range of motion.     Cervical back: Normal range of motion.  Skin:    General: Skin is warm.  Neurological:     General: No focal deficit present.     Mental Status: She is alert and oriented to person, place, and time.  Psychiatric:        Behavior: Behavior normal.        Judgment: Judgment normal.      LABORATORY DATA:  I have reviewed the  data as listed Lab Results  Component Value Date   WBC 7.2 07/29/2023   HGB 8.8 (L) 07/29/2023   HCT 32.0 (L) 07/29/2023   MCV 93.6 07/29/2023   PLT 265 07/29/2023   Recent Labs    12/08/22 1446 01/21/23 1346 03/08/23 2325 03/09/23 0648 07/06/23 2211 07/07/23 0207 07/08/23 0518 07/15/23 1055 07/21/23 1412 07/28/23 1111 07/29/23 1249  NA 134*   < > 139   < > 134*   < > 142   < > 136 143 136  K 3.1*   < > 4.3   < > 2.4*   < > 4.4   < > 3.3* 3.9 3.7  CL 92*   < > 103   < > 94*   < > 112*   < > 97* 98 96*  CO2 31   < > 26   < > 27   < > 25   < > 30 28 29   GLUCOSE 118*   < > 147*   < > 137*   < > 97   < > 103* 96 123*  BUN 25*   < > 50*   < > 69*   < > 44*   < > 31* 25 25*  CREATININE 1.38*   < > 2.01*   < > 2.15*   < > 1.59*   < > 1.64* 1.59* 1.56*  CALCIUM 9.3   < > 8.9   < > 9.4   < > 8.8*   < > 8.9 9.5 8.7*  GFRNONAA 41*   < >  26*   < > 24*   < > 35*  --  33*  --  36*  PROT 7.5  --  7.2  --  6.6  --   --   --   --   --   --   ALBUMIN 3.7  --  3.4*  --  3.5  --   --   --   --   --   --   AST 14*  --  15  --  10*  --   --   --   --   --   --   ALT 11  --  21  --  9  --   --   --   --   --   --   ALKPHOS 67  --  80  --  55  --   --   --   --   --   --   BILITOT 0.4  --  0.9  --  0.6  --   --   --   --   --   --    < > = values in this interval not displayed.     CT Lumbar Spine Wo Contrast Result Date: 07/06/2023 CLINICAL DATA:  Fall injury with low back pain. EXAM: CT LUMBAR SPINE WITHOUT CONTRAST TECHNIQUE: Multidetector CT imaging of the lumbar spine was performed without intravenous contrast administration. Multiplanar CT image reconstructions were also generated. RADIATION DOSE REDUCTION: This exam was performed according to the departmental dose-optimization program which includes automated exposure control, adjustment of the mA and/or kV according to patient size and/or use of iterative reconstruction technique. COMPARISON:  CT abdomen pelvis without contrast 10/16/2022.  FINDINGS: Segmentation: Standard. Alignment: There is a mild chronic levoscoliosis, and mild chronic grade 1 L5-S1 anterolisthesis, the latter likely related to facet hypertrophy. No other AP listhesis is seen. Vertebrae: No acute fracture is evident or focal pathologic process. The bones are mildly demineralized. The SI joints are patent with vacuum phenomenon and mild spurring. No sacral insufficiency fracture is seen. Paraspinal and other soft tissues: Moderate to heavy aortoiliac and visceral branch vessel atherosclerosis. Stable 3 cm fusiform infrarenal AAA. Stable left adrenal adenomas. No paraspinal hematoma or mass. Disc levels: The lumbar discs are preserved in heights. There is a left far lateral disc bulge L2-3 but no significant posterior bulge and no herniation or stenosis. At L3-4 there is a mild diffuse annular bulge but no herniation or canal stenosis. There is mild facet spurring without foraminal stenosis. At L4-5, there is a mild left paracentral disc bulge without herniation or stenosis. there Is mild facet hypertrophy without foraminal compromise. At L5-S1 there is advanced left and moderate right facet hypertrophy, grade 1 spondylolisthesis. There is mild posterior disc bulge without herniation or canal stenosis. The foramina are mildly stenotic. IMPRESSION: 1. Osteopenia, scoliosis and degenerative changes without evidence of fracture. 2. Grade 1 degenerative anterolisthesis chronically at L5-S1. 3. Multilevel disc bulges with herniation or canal stenosis. 4. Mild foraminal stenosis L5-S1. 5. Pole aortic atherosclerosis with stable 3 cm infrarenal AAA. Electronically Signed   By: Almira Bar M.D.   On: 07/06/2023 23:24   CT Head Wo Contrast Result Date: 07/06/2023 CLINICAL DATA:  Fall injury with head trauma and low back pain. EXAM: CT HEAD WITHOUT CONTRAST TECHNIQUE: Contiguous axial images were obtained from the base of the skull through the vertex without intravenous contrast.  RADIATION DOSE REDUCTION: This exam was performed according to  the departmental dose-optimization program which includes automated exposure control, adjustment of the mA and/or kV according to patient size and/or use of iterative reconstruction technique. COMPARISON:  MRI brain 04/24/2022. FINDINGS: Brain: There is mild cerebral atrophy and small vessel disease with unremarkable cerebellum and brainstem. No acute cortical based infarct, hemorrhage, mass, mass effect or midline shift are seen. The ventricles are normal in size and position. There are tiny chronic lacunar infarcts in both external capsules. Basal cisterns are patent. Vascular: There are scattered calcifications in both siphons. No hyperdense central vessel is seen. Skull: Negative for fractures or focal lesions. Sinuses/Orbits: No acute findings. Clear sinuses and mastoids. Negative orbits. Other: None. IMPRESSION: No acute intracranial CT findings, depressed skull fractures or interval changes. Chronic changes. Electronically Signed   By: Almira Bar M.D.   On: 07/06/2023 23:00    ASSESSMENT & PLAN:   Acute on chronic anemia # Anemia- Hb-8.3; Ferritin- 6 [PCP- AUG 2024.]- WBC/platelets are normal. Patient is symptomatic.  Likely due to iron deficiency - from etiology GI blood loss/ CKD- III.  NOT  gentle iron [ constipation] .  Stopped iron.   #  Currently s/p Venofer; Hb 8.3 - proceed with venofer-if no significant improvement noted on Venofer would recommend erythropoietin injection.  #Etiology of iron deficiency:s/p AUG 2024-[[facility]]-EGD-duodenal ulcer;  colonoscopy- multiple polyps; JULY 2024- CT scan abdomen pelvis- no acute process.  AWAITING capsule study.   # CAD s/p stent- CHF [dec 2024; march 2025, Dr.Arida] asprin + plavix  # CKD- III [PCP]/ DM - on Toesermide/Spirinolactine weight gain- defer to PCP.    # DISPOSITION: # venfoer today  # venofer weekly x 3 more  # Follow up 2 months- MD; labs- cbc/bmp;  possible  venofer-  Dr.B  All questions were answered. The patient knows to call the clinic with any problems, questions or concerns.   Earna Coder, MD 07/29/2023 2:06 PM

## 2023-07-29 NOTE — Progress Notes (Signed)
 Reports no energy. Dyspneic getting up and down takes a lot of effort. Hgb lives around 8 now. Going for a capsule study tomorrow. Appetite is good. Chronic constipation. Backside hurts from her previous fall.

## 2023-08-05 ENCOUNTER — Encounter: Payer: Self-pay | Admitting: Family

## 2023-08-05 ENCOUNTER — Inpatient Hospital Stay

## 2023-08-05 VITALS — BP 130/53 | HR 62 | Temp 97.0°F | Resp 16

## 2023-08-05 DIAGNOSIS — D509 Iron deficiency anemia, unspecified: Secondary | ICD-10-CM | POA: Diagnosis not present

## 2023-08-05 DIAGNOSIS — D649 Anemia, unspecified: Secondary | ICD-10-CM

## 2023-08-05 MED ORDER — IRON SUCROSE 20 MG/ML IV SOLN
200.0000 mg | Freq: Once | INTRAVENOUS | Status: AC
  Administered 2023-08-05: 200 mg via INTRAVENOUS

## 2023-08-05 NOTE — Patient Instructions (Signed)

## 2023-08-06 MED ORDER — TORSEMIDE 20 MG PO TABS: 40.0000 mg | ORAL_TABLET | Freq: Every day | ORAL | 5 refills | Status: DC

## 2023-08-12 ENCOUNTER — Inpatient Hospital Stay

## 2023-08-12 VITALS — BP 117/61 | HR 65 | Temp 98.2°F | Resp 18

## 2023-08-12 DIAGNOSIS — D649 Anemia, unspecified: Secondary | ICD-10-CM

## 2023-08-12 DIAGNOSIS — D509 Iron deficiency anemia, unspecified: Secondary | ICD-10-CM | POA: Diagnosis not present

## 2023-08-12 MED ORDER — IRON SUCROSE 20 MG/ML IV SOLN
200.0000 mg | Freq: Once | INTRAVENOUS | Status: AC
  Administered 2023-08-12: 200 mg via INTRAVENOUS

## 2023-08-12 NOTE — Patient Instructions (Signed)

## 2023-08-19 ENCOUNTER — Inpatient Hospital Stay

## 2023-08-19 VITALS — BP 110/33 | HR 55 | Temp 98.1°F | Resp 18

## 2023-08-19 DIAGNOSIS — D649 Anemia, unspecified: Secondary | ICD-10-CM

## 2023-08-19 DIAGNOSIS — D509 Iron deficiency anemia, unspecified: Secondary | ICD-10-CM | POA: Diagnosis not present

## 2023-08-19 MED ORDER — SODIUM CHLORIDE 0.9% FLUSH
10.0000 mL | Freq: Once | INTRAVENOUS | Status: DC | PRN
  Filled 2023-08-19: qty 10

## 2023-08-19 MED ORDER — IRON SUCROSE 20 MG/ML IV SOLN
200.0000 mg | Freq: Once | INTRAVENOUS | Status: AC
  Administered 2023-08-19: 200 mg via INTRAVENOUS
  Filled 2023-08-19: qty 10

## 2023-08-19 NOTE — Progress Notes (Signed)
 Declined post-observation. Aware of risks. Vitals stable at discharge.

## 2023-08-19 NOTE — Patient Instructions (Signed)

## 2023-08-20 ENCOUNTER — Encounter: Payer: Self-pay | Admitting: Internal Medicine

## 2023-08-25 ENCOUNTER — Encounter: Payer: Self-pay | Admitting: Dermatology

## 2023-08-25 ENCOUNTER — Ambulatory Visit: Admitting: Dermatology

## 2023-08-25 DIAGNOSIS — Z85828 Personal history of other malignant neoplasm of skin: Secondary | ICD-10-CM | POA: Diagnosis not present

## 2023-08-25 DIAGNOSIS — D492 Neoplasm of unspecified behavior of bone, soft tissue, and skin: Secondary | ICD-10-CM | POA: Diagnosis not present

## 2023-08-25 DIAGNOSIS — W908XXA Exposure to other nonionizing radiation, initial encounter: Secondary | ICD-10-CM | POA: Diagnosis not present

## 2023-08-25 DIAGNOSIS — L821 Other seborrheic keratosis: Secondary | ICD-10-CM

## 2023-08-25 DIAGNOSIS — D0439 Carcinoma in situ of skin of other parts of face: Secondary | ICD-10-CM | POA: Diagnosis not present

## 2023-08-25 DIAGNOSIS — L570 Actinic keratosis: Secondary | ICD-10-CM | POA: Diagnosis not present

## 2023-08-25 DIAGNOSIS — C4492 Squamous cell carcinoma of skin, unspecified: Secondary | ICD-10-CM

## 2023-08-25 DIAGNOSIS — D485 Neoplasm of uncertain behavior of skin: Secondary | ICD-10-CM

## 2023-08-25 HISTORY — DX: Squamous cell carcinoma of skin, unspecified: C44.92

## 2023-08-25 NOTE — Patient Instructions (Signed)

## 2023-08-25 NOTE — Progress Notes (Signed)
 Follow-Up Visit   Subjective  Misty Adams is a 68 y.o. female who presents for the following: spot at right temple, noticed it about 2 months ago. Started out looking like a pimple but then developed a scab. It does get sore.   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   The patient has spots, moles and lesions to be evaluated, some may be new or changing and the patient may have concern these could be cancer.   The following portions of the chart were reviewed this encounter and updated as appropriate: medications, allergies, medical history  Review of Systems:  No other skin or systemic complaints except as noted in HPI or Assessment and Plan.  Objective  Well appearing patient in no apparent distress; mood and affect are within normal limits.   A focused examination was performed of the following areas: Face, back, chest  Relevant exam findings are noted in the Assessment and Plan.  Right Temple 9 mm pink keratotic papule  mid chest x 1 Erythematous thin papules/macules with gritty scale.   Assessment & Plan     NEOPLASM OF UNCERTAIN BEHAVIOR OF SKIN Right Temple Skin / nail biopsy Type of biopsy: tangential   Informed consent: discussed and consent obtained   Timeout: patient name, date of birth, surgical site, and procedure verified   Procedure prep:  Patient was prepped and draped in usual sterile fashion Prep type:  Isopropyl alcohol Anesthesia: the lesion was anesthetized in a standard fashion   Anesthetic:  1% lidocaine  w/ epinephrine 1-100,000 buffered w/ 8.4% NaHCO3 Instrument used: DermaBlade   Hemostasis achieved with: pressure and aluminum chloride   Outcome: patient tolerated procedure well   Post-procedure details: sterile dressing applied and wound care instructions given   Dressing type: bandage and petrolatum   Specimen 1 - Surgical pathology Differential Diagnosis: r/o SCC  Check Margins: No 9 mm pink  keratotic papule AK (ACTINIC KERATOSIS) mid chest x 1 Actinic keratoses are precancerous spots that appear secondary to cumulative UV radiation exposure/sun exposure over time. They are chronic with expected duration over 1 year. A portion of actinic keratoses will progress to squamous cell carcinoma of the skin. It is not possible to reliably predict which spots will progress to skin cancer and so treatment is recommended to prevent development of skin cancer.  Recommend daily broad spectrum sunscreen SPF 30+ to sun-exposed areas, reapply every 2 hours as needed.  Recommend staying in the shade or wearing long sleeves, sun glasses (UVA+UVB protection) and wide brim hats (4-inch brim around the entire circumference of the hat). Call for new or changing lesions. Destruction of lesion - mid chest x 1 Complexity: simple   Destruction method: cryotherapy   Informed consent: discussed and consent obtained   Timeout:  patient name, date of birth, surgical site, and procedure verified Lesion destroyed using liquid nitrogen: Yes   Region frozen until ice ball extended beyond lesion: Yes   Cryo cycles: 1 or 2. Outcome: patient tolerated procedure well with no complications   Post-procedure details: wound care instructions given   SEBORRHEIC KERATOSES    SEBORRHEIC KERATOSIS - Stuck-on, waxy, tan-brown papules and/or plaques on back - Benign-appearing - Discussed benign etiology and prognosis. - Observe - Call for any changes   Return if symptoms worsen or fail to improve.  Kerstin Peeling, RMA, am acting as scribe for Harris Liming, MD .   Documentation: I have reviewed the above documentation  for accuracy and completeness, and I agree with the above.  Harris Liming, MD

## 2023-08-26 ENCOUNTER — Telehealth: Payer: Self-pay | Admitting: Family

## 2023-08-26 NOTE — Progress Notes (Unsigned)
 Advanced Heart Failure Clinic Note    PCP: Sari Cunning, MD (last seen 03/25) Cardiologist: Antionette Kirks, MD/ Morey Ar, NP (last seen 03/25)  Chief Complaint: fatigue  HPI:  Misty Adams is a 68 y/o female with a history of COPD, HTN, hyperlipidemia, mild pHTN, GERD, subclavian artery stenosis (stented 07/2017), CKD, pulmonary HTN, CAD, iron  deficiency anemia, hypothyroidism, PVD, tobacco use and chronic heart failure.   Admitted 07/14/22 with E. coli UTI, severe sepsis, and AKI. High-sensitivity troponin rose to 56. She did not undergo cardiology evaluation. In August 2024 CT chest performed secondary to weight loss showed aortic atherosclerosis, unchanged 4 mm right apical pulmonary nodule, enlarged pulmonary trunk consistent with PAH, left adrenal adenoma.   Admitted 03/08/23 due to waking up short of breath & orthopnea after returning from a cruise. BNP 710. CXR showed heart failure. Initially placed on bipap and weaned down to nasal cannula. IV lasix  given. Hypokalemia corrected. Echocardiogram performed on 03/08/2023 showed ejection fraction 60 to 65% with grade 2 diastolic dysfunction. Symptoms improved.   Admitted 03/23/23 due to worsening of shortness of breath, leg swelling and significant weight gain. Developed hypoxia and placed on 5L nasal cannula. BNP elevated and given IV lasix . Cardiology consulted. Chest x-ray showed evidence of pulmonary edema with pleural effusion. CT shows signs consistent with pulmonary hypertension. Oxygen  weaned down to 2L. L and R heart cath 12/5 with stent placed to RCA. Hypokalemia corrected.   RHC/ LHC 03/26/23:   Prox RCA lesion is 45% stenosed.   CULPRIT LESION: Mid RCA lesion is 90% stenosed.   A drug-eluting stent was successfully placed using a STENT ONYX FRONTIER 3.0X22.   Post intervention, there is a 0% residual stenosis.   -------------------------------------   Hemodynamic findings consistent with mild pulmonary  hypertension.  (PAP 49/24-33 mmHg, PCWP 24 mmHg transpulmonary Gradient 9 mmHg); LVEDP 16 mmHg.   Compensated CHF: LVEDP 16 mmHg, PCWP 24 mmHg.   Recommend uninterrupted dual antiplatelet therapy with Aspirin  81mg  daily and Clopidogrel  75mg  daily for a minimum of 12 months (ACS-Class I recommendation).  Admitted 07/06/23 with lightheadedness and multiple presyncopal episodes in preceding 24 hours leading to a fall on her lower back. Hypotensive in ED 95/45. RBC transfusion given for hemoglobin of 6.6. Was also given IV iron  infusion. No injuries noted on imaging in the ED. Given IVF for hypotension. GI consulted and will have outpatient follow-up.   Seen in [place] 04/25 and spironolactone  12.5mg  daily was started.   She presents for a HF follow-up visit with a chief complaint of fatigue. Has associated weakness, shortness of breath, minimal abdominal distention & bloating along with this. Denies chest pain, palpitations, pedal edema or dizziness. Headed to the beach next weekend.   Continues to smoke 1.5 ppd. Did not take her medications yet today.   ROS: All systems negative except as listed in HPI, PMH and Problem List.  SH:  Social History   Socioeconomic History   Marital status: Divorced    Spouse name: Not on file   Number of children: 5   Years of education: Not on file   Highest education level: Associate degree: academic program  Occupational History   Occupation: Retired  Tobacco Use   Smoking status: Every Day    Current packs/day: 2.00    Average packs/day: 2.0 packs/day for 50.0 years (100.0 ttl pk-yrs)    Types: Cigarettes   Smokeless tobacco: Never  Vaping Use   Vaping status: Former   Substances: Nicotine  Substance and Sexual Activity   Alcohol use: Yes   Drug use: Not Currently   Sexual activity: Not Currently  Other Topics Concern   Not on file  Social History Narrative   Lives w/ 3 of her children   Social Drivers of Health   Financial Resource Strain:  High Risk (05/07/2023)   Received from [facility]   Overall Financial Resource Strain (CARDIA)    Difficulty of Paying Living Expenses: Hard  Food Insecurity: No Food Insecurity (07/07/2023)   Hunger Vital Sign    Worried About Running Out of Food in the Last Year: Never true    Ran Out of Food in the Last Year: Never true  Recent Concern: Food Insecurity - Food Insecurity Present (05/07/2023)   Received from [facility]   Hunger Vital Sign    Worried About Running Out of Food in the Last Year: Sometimes true    Ran Out of Food in the Last Year: Sometimes true  Transportation Needs: No Transportation Needs (07/07/2023)   PRAPARE - Administrator, Civil Service (Medical): No    Lack of Transportation (Non-Medical): No  Physical Activity: Not on file  Stress: Not on file  Social Connections: Moderately Isolated (07/07/2023)   Social Connection and Isolation Panel [NHANES]    Frequency of Communication with Friends and Family: More than three times a week    Frequency of Social Gatherings with Friends and Family: More than three times a week    Attends Religious Services: Never    Database administrator or Organizations: No    Attends Engineer, structural: 1 to 4 times per year    Marital Status: Divorced  Catering manager Violence: Not At Risk (07/07/2023)   Humiliation, Afraid, Rape, and Kick questionnaire    Fear of Current or Ex-Partner: No    Emotionally Abused: No    Physically Abused: No    Sexually Abused: No    FH:  Family History  Problem Relation Age of Onset   Uterine cancer Mother    Stroke Mother    Aneurysm Mother    Heart attack Father    Coronary artery disease Father    Leukemia Father    Depression Father    Diabetes Father    Hyperlipidemia Father    Hypertension Father    Colon polyps Sister    Obesity Sister    Thyroid  disease Sister    COPD Sister    Lung cancer Sister    Breast cancer Maternal  Aunt    Breast cancer Maternal Aunt     Past Medical History:  Diagnosis Date   Allergy    Anxiety    Chronic heart failure with preserved ejection fraction (HFpEF) (HCC)    a. 02/2023 Echo: EF 60-65%, no rwma, GrII DD, nl RV size/fxn, mild LAE, mild Misty (mean grad ), mod MAC, mild AS (mean grad 9.24mmHg).   CKD (chronic kidney disease), stage III (HCC)    COPD (chronic obstructive pulmonary disease) (HCC)    Depression    Diabetes mellitus without complication (HCC)    Hyperlipidemia    Hypertension    Hypothyroidism    Interstitial lung disease (HCC)    PAD (peripheral artery disease) (HCC)    a. 07/2017 L subclavian stenosis s/p stenting.   Psoriasis    Substance abuse (HCC)    Tobacco abuse     Current Outpatient Medications  Medication Sig Dispense Refill  ACCU-CHEK GUIDE TEST test strip      Accu-Chek Softclix Lancets lancets SMARTSIG:Lancet Topical     acetaminophen  (TYLENOL ) 500 MG tablet Take 1,000 mg by mouth every 6 (six) hours as needed for moderate pain or headache.     albuterol  (VENTOLIN  HFA) 108 (90 Base) MCG/ACT inhaler Inhale 2 puffs into the lungs every 6 (six) hours as needed for wheezing or shortness of breath. 1 each 3   ALPRAZolam  (XANAX ) 0.5 MG tablet Take 0.5 mg by mouth 2 (two) times daily as needed for anxiety.      aspirin  81 MG tablet Take 81 mg by mouth at bedtime.      atorvastatin  (LIPITOR) 40 MG tablet Take 1 tablet (40 mg total) by mouth at bedtime. 30 tablet 0   bisoprolol  (ZEBETA ) 5 MG tablet TAKE 1 TABLET (5 MG TOTAL) BY MOUTH DAILY. 30 tablet 5   clopidogrel  (PLAVIX ) 75 MG tablet Take 1 tablet (75 mg total) by mouth daily with breakfast. 90 tablet 3   empagliflozin  (JARDIANCE ) 10 MG TABS tablet Take 1 tablet (10 mg total) by mouth daily. 30 tablet 1   ezetimibe  (ZETIA ) 10 MG tablet TAKE 1 TABLET BY MOUTH EVERY DAY 30 tablet 5   gabapentin  (NEURONTIN ) 100 MG capsule Take 200 mg by mouth at bedtime.     guaiFENesin -dextromethorphan   (ROBITUSSIN DM) 100-10 MG/5ML syrup Take 10 mLs by mouth every 4 (four) hours as needed for cough. (Patient not taking: Reported on 07/29/2023) 118 mL 0   levothyroxine  (SYNTHROID , LEVOTHROID) 125 MCG tablet Take 125 mcg by mouth daily.      ondansetron  (ZOFRAN ) 4 MG tablet Take 4 mg by mouth every 4 (four) hours as needed for nausea or vomiting. (Patient not taking: Reported on 07/29/2023)     oxyCODONE -acetaminophen  (PERCOCET) 5-325 MG tablet Take 1 tablet by mouth every 4 (four) hours as needed for severe pain (pain score 7-10). (Patient not taking: Reported on 07/29/2023) 20 tablet 0   OXYGEN  Inhale 2 L/L into the lungs at bedtime.     pantoprazole  (PROTONIX ) 40 MG tablet Take 40 mg by mouth 2 (two) times daily.     potassium chloride  SA (KLOR-CON  M20) 20 MEQ tablet Take 40 mEq by mouth 2 (two) times daily.     spironolactone  (ALDACTONE ) 25 MG tablet TAKE 1 TABLET (25 MG TOTAL) BY MOUTH DAILY. (Patient taking differently: Take 12.5 mg by mouth daily.) 30 tablet 5   torsemide  (DEMADEX ) 20 MG tablet Take 2 tablets (40 mg total) by mouth daily. 60 tablet 5   traZODone  (DESYREL ) 100 MG tablet Take 100 mg by mouth daily as needed for sleep.     No current facility-administered medications for this visit.   Vitals:   08/27/23 0954  BP: 123/62  Pulse: (!) 56  SpO2: 98%  Weight: 167 lb 9.6 oz (76 kg)   Wt Readings from Last 3 Encounters:  08/27/23 167 lb 9.6 oz (76 kg)  07/29/23 168 lb 3.2 oz (76.3 kg)  07/28/23 168 lb (76.2 kg)   Lab Results  Component Value Date   CREATININE 1.56 (H) 07/29/2023   CREATININE 1.59 (H) 07/28/2023   CREATININE 1.64 (H) 07/21/2023    PHYSICAL EXAM:  General: Well appearing. No resp difficulty HEENT: normal Neck: supple, no JVD Cor: Regular rhythm, bradycardic. No rubs, gallops or murmurs Lungs: clear Abdomen: soft, nontender, nondistended. Extremities: no cyanosis, clubbing, rash, edema Neuro: alert & oriented X 3. Moves all 4 extremities w/o difficulty.  Affect pleasant  ECG: not done    ASSESSMENT & PLAN:  1: Ischemic heart failure with preserved ejection fraction- - cath with stent 03/26/23 - NYHA class II - weighing daily; reminded to call for overnight weight gain > 2 pounds or a weekly weight gain  >5 pounds - weight stable from last visit here 1 month ago - Echo 03/09/23: EF 60-65% with Grade II DD, mild LAE, mild AS - continue bisoprolol  5mg  daily - continue jardiance  10mg  daily  - continue potassium 40meq BID - continue spironolactone  12.5mg  daily - continue torsemide  40mg  daily - BMET today - not adding salt and has been looking at food labels - BNP 06/29/23 was 192.3  2: HTN- - BP 123/62 - saw PCP Annabell Key) 03/25 - BMP 07/29/23 reviewed: sodium 136, potassium 3.7, creatinine 1.56, GFR 36 - BMET today  3: CAD- - saw cardiology ([place]) 03/25 - continue clopidogrel  75mg  daily - continue ASA 81mg  daily - RHC/ LHC 03/26/23:   Prox RCA lesion is 45% stenosed.   CULPRIT LESION: Mid RCA lesion is 90% stenosed.   A drug-eluting stent was successfully placed using a STENT ONYX FRONTIER 3.0X22.   Post intervention, there is a 0% residual stenosis.   Hemodynamic findings consistent with mild pulmonary hypertension.  (PAP 49/24-33 mmHg, PCWP 24 mmHg transpulmonary Gradient 9 mmHg); LVEDP 16 mmHg.  4: COPD (managed by pulmonology)- - continue albuterol  inhaler PRN; she estimates that she uses this ~ once / month - continue breztri  - saw pulmonology Jamal Mays) 04/25 - wearing oxygen  at 2L at bedtime only  5: Anemia (managed by hematology)- - continue ferrous sulfate  325mg  daily - saw hematology Valentine Gasmen) 04/25 - iron  infusion done 08/25/23 - Hg 07/29/23 was 8.8  6: Hyperlipidemia- - continue atorvastatin  40mg  daily - continue ezetimibe  10mg  daily - LDL 05/05/23 was 37 - lipo (a) 03/27/23 was 54.2  7: Tobacco use- - smoking 1.5 ppd of cigarettes - states that she is considering stopping smoking because of her  health - cessation encouraged   Return in 6 months, sooner if needed  Shawnee Dellen, [place] 08/26/23

## 2023-08-26 NOTE — Telephone Encounter (Signed)
 Called to confirm/remind patient of their appointment at the Advanced [facility] Failure Clinic on 08/27/23.   Appointment:   [x] Confirmed  [] Left mess   [] No answer/No voice mail  [] VM Full/unable to leave message  [] Phone not in service  Patient reminded to bring all medications and/or complete list.  Confirmed patient has transportation. Gave directions, instructed to utilize valet parking.

## 2023-08-27 ENCOUNTER — Encounter: Payer: Self-pay | Admitting: Family

## 2023-08-27 ENCOUNTER — Ambulatory Visit: Attending: Family | Admitting: Family

## 2023-08-27 VITALS — BP 123/62 | HR 56 | Wt 167.6 lb

## 2023-08-27 DIAGNOSIS — I739 Peripheral vascular disease, unspecified: Secondary | ICD-10-CM | POA: Diagnosis not present

## 2023-08-27 DIAGNOSIS — Z9981 Dependence on supplemental oxygen: Secondary | ICD-10-CM | POA: Insufficient documentation

## 2023-08-27 DIAGNOSIS — D649 Anemia, unspecified: Secondary | ICD-10-CM

## 2023-08-27 DIAGNOSIS — Z7902 Long term (current) use of antithrombotics/antiplatelets: Secondary | ICD-10-CM | POA: Diagnosis not present

## 2023-08-27 DIAGNOSIS — D509 Iron deficiency anemia, unspecified: Secondary | ICD-10-CM | POA: Diagnosis not present

## 2023-08-27 DIAGNOSIS — Z955 Presence of coronary angioplasty implant and graft: Secondary | ICD-10-CM | POA: Insufficient documentation

## 2023-08-27 DIAGNOSIS — Z7984 Long term (current) use of oral hypoglycemic drugs: Secondary | ICD-10-CM | POA: Diagnosis not present

## 2023-08-27 DIAGNOSIS — N183 Chronic kidney disease, stage 3 unspecified: Secondary | ICD-10-CM | POA: Diagnosis not present

## 2023-08-27 DIAGNOSIS — E1122 Type 2 diabetes mellitus with diabetic chronic kidney disease: Secondary | ICD-10-CM | POA: Diagnosis not present

## 2023-08-27 DIAGNOSIS — Z79899 Other long term (current) drug therapy: Secondary | ICD-10-CM | POA: Insufficient documentation

## 2023-08-27 DIAGNOSIS — E782 Mixed hyperlipidemia: Secondary | ICD-10-CM

## 2023-08-27 DIAGNOSIS — I2510 Atherosclerotic heart disease of native coronary artery without angina pectoris: Secondary | ICD-10-CM | POA: Insufficient documentation

## 2023-08-27 DIAGNOSIS — J449 Chronic obstructive pulmonary disease, unspecified: Secondary | ICD-10-CM | POA: Insufficient documentation

## 2023-08-27 DIAGNOSIS — R140 Abdominal distension (gaseous): Secondary | ICD-10-CM | POA: Insufficient documentation

## 2023-08-27 DIAGNOSIS — I130 Hypertensive heart and chronic kidney disease with heart failure and stage 1 through stage 4 chronic kidney disease, or unspecified chronic kidney disease: Secondary | ICD-10-CM | POA: Diagnosis not present

## 2023-08-27 DIAGNOSIS — E039 Hypothyroidism, unspecified: Secondary | ICD-10-CM | POA: Diagnosis not present

## 2023-08-27 DIAGNOSIS — Z5986 Financial insecurity: Secondary | ICD-10-CM | POA: Insufficient documentation

## 2023-08-27 DIAGNOSIS — I5032 Chronic diastolic (congestive) heart failure: Secondary | ICD-10-CM | POA: Insufficient documentation

## 2023-08-27 DIAGNOSIS — R5383 Other fatigue: Secondary | ICD-10-CM | POA: Insufficient documentation

## 2023-08-27 DIAGNOSIS — E785 Hyperlipidemia, unspecified: Secondary | ICD-10-CM | POA: Insufficient documentation

## 2023-08-27 DIAGNOSIS — I272 Pulmonary hypertension, unspecified: Secondary | ICD-10-CM | POA: Diagnosis not present

## 2023-08-27 DIAGNOSIS — Z79890 Hormone replacement therapy (postmenopausal): Secondary | ICD-10-CM | POA: Diagnosis not present

## 2023-08-27 DIAGNOSIS — E1151 Type 2 diabetes mellitus with diabetic peripheral angiopathy without gangrene: Secondary | ICD-10-CM | POA: Insufficient documentation

## 2023-08-27 DIAGNOSIS — Z720 Tobacco use: Secondary | ICD-10-CM

## 2023-08-27 DIAGNOSIS — Z87440 Personal history of urinary (tract) infections: Secondary | ICD-10-CM | POA: Insufficient documentation

## 2023-08-27 DIAGNOSIS — I10 Essential (primary) hypertension: Secondary | ICD-10-CM | POA: Diagnosis not present

## 2023-08-27 DIAGNOSIS — Z7982 Long term (current) use of aspirin: Secondary | ICD-10-CM | POA: Insufficient documentation

## 2023-08-27 DIAGNOSIS — F17210 Nicotine dependence, cigarettes, uncomplicated: Secondary | ICD-10-CM | POA: Diagnosis not present

## 2023-08-27 NOTE — Progress Notes (Addendum)
 [facility] - HEART FAILURE CLINIC - PHARMACIST COUNSELING NOTE  Misty Adams is a 68 y.o. year old female who presents to the HF clinic for her one month follow-up appointment. They have a past medical history significant for COPD, HTN, hyperlipidemia, mild pHTN, GERD, subclavian artery stenosis (stented 07/2017), CKD, pulmonary HTN, CAD, T2DM,  iron  deficiency anemia, hypothyroidism, PVD, tobacco use and chronic heart failure. Initially, they were diagnosed with HF on 03/08/2023 secondary to unknown etiology with an HFpEF of 60-65%. At their last appointment were started on spironolactone  at a lower dose. They reported they previously were not on this agent due to it previously dropping their blood pressure. They were agreeable to start it again. They had a Red score of 33%.    Current Guideline-Directed Medical Therapy (GDMT)  ACE/ARB/ARNI: N/A Beta Blocker: Bisoprolol  5 mg daily  Aldosterone Antagonist: Spironolactone  12.5 mg daily Diuretic: Torsemide  40 mg daily -- Potassium 40 mg mEq BID  SGLT2i: Empagliflozin  10 mg daily  Vital Signs and Trends  Recent Trends BP Readings from Last 3 Encounters:  08/19/23 (!) 110/33  08/12/23 117/61  08/05/23 (!) 130/53   Wt Readings from Last 3 Encounters:  07/29/23 168 lb 3.2 oz (76.3 kg)  07/28/23 168 lb (76.2 kg)  07/16/23 167 lb (75.8 kg)   Today's visit HR 56 BP 123/62 Weight (pounds) 167.6 lbs  Do you check your weight daily? [x] Yes [] No  Do you check you blood pressure daily [] Yes [x] No  PTA weight: Stable 165 lbs  PTA PTA: 110 - 120s / 50s - 40s   Cardiac Imaging  Echo 03/09/23: EF 60-65% with Grade II DD, mild LAE, mild AS   Changes Since Last Visit   Hospitalizations/ED visits (last 6 months):  -- Date: 07/06/23 - 07/08/23, CC: loss of consciousness, Admission Dx: anemia & hypokalemia  Medication changes: NA  Pertinent Labs  Lab Results  Component Value Date   NA 136 07/29/2023   CL 96 (L)  07/29/2023   K 3.7 07/29/2023   CO2 29 07/29/2023   BUN 25 (H) 07/29/2023   CREATININE 1.56 (H) 07/29/2023   GFRNONAA 36 (L) 07/29/2023   CALCIUM  8.7 (L) 07/29/2023   ALBUMIN  3.5 07/06/2023   GLUCOSE 123 (H) 07/29/2023   No results found for: "LDLCALC" Lab Results  Component Value Date   HGBA1C 7.3 (H) 07/15/2022    ASSESSMENT  Reason for today's visit   To assess current guideline directed medical therapy. This is a routine follow-up after starting spironolactone  and also patient is struggling with volume control.    Guideline-Directed Medical Therapy Evaluation  ACEi/ARB/ARNi Patient is not on therapy because due to low BP, could be considered in the future if BP increase  Beta-Blocker  Target Achieved [] Yes [x] No Up-titration candidate today [] Yes [x] No  MRA Target Achieved [] Yes [x] No Up-titration candidate today [] Yes [x] No  SGLT-2 Target Achieved [x] Yes [] No Up-titration candidate today [] Yes [x] No  Loop  Increase dose today  [] Yes [x] No  Heart Failure Symptoms & Volume Status   Dyspnea on exertion, most than usual [x] Yes [] No  Fatigue, COPD and iron  deficiency (correcting) [x] Yes [] No  Lower extremity edema [] Yes [x] No  Weight gain (> 5 lbs) [] Yes [x] No  Cough or wheezing, lots of coughing in throat  [x] Yes [] No  Abdominal bloating/Discomfort, they feel the fluid and are bloated  [x] Yes [] No  Early satiety or poor appetite, appetite ok  [] Yes [x] No  Dizziness of lightheadedness  [] Yes [x] No   Adherence Assessment  Do you ever forget to take your medication? [] Yes [x] No  Do you ever skip doses due to side effects? [] Yes [x] No  Do you have trouble affording your medicines? [] Yes [x] No  Are you ever unable to pick up your medication due to transportation difficulties? [] Yes [x] No  Do you ever stop taking your medications because you don't believe they are helping? [] Yes [x] No   Adherence strategy: Keep everything in the bottle, have a bag  that they separate AM and PM pills   Barriers to obtaining medications: Manufacturer's assistance program  Did you take your medications before your appointment today: [] Yes [x] No  Preventative Care   Parameter Most Recent Result Goal/Status Current Medications  LDL 05/05/23,  37  < 70 - 55 mg/dL Controlled  Statin: Atorvastatin  40 mg daily Zetia  10 mg daily   A1c 7.3  7 - 8 % Not controlled NA  BP Control 123/62 < 130/80 Controlled  NA  ECHO 60-65% 6 - 12 months  Last 02/2023   PLAN  Medication Interventions:  -- Patient's blood pressure likely wouldn't tolerate increasing spironolactone  at this time   -- Continue current regimen per NP  Preventative Care Follow-up:  -- Unclear if patient's diabetes is being treated  Lab or imaging orders:  -- BMET today (last labs 07/29/2023)  Time spent: 15 minutes  Thank you for allowing pharmacy to participate in this patient's care.   Lamica Mccart K Klea Nall, Pharm.D. Pharmacy Resident 08/27/2023 7:37 AM

## 2023-08-27 NOTE — Patient Instructions (Signed)
 Medication Changes:  No medication changes today!  Lab Work:  Go DOWN to LOWER LEVEL (LL) to have your blood work completed inside of [employer] office.  We will only call you if the results are abnormal or if the provider would like to make medication changes.    Follow-Up in: Please follow up with the Advanced Heart Failure Clinic in 6 months with Shawnee Dellen, FNP.  At the Advanced Heart Failure Clinic, you and your health needs are our priority. We have a designated team specialized in the treatment of Heart Failure. This Care Team includes your primary Heart Failure Specialized Cardiologist (physician), Advanced Practice Providers (APPs- Physician Assistants and Nurse Practitioners), and Pharmacist who all work together to provide you with the care you need, when you need it.   You may see any of the following providers on your designated Care Team at your next follow up:  Dr. Jules Oar Dr. Peder Bourdon Dr. Alwin Baars Dr. Judyth Nunnery Shawnee Dellen, FNP Bevely Brush, RPH-CPP  Please be sure to bring in all your medications bottles to every appointment.   Need to Contact Us :  If you have any questions or concerns before your next appointment please send us  a message through [place] or call our office at [phone].    TO LEAVE A MESSAGE FOR THE NURSE SELECT OPTION 2, PLEASE LEAVE A MESSAGE INCLUDING: YOUR NAME DATE OF BIRTH CALL BACK NUMBER REASON FOR CALL**this is important as we prioritize the call backs  YOU WILL RECEIVE A CALL BACK THE SAME DAY AS LONG AS YOU CALL BEFORE 4:00 PM

## 2023-08-28 ENCOUNTER — Encounter: Payer: Self-pay | Admitting: Family

## 2023-08-28 LAB — BASIC METABOLIC PANEL WITH GFR
BUN/Creatinine Ratio: 21 (ref 12–28)
BUN: 34 mg/dL — ABNORMAL HIGH (ref 8–27)
CO2: 26 mmol/L (ref 20–29)
Calcium: 9.7 mg/dL (ref 8.7–10.3)
Chloride: 98 mmol/L (ref 96–106)
Creatinine, Ser: 1.62 mg/dL — ABNORMAL HIGH (ref 0.57–1.00)
Glucose: 91 mg/dL (ref 70–99)
Potassium: 4.5 mmol/L (ref 3.5–5.2)
Sodium: 141 mmol/L (ref 134–144)
eGFR: 34 mL/min/{1.73_m2} — ABNORMAL LOW (ref >59)

## 2023-08-28 LAB — SURGICAL PATHOLOGY

## 2023-08-31 ENCOUNTER — Telehealth: Payer: Self-pay

## 2023-08-31 DIAGNOSIS — D099 Carcinoma in situ, unspecified: Secondary | ICD-10-CM

## 2023-08-31 NOTE — Telephone Encounter (Signed)
-----   Message from [place] sent at 08/29/2023 10:18 AM EDT ----- Diagnosis right temple :       SQUAMOUS CELL CARCINOMA IN SITU, HYPERTROPHIC, BASE INVOLVED    Please call with diagnosis and message me with patient's decision on treatment.   Explanation: Biopsy shows a squamous cell skin cancer limited to the top layer of skin. This means it is an early cancer and has not spread. However, it has the potential to spread beyond the skin and threaten your health, so we recommend treating it.   Treatment option 1: a cream (fluorouracil and calcipotriene) that helps your immune system clear the skin cancer. It will cause redness and irritation. Wait two weeks after the biopsy to start applying the cream. Apply the cream twice per day until the redness and irritation develop (usually occurs by day 7), then stop and allow it to heal. We will recheck the area in 2 months to ensure the cancer is gone. The cream is $45 plus shipping and will be mailed to you from a low cost compounding pharmacy.  Treatment option 2: Mohs surgery, which involves cutting out right around the skin cancer and then checking under the microscope on the same day to ensure the whole skin cancer is out. If there is more cancer remaining, the surgeon will repeat the process until it is fully cleared. The cure rate is about 98-99%. It is done at another office outside of [place] ([place], [place], or [place]). Once the Mohs surgeon confirms the skin cancer is out, they will discuss the options to repair or heal the area. You must take it easy for about two weeks after surgery (no lifting over 10-15 lbs, avoid activity to get your heart rate and blood pressure up).

## 2023-08-31 NOTE — Telephone Encounter (Signed)
 Advised pt of bx results.  Discussed treatment options.  Patient prefers mohs in [place] with Dr. Paci.  Advised referral sent and Dr. Shawn Delay office will call her and schedule appointment.

## 2023-09-07 ENCOUNTER — Other Ambulatory Visit: Payer: Self-pay

## 2023-09-09 ENCOUNTER — Other Ambulatory Visit: Payer: Self-pay | Admitting: Specialist

## 2023-09-09 DIAGNOSIS — R918 Other nonspecific abnormal finding of lung field: Secondary | ICD-10-CM

## 2023-09-10 ENCOUNTER — Encounter: Payer: Self-pay | Admitting: Dermatology

## 2023-09-16 ENCOUNTER — Encounter: Admitting: Dermatology

## 2023-09-22 ENCOUNTER — Ambulatory Visit: Admitting: Dermatology

## 2023-09-22 ENCOUNTER — Encounter: Payer: Self-pay | Admitting: Dermatology

## 2023-09-22 VITALS — BP 125/58 | HR 66 | Temp 98.0°F

## 2023-09-22 DIAGNOSIS — C4492 Squamous cell carcinoma of skin, unspecified: Secondary | ICD-10-CM

## 2023-09-22 DIAGNOSIS — D0439 Carcinoma in situ of skin of other parts of face: Secondary | ICD-10-CM

## 2023-09-22 DIAGNOSIS — L814 Other melanin hyperpigmentation: Secondary | ICD-10-CM

## 2023-09-22 DIAGNOSIS — L579 Skin changes due to chronic exposure to nonionizing radiation, unspecified: Secondary | ICD-10-CM | POA: Diagnosis not present

## 2023-09-22 NOTE — Progress Notes (Signed)
 Follow-Up Visit   Subjective  Misty Adams is a 68 y.o. 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 Felipe Horton.  The following portions of the chart were reviewed this encounter and updated as appropriate: medications, allergies, medical history  Review of Systems:  No other skin or systemic complaints except as noted in HPI or Assessment and Plan.  Objective  Well appearing patient in no apparent distress; mood and affect are within normal limits.  A focused examination was performed of the following areas: Right temple Relevant physical exam findings are noted in the Assessment and Plan.   Right Temple Healing biopsy site   Assessment & Plan   SQUAMOUS CELL CARCINOMA OF SKIN Right Temple Mohs surgery  Consent obtained: written  Anticoagulation: Is the patient taking prescription anticoagulant and/or aspirin  prescribed/recommended by a physician? Yes   Was the anticoagulation regimen changed prior to Mohs? No    Procedure Details: Timeout: pre-procedure verification complete Procedure Prep: patient was prepped and draped in usual sterile fashion Prep type: chlorhexidine Biopsy accession number: [id number] Biopsy lab: [facility] Frozen section biopsy performed: No   Specimen debulked: No   Pre-Op diagnosis: squamous cell carcinoma SCC subtype: in situ MohsAIQ Surgical site (if tumor spans multiple areas, please select predominant area): temple Surgery side: right Surgical site (from skin exam): Right Temple Pre-operative length (cm): 0.6 Pre-operative width (cm): 0.7 Indications for Mohs surgery: anatomic location where tissue conservation is critical Previously treated? No    Micrographic Surgery Details: Post-operative length (cm): 1.1 Post-operative width (cm): 1.1 Number of Mohs stages: 1 Is this a complex case (associate members only): No    Patient tolerance of  procedure: tolerated well, no immediate complications  Reconstruction: Was the defect reconstructed? Yes   Was reconstruction performed by the same Mohs surgeon? Yes   Setting of reconstruction: outpatient office When was reconstruction performed? same day Type of reconstruction: linear Linear reconstruction: simple  Opioids: Did the patient receive a prescription for opioid/narcotic related to Mohs surgery? Yes   Indications for opioid/narcotics: patient required additional pain relief despite trial of non-opioid analgesia  Antibiotics: Does patient meet AHA guidelines for endocarditis?: No   Does patient meet AHA guidelines for orthopedic prophylaxis?: No   Were antibiotics given on the day of surgery?: No   Did surgery breach mucosa, expose cartilage/bone, involve an area of lymphedema/inflamed/infected tissue? No    Skin repair Complexity:  Complex Final length (cm):  3 Informed consent: discussed and consent obtained   Timeout: patient name, date of birth, surgical site, and procedure verified   Procedure prep:  Patient was prepped and draped in usual sterile fashion Prep type:  Chlorhexidine Anesthesia: the lesion was anesthetized in a standard fashion   Anesthetic:  1% lidocaine  w/ epinephrine 1-100,000 buffered w/ 8.4% NaHCO3 Reason for type of repair: reduce tension to allow closure and preserve normal anatomy   Undermining: area extensively undermined   Subcutaneous layers (deep stitches):  Suture size:  5-0 Suture type: Monocryl (poliglecaprone 25)   Stitches:  Buried vertical mattress Fine/surface layer approximation (top stitches):  Suture size:  6-0 Suture type: fast-absorbing plain gut   Stitches: simple running   Hemostasis achieved with: suture, pressure and electrodesiccation Outcome: patient tolerated procedure well with no complications   Post-procedure details: sterile dressing applied and wound care instructions given   Dressing type: bandage,  petrolatum and pressure dressing     Return  in about 4 weeks (around 10/20/2023) for wound check.  Tonnie Frederick, Surg Tech III, am acting as scribe for Deneise Finlay, MD.    09/22/2023  HISTORY OF PRESENT ILLNESS  Misty Adams is seen in consultation at the request of Dr. Felipe Horton for biopsy-proven Hypertrophic Squamous Cell Carcinoma in Situ on the right temple. They note that the area has been present for about 6 months increasing in size with time.  There is no history of previous treatment.  Reports no other new or changing lesions and has no other complaints today.  Medications and allergies: see patient chart.  Review of systems: Reviewed 8 systems and notable for the above skin cancer.  All other systems reviewed are unremarkable/negative, unless noted in the HPI. Past medical history, surgical history, family history, social history were also reviewed and are noted in the chart/questionnaire.    PHYSICAL EXAMINATION  General: Well-appearing, in no acute distress, alert and oriented x 4. Vitals reviewed in chart (if available).   Skin: Exam reveals a 0.6 x 0.7 cm erythematous papule and biopsy scar on the right temple. There are rhytids, telangiectasias, and lentigines, consistent with photodamage.  Biopsy report(s) reviewed, confirming the diagnosis.  ASSESSMENT  1) Squamous Cell Carcinoma in Situ of the 2) photodamage 3) solar lentigines   PLAN   1. Due to location, size, histology, or recurrence and the likelihood of subclinical extension as well as the need to conserve normal surrounding tissue, the patient was deemed acceptable for Mohs micrographic surgery (MMS).  The nature and purpose of the procedure, associated benefits and risks including recurrence and scarring, possible complications such as pain, infection, and bleeding, and alternative methods of treatment if appropriate were discussed with the patient during consent. The lesion location was verified by the  patient, by reviewing previous notes, pathology reports, and by photographs as well as angulation measurements if available.  Informed consent was reviewed and signed by the patient, and timeout was performed at 9:00 AM. See op note below.  2. For the photodamage and solar lentigines, sun protection discussed/information given on OTC sunscreens, and we recommend continued regular follow-up with primary dermatologist every 6 months or sooner for any growing, bleeding, or changing lesions. 3. Prognosis and future surveillance discussed. 4. Letter with treatment outcome sent to referring provider. 5. Pain acetaminophen /ibuprofen  MOHS MICROGRAPHIC SURGERY AND RECONSTRUCTION  Initial size:   0.6 x 0.7 cm Surgical defect/wound size: 1.1 x 1.1 cm Anesthesia:    0.33% lidocaine  with 1:200,000 epinephrine EBL:    <5 mL Complications:  None Repair type:   Complex SQ suture:   5-0 Monocryl Cutaneous suture:  6-0 Plain gut Final size of the repair: 3.0 cm  Stages: 1  STAGE I: Anesthesia achieved with 0.5% lidocaine  with 1:200,000 epinephrine. ChloraPrep applied. 1 section(s) excised using Mohs technique (this includes total peripheral and deep tissue margin excision and evaluation with frozen sections, excised and interpreted by the same physician). The tumor was first debulked and then excised with an approx. 2mm margin.  Hemostasis was achieved with electrocautery as needed.  The specimen was then oriented, subdivided/relaxed, inked, and processed using Mohs technique.    Frozen section analysis revealed a clear deep and peripheral margin.  Reconstruction  The surgical wound was then cleaned, prepped, and re-anesthetized as above. Wound edges were undermined extensively along at least one entire edge and at a distance equal to or greater than the width of the defect (see wound defect size above) in  order to achieve closure and decrease wound tension and anatomic distortion. Redundant tissue repair  including standing cone removal was performed. Hemostasis was achieved with electrocautery. Subcutaneous and epidermal tissues were approximated with the above sutures. The surgical site was then lightly scrubbed with sterile, saline-soaked gauze. The area was then bandaged using Vaseline ointment, non-adherent gauze, gauze pads, and tape to provide an adequate pressure dressing. The patient tolerated the procedure well, was given detailed written and verbal wound care instructions, and was discharged in good condition.   The patient will follow-up: 4 weeks.   Documentation: I have reviewed the above documentation for accuracy and completeness, and I agree with the above.  Deneise Finlay, MD

## 2023-09-22 NOTE — Patient Instructions (Signed)

## 2023-09-23 ENCOUNTER — Encounter: Payer: Self-pay | Admitting: Dermatology

## 2023-09-28 ENCOUNTER — Inpatient Hospital Stay: Attending: Internal Medicine

## 2023-09-28 ENCOUNTER — Encounter: Payer: Self-pay | Admitting: Internal Medicine

## 2023-09-28 ENCOUNTER — Inpatient Hospital Stay

## 2023-09-28 ENCOUNTER — Inpatient Hospital Stay: Admitting: Internal Medicine

## 2023-09-28 DIAGNOSIS — D509 Iron deficiency anemia, unspecified: Secondary | ICD-10-CM | POA: Insufficient documentation

## 2023-09-28 DIAGNOSIS — D649 Anemia, unspecified: Secondary | ICD-10-CM | POA: Diagnosis not present

## 2023-09-28 LAB — BASIC METABOLIC PANEL - CANCER CENTER ONLY
Anion gap: 10 (ref 5–15)
BUN: 29 mg/dL — ABNORMAL HIGH (ref 8–23)
CO2: 32 mmol/L (ref 22–32)
Calcium: 8.4 mg/dL — ABNORMAL LOW (ref 8.9–10.3)
Chloride: 96 mmol/L — ABNORMAL LOW (ref 98–111)
Creatinine: 1.74 mg/dL — ABNORMAL HIGH (ref 0.44–1.00)
GFR, Estimated: 31 mL/min — ABNORMAL LOW (ref >60)
Glucose, Bld: 81 mg/dL (ref 70–99)
Potassium: 3.7 mmol/L (ref 3.5–5.1)
Sodium: 138 mmol/L (ref 135–145)

## 2023-09-28 LAB — CBC WITH DIFFERENTIAL (CANCER CENTER ONLY)
Abs Immature Granulocytes: 0.07 10*3/uL (ref 0.00–0.07)
Basophils Absolute: 0.1 10*3/uL (ref 0.0–0.1)
Basophils Relative: 1 %
Eosinophils Absolute: 0.3 10*3/uL (ref 0.0–0.5)
Eosinophils Relative: 3 %
HCT: 33.3 % — ABNORMAL LOW (ref 36.0–46.0)
Hemoglobin: 9.5 g/dL — ABNORMAL LOW (ref 12.0–15.0)
Immature Granulocytes: 1 %
Lymphocytes Relative: 20 %
Lymphs Abs: 2 10*3/uL (ref 0.7–4.0)
MCH: 25.3 pg — ABNORMAL LOW (ref 26.0–34.0)
MCHC: 28.5 g/dL — ABNORMAL LOW (ref 30.0–36.0)
MCV: 88.8 fL (ref 80.0–100.0)
Monocytes Absolute: 0.8 10*3/uL (ref 0.1–1.0)
Monocytes Relative: 8 %
Neutro Abs: 6.7 10*3/uL (ref 1.7–7.7)
Neutrophils Relative %: 67 %
Platelet Count: 297 10*3/uL (ref 150–400)
RBC: 3.75 MIL/uL — ABNORMAL LOW (ref 3.87–5.11)
RDW: 17.8 % — ABNORMAL HIGH (ref 11.5–15.5)
WBC Count: 9.8 10*3/uL (ref 4.0–10.5)
nRBC: 0 % (ref 0.0–0.2)

## 2023-09-28 MED ORDER — IRON SUCROSE 20 MG/ML IV SOLN
200.0000 mg | Freq: Once | INTRAVENOUS | Status: AC
  Administered 2023-09-28: 200 mg via INTRAVENOUS
  Filled 2023-09-28: qty 10

## 2023-09-28 NOTE — Assessment & Plan Note (Addendum)
#   Anemia- Hb-8.3; Ferritin- 6 [PCP- AUG 2024.]- WBC/platelets are normal. Patient is symptomatic.  Likely due to iron  deficiency - from etiology GI blood loss/ CKD- III.  NOT  gentle iron  [ constipation] .  Stopped iron .   #  Currently s/p Venofer ; Hb 9.5 - proceed with venofer -.   #Etiology of iron  deficiency:s/p AUG 2024-[[facility]]-EGD-duodenal ulcer;  colonoscopy- multiple polyps; JULY 2024- CT scan abdomen pelvis- no acute process.  S/p APRIL 2025- capsule study- Video capsule study April 2025 with several small AVMs scattered throughout small bowel, not actively bleeding.   # CAD s/p stent- CHF [dec 2024; march 2025, Dr.Arida] asprin + plavix   # CKD- III [PCP]/ DM - on Toesermide/Spirinolactine weight gain- defer to PCP.    # DISPOSITION: # venfoer today # venofer  weekly x 3 more # Follow up 2 months- MD; labs- cbc/bmp;iron  studies; ferritin-  possible venofer -  Dr.B

## 2023-09-28 NOTE — Progress Notes (Signed)
 [facility] CONSULT NOTE  Patient Care Team: Sari Cunning, MD as PCP - General (Internal Medicine) Wenona Hamilton, MD as PCP - Cardiology (Cardiology) Gwyn Leos, MD as Consulting Physician (Oncology)  CHIEF COMPLAINTS/PURPOSE OF CONSULTATION: ANEMIA   HEMATOLOGY HISTORY  # ANEMIA[Hb; MCV-platelets- WBC; Iron  sat; ferritin;  Kidney: stage III CKD- 30-40s.  CT/US - ;  colo/EGD: 2022;  EGD/colo- awaiting repeating EGD/Dr.Russow-   COPD with ongoing tobacco abuse, abnormal CT chest consistent with interstitial lung disease with recent pulmonary consultation, depression, diabetes, hypertension, PAD  HISTORY OF PRESENTING ILLNESS: Patient ambulating-independently. With family.    Misty Adams 69 y.o.  female pleasant patient with multiple medical problems-including CKD stage III CAD on aspirin  Plavix  and CHF and history of severeiron deficient anemia is here for follow-up.  Patient s/p IV venofer . Patient noted to having dyspnea.     Fell 2 weeks ago at home, hit the night stand, lt arm bruised.   Had a SCC removed from her rt forehead, Dr. Felipe Horton at [facility]  Patient denies any blood in stools.  Denies any black loose stool.     Review of Systems  Constitutional:  Positive for malaise/fatigue. Negative for chills, diaphoresis, fever and weight loss.  HENT:  Negative for nosebleeds and sore throat.   Eyes:  Negative for double vision.  Respiratory:  Negative for cough, hemoptysis, sputum production, shortness of breath and wheezing.   Cardiovascular:  Negative for chest pain, palpitations, orthopnea and leg swelling.  Gastrointestinal:  Negative for abdominal pain, blood in stool, constipation, diarrhea, heartburn, melena, nausea and vomiting.  Genitourinary:  Negative for dysuria, frequency and urgency.  Musculoskeletal:  Negative for back pain and joint pain.  Skin: Negative.  Negative for itching and rash.  Neurological:  Negative for  dizziness, tingling, focal weakness, weakness and headaches.  Endo/Heme/Allergies:  Does not bruise/bleed easily.  Psychiatric/Behavioral:  Negative for depression. The patient is not nervous/anxious and does not have insomnia.    MEDICAL HISTORY:  Past Medical History:  Diagnosis Date   Allergy    Anxiety    Chronic heart failure with preserved ejection fraction (HFpEF) (HCC)    a. 02/2023 Echo: EF 60-65%, no rwma, GrII DD, nl RV size/fxn, mild LAE, mild MS (mean grad ), mod MAC, mild AS (mean grad 9.7mmHg).   CKD (chronic kidney disease), stage III (HCC)    COPD (chronic obstructive pulmonary disease) (HCC)    Depression    Diabetes mellitus without complication (HCC)    Hyperlipidemia    Hypertension    Hypothyroidism    Interstitial lung disease (HCC)    PAD (peripheral artery disease) (HCC)    a. 07/2017 L subclavian stenosis s/p stenting.   Psoriasis    Squamous cell carcinoma of skin 08/25/2023   SCC IS,  Right Temple, referral sent for Mohs Dr. [facility]   Substance abuse [facility])    Tobacco abuse     SURGICAL HISTORY: Past Surgical History:  Procedure Laterality Date   ABDOMINAL HYSTERECTOMY     AORTIC ARCH ANGIOGRAPHY N/A 08/06/2017   Procedure: AORTIC ARCH ANGIOGRAPHY;  Surgeon: Arvil Lauber, MD;  Location: [facility] INVASIVE CV LAB;  Service: Cardiovascular;  Laterality: N/A;   APPENDECTOMY     BIOPSY  12/19/2022   Procedure: BIOPSY;  Surgeon: Quintin Buckle, DO;  Location: [facility] ENDOSCOPY;  Service: Gastroenterology;;   CESAREAN SECTION     COLONOSCOPY     COLONOSCOPY WITH PROPOFOL  N/A 12/07/2020   Procedure:  COLONOSCOPY WITH PROPOFOL ;  Surgeon: Quintin Buckle, DO;  Location: [facility] ENDOSCOPY;  Service: Gastroenterology;  Laterality: N/A;   COLONOSCOPY WITH PROPOFOL  N/A 12/19/2022   Procedure: COLONOSCOPY WITH PROPOFOL ;  Surgeon: Quintin Buckle, DO;  Location: [facility] ENDOSCOPY;  Service: Gastroenterology;  Laterality: N/A;   CORONARY STENT INTERVENTION N/A  03/26/2023   Procedure: CORONARY STENT INTERVENTION;  Surgeon: Arleen Lacer, MD;  Location: [facility] INVASIVE CV LAB;  Service: Cardiovascular;  Laterality: N/A;   dilatation and curettage     DILATION AND CURETTAGE OF UTERUS     ESOPHAGOGASTRODUODENOSCOPY N/A 12/07/2020   Procedure: ESOPHAGOGASTRODUODENOSCOPY (EGD);  Surgeon: Quintin Buckle, DO;  Location: [facility] ENDOSCOPY;  Service: Gastroenterology;  Laterality: N/A;   ESOPHAGOGASTRODUODENOSCOPY (EGD) WITH PROPOFOL  N/A 12/19/2022   Procedure: ESOPHAGOGASTRODUODENOSCOPY (EGD) WITH PROPOFOL ;  Surgeon: Quintin Buckle, DO;  Location: [facility] ENDOSCOPY;  Service: Gastroenterology;  Laterality: N/A;   KNEE SURGERY     left wrist ganglion cyst removal     OOPHORECTOMY  1994   PERIPHERAL VASCULAR INTERVENTION Left 08/06/2017   Procedure: PERIPHERAL VASCULAR INTERVENTION;  Surgeon: Arvil Lauber, MD;  Location: [facility] INVASIVE CV LAB;  Service: Cardiovascular;  Laterality: Left;  subclavian   POLYPECTOMY  12/19/2022   Procedure: POLYPECTOMY;  Surgeon: Quintin Buckle, DO;  Location: [facility] ENDOSCOPY;  Service: Gastroenterology;;   RIGHT/LEFT HEART CATH AND CORONARY ANGIOGRAPHY N/A 03/26/2023   Procedure: RIGHT/LEFT HEART CATH AND CORONARY ANGIOGRAPHY;  Surgeon: Arleen Lacer, MD;  Location: [facility] INVASIVE CV LAB;  Service: Cardiovascular;  Laterality: N/A;   TUBAL LIGATION      SOCIAL HISTORY: Social History   Socioeconomic History   Marital status: Divorced    Spouse name: Not on file   Number of children: 5   Years of education: Not on file   Highest education level: Associate degree: academic program  Occupational History   Occupation: Retired  Tobacco Use   Smoking status: Every Day    Current packs/day: 2.00    Average packs/day: 2.0 packs/day for 50.0 years (100.0 ttl pk-yrs)    Types: Cigarettes   Smokeless tobacco: Never  Vaping Use   Vaping status: Former   Substances: Nicotine  Substance and Sexual Activity    Alcohol use: Yes   Drug use: Not Currently   Sexual activity: Not Currently  Other Topics Concern   Not on file  Social History Narrative   Lives w/ 3 of her children   Social Drivers of Health   Financial Resource Strain: High Risk (05/07/2023)   Received from [facility]   Overall Financial Resource Strain (CARDIA)    Difficulty of Paying Living Expenses: Hard  Food Insecurity: No Food Insecurity (07/07/2023)   Hunger Vital Sign    Worried About Running Out of Food in the Last Year: Never true    Ran Out of Food in the Last Year: Never true  Recent Concern: Food Insecurity - Food Insecurity Present (05/07/2023)   Received from [facility]   Hunger Vital Sign    Worried About Running Out of Food in the Last Year: Sometimes true    Ran Out of Food in the Last Year: Sometimes true  Transportation Needs: No Transportation Needs (07/07/2023)   PRAPARE - Administrator, Civil Service (Medical): No    Lack of Transportation (Non-Medical): No  Physical Activity: Not on file  Stress: Not on file  Social Connections: Moderately Isolated (07/07/2023)   Social Connection and Isolation Panel [  NHANES]    Frequency of Communication with Friends and Family: More than three times a week    Frequency of Social Gatherings with Friends and Family: More than three times a week    Attends Religious Services: Never    Database administrator or Organizations: No    Attends Engineer, structural: 1 to 4 times per year    Marital Status: Divorced  Catering manager Violence: Not At Risk (07/07/2023)   Humiliation, Afraid, Rape, and Kick questionnaire    Fear of Current or Ex-Partner: No    Emotionally Abused: No    Physically Abused: No    Sexually Abused: No    FAMILY HISTORY: Family History  Problem Relation Age of Onset   Uterine cancer Mother    Stroke Mother    Aneurysm Mother    Heart attack Father    Coronary artery disease Father     Leukemia Father    Depression Father    Diabetes Father    Hyperlipidemia Father    Hypertension Father    Colon polyps Sister    Obesity Sister    Thyroid  disease Sister    COPD Sister    Lung cancer Sister    Breast cancer Maternal Aunt    Breast cancer Maternal Aunt     ALLERGIES:  is allergic to hydrocodone -acetaminophen , vicodin [hydrocodone -acetaminophen ], and trelegy ellipta [fluticasone-umeclidin-vilant].  MEDICATIONS:  Current Outpatient Medications  Medication Sig Dispense Refill   ACCU-CHEK GUIDE TEST test strip      Accu-Chek Softclix Lancets lancets SMARTSIG:Lancet Topical     acetaminophen  (TYLENOL ) 500 MG tablet Take 1,000 mg by mouth every 6 (six) hours as needed for moderate pain or headache.     albuterol  (VENTOLIN  HFA) 108 (90 Base) MCG/ACT inhaler Inhale 2 puffs into the lungs every 6 (six) hours as needed for wheezing or shortness of breath. 1 each 3   ALPRAZolam  (XANAX ) 0.5 MG tablet Take 0.5 mg by mouth 2 (two) times daily as needed for anxiety.      aspirin  81 MG tablet Take 81 mg by mouth at bedtime.      atorvastatin  (LIPITOR) 40 MG tablet Take 1 tablet (40 mg total) by mouth at bedtime. 30 tablet 0   bisoprolol  (ZEBETA ) 5 MG tablet TAKE 1 TABLET (5 MG TOTAL) BY MOUTH DAILY. 30 tablet 5   budeson-glycopyrrolate-formoterol  (BREZTRI AEROSPHERE) 160-9-4.8 MCG/ACT AERO inhaler Inhale 2 puffs into the lungs 2 (two) times daily.     clopidogrel  (PLAVIX ) 75 MG tablet Take 1 tablet (75 mg total) by mouth daily with breakfast. 90 tablet 3   Cyanocobalamin  (VITAMIN B-12) 500 MCG SUBL Place under the tongue daily.     empagliflozin  (JARDIANCE ) 10 MG TABS tablet Take 1 tablet (10 mg total) by mouth daily. 30 tablet 1   ezetimibe  (ZETIA ) 10 MG tablet TAKE 1 TABLET BY MOUTH EVERY DAY 30 tablet 5   gabapentin  (NEURONTIN ) 100 MG capsule Take 200 mg by mouth at bedtime.     levothyroxine  (SYNTHROID , LEVOTHROID) 125 MCG tablet Take 125 mcg by mouth daily.      OXYGEN  Inhale  2 L/L into the lungs at bedtime.     pantoprazole  (PROTONIX ) 40 MG tablet Take 40 mg by mouth daily.     potassium chloride  SA (KLOR-CON  M20) 20 MEQ tablet Take 40 mEq by mouth 2 (two) times daily.     spironolactone  (ALDACTONE ) 25 MG tablet TAKE 1 TABLET (25 MG TOTAL) BY MOUTH DAILY. (Patient taking differently:  Take 12.5 mg by mouth daily.) 30 tablet 5   torsemide  (DEMADEX ) 20 MG tablet Take 2 tablets (40 mg total) by mouth daily. 60 tablet 5   traZODone  (DESYREL ) 100 MG tablet Take 100 mg by mouth daily as needed for sleep.     venlafaxine  XR (EFFEXOR -XR) 75 MG 24 hr capsule Take 75 mg by mouth daily with breakfast.     ondansetron  (ZOFRAN ) 4 MG tablet Take 4 mg by mouth every 4 (four) hours as needed for nausea or vomiting. (Patient not taking: Reported on 07/29/2023)     oxyCODONE -acetaminophen  (PERCOCET) 5-325 MG tablet Take 1 tablet by mouth every 4 (four) hours as needed for severe pain (pain score 7-10). (Patient not taking: Reported on 09/28/2023) 20 tablet 0   No current facility-administered medications for this visit.     PHYSICAL EXAMINATION:   Vitals:   09/28/23 1403  BP: (!) 136/35  Pulse: 66  Resp: 18  Temp: 99.3 F (37.4 C)  SpO2: 92%     Filed Weights   09/28/23 1403  Weight: 172 lb (78 kg)      Physical Exam Vitals and nursing note reviewed.  HENT:     Head: Normocephalic and atraumatic.     Mouth/Throat:     Pharynx: Oropharynx is clear.  Eyes:     Extraocular Movements: Extraocular movements intact.     Pupils: Pupils are equal, round, and reactive to light.  Cardiovascular:     Rate and Rhythm: Normal rate and regular rhythm.  Pulmonary:     Comments: Decreased breath sounds bilaterally.  Abdominal:     Palpations: Abdomen is soft.  Musculoskeletal:        General: Normal range of motion.     Cervical back: Normal range of motion.  Skin:    General: Skin is warm.  Neurological:     General: No focal deficit present.     Mental Status: She is  alert and oriented to person, place, and time.  Psychiatric:        Behavior: Behavior normal.        Judgment: Judgment normal.      LABORATORY DATA:  I have reviewed the data as listed Lab Results  Component Value Date   WBC 9.8 09/28/2023   HGB 9.5 (L) 09/28/2023   HCT 33.3 (L) 09/28/2023   MCV 88.8 09/28/2023   PLT 297 09/28/2023   Recent Labs    12/08/22 1446 01/21/23 1346 03/08/23 2325 03/09/23 0648 07/06/23 2211 07/07/23 0207 07/21/23 1412 07/28/23 1111 07/29/23 1249 08/27/23 1054 09/28/23 1407  NA 134*   < > 139   < > 134*   < > 136   < > 136 141 138  K 3.1*   < > 4.3   < > 2.4*   < > 3.3*   < > 3.7 4.5 3.7  CL 92*   < > 103   < > 94*   < > 97*   < > 96* 98 96*  CO2 31   < > 26   < > 27   < > 30   < > 29 26 32  GLUCOSE 118*   < > 147*   < > 137*   < > 103*   < > 123* 91 81  BUN 25*   < > 50*   < > 69*   < > 31*   < > 25* 34* 29*  CREATININE 1.38*   < > 2.01*   < >  2.15*   < > 1.64*   < > 1.56* 1.62* 1.74*  CALCIUM  9.3   < > 8.9   < > 9.4   < > 8.9   < > 8.7* 9.7 8.4*  GFRNONAA 41*   < > 26*   < > 24*   < > 33*  --  36*  --  31*  PROT 7.5  --  7.2  --  6.6  --   --   --   --   --   --   ALBUMIN  3.7  --  3.4*  --  3.5  --   --   --   --   --   --   AST 14*  --  15  --  10*  --   --   --   --   --   --   ALT 11  --  21  --  9  --   --   --   --   --   --   ALKPHOS 67  --  80  --  55  --   --   --   --   --   --   BILITOT 0.4  --  0.9  --  0.6  --   --   --   --   --   --    < > = values in this interval not displayed.     No results found.   ASSESSMENT & PLAN:   Acute on chronic anemia # Anemia- Hb-8.3; Ferritin- 6 [PCP- AUG 2024.]- WBC/platelets are normal. Patient is symptomatic.  Likely due to iron  deficiency - from etiology GI blood loss/ CKD- III.  NOT  gentle iron  [ constipation] .  Stopped iron .   #  Currently s/p Venofer ; Hb 9.5 - proceed with venofer -.   #Etiology of iron  deficiency:s/p AUG 2024-[[facility]]-EGD-duodenal ulcer;  colonoscopy-  multiple polyps; JULY 2024- CT scan abdomen pelvis- no acute process.  S/p APRIL 2025- capsule study- Video capsule study April 2025 with several small AVMs scattered throughout small bowel, not actively bleeding.   # CAD s/p stent- CHF [dec 2024; march 2025, Dr.Arida] asprin + plavix   # CKD- III [PCP]/ DM - on Toesermide/Spirinolactine weight gain- defer to PCP.    # DISPOSITION: # venfoer today # venofer  weekly x 3 more # Follow up 2 months- MD; labs- cbc/bmp;iron  studies; ferritin-  possible venofer -  Dr.B  All questions were answered. The patient knows to call the clinic with any problems, questions or concerns.   Gwyn Leos, MD 09/28/2023 3:20 PM

## 2023-09-28 NOTE — Progress Notes (Signed)
 Fatigue/weakness: YES Dyspena: YES WORSE ON O2-2L QHS Light headedness: OCCASIONALLY  Blood in stool: NO   Fell 2 weeks ago at home, hit the night stand, lt arm bruised.  Had a SCC removed from her rt forehead, Dr. Felipe Horton at [facility].

## 2023-10-05 ENCOUNTER — Inpatient Hospital Stay

## 2023-10-05 VITALS — BP 127/48 | HR 67 | Temp 97.3°F | Resp 20

## 2023-10-05 DIAGNOSIS — D649 Anemia, unspecified: Secondary | ICD-10-CM

## 2023-10-05 DIAGNOSIS — D509 Iron deficiency anemia, unspecified: Secondary | ICD-10-CM | POA: Diagnosis not present

## 2023-10-05 MED ORDER — IRON SUCROSE 20 MG/ML IV SOLN
200.0000 mg | Freq: Once | INTRAVENOUS | Status: AC
  Administered 2023-10-05: 200 mg via INTRAVENOUS

## 2023-10-05 MED ORDER — SODIUM CHLORIDE 0.9% FLUSH
10.0000 mL | Freq: Once | INTRAVENOUS | Status: AC | PRN
  Filled 2023-10-05: qty 10

## 2023-10-12 ENCOUNTER — Inpatient Hospital Stay

## 2023-10-12 ENCOUNTER — Inpatient Hospital Stay (HOSPITAL_BASED_OUTPATIENT_CLINIC_OR_DEPARTMENT_OTHER): Admitting: Nurse Practitioner

## 2023-10-12 VITALS — BP 135/45 | HR 55 | Temp 97.2°F | Resp 20

## 2023-10-12 DIAGNOSIS — D509 Iron deficiency anemia, unspecified: Secondary | ICD-10-CM | POA: Diagnosis not present

## 2023-10-12 DIAGNOSIS — T80818A Extravasation of other vesicant agent, initial encounter: Secondary | ICD-10-CM

## 2023-10-12 DIAGNOSIS — D649 Anemia, unspecified: Secondary | ICD-10-CM

## 2023-10-12 MED ORDER — IRON SUCROSE 20 MG/ML IV SOLN
200.0000 mg | Freq: Once | INTRAVENOUS | Status: AC
  Administered 2023-10-12: 200 mg via INTRAVENOUS

## 2023-10-12 MED ORDER — SODIUM CHLORIDE 0.9% FLUSH
10.0000 mL | Freq: Once | INTRAVENOUS | Status: AC | PRN
  Administered 2023-10-12: 10 mL
  Filled 2023-10-12: qty 10

## 2023-10-12 NOTE — Progress Notes (Signed)
 Symptom Management Clinic  [facility] at [facility] A Department of the [place]. [facility] [address] [place], [place] [address] [phone] (phone) [phone] (fax)  Patient Care Team: Cleotilde Oneil FALCON, MD as PCP - General (Internal Medicine) Darron Deatrice LABOR, MD as PCP - Cardiology (Cardiology) Rennie Cindy SAUNDERS, MD as Consulting Physician (Oncology)   Name of the patient: Misty Adams  [id number]  [birth year]   Date of visit: 10/12/23  Diagnosis- iron  deficiency  Chief complaint/ Reason for visit- IV site irritation  Heme/Onc history:  Oncology History   No history exists.    Interval history- Called to infusion for patient reports of discoloration at site of previous IV. She received IV venofer  on 6/16 and noticed a day later to have some localized redness/tenderness. The discoloration has remained. Not worsened. Tenderness has mostly resolved. She has not applied anythign to the area.   Review of systems- per HPI   Allergies  Allergen Reactions   Hydrocodone -Acetaminophen  Hives   Vicodin [Hydrocodone -Acetaminophen ] Hives   Trelegy Ellipta [Fluticasone-Umeclidin-Vilant] Rash    Changed to Budeson-Glycopyrrol-Formoterol  by provider    Current Outpatient Medications:    ACCU-CHEK GUIDE TEST test strip, , Disp: , Rfl:    Accu-Chek Softclix Lancets lancets, SMARTSIG:Lancet Topical, Disp: , Rfl:    acetaminophen  (TYLENOL ) 500 MG tablet, Take 1,000 mg by mouth every 6 (six) hours as needed for moderate pain or headache., Disp: , Rfl:    albuterol  (VENTOLIN  HFA) 108 (90 Base) MCG/ACT inhaler, Inhale 2 puffs into the lungs every 6 (six) hours as needed for wheezing or shortness of breath., Disp: 1 each, Rfl: 3   ALPRAZolam  (XANAX ) 0.5 MG tablet, Take 0.5 mg by mouth 2 (two) times daily as needed for anxiety. , Disp: , Rfl:    aspirin  81 MG tablet, Take 81 mg by mouth at bedtime. , Disp: , Rfl:    atorvastatin   (LIPITOR) 40 MG tablet, Take 1 tablet (40 mg total) by mouth at bedtime., Disp: 30 tablet, Rfl: 0   bisoprolol  (ZEBETA ) 5 MG tablet, TAKE 1 TABLET (5 MG TOTAL) BY MOUTH DAILY., Disp: 30 tablet, Rfl: 5   budeson-glycopyrrolate -formoterol  (BREZTRI  AEROSPHERE) 160-9-4.8 MCG/ACT AERO inhaler, Inhale 2 puffs into the lungs 2 (two) times daily., Disp: , Rfl:    clopidogrel  (PLAVIX ) 75 MG tablet, Take 1 tablet (75 mg total) by mouth daily with breakfast., Disp: 90 tablet, Rfl: 3   Cyanocobalamin  (VITAMIN B-12) 500 MCG SUBL, Place under the tongue daily., Disp: , Rfl:    empagliflozin  (JARDIANCE ) 10 MG TABS tablet, Take 1 tablet (10 mg total) by mouth daily., Disp: 30 tablet, Rfl: 1   ezetimibe  (ZETIA ) 10 MG tablet, TAKE 1 TABLET BY MOUTH EVERY DAY, Disp: 30 tablet, Rfl: 5   gabapentin  (NEURONTIN ) 100 MG capsule, Take 200 mg by mouth at bedtime., Disp: , Rfl:    levothyroxine  (SYNTHROID , LEVOTHROID) 125 MCG tablet, Take 125 mcg by mouth daily. , Disp: , Rfl:    ondansetron  (ZOFRAN ) 4 MG tablet, Take 4 mg by mouth every 4 (four) hours as needed for nausea or vomiting. (Patient not taking: Reported on 07/29/2023), Disp: , Rfl:    oxyCODONE -acetaminophen  (PERCOCET) 5-325 MG tablet, Take 1 tablet by mouth every 4 (four) hours as needed for severe pain (pain score 7-10). (Patient not taking: Reported on 09/28/2023), Disp: 20 tablet, Rfl: 0   OXYGEN , Inhale 2 L/L into the lungs at bedtime., Disp: , Rfl:    pantoprazole  (PROTONIX )  40 MG tablet, Take 40 mg by mouth daily., Disp: , Rfl:    potassium chloride  SA (KLOR-CON  M20) 20 MEQ tablet, Take 40 mEq by mouth 2 (two) times daily., Disp: , Rfl:    spironolactone  (ALDACTONE ) 25 MG tablet, TAKE 1 TABLET (25 MG TOTAL) BY MOUTH DAILY. (Patient taking differently: Take 12.5 mg by mouth daily.), Disp: 30 tablet, Rfl: 5   torsemide  (DEMADEX ) 20 MG tablet, Take 2 tablets (40 mg total) by mouth daily., Disp: 60 tablet, Rfl: 5   traZODone  (DESYREL ) 100 MG tablet, Take 100 mg by  mouth daily as needed for sleep., Disp: , Rfl:    venlafaxine  XR (EFFEXOR -XR) 75 MG 24 hr capsule, Take 75 mg by mouth daily with breakfast., Disp: , Rfl:   Physical exam: There were no vitals filed for this visit. Physical Exam Constitutional:      Appearance: She is not ill-appearing.  Pulmonary:     Effort: No respiratory distress.  Skin:    Comments: Prior IV site assessed- slight darkening of skin irregular shaped, approx 4 cm x 2 cm. Nontender.   Neurological:     Mental Status: She is alert and oriented to person, place, and time.     Assessment and plan- Patient is a 68 y.o. female currently receiving IV iron  who returns to IV iron  infusion and reports:   Skin irritation- at previous IV site post venofer  infusion. Otherwise tolerated well. Question if some of venofer  may have caused local irritation and/or staining of the skin. Advised venofer  is not a vesicant. Nontender. No evidence of phlebitis. Warm compresses may help to improve symptoms but should resolve over time. Patient agreeable to continue infusion today.    Visit Diagnosis 1. Injection site extravasation, initial encounter    Patient expressed understanding and was in agreement with this plan. She also understands that She can call clinic at any time with any questions, concerns, or complaints.   Thank you for allowing me to participate in the care of this very pleasant patient.   Tinnie Dawn, DNP, AGNP-C, AOCNP [facility] at [facility] [phone]

## 2023-10-12 NOTE — Progress Notes (Signed)
 Upon arrival pt reports swelling and redness ( that has improved over the last week) to left [facility] (previous IV site). Pt reports site was tender for 2 days post infusion but is no longer tender. Pt reports that there was not pain or swelling during the Venofer  push last treatment. Dr. Rennie aware, and Tinnie Dawn NP to assess pt.  Tinnie Dawn NP at chairside  1510: Pt stable at discharge.

## 2023-10-19 ENCOUNTER — Inpatient Hospital Stay
Admission: EM | Admit: 2023-10-19 | Discharge: 2023-10-22 | DRG: 291 | Disposition: A | Discharge status: Home / Self Care | Attending: Internal Medicine | Admitting: Internal Medicine

## 2023-10-19 ENCOUNTER — Other Ambulatory Visit: Payer: Self-pay

## 2023-10-19 ENCOUNTER — Inpatient Hospital Stay

## 2023-10-19 ENCOUNTER — Emergency Department

## 2023-10-19 ENCOUNTER — Telehealth: Payer: Self-pay

## 2023-10-19 DIAGNOSIS — J9621 Acute and chronic respiratory failure with hypoxia: Secondary | ICD-10-CM | POA: Diagnosis present

## 2023-10-19 DIAGNOSIS — Z833 Family history of diabetes mellitus: Secondary | ICD-10-CM

## 2023-10-19 DIAGNOSIS — I5032 Chronic diastolic (congestive) heart failure: Secondary | ICD-10-CM | POA: Diagnosis not present

## 2023-10-19 DIAGNOSIS — E039 Hypothyroidism, unspecified: Secondary | ICD-10-CM | POA: Diagnosis present

## 2023-10-19 DIAGNOSIS — Z1152 Encounter for screening for COVID-19: Secondary | ICD-10-CM

## 2023-10-19 DIAGNOSIS — Z7982 Long term (current) use of aspirin: Secondary | ICD-10-CM

## 2023-10-19 DIAGNOSIS — Z7984 Long term (current) use of oral hypoglycemic drugs: Secondary | ICD-10-CM

## 2023-10-19 DIAGNOSIS — Z8349 Family history of other endocrine, nutritional and metabolic diseases: Secondary | ICD-10-CM

## 2023-10-19 DIAGNOSIS — I2510 Atherosclerotic heart disease of native coronary artery without angina pectoris: Secondary | ICD-10-CM | POA: Diagnosis present

## 2023-10-19 DIAGNOSIS — D509 Iron deficiency anemia, unspecified: Secondary | ICD-10-CM | POA: Diagnosis present

## 2023-10-19 DIAGNOSIS — Z79899 Other long term (current) drug therapy: Secondary | ICD-10-CM | POA: Diagnosis not present

## 2023-10-19 DIAGNOSIS — E1122 Type 2 diabetes mellitus with diabetic chronic kidney disease: Secondary | ICD-10-CM | POA: Diagnosis present

## 2023-10-19 DIAGNOSIS — E785 Hyperlipidemia, unspecified: Secondary | ICD-10-CM | POA: Diagnosis present

## 2023-10-19 DIAGNOSIS — Z90710 Acquired absence of both cervix and uterus: Secondary | ICD-10-CM

## 2023-10-19 DIAGNOSIS — F32A Depression, unspecified: Secondary | ICD-10-CM | POA: Diagnosis present

## 2023-10-19 DIAGNOSIS — Z87891 Personal history of nicotine dependence: Secondary | ICD-10-CM | POA: Diagnosis not present

## 2023-10-19 DIAGNOSIS — I5033 Acute on chronic diastolic (congestive) heart failure: Secondary | ICD-10-CM | POA: Diagnosis present

## 2023-10-19 DIAGNOSIS — J9601 Acute respiratory failure with hypoxia: Principal | ICD-10-CM

## 2023-10-19 DIAGNOSIS — Z888 Allergy status to other drugs, medicaments and biological substances status: Secondary | ICD-10-CM

## 2023-10-19 DIAGNOSIS — Z7902 Long term (current) use of antithrombotics/antiplatelets: Secondary | ICD-10-CM

## 2023-10-19 DIAGNOSIS — N1832 Chronic kidney disease, stage 3b: Secondary | ICD-10-CM | POA: Diagnosis present

## 2023-10-19 DIAGNOSIS — I130 Hypertensive heart and chronic kidney disease with heart failure and stage 1 through stage 4 chronic kidney disease, or unspecified chronic kidney disease: Secondary | ICD-10-CM | POA: Diagnosis present

## 2023-10-19 DIAGNOSIS — Z885 Allergy status to narcotic agent status: Secondary | ICD-10-CM

## 2023-10-19 DIAGNOSIS — Z85828 Personal history of other malignant neoplasm of skin: Secondary | ICD-10-CM | POA: Diagnosis not present

## 2023-10-19 DIAGNOSIS — J432 Centrilobular emphysema: Secondary | ICD-10-CM | POA: Diagnosis not present

## 2023-10-19 DIAGNOSIS — Z8249 Family history of ischemic heart disease and other diseases of the circulatory system: Secondary | ICD-10-CM

## 2023-10-19 DIAGNOSIS — L409 Psoriasis, unspecified: Secondary | ICD-10-CM | POA: Diagnosis present

## 2023-10-19 DIAGNOSIS — E1151 Type 2 diabetes mellitus with diabetic peripheral angiopathy without gangrene: Secondary | ICD-10-CM | POA: Diagnosis present

## 2023-10-19 DIAGNOSIS — Z9981 Dependence on supplemental oxygen: Secondary | ICD-10-CM

## 2023-10-19 DIAGNOSIS — F17200 Nicotine dependence, unspecified, uncomplicated: Secondary | ICD-10-CM | POA: Insufficient documentation

## 2023-10-19 DIAGNOSIS — Z7951 Long term (current) use of inhaled steroids: Secondary | ICD-10-CM

## 2023-10-19 DIAGNOSIS — Z8711 Personal history of peptic ulcer disease: Secondary | ICD-10-CM

## 2023-10-19 DIAGNOSIS — N184 Chronic kidney disease, stage 4 (severe): Secondary | ICD-10-CM | POA: Diagnosis present

## 2023-10-19 DIAGNOSIS — I272 Pulmonary hypertension, unspecified: Secondary | ICD-10-CM | POA: Diagnosis present

## 2023-10-19 DIAGNOSIS — J441 Chronic obstructive pulmonary disease with (acute) exacerbation: Secondary | ICD-10-CM | POA: Diagnosis present

## 2023-10-19 DIAGNOSIS — Z818 Family history of other mental and behavioral disorders: Secondary | ICD-10-CM

## 2023-10-19 DIAGNOSIS — Z720 Tobacco use: Secondary | ICD-10-CM | POA: Insufficient documentation

## 2023-10-19 DIAGNOSIS — Z825 Family history of asthma and other chronic lower respiratory diseases: Secondary | ICD-10-CM

## 2023-10-19 DIAGNOSIS — Z955 Presence of coronary angioplasty implant and graft: Secondary | ICD-10-CM

## 2023-10-19 DIAGNOSIS — Z79890 Hormone replacement therapy (postmenopausal): Secondary | ICD-10-CM | POA: Diagnosis not present

## 2023-10-19 DIAGNOSIS — I509 Heart failure, unspecified: Secondary | ICD-10-CM

## 2023-10-19 DIAGNOSIS — F419 Anxiety disorder, unspecified: Secondary | ICD-10-CM | POA: Diagnosis present

## 2023-10-19 DIAGNOSIS — I10 Essential (primary) hypertension: Secondary | ICD-10-CM | POA: Diagnosis not present

## 2023-10-19 DIAGNOSIS — J449 Chronic obstructive pulmonary disease, unspecified: Secondary | ICD-10-CM | POA: Diagnosis present

## 2023-10-19 DIAGNOSIS — Z83438 Family history of other disorder of lipoprotein metabolism and other lipidemia: Secondary | ICD-10-CM

## 2023-10-19 DIAGNOSIS — R0602 Shortness of breath: Secondary | ICD-10-CM | POA: Diagnosis present

## 2023-10-19 DIAGNOSIS — G8929 Other chronic pain: Secondary | ICD-10-CM | POA: Diagnosis present

## 2023-10-19 HISTORY — DX: Encounter for other specified aftercare: Z51.89

## 2023-10-19 HISTORY — DX: Atherosclerotic heart disease of native coronary artery without angina pectoris: I25.10

## 2023-10-19 LAB — BASIC METABOLIC PANEL WITH GFR
Anion gap: 6 (ref 5–15)
BUN: 33 mg/dL — ABNORMAL HIGH (ref 8–23)
CO2: 30 mmol/L (ref 22–32)
Calcium: 8.9 mg/dL (ref 8.9–10.3)
Chloride: 105 mmol/L (ref 98–111)
Creatinine, Ser: 1.74 mg/dL — ABNORMAL HIGH (ref 0.44–1.00)
GFR, Estimated: 31 mL/min — ABNORMAL LOW (ref >60)
Glucose, Bld: 97 mg/dL (ref 70–99)
Potassium: 4.4 mmol/L (ref 3.5–5.1)
Sodium: 141 mmol/L (ref 135–145)

## 2023-10-19 LAB — CBC
HCT: 33.4 % — ABNORMAL LOW (ref 36.0–46.0)
Hemoglobin: 9.3 g/dL — ABNORMAL LOW (ref 12.0–15.0)
MCH: 24.8 pg — ABNORMAL LOW (ref 26.0–34.0)
MCHC: 27.8 g/dL — ABNORMAL LOW (ref 30.0–36.0)
MCV: 89.1 fL (ref 80.0–100.0)
Platelets: 318 10*3/uL (ref 150–400)
RBC: 3.75 MIL/uL — ABNORMAL LOW (ref 3.87–5.11)
RDW: 20.4 % — ABNORMAL HIGH (ref 11.5–15.5)
WBC: 7.8 10*3/uL (ref 4.0–10.5)
nRBC: 0 % (ref 0.0–0.2)

## 2023-10-19 LAB — RESP PANEL BY RT-PCR (RSV, FLU A&B, COVID)  RVPGX2
Influenza A by PCR: NEGATIVE
Influenza B by PCR: NEGATIVE
Resp Syncytial Virus by PCR: NEGATIVE
SARS Coronavirus 2 by RT PCR: NEGATIVE

## 2023-10-19 LAB — BRAIN NATRIURETIC PEPTIDE: B Natriuretic Peptide: 851.2 pg/mL — ABNORMAL HIGH (ref 0.0–100.0)

## 2023-10-19 LAB — TROPONIN I (HIGH SENSITIVITY): Troponin I (High Sensitivity): 8 ng/L (ref <18)

## 2023-10-19 MED ORDER — ENOXAPARIN SODIUM 40 MG/0.4ML IJ SOSY
40.0000 mg | PREFILLED_SYRINGE | INTRAMUSCULAR | Status: DC
  Administered 2023-10-19: 40 mg via SUBCUTANEOUS
  Filled 2023-10-19: qty 0.4

## 2023-10-19 MED ORDER — IPRATROPIUM-ALBUTEROL 0.5-2.5 (3) MG/3ML IN SOLN
3.0000 mL | Freq: Once | RESPIRATORY_TRACT | Status: AC
  Administered 2023-10-19: 3 mL via RESPIRATORY_TRACT
  Filled 2023-10-19: qty 3

## 2023-10-19 MED ORDER — POTASSIUM CHLORIDE CRYS ER 20 MEQ PO TBCR
40.0000 meq | EXTENDED_RELEASE_TABLET | Freq: Two times a day (BID) | ORAL | Status: DC
  Administered 2023-10-20 – 2023-10-22 (×5): 40 meq via ORAL
  Filled 2023-10-19 (×5): qty 2

## 2023-10-19 MED ORDER — OXYCODONE-ACETAMINOPHEN 5-325 MG PO TABS: 1.0000 | ORAL_TABLET | ORAL | Status: DC | PRN

## 2023-10-19 MED ORDER — BISOPROLOL FUMARATE 5 MG PO TABS: 5.0000 mg | ORAL_TABLET | Freq: Every day | ORAL | Status: DC

## 2023-10-19 MED ORDER — ALPRAZOLAM 0.5 MG PO TABS
0.5000 mg | ORAL_TABLET | Freq: Two times a day (BID) | ORAL | Status: DC | PRN
  Administered 2023-10-20 – 2023-10-21 (×2): 0.5 mg via ORAL
  Filled 2023-10-19 (×2): qty 1

## 2023-10-19 MED ORDER — EMPAGLIFLOZIN 10 MG PO TABS
10.0000 mg | ORAL_TABLET | Freq: Every day | ORAL | Status: DC
  Administered 2023-10-20 – 2023-10-22 (×3): 10 mg via ORAL
  Filled 2023-10-19 (×3): qty 1

## 2023-10-19 MED ORDER — BUDESON-GLYCOPYRROL-FORMOTEROL 160-9-4.8 MCG/ACT IN AERO
2.0000 | INHALATION_SPRAY | Freq: Two times a day (BID) | RESPIRATORY_TRACT | Status: DC
  Administered 2023-10-19 – 2023-10-22 (×6): 2 via RESPIRATORY_TRACT
  Filled 2023-10-19: qty 5.9

## 2023-10-19 MED ORDER — PANTOPRAZOLE SODIUM 40 MG PO TBEC
40.0000 mg | DELAYED_RELEASE_TABLET | Freq: Every day | ORAL | Status: DC
  Administered 2023-10-20 – 2023-10-22 (×3): 40 mg via ORAL
  Filled 2023-10-19 (×3): qty 1

## 2023-10-19 MED ORDER — SPIRONOLACTONE 12.5 MG HALF TABLET
12.5000 mg | ORAL_TABLET | Freq: Every day | ORAL | Status: DC
  Administered 2023-10-20 – 2023-10-22 (×3): 12.5 mg via ORAL
  Filled 2023-10-19 (×3): qty 1

## 2023-10-19 MED ORDER — ASPIRIN 81 MG PO TBEC
81.0000 mg | DELAYED_RELEASE_TABLET | Freq: Every day | ORAL | Status: DC
  Administered 2023-10-19 – 2023-10-21 (×3): 81 mg via ORAL
  Filled 2023-10-19 (×3): qty 1

## 2023-10-19 MED ORDER — VENLAFAXINE HCL ER 75 MG PO CP24
75.0000 mg | ORAL_CAPSULE | Freq: Every day | ORAL | Status: DC
  Administered 2023-10-20 – 2023-10-22 (×3): 75 mg via ORAL
  Filled 2023-10-19 (×3): qty 1

## 2023-10-19 MED ORDER — FUROSEMIDE 10 MG/ML IJ SOLN
80.0000 mg | Freq: Two times a day (BID) | INTRAMUSCULAR | Status: DC
  Administered 2023-10-19 – 2023-10-22 (×5): 80 mg via INTRAVENOUS
  Filled 2023-10-19 (×6): qty 8

## 2023-10-19 MED ORDER — METHYLPREDNISOLONE SODIUM SUCC 125 MG IJ SOLR
125.0000 mg | INTRAMUSCULAR | Status: AC
  Administered 2023-10-19: 125 mg via INTRAVENOUS
  Filled 2023-10-19: qty 2

## 2023-10-19 MED ORDER — GABAPENTIN 100 MG PO CAPS
200.0000 mg | ORAL_CAPSULE | Freq: Every day | ORAL | Status: DC
  Administered 2023-10-19 – 2023-10-21 (×3): 200 mg via ORAL
  Filled 2023-10-19 (×3): qty 2

## 2023-10-19 MED ORDER — ATORVASTATIN CALCIUM 20 MG PO TABS
40.0000 mg | ORAL_TABLET | Freq: Every day | ORAL | Status: DC
  Administered 2023-10-19 – 2023-10-21 (×3): 40 mg via ORAL
  Filled 2023-10-19 (×3): qty 2

## 2023-10-19 MED ORDER — FUROSEMIDE 10 MG/ML IJ SOLN
80.0000 mg | Freq: Once | INTRAMUSCULAR | Status: AC
  Administered 2023-10-19: 80 mg via INTRAVENOUS
  Filled 2023-10-19: qty 8

## 2023-10-19 MED ORDER — TRAZODONE HCL 100 MG PO TABS
100.0000 mg | ORAL_TABLET | Freq: Every evening | ORAL | Status: DC | PRN
  Administered 2023-10-19 – 2023-10-21 (×3): 100 mg via ORAL
  Filled 2023-10-19 (×3): qty 1

## 2023-10-19 MED ORDER — ALBUTEROL SULFATE (2.5 MG/3ML) 0.083% IN NEBU
5.0000 mg | INHALATION_SOLUTION | Freq: Once | RESPIRATORY_TRACT | Status: AC
  Administered 2023-10-19: 5 mg via RESPIRATORY_TRACT
  Filled 2023-10-19: qty 6

## 2023-10-19 MED ORDER — LEVOTHYROXINE SODIUM 25 MCG PO TABS
125.0000 ug | ORAL_TABLET | Freq: Every day | ORAL | Status: DC
  Administered 2023-10-20 – 2023-10-22 (×3): 125 ug via ORAL
  Filled 2023-10-19: qty 3
  Filled 2023-10-19 (×2): qty 1

## 2023-10-19 MED ORDER — CLOPIDOGREL BISULFATE 75 MG PO TABS
75.0000 mg | ORAL_TABLET | Freq: Every day | ORAL | Status: DC
  Administered 2023-10-20 – 2023-10-22 (×3): 75 mg via ORAL
  Filled 2023-10-19 (×3): qty 1

## 2023-10-19 NOTE — Progress Notes (Deleted)
 Advanced Heart Failure Clinic Note    PCP: Cleotilde Oneil FALCON, MD (last seen 03/25) Cardiologist: [name], MD/ Loistine Sober, NP (last seen 03/25)  Chief Complaint: fatigue  HPI:  Ms Gathright is a 68 y/o female with a history of COPD, HTN, hyperlipidemia, mild pHTN, GERD, subclavian artery stenosis (stented 07/2017), CKD, pulmonary HTN, CAD, iron  deficiency anemia, hypothyroidism, PVD, tobacco use and chronic heart failure.   Admitted 07/14/22 with E. coli UTI, severe sepsis, and AKI. High-sensitivity troponin rose to 56. She did not undergo cardiology evaluation. In August 2024 CT chest performed secondary to weight loss showed aortic atherosclerosis, unchanged 4 mm right apical pulmonary nodule, enlarged pulmonary trunk consistent with PAH, left adrenal adenoma.   Admitted 03/08/23 due to waking up short of breath & orthopnea after returning from a cruise. BNP 710. CXR showed heart failure. Initially placed on bipap and weaned down to nasal cannula. IV lasix  given. Hypokalemia corrected. Echocardiogram performed on 03/08/2023 showed ejection fraction 60 to 65% with grade 2 diastolic dysfunction. Symptoms improved.   Admitted 03/23/23 due to worsening of shortness of breath, leg swelling and significant weight gain. Developed hypoxia and placed on 5L nasal cannula. BNP elevated and given IV lasix . Cardiology consulted. Chest x-ray showed evidence of pulmonary edema with pleural effusion. CT shows signs consistent with pulmonary hypertension. Oxygen  weaned down to 2L. L and R heart cath 12/5 with stent placed to RCA. Hypokalemia corrected.   RHC/ LHC 03/26/23:   Prox RCA lesion is 45% stenosed.   CULPRIT LESION: Mid RCA lesion is 90% stenosed.   A drug-eluting stent was successfully placed using a STENT ONYX FRONTIER 3.0X22.   Post intervention, there is a 0% residual stenosis.   -------------------------------------   Hemodynamic findings consistent with mild pulmonary  hypertension.  (PAP 49/24-33 mmHg, PCWP 24 mmHg transpulmonary Gradient 9 mmHg); LVEDP 16 mmHg.   Compensated CHF: LVEDP 16 mmHg, PCWP 24 mmHg.   Recommend uninterrupted dual antiplatelet therapy with Aspirin  81mg  daily and Clopidogrel  75mg  daily for a minimum of 12 months (ACS-Class I recommendation).  Admitted 07/06/23 with lightheadedness and multiple presyncopal episodes in preceding 24 hours leading to a fall on her lower back. Hypotensive in ED 95/45. RBC transfusion given for hemoglobin of 6.6. Was also given IV iron  infusion. No injuries noted on imaging in the ED. Given IVF for hypotension. GI consulted and will have outpatient follow-up.   Seen in [facility] 04/25 and spironolactone  12.5mg  daily was started.   She presents for a HF follow-up visit with a chief complaint of fatigue. Has associated weakness, shortness of breath, minimal abdominal distention & bloating along with this. Denies chest pain, palpitations, pedal edema or dizziness. Headed to the beach next weekend.   Continues to smoke 1.5 ppd. Did not take her medications yet today.   ROS: All systems negative except as listed in HPI, PMH and Problem List.  SH:  Social History   Socioeconomic History   Marital status: Divorced    Spouse name: Not on file   Number of children: 5   Years of education: Not on file   Highest education level: Associate degree: academic program  Occupational History   Occupation: Retired  Tobacco Use   Smoking status: Every Day    Current packs/day: 1.00    Average packs/day: 1 pack/day for 60.0 years (60.0 ttl pk-yrs)    Types: Cigarettes   Smokeless tobacco: Never  Vaping Use   Vaping status: Former   Substances: Nicotine  Substance and Sexual Activity   Alcohol use: Not Currently   Drug use: Not Currently   Sexual activity: Not Currently  Other Topics Concern   Not on file  Social History Narrative   Lives w/ 3 of her children   Social Drivers of Health   Financial Resource  Strain: High Risk (05/07/2023)   Received from [facility]   Overall Financial Resource Strain (CARDIA)    Difficulty of Paying Living Expenses: Hard  Food Insecurity: No Food Insecurity (07/07/2023)   Hunger Vital Sign    Worried About Running Out of Food in the Last Year: Never true    Ran Out of Food in the Last Year: Never true  Recent Concern: Food Insecurity - Food Insecurity Present (05/07/2023)   Received from [facility]   Hunger Vital Sign    Within the past 12 months, you worried that your food would run out before you got the money to buy more.: Sometimes true    Within the past 12 months, the food you bought just didn't last and you didn't have money to get more.: Sometimes true  Transportation Needs: No Transportation Needs (07/07/2023)   PRAPARE - Administrator, Civil Service (Medical): No    Lack of Transportation (Non-Medical): No  Physical Activity: Not on file  Stress: Not on file  Social Connections: Moderately Isolated (07/07/2023)   Social Connection and Isolation Panel    Frequency of Communication with Friends and Family: More than three times a week    Frequency of Social Gatherings with Friends and Family: More than three times a week    Attends Religious Services: Never    Database administrator or Organizations: No    Attends Engineer, structural: 1 to 4 times per year    Marital Status: Divorced  Catering manager Violence: Not At Risk (07/07/2023)   Humiliation, Afraid, Rape, and Kick questionnaire    Fear of Current or Ex-Partner: No    Emotionally Abused: No    Physically Abused: No    Sexually Abused: No    FH:  Family History  Problem Relation Age of Onset   Uterine cancer Mother    Stroke Mother    Aneurysm Mother    Heart attack Father    Coronary artery disease Father    Leukemia Father    Depression Father    Diabetes Father    Hyperlipidemia Father    Hypertension Father    Colon  polyps Sister    Obesity Sister    Thyroid  disease Sister    COPD Sister    Lung cancer Sister    Breast cancer Maternal Aunt    Breast cancer Maternal Aunt     Past Medical History:  Diagnosis Date   Allergy    Anxiety    Blood transfusion without reported diagnosis    CHF (congestive heart failure) (HCC)    Chronic heart failure with preserved ejection fraction (HFpEF) (HCC)    a. 02/2023 Echo: EF 60-65%, no rwma, GrII DD, nl RV size/fxn, mild LAE, mild MS (mean grad ), mod MAC, mild AS (mean grad 9.27mmHg).   CKD (chronic kidney disease), stage III (HCC)    COPD (chronic obstructive pulmonary disease) (HCC)    Coronary artery disease    Depression    Diabetes mellitus without complication (HCC)    Hyperlipidemia    Hypertension    Hypothyroidism    Interstitial lung disease (HCC)  PAD (peripheral artery disease) (HCC)    a. 07/2017 L subclavian stenosis s/p stenting.   Psoriasis    Squamous cell carcinoma of skin 08/25/2023   SCC IS,  [facility], referral sent for Mohs Dr. Corey   Substance abuse [facility])    Tobacco abuse     No current facility-administered medications for this visit.   Current Outpatient Medications  Medication Sig Dispense Refill   ACCU-CHEK GUIDE TEST test strip      Accu-Chek Softclix Lancets lancets SMARTSIG:Lancet Topical     acetaminophen  (TYLENOL ) 500 MG tablet Take 1,000 mg by mouth every 6 (six) hours as needed for moderate pain or headache.     albuterol  (VENTOLIN  HFA) 108 (90 Base) MCG/ACT inhaler Inhale 2 puffs into the lungs every 6 (six) hours as needed for wheezing or shortness of breath. 1 each 3   ALPRAZolam  (XANAX ) 0.5 MG tablet Take 0.5 mg by mouth 2 (two) times daily as needed for anxiety.      aspirin  81 MG tablet Take 81 mg by mouth at bedtime.      atorvastatin  (LIPITOR) 40 MG tablet Take 1 tablet (40 mg total) by mouth at bedtime. 30 tablet 0   bisoprolol  (ZEBETA ) 5 MG tablet TAKE 1 TABLET (5 MG TOTAL) BY MOUTH DAILY. 30  tablet 5   budeson-glycopyrrolate-formoterol  (BREZTRI AEROSPHERE) 160-9-4.8 MCG/ACT AERO inhaler Inhale 2 puffs into the lungs 2 (two) times daily.     clopidogrel  (PLAVIX ) 75 MG tablet Take 1 tablet (75 mg total) by mouth daily with breakfast. 90 tablet 3   Cyanocobalamin  (VITAMIN B-12) 500 MCG SUBL Place under the tongue daily.     empagliflozin  (JARDIANCE ) 10 MG TABS tablet Take 1 tablet (10 mg total) by mouth daily. 30 tablet 1   ezetimibe  (ZETIA ) 10 MG tablet TAKE 1 TABLET BY MOUTH EVERY DAY 30 tablet 5   gabapentin  (NEURONTIN ) 100 MG capsule Take 200 mg by mouth at bedtime.     levothyroxine  (SYNTHROID , LEVOTHROID) 125 MCG tablet Take 125 mcg by mouth daily.      ondansetron  (ZOFRAN ) 4 MG tablet Take 4 mg by mouth every 4 (four) hours as needed for nausea or vomiting. (Patient not taking: Reported on 07/29/2023)     oxyCODONE -acetaminophen  (PERCOCET) 5-325 MG tablet Take 1 tablet by mouth every 4 (four) hours as needed for severe pain (pain score 7-10). 20 tablet 0   OXYGEN  Inhale 2 L/L into the lungs at bedtime.     pantoprazole  (PROTONIX ) 40 MG tablet Take 40 mg by mouth daily.     potassium chloride  SA (KLOR-CON  M20) 20 MEQ tablet Take 60 mEq by mouth 2 (two) times daily.     spironolactone  (ALDACTONE ) 25 MG tablet TAKE 1 TABLET (25 MG TOTAL) BY MOUTH DAILY. (Patient taking differently: Take 12.5 mg by mouth daily.) 30 tablet 5   torsemide  (DEMADEX ) 20 MG tablet Take 2 tablets (40 mg total) by mouth daily. 60 tablet 5   traZODone  (DESYREL ) 100 MG tablet Take 100 mg by mouth daily as needed for sleep.     venlafaxine  XR (EFFEXOR -XR) 75 MG 24 hr capsule Take 75 mg by mouth daily with breakfast.     Facility-Administered Medications Ordered in Other Visits  Medication Dose Route Frequency Provider Last Rate Last Admin   ALPRAZolam  (XANAX ) tablet 0.5 mg  0.5 mg Oral BID PRN Wouk, Devaughn Sayres, MD       aspirin  EC tablet 81 mg  81 mg Oral QHS Wouk, Devaughn Sayres, MD  atorvastatin   (LIPITOR) tablet 40 mg  40 mg Oral QHS Wouk, Devaughn Sayres, MD       budesonide -glycopyrrolate-formoterol  (BREZTRI) 160-9-4.8 MCG/ACT inhaler 2 puff  2 puff Inhalation BID Wouk, Devaughn Sayres, MD       NOREEN ON 10/20/2023] clopidogrel  (PLAVIX ) tablet 75 mg  75 mg Oral Q breakfast Wouk, Devaughn Sayres, MD       NOREEN ON 10/20/2023] empagliflozin  (JARDIANCE ) tablet 10 mg  10 mg Oral Daily Wouk, Devaughn Sayres, MD       enoxaparin  (LOVENOX ) injection 40 mg  40 mg Subcutaneous Q24H Wouk, Devaughn Sayres, MD       furosemide  (LASIX ) injection 80 mg  80 mg Intravenous BID Wouk, Devaughn Sayres, MD       gabapentin  (NEURONTIN ) capsule 200 mg  200 mg Oral QHS Wouk, Devaughn Sayres, MD       [START ON 10/20/2023] levothyroxine  (SYNTHROID ) tablet 125 mcg  125 mcg Oral Q0600 Wouk, Devaughn Sayres, MD       oxyCODONE -acetaminophen  (PERCOCET/ROXICET) 5-325 MG per tablet 1 tablet  1 tablet Oral Q4H PRN Wouk, Devaughn Sayres, MD       NOREEN ON 10/20/2023] pantoprazole  (PROTONIX ) EC tablet 40 mg  40 mg Oral Daily Wouk, Devaughn Sayres, MD       NOREEN ON 10/20/2023] potassium chloride  SA (KLOR-CON  M) CR tablet 40 mEq  40 mEq Oral BID Wouk, Devaughn Sayres, MD       NOREEN ON 10/20/2023] spironolactone  (ALDACTONE ) tablet 12.5 mg  12.5 mg Oral Daily Wouk, Devaughn Sayres, MD       traZODone  (DESYREL ) tablet 100 mg  100 mg Oral QHS PRN Wouk, Devaughn Sayres, MD       NOREEN ON 10/20/2023] venlafaxine  XR (EFFEXOR -XR) 24 hr capsule 75 mg  75 mg Oral Q breakfast Wouk, Devaughn Sayres, MD       There were no vitals filed for this visit.  Wt Readings from Last 3 Encounters:  10/19/23 172 lb (78 kg)  09/28/23 172 lb (78 kg)  08/27/23 167 lb 9.6 oz (76 kg)   Lab Results  Component Value Date   CREATININE 1.74 (H) 10/19/2023   CREATININE 1.74 (H) 09/28/2023   CREATININE 1.62 (H) 08/27/2023    PHYSICAL EXAM:  General: Well appearing. No resp difficulty HEENT: normal Neck: supple, no JVD Cor: Regular rhythm, bradycardic. No rubs, gallops or murmurs Lungs:  clear Abdomen: soft, nontender, nondistended. Extremities: no cyanosis, clubbing, rash, edema Neuro: alert & oriented X 3. Moves all 4 extremities w/o difficulty. Affect pleasant   ECG: not done    ASSESSMENT & PLAN:  1: Ischemic heart failure with preserved ejection fraction- - cath with stent 03/26/23 - NYHA class II - weighing daily; reminded to call for overnight weight gain > 2 pounds or a weekly weight gain  >5 pounds - weight stable from last visit here 1 month ago - Echo 03/09/23: EF 60-65% with Grade II DD, mild LAE, mild AS - continue bisoprolol  5mg  daily - continue jardiance  10mg  daily  - continue potassium 40meq BID - continue spironolactone  12.5mg  daily - continue torsemide  40mg  daily - BMET today - not adding salt and has been looking at food labels - BNP 06/29/23 was 192.3  2: HTN- - BP 123/62 - saw PCP Ted) 03/25 - BMP 07/29/23 reviewed: sodium 136, potassium 3.7, creatinine 1.56, GFR 36 - BMET today  3: CAD- - saw cardiology ([place]) 03/25 - continue clopidogrel  75mg  daily - continue ASA 81mg  daily - RHC/ LHC 03/26/23:  Prox RCA lesion is 45% stenosed.   CULPRIT LESION: Mid RCA lesion is 90% stenosed.   A drug-eluting stent was successfully placed using a STENT ONYX FRONTIER 3.0X22.   Post intervention, there is a 0% residual stenosis.   Hemodynamic findings consistent with mild pulmonary hypertension.  (PAP 49/24-33 mmHg, PCWP 24 mmHg transpulmonary Gradient 9 mmHg); LVEDP 16 mmHg.  4: COPD (managed by pulmonology)- - continue albuterol  inhaler PRN; she estimates that she uses this ~ once / month - continue breztri  - saw pulmonology Alica) 04/25 - wearing oxygen  at 2L at bedtime only  5: Anemia (managed by hematology)- - continue ferrous sulfate  325mg  daily - saw hematology Oza) 04/25 - iron  infusion done 08/25/23 - Hg 07/29/23 was 8.8  6: Hyperlipidemia- - continue atorvastatin  40mg  daily - continue ezetimibe  10mg  daily - LDL  05/05/23 was 37 - lipo (a) 03/27/23 was 54.2  7: Tobacco use- - smoking 1.5 ppd of cigarettes - states that she is considering stopping smoking because of her health - cessation encouraged   Return in 6 months, sooner if needed  Ellouise Class, [place] 08/26/23

## 2023-10-19 NOTE — Progress Notes (Signed)
 Pt called stating she is having shortness of breath and may have fluid rentention. Offered pt an appt for tomorrow (7/1) Pt agreed to appt with 10/20/23 @ 10:30 AM.

## 2023-10-19 NOTE — ED Triage Notes (Signed)
 Pt to ED from home POV for SOB since 3 days. Wears 2L at baseline. Endorses intermittent mid chest pressure, none right now. Current smoker 1 pack/day X 60 years. Takes torseminde and has not missed any doses.   84% on regular 2L, inreased to 4L.

## 2023-10-19 NOTE — H&P (Addendum)
 History and Physical    Misty Adams FMW:[id number] DOB: 27-Apr-1955 DOA: 10/19/2023  PCP: Cleotilde Oneil FALCON, MD  Patient coming from: home   Chief Complaint: swelling, dyspnea  HPI: Misty Adams is a 68 y.o. female with medical history significant for hfpef, cad s/p stent, copd, 2 liters of oxygen  at night, hypothyroid, ckd 3b, dm, pad, iron  deficiency anemia, presenting with the above.  For about the past week has had progressive lower extremity swelling, weight gain, dyspnea, orthopnea, cough. No fever. No chest pain or palpitations. No nausea/vomiting/diarrhea. No sick contacts. No recent med changes. Has doubled up on her torsemide  for several days and symptoms have not improved.     Review of Systems: As per HPI otherwise 10 point review of systems negative.    Past Medical History:  Diagnosis Date   Allergy    Anxiety    Blood transfusion without reported diagnosis    CHF (congestive heart failure) (HCC)    Chronic heart failure with preserved ejection fraction (HFpEF) (HCC)    a. 02/2023 Echo: EF 60-65%, no rwma, GrII DD, nl RV size/fxn, mild LAE, mild MS (mean grad ), mod MAC, mild AS (mean grad 9.88mmHg).   CKD (chronic kidney disease), stage III (HCC)    COPD (chronic obstructive pulmonary disease) (HCC)    Coronary artery disease    Depression    Diabetes mellitus without complication (HCC)    Hyperlipidemia    Hypertension    Hypothyroidism    Interstitial lung disease (HCC)    PAD (peripheral artery disease) (HCC)    a. 07/2017 L subclavian stenosis s/p stenting.   Psoriasis    Squamous cell carcinoma of skin 08/25/2023   SCC IS,  Right [facility], referral sent for Mohs Dr. Corey   Substance abuse [facility])    Tobacco abuse     Past Surgical History:  Procedure Laterality Date   ABDOMINAL HYSTERECTOMY     AORTIC ARCH ANGIOGRAPHY N/A 08/06/2017   Procedure: AORTIC ARCH ANGIOGRAPHY;  Surgeon: Laurence Redell CROME, MD;  Location: [facility] INVASIVE CV LAB;  Service:  Cardiovascular;  Laterality: N/A;   APPENDECTOMY     BIOPSY  12/19/2022   Procedure: BIOPSY;  Surgeon: Onita Elspeth Sharper, DO;  Location: [facility] ENDOSCOPY;  Service: Gastroenterology;;   CESAREAN SECTION     COLONOSCOPY     COLONOSCOPY WITH PROPOFOL  N/A 12/07/2020   Procedure: COLONOSCOPY WITH PROPOFOL ;  Surgeon: Onita Elspeth Sharper, DO;  Location: [facility] ENDOSCOPY;  Service: Gastroenterology;  Laterality: N/A;   COLONOSCOPY WITH PROPOFOL  N/A 12/19/2022   Procedure: COLONOSCOPY WITH PROPOFOL ;  Surgeon: Onita Elspeth Sharper, DO;  Location[facility] ENDOSCOPY;  Service: Gastroenterology;  Laterality: N/A;   CORONARY STENT INTERVENTION N/A 03/26/2023   Procedure: CORONARY STENT INTERVENTION;  Surgeon: Anner Alm ORN, MD;  Location: [facility] INVASIVE CV LAB;  Service: Cardiovascular;  Laterality: N/A;   dilatation and curettage     DILATION AND CURETTAGE OF UTERUS     ESOPHAGOGASTRODUODENOSCOPY N/A 12/07/2020   Procedure: ESOPHAGOGASTRODUODENOSCOPY (EGD);  Surgeon: Onita Elspeth Sharper, DO;  Location: [facility] ENDOSCOPY;  Service: Gastroenterology;  Laterality: N/A;   ESOPHAGOGASTRODUODENOSCOPY (EGD) WITH PROPOFOL  N/A 12/19/2022   Procedure: ESOPHAGOGASTRODUODENOSCOPY (EGD) WITH PROPOFOL ;  Surgeon: Onita Elspeth Sharper, DO;  Location: [facility] ENDOSCOPY;  Service: Gastroenterology;  Laterality: N/A;   KNEE SURGERY     left wrist ganglion cyst removal     OOPHORECTOMY  1994   PERIPHERAL VASCULAR INTERVENTION Left 08/06/2017   Procedure: PERIPHERAL VASCULAR INTERVENTION;  Surgeon: Laurence Redell  L, MD;  Location: [facility] INVASIVE CV LAB;  Service: Cardiovascular;  Laterality: Left;  subclavian   POLYPECTOMY  12/19/2022   Procedure: POLYPECTOMY;  Surgeon: Onita Elspeth Sharper, DO;  Location: [facility] ENDOSCOPY;  Service: Gastroenterology;;   RIGHT/LEFT HEART CATH AND CORONARY ANGIOGRAPHY N/A 03/26/2023   Procedure: RIGHT/LEFT HEART CATH AND CORONARY ANGIOGRAPHY;  Surgeon: Anner Alm ORN, MD;  Location: [facility] INVASIVE CV LAB;   Service: Cardiovascular;  Laterality: N/A;   TUBAL LIGATION       reports that she has been smoking cigarettes. She has a 60 pack-year smoking history. She has never used smokeless tobacco. She reports that she does not currently use alcohol. She reports that she does not currently use drugs.  Allergies  Allergen Reactions   Hydrocodone -Acetaminophen  Hives   Vicodin [Hydrocodone -Acetaminophen ] Hives   Trelegy Ellipta [Fluticasone-Umeclidin-Vilant] Rash    Changed to Budeson-Glycopyrrol-Formoterol  by provider    Family History  Problem Relation Age of Onset   Uterine cancer Mother    Stroke Mother    Aneurysm Mother    Heart attack Father    Coronary artery disease Father    Leukemia Father    Depression Father    Diabetes Father    Hyperlipidemia Father    Hypertension Father    Colon polyps Sister    Obesity Sister    Thyroid  disease Sister    COPD Sister    Lung cancer Sister    Breast cancer Maternal Aunt    Breast cancer Maternal Aunt     Prior to Admission medications   Medication Sig Start Date End Date Taking? Authorizing Provider  acetaminophen  (TYLENOL ) 500 MG tablet Take 1,000 mg by mouth every 6 (six) hours as needed for moderate pain or headache.   Yes [provider]  albuterol  (VENTOLIN  HFA) 108 (90 Base) MCG/ACT inhaler Inhale 2 puffs into the lungs every 6 (six) hours as needed for wheezing or shortness of breath. 03/28/23  Yes Patel, Sona, MD  oxyCODONE -acetaminophen  (PERCOCET) 5-325 MG tablet Take 1 tablet by mouth every 4 (four) hours as needed for severe pain (pain score 7-10). 07/08/23  Yes Fausto Burnard LABOR, DO  ACCU-CHEK GUIDE TEST test strip  04/05/23   [provider]  Accu-Chek Softclix Lancets lancets SMARTSIG:Lancet Topical 04/05/23   [provider]  ALPRAZolam  (XANAX ) 0.5 MG tablet Take 0.5 mg by mouth 2 (two) times daily as needed for anxiety.  01/16/17   [provider]  aspirin  81 MG tablet Take 81 mg by  mouth at bedtime.     [provider]  atorvastatin  (LIPITOR) 40 MG tablet Take 1 tablet (40 mg total) by mouth at bedtime. 07/14/17   Judd Betters, MD  bisoprolol  (ZEBETA ) 5 MG tablet TAKE 1 TABLET (5 MG TOTAL) BY MOUTH DAILY. 05/25/23   Donette Ellouise LABOR, FNP  budeson-glycopyrrolate-formoterol  (BREZTRI AEROSPHERE) 160-9-4.8 MCG/ACT AERO inhaler Inhale 2 puffs into the lungs 2 (two) times daily.    [provider]  clopidogrel  (PLAVIX ) 75 MG tablet Take 1 tablet (75 mg total) by mouth daily with breakfast. 06/11/23   Donette Ellouise LABOR, FNP  Cyanocobalamin  (VITAMIN B-12) 500 MCG SUBL Place under the tongue daily.    [provider]  empagliflozin  (JARDIANCE ) 10 MG TABS tablet Take 1 tablet (10 mg total) by mouth daily. 03/29/23   Patel, Sona, MD  ezetimibe  (ZETIA ) 10 MG tablet TAKE 1 TABLET BY MOUTH EVERY DAY 05/25/23   Donette Ellouise A, FNP  gabapentin  (NEURONTIN ) 100 MG capsule Take 200  mg by mouth at bedtime.    [provider]  levothyroxine  (SYNTHROID , LEVOTHROID) 125 MCG tablet Take 125 mcg by mouth daily.     [provider]  ondansetron  (ZOFRAN ) 4 MG tablet Take 4 mg by mouth every 4 (four) hours as needed for nausea or vomiting. Patient not taking: Reported on 07/29/2023    [provider]  OXYGEN  Inhale 2 L/L into the lungs at bedtime.    [provider]  pantoprazole  (PROTONIX ) 40 MG tablet Take 40 mg by mouth daily.    [provider]  potassium chloride  SA (KLOR-CON  M20) 20 MEQ tablet Take 40 mEq by mouth 2 (two) times daily.    [provider]  spironolactone  (ALDACTONE ) 25 MG tablet TAKE 1 TABLET (25 MG TOTAL) BY MOUTH DAILY. Patient taking differently: Take 12.5 mg by mouth daily. 05/25/23   Donette Ellouise LABOR, FNP  torsemide  (DEMADEX ) 20 MG tablet Take 2 tablets (40 mg total) by mouth daily. 08/06/23   Donette Ellouise LABOR, FNP  traZODone  (DESYREL ) 100 MG tablet Take 100 mg by mouth daily as needed for sleep. 08/31/20    [provider]  venlafaxine  XR (EFFEXOR -XR) 75 MG 24 hr capsule Take 75 mg by mouth daily with breakfast. 07/15/23 07/14/24  [provider]    Physical Exam: Vitals:   10/19/23 1522 10/19/23 1533 10/19/23 1600 10/19/23 1630  BP:  (!) 140/84 117/74 (!) 141/62  Pulse:   (!) 57 62  Resp:   17 14  Temp: 98.9 F (37.2 C)     TempSrc: Oral     SpO2: 93%  96% 100%  Weight:      Height:        Constitutional: No acute distress Head: Atraumatic Eyes: Conjunctiva clear ENM: Moist mucous membranes. Normal dentition.  Neck: Supple Respiratory: scattered rhonchi Cardiovascular: Regular rate and rhythm. Soft systolic murmur Abdomen: Non-tender, non-distended. No masses.   Musculoskeletal: No joint deformity upper and lower extremities. Normal ROM, no contractures. Normal muscle tone.  Skin: No rashes, lesions, or ulcers.  Extremities: pitting edema to lower thighs b/l Neurologic: Alert, moving all 4 extremities. Psychiatric: Normal insight and judgement.   Labs on Admission: I have personally reviewed following labs and imaging studies  CBC: Recent Labs  Lab 10/19/23 1523  WBC 7.8  HGB 9.3*  HCT 33.4*  MCV 89.1  PLT 318   Basic Metabolic Panel: Recent Labs  Lab 10/19/23 1523  NA 141  K 4.4  CL 105  CO2 30  GLUCOSE 97  BUN 33*  CREATININE 1.74*  CALCIUM  8.9   GFR: Estimated Creatinine Clearance: 30.4 mL/min (A) (by C-G formula based on SCr of 1.74 mg/dL (H)). Liver Function Tests: No results for input(s): AST, ALT, ALKPHOS, BILITOT, PROT, ALBUMIN  in the last 168 hours. No results for input(s): LIPASE, AMYLASE in the last 168 hours. No results for input(s): AMMONIA in the last 168 hours. Coagulation Profile: No results for input(s): INR, PROTIME in the last 168 hours. Cardiac Enzymes: No results for input(s): CKTOTAL, CKMB, CKMBINDEX, TROPONINI in the last 168 hours. BNP (last 3 results) No results for input(s):  PROBNP in the last 8760 hours. HbA1C: No results for input(s): HGBA1C in the last 72 hours. CBG: No results for input(s): GLUCAP in the last 168 hours. Lipid Profile: No results for input(s): CHOL, HDL, LDLCALC, TRIG, CHOLHDL, LDLDIRECT in the last 72 hours. Thyroid  Function Tests: No results for input(s): TSH, T4TOTAL, FREET4, T3FREE, THYROIDAB in the last 72 hours.  Anemia Panel: No results for input(s): VITAMINB12, FOLATE, FERRITIN, TIBC, IRON , RETICCTPCT in the last 72 hours. Urine analysis:    Component Value Date/Time   COLORURINE STRAW (A) 07/07/2023 0035   APPEARANCEUR CLEAR (A) 07/07/2023 0035   LABSPEC 1.009 07/07/2023 0035   PHURINE 6.0 07/07/2023 0035   GLUCOSEU 50 (A) 07/07/2023 0035   HGBUR NEGATIVE 07/07/2023 0035   BILIRUBINUR NEGATIVE 07/07/2023 0035   KETONESUR NEGATIVE 07/07/2023 0035   PROTEINUR NEGATIVE 07/07/2023 0035   NITRITE NEGATIVE 07/07/2023 0035   LEUKOCYTESUR NEGATIVE 07/07/2023 0035    Radiological Exams on Admission: DG Chest Portable 1 View Result Date: 10/19/2023 CLINICAL DATA:  Shortness of breath and cough EXAM: PORTABLE CHEST 1 VIEW COMPARISON:  06/29/2023, chest CT 12/08/2022 FINDINGS: Mild cardiomegaly with central vascular congestion. Possible small left effusion. Aortic atherosclerosis. Stent superior to the aortic arch. IMPRESSION: Mild cardiomegaly with central vascular congestion and possible small left effusion. Electronically Signed   By: Luke Bun M.D.   On: 10/19/2023 16:17    EKG: Independently reviewed. nsr  Assessment/Plan Principal Problem:   Acute exacerbation of CHF (congestive heart failure) (HCC) Active Problems:   Chronic heart failure with preserved ejection fraction (HFpEF) (HCC)   Hypothyroidism   Chronic obstructive pulmonary disease (HCC)   Dependence on nocturnal oxygen  therapy   Essential hypertension   Chronic kidney disease, stage 3b (HCC)   Pulmonary hypertension,  unspecified (HCC)   CAD in native artery   Diabetes mellitus with peripheral vascular disease (HCC)   # HFpEF with acute exacerbation TTE 02/2023 showed preserved EF, grade 2 DD, mild mitral stenosis, mild aortic stenosis. Dry weight is around 160, today she is 172, with significant edema, elevated BNP, and vascular congestion on CXR. No chest pain to suggest ischemic process. With worsening oxygen  requirement as well. Given lasix  80 mg IV in the ER. Hemodynamically stable - continue lasix  80 mg IV bid for home torsemide  40 mg daily - continue home potassium supplement, may require additional - daily weights, strict I/os - heart health fluid restricted diet - holding on TTE as has been obtained within the past year though would pursue if fails to respond to diuresis - hold home zebeta  - continue home farxiga, spironolactone   # Acute on chronic hypoxic respiratory failure On nocturnal o2 at baseline, here requiring 4 liters - [place] O2, wean as able  # CAD With DES to RCA 12/24. No chest pain - continue asa/plavix , statin  # COPD Do not think this is an acute exacerbation - home breztri  # Chronic iron  deficiency anemia Has had extensive w/u within the past year which revealed duodenal ulcer and multiple AVMs on capsule endoscopy. Is followed by hematology and gets serial IV iron . Hgb of 9.3 is at baseline, denies bleeding - monitor - outpt hematology f/u - cont home pantoprazole   # Chronic pain - home oxy, gabapentin   # T2DM Well controlled - monitor daily fasting sugars - home jardiance   # GAD - home xanax , effexor    # Hypothyroid - home synthroid   # CKD 3b Kidney function is at baseline - monitor  DVT prophylaxis: lovenox  Code Status: full  Family Communication: daughter updated @ bedside  Consults called: none   Level of care: Telemetry Cardiac Status is: Inpatient Remains inpatient appropriate because: severity of illness    Devaughn KATHEE Ban MD [facility] Pager [phone]  If 7PM-7AM, please contact night-coverage [email or web address] Password [email or web address]  10/19/2023, 5:05 PM

## 2023-10-19 NOTE — Progress Notes (Signed)
 Pt presented to clinic for scheduled venofer . She reports increased shortness of breath and bilateral pedal edema. She states that she has notified her cardiologist, but cannot be seen until tomorrow, and that she is going to the ED after infusion today. Reached out to hematologist to inquire if venofer  appropriate today. No response as of yet, but pt has decided to forego the venofer  and head to the ED. Scheduling notified to reschedule infusion.

## 2023-10-19 NOTE — ED Provider Notes (Signed)
 [facility] Provider Note    Event Date/Time   First MD Initiated Contact with Patient 10/19/23 1522     (approximate)   History   Chief Complaint: Shortness of Breath   HPI  Misty Adams is a 68 y.o. female with a history of CHF, CKD, COPD, hypertension diabetes who currently has home oxygen  to use only as needed who comes ED complaining of worsening shortness of breath for the past 2 weeks, gradual onset and gradual progression.  Associated with increased cough.  Has some intermittent chest pressure as well.  Reports increased dyspnea on exertion, orthopnea, leg swelling.  Has noticed a 7 pound weight gain over the past week.  She is compliant with her medications including daily torsemide .        Past Medical History:  Diagnosis Date   Allergy    Anxiety    Blood transfusion without reported diagnosis    CHF (congestive heart failure) (HCC)    Chronic heart failure with preserved ejection fraction (HFpEF) (HCC)    a. 02/2023 Echo: EF 60-65%, no rwma, GrII DD, nl RV size/fxn, mild LAE, mild MS (mean grad ), mod MAC, mild AS (mean grad 9.98mmHg).   CKD (chronic kidney disease), stage III (HCC)    COPD (chronic obstructive pulmonary disease) (HCC)    Coronary artery disease    Depression    Diabetes mellitus without complication (HCC)    Hyperlipidemia    Hypertension    Hypothyroidism    Interstitial lung disease (HCC)    PAD (peripheral artery disease) (HCC)    a. 07/2017 L subclavian stenosis s/p stenting.   Psoriasis    Squamous cell carcinoma of skin 08/25/2023   SCC IS,  [facility], referral sent for Mohs Dr. Corey   Substance abuse [facility])    Tobacco abuse     Current Outpatient Rx   Order #: [id number] Class: Historical Med   Order #: [id number] Class: Historical Med   Order #: [id number] Class: Historical Med   Order #: [id number] Class: Print   Order #: [id number] Class: Historical Med   Order #: [id number] Class: Historical Med    Order #: [id number] Class: Print   Order #: [id number] Class: Normal   Order #: [id number] Class: Historical Med   Order #: [id number] Class: Normal   Order #: [id number] Class: Historical Med   Order #: [id number] Class: Normal   Order #: [id number] Class: Normal   Order #: [id number] Class: Historical Med   Order #: [id number] Class: Historical Med   Order #: [id number] Class: Historical Med   Order #: [id number] Class: Normal   Order #: [id number] Class: Historical Med   Order #: [id number] Class: Historical Med   Order #: [id number] Class: Historical Med   Order #: [id number] Class: Normal   Order #: [id number] Class: Normal   Order #: [id number] Class: Historical Med   Order #: [id number] Class: Historical Med    Past Surgical History:  Procedure Laterality Date   ABDOMINAL HYSTERECTOMY     AORTIC ARCH ANGIOGRAPHY N/A 08/06/2017   Procedure: AORTIC ARCH ANGIOGRAPHY;  Surgeon: Laurence Redell CROME, MD;  Location: [facility] INVASIVE CV LAB;  Service: Cardiovascular;  Laterality: N/A;   APPENDECTOMY     BIOPSY  12/19/2022   Procedure: BIOPSY;  Surgeon: Onita Elspeth Sharper, DO;  Location: [facility] ENDOSCOPY;  Service: Gastroenterology;;   CESAREAN SECTION     COLONOSCOPY     COLONOSCOPY WITH PROPOFOL  N/A 12/07/2020   Procedure: COLONOSCOPY WITH PROPOFOL ;  Surgeon: Onita Elspeth Sharper, DO;  Location: [facility] ENDOSCOPY;  Service: Gastroenterology;  Laterality: N/A;   COLONOSCOPY  WITH PROPOFOL  N/A 12/19/2022   Procedure: COLONOSCOPY WITH PROPOFOL ;  Surgeon: Onita Elspeth Sharper, DO;  Location: [facility] ENDOSCOPY;  Service: Gastroenterology;  Laterality: N/A;   CORONARY STENT INTERVENTION N/A 03/26/2023   Procedure: CORONARY STENT INTERVENTION;  Surgeon: Anner Alm ORN, MD;  Location: [facility] INVASIVE CV LAB;  Service: Cardiovascular;  Laterality: N/A;   dilatation and curettage     DILATION AND CURETTAGE OF UTERUS     ESOPHAGOGASTRODUODENOSCOPY N/A 12/07/2020   Procedure: ESOPHAGOGASTRODUODENOSCOPY (EGD);  Surgeon: Onita Elspeth Sharper, DO;   Location: [facility] ENDOSCOPY;  Service: Gastroenterology;  Laterality: N/A;   ESOPHAGOGASTRODUODENOSCOPY (EGD) WITH PROPOFOL  N/A 12/19/2022   Procedure: ESOPHAGOGASTRODUODENOSCOPY (EGD) WITH PROPOFOL ;  Surgeon: Onita Elspeth Sharper, DO;  Location: [facility] ENDOSCOPY;  Service: Gastroenterology;  Laterality: N/A;   KNEE SURGERY     left wrist ganglion cyst removal     OOPHORECTOMY  1994   PERIPHERAL VASCULAR INTERVENTION Left 08/06/2017   Procedure: PERIPHERAL VASCULAR INTERVENTION;  Surgeon: Laurence Redell CROME, MD;  Location: [facility] INVASIVE CV LAB;  Service: Cardiovascular;  Laterality: Left;  subclavian   POLYPECTOMY  12/19/2022   Procedure: POLYPECTOMY;  Surgeon: Onita Elspeth Sharper, DO;  Location: [facility] ENDOSCOPY;  Service: Gastroenterology;;   RIGHT/LEFT HEART CATH AND CORONARY ANGIOGRAPHY N/A 03/26/2023   Procedure: RIGHT/LEFT HEART CATH AND CORONARY ANGIOGRAPHY;  Surgeon: Anner Alm ORN, MD;  Location: [facility] INVASIVE CV LAB;  Service: Cardiovascular;  Laterality: N/A;   TUBAL LIGATION      Physical Exam   Triage Vital Signs: ED Triage Vitals  Encounter Vitals Group     BP --      Girls Systolic BP Percentile --      Girls Diastolic BP Percentile --      Boys Systolic BP Percentile --      Boys Diastolic BP Percentile --      Pulse Rate 10/19/23 1519 62     Resp 10/19/23 1519 20     Temp 10/19/23 1522 98.9 F (37.2 C)     Temp Source 10/19/23 1519 Oral     SpO2 10/19/23 1519 (!) 84 %     Weight 10/19/23 1520 172 lb (78 kg)     Height 10/19/23 1520 5' 4 (1.626 m)     Head Circumference --      Peak Flow --      Pain Score 10/19/23 1516 0     Pain Loc --      Pain Education --      Exclude from Growth Chart --     Most recent vital signs: Vitals:   10/19/23 1522 10/19/23 1533  BP:  (!) 140/84  Pulse:    Resp:    Temp: 98.9 F (37.2 C)   SpO2: 93%     General: Awake, mild respiratory distress. CV:  Good peripheral perfusion.  Regular rate rhythm Resp:  Normal effort.   Diminished breath sounds bilateral bases with crackles.  There is diffuse expiratory wheezing. Abd:  No distention.  Soft nontender Other:  2+ pitting edema bilateral lower extremities.   ED Results / Procedures / Treatments   Labs (all labs ordered are listed, but only abnormal results are displayed) Labs Reviewed  BASIC METABOLIC PANEL WITH GFR - Abnormal; Notable for the following components:      Result Value   BUN 33 (*)    Creatinine, Ser 1.74 (*)    GFR, Estimated 31 (*)    All other components within normal limits  CBC - Abnormal; Notable for  the following components:   RBC 3.75 (*)    Hemoglobin 9.3 (*)    HCT 33.4 (*)    MCH 24.8 (*)    MCHC 27.8 (*)    RDW 20.4 (*)    All other components within normal limits  BRAIN NATRIURETIC PEPTIDE - Abnormal; Notable for the following components:   B Natriuretic Peptide 851.2 (*)    All other components within normal limits  RESP PANEL BY RT-PCR (RSV, FLU A&B, COVID)  RVPGX2  TROPONIN I (HIGH SENSITIVITY)     EKG Interpreted by me Sinus rhythm rate of 60.  Normal axis, normal intervals.  Normal QRS ST segments and T waves.  No ischemic changes.   RADIOLOGY Chest x-ray interpreted by me, shows mild pulmonary edema in lower lung fields.  Radiology report reviewed   PROCEDURES:  Procedures   MEDICATIONS ORDERED IN ED: Medications  methylPREDNISolone  sodium succinate (SOLU-MEDROL ) 125 mg/2 mL injection 125 mg (125 mg Intravenous Given 10/19/23 1606)  ipratropium-albuterol  (DUONEB) 0.5-2.5 (3) MG/3ML nebulizer solution 3 mL (3 mLs Nebulization Given 10/19/23 1613)  albuterol  (PROVENTIL ) (2.5 MG/3ML) 0.083% nebulizer solution 5 mg (5 mg Nebulization Given 10/19/23 1613)  furosemide  (LASIX ) injection 80 mg (80 mg Intravenous Given 10/19/23 1605)     IMPRESSION / MDM / ASSESSMENT AND PLAN / ED COURSE  I reviewed the triage vital signs and the nursing notes.  DDx: Decompensated CHF, AKI, electrolyte derangement, anemia,  COPD exacerbation, COVID, influenza, non-STEMI, pneumonia  Patient's presentation is most consistent with acute presentation with potential threat to life or bodily function.  Patient presents with worsening shortness of breath and CHF symptoms.  She is now requiring 4 L nasal cannula continuously to maintain normoxia which is new.  Has some wheezing as well.  Will give IV Lasix  for initial diuresis.  Will give Solu-Medrol  bronchodilators.  Will need to hospitalize for acute hypoxic respiratory failure and continue diuresis.  Not septic.   ----------------------------------------- 4:44 PM on 10/19/2023 ----------------------------------------- Case discussed with hospitalist      FINAL CLINICAL IMPRESSION(S) / ED DIAGNOSES   Final diagnoses:  Acute respiratory failure with hypoxia (HCC)  Acute on chronic congestive heart failure, unspecified heart failure type (HCC)  COPD exacerbation (HCC)     Rx / DC Orders   ED Discharge Orders     None        Note:  This document was prepared using Dragon voice recognition software and may include unintentional dictation errors.   Viviann Pastor, MD 10/19/23 [phone]

## 2023-10-20 ENCOUNTER — Encounter: Payer: Self-pay | Admitting: Internal Medicine

## 2023-10-20 ENCOUNTER — Encounter: Admitting: Family

## 2023-10-20 ENCOUNTER — Other Ambulatory Visit (HOSPITAL_COMMUNITY): Payer: Self-pay

## 2023-10-20 ENCOUNTER — Telehealth (HOSPITAL_COMMUNITY): Payer: Self-pay | Admitting: Pharmacy Technician

## 2023-10-20 DIAGNOSIS — I5033 Acute on chronic diastolic (congestive) heart failure: Secondary | ICD-10-CM | POA: Diagnosis not present

## 2023-10-20 LAB — MAGNESIUM: Magnesium: 2.4 mg/dL (ref 1.7–2.4)

## 2023-10-20 LAB — IRON AND TIBC
Iron: 28 ug/dL (ref 28–170)
Saturation Ratios: 7 % — ABNORMAL LOW (ref 10.4–31.8)
TIBC: 384 ug/dL (ref 250–450)
UIBC: 356 ug/dL

## 2023-10-20 LAB — CBC
HCT: 31 % — ABNORMAL LOW (ref 36.0–46.0)
Hemoglobin: 8.7 g/dL — ABNORMAL LOW (ref 12.0–15.0)
MCH: 24.7 pg — ABNORMAL LOW (ref 26.0–34.0)
MCHC: 28.1 g/dL — ABNORMAL LOW (ref 30.0–36.0)
MCV: 88.1 fL (ref 80.0–100.0)
Platelets: 282 10*3/uL (ref 150–400)
RBC: 3.52 MIL/uL — ABNORMAL LOW (ref 3.87–5.11)
RDW: 19.9 % — ABNORMAL HIGH (ref 11.5–15.5)
WBC: 5.2 10*3/uL (ref 4.0–10.5)
nRBC: 0 % (ref 0.0–0.2)

## 2023-10-20 LAB — BASIC METABOLIC PANEL WITH GFR
Anion gap: 11 (ref 5–15)
BUN: 35 mg/dL — ABNORMAL HIGH (ref 8–23)
CO2: 28 mmol/L (ref 22–32)
Calcium: 9.2 mg/dL (ref 8.9–10.3)
Chloride: 104 mmol/L (ref 98–111)
Creatinine, Ser: 1.87 mg/dL — ABNORMAL HIGH (ref 0.44–1.00)
GFR, Estimated: 29 mL/min — ABNORMAL LOW (ref >60)
Glucose, Bld: 146 mg/dL — ABNORMAL HIGH (ref 70–99)
Potassium: 4.1 mmol/L (ref 3.5–5.1)
Sodium: 143 mmol/L (ref 135–145)

## 2023-10-20 LAB — HIV ANTIBODY (ROUTINE TESTING W REFLEX): HIV Screen 4th Generation wRfx: NONREACTIVE

## 2023-10-20 LAB — FERRITIN: Ferritin: 33 ng/mL (ref 11–307)

## 2023-10-20 MED ORDER — FUROSEMIDE 40 MG PO TABS
80.0000 mg | ORAL_TABLET | Freq: Once | ORAL | Status: AC
  Administered 2023-10-20: 80 mg via ORAL
  Filled 2023-10-20: qty 2

## 2023-10-20 MED ORDER — ENOXAPARIN SODIUM 30 MG/0.3ML IJ SOSY
30.0000 mg | PREFILLED_SYRINGE | INTRAMUSCULAR | Status: DC
  Administered 2023-10-20 – 2023-10-21 (×2): 30 mg via SUBCUTANEOUS
  Filled 2023-10-20 (×2): qty 0.3

## 2023-10-20 NOTE — Progress Notes (Signed)
 Heart Failure Stewardship Pharmacy Note  PCP: Cleotilde Oneil FALCON, MD PCP-Cardiologist: [name], MD  HPI: PMH of COPD on chronic 2L O2, diastolic CHF, hypertension, dyslipidemia, hypothyroidism and peripheral vascular disease with left subclavian stent placement who presented with worsening dyspnea, orthopnea, and lower extremity edema . On admission, BNP was 851.2, HS-troponin was 8, and iron  studies pending. Chest x-ray noted central vascular congestion with small left effusion.   Pertinent Cardiac History: Echo in 02/2023 showed LVEF of 60-65%, grade II diastolic dysfunction, mild AS.  HS-troponin was 8 on admission. CXR this admission noted interstitial pulmonary edema with small right pleural effusion. After recent admission in November, the patient was taking oral furosemide  and was found to be volume up at her PCP appointment. Torsemide  was started, but patient reports that this did not significantly increase diuresis. Underwent cath on 03/26/23 where a 90% stenosis of mid RCA was identified as the culprit lesion and treated with DES. RHC showed PCWP of 24 and PAP of 49/24 (33), CO/CI of 5.76/3.13, RAP of 14.   Pertinent Lab Values: Creatinine  Date Value Ref Range Status  09/28/2023 1.74 (H) 0.44 - 1.00 mg/dL Final  93/89/7984 9.38 0.60 - 1.30 mg/dL Final   Creatinine, Ser  Date Value Ref Range Status  10/20/2023 1.87 (H) 0.44 - 1.00 mg/dL Final   BUN  Date Value Ref Range Status  10/20/2023 35 (H) 8 - 23 mg/dL Final  94/91/7974 34 (H) 8 - 27 mg/dL Final  93/89/7984 15 7 - 18 mg/dL Final   Potassium  Date Value Ref Range Status  10/20/2023 4.1 3.5 - 5.1 mmol/L Final  09/28/2013 3.9 3.5 - 5.1 mmol/L Final   Sodium  Date Value Ref Range Status  10/20/2023 143 135 - 145 mmol/L Final  08/27/2023 141 134 - 144 mmol/L Final  09/28/2013 133 (L) 136 - 145 mmol/L Final   B Natriuretic Peptide  Date Value Ref Range Status  10/19/2023 851.2 (H) 0.0 - 100.0 pg/mL Final     Comment:    Performed at [facility], [address] [place], [place] [address]   Magnesium   Date Value Ref Range Status  10/20/2023 2.4 1.7 - 2.4 mg/dL Final    Comment:    Performed at [facility], [address] [place], [place] [address]   Hgb A1c MFr Bld  Date Value Ref Range Status  07/15/2022 7.3 (H) 4.8 - 5.6 % Final    Comment:    (NOTE)         Prediabetes: 5.7 - 6.4         Diabetes: >6.4         Glycemic control for adults with diabetes: <7.0    LDH  Date Value Ref Range Status  07/16/2023 103 98 - 192 U/L Final    Comment:    Performed at [facility], [address] [place], [place] [address]    Vital Signs: Temp:  [97.8 F (36.6 C)-98.9 F (37.2 C)] 98.4 F (36.9 C) (07/01 0733) Pulse Rate:  [57-68] 60 (07/01 0733) Cardiac Rhythm: Normal sinus rhythm (06/30 1654) Resp:  [14-24] 18 (07/01 0733) BP: (117-160)/(44-84) 126/50 (07/01 0733) SpO2:  [84 %-100 %] 94 % (07/01 0733) Weight:  [78 kg (172 lb)] 78 kg (172 lb) (06/30 1520) No intake or output data in the 24 hours ending 10/20/23 0827  Current Heart Failure Medications:  Loop diuretic: furosemide  80 mg IV BID Beta-Blocker: none ACEI/ARB/ARNI: none MRA: spironolactone  12.5 mg daily  SGLT2i: Jardiance  10 mg daily Other: none  Prior to admission Heart Failure Medications:  Loop diuretic: torsemide  40 mg daily Beta-Blocker: bisoprolol  5 mg daily ACEI/ARB/ARNI: none MRA: spironolactone  12.5 mg daily SGLT2i: Jardiance  10 mg daily Other: none  Assessment: 1. Acute on chronic diastolic heart failure (LVEF 60-65%) with grade II diastolic dysfunction. NYHA class III symptoms.  -Symptoms: Patient reports feeling much better. Mild LEE still present. Orthopnea improved. Appetite is fair. Denies lethargy.  -Volume: Appears to still be mildly hypervolemic. Creatinine and BUN trending up with diuresis. No weight today. I/Os not charted. Currently on furosemide  80 mg IV BID. Dose  appropriate, can evaluate response tomorrow.  -Hemodynamics: BP is stable. HR 60s.  -SGLT2i: Jardiance  10 mg daily -MRA: spironolactone  12.5 mg daily -ARNI: none  Plan: 1) Medication changes recommended at this time: -None  2) Patient assistance: -Entresto copay is $272.09, Farxiga copay is $272.09, Jardiance  copay is $272.09 Copays so high due to a deductible. will be $47.00 once deductible is met  -Will evaluate eligibility for a grant  3) Education: - Patient has been educated on current HF medications and potential additions to HF medication regimen - Patient verbalizes understanding that over the next few months, these medication doses may change and more medications may be added to optimize HF regimen - Patient has been educated on basic disease state pathophysiology and goals of therapy  Medication Assistance / Insurance Benefits Check: Does the patient have prescription insurance?    Type of insurance plan:  Does the patient qualify for medication assistance through manufacturers or grants? Pending  Outpatient Pharmacy[facility]      Please do not hesitate to reach out with questions or concerns,  Jaun Bash, PharmD, CPP, BCPS Heart Failure Pharmacist  Phone - [phone] 10/20/2023 10:33 AM

## 2023-10-20 NOTE — Progress Notes (Signed)
 PROGRESS NOTE    Misty Adams  FMW:[id number] DOB: 22-Aug-1955 DOA: 10/19/2023 PCP: Cleotilde Oneil FALCON, MD    Brief Narrative:   68 y.o. female with medical history significant for hfpef, cad s/p stent, copd, 2 liters of oxygen  at night, hypothyroid, ckd 3b, dm, pad, iron  deficiency anemia, presenting with the above.   For about the past week has had progressive lower extremity swelling, weight gain, dyspnea, orthopnea, cough. No fever. No chest pain or palpitations. No nausea/vomiting/diarrhea. No sick contacts. No recent med changes. Has doubled up on her torsemide  for several days and symptoms have not improved.    Assessment & Plan:   Principal Problem:   Acute exacerbation of CHF (congestive heart failure) (HCC) Active Problems:   Chronic heart failure with preserved ejection fraction (HFpEF) (HCC)   Hypothyroidism   Chronic obstructive pulmonary disease (HCC)   Dependence on nocturnal oxygen  therapy   Essential hypertension   Chronic kidney disease, stage 3b (HCC)   Pulmonary hypertension, unspecified (HCC)   CAD in native artery   Diabetes mellitus with peripheral vascular disease (HCC)  HFpEF with acute exacerbation TTE 02/2023 showed preserved EF, grade 2 DD, mild mitral stenosis, mild aortic stenosis. Dry weight is around 160, today she is 172, with significant edema, elevated BNP, and vascular congestion on CXR. No chest pain to suggest ischemic process. With worsening oxygen  requirement as well. Given lasix  80 mg IV in the ER. Hemodynamically stable Plan: Continue IV Lasix  currently 80 mg IV twice daily Strict ins and outs Daily weights Defer repeat echocardiogram at this time Continue home Farxiga and Aldactone  Target net -1.5 to 2 L net negative daily  Acute on chronic hypoxic respiratory failure Is on 2 L continuous at baseline.  Requiring 4 L here - [place] O2, wean as able   Coronary artery disease With DES to RCA 12/24. No chest pain Continue aspirin  and  Plavix  Continue statin   COPD Inconsistent with acute exacerbation.  Continue home respiratory   Chronic iron  deficiency anemia Has had extensive w/u within the past year which revealed duodenal ulcer and multiple AVMs on capsule endoscopy. Is followed by hematology and gets serial IV iron . Hgb of 9.3 is at baseline, denies bleeding Plan: PPI   Chronic pain Continue home oxy, gabapentin    T2DM Well controlled Plan: Home Jardiance    GAD Home Xanax  and Effexor    Hypothyroid Continue home Synthroid   CKD 3b Kidney function at baseline Monitor carefully while on diuretic   DVT prophylaxis: Lovenox  Code Status: Full Family Communication: None today Disposition Plan: Status is: Inpatient Remains inpatient appropriate because: Acute decompensated heart failure and IV diuretic   Level of care: Telemetry Cardiac  Consultants:  None  Procedures:  None  Antimicrobials: None   Subjective: Seen and examined.  Resting bed.  Reports shortness of breath and edema is improving.  Objective: Vitals:   10/20/23 0542 10/20/23 0733 10/20/23 1122 10/20/23 1236  BP:  (!) 126/50 122/60 (!) 108/38  Pulse:  60 60 64  Resp:  18 17   Temp: 97.8 F (36.6 C) 98.4 F (36.9 C) 98.1 F (36.7 C) 98.3 F (36.8 C)  TempSrc: Oral Oral Oral   SpO2:  94% 99% 97%  Weight:      Height:       No intake or output data in the 24 hours ending 10/20/23 1439 Filed Weights   10/19/23 1520  Weight: 78 kg    Examination:  General exam: Appears calm and comfortable  Respiratory system: Crackles at bases.  Normal work of breathing.  4 L Cardiovascular system: S1-S2, RRR, no murmurs, 1+ pitting edema BLE Gastrointestinal system: Soft, NT/ND, normal bowel sounds Central nervous system: Alert and oriented. No focal neurological deficits. Extremities: Symmetric 5 x 5 power. Skin: No rashes, lesions or ulcers Psychiatry: Judgement and insight appear normal. Mood & affect appropriate.      Data Reviewed: I have personally reviewed following labs and imaging studies  CBC: Recent Labs  Lab 10/19/23 1523 10/20/23 0541  WBC 7.8 5.2  HGB 9.3* 8.7*  HCT 33.4* 31.0*  MCV 89.1 88.1  PLT 318 282   Basic Metabolic Panel: Recent Labs  Lab 10/19/23 1523 10/20/23 0541  NA 141 143  K 4.4 4.1  CL 105 104  CO2 30 28  GLUCOSE 97 146*  BUN 33* 35*  CREATININE 1.74* 1.87*  CALCIUM  8.9 9.2  MG  --  2.4   GFR: Estimated Creatinine Clearance: 28.3 mL/min (A) (by C-G formula based on SCr of 1.87 mg/dL (H)). Liver Function Tests: No results for input(s): AST, ALT, ALKPHOS, BILITOT, PROT, ALBUMIN  in the last 168 hours. No results for input(s): LIPASE, AMYLASE in the last 168 hours. No results for input(s): AMMONIA in the last 168 hours. Coagulation Profile: No results for input(s): INR, PROTIME in the last 168 hours. Cardiac Enzymes: No results for input(s): CKTOTAL, CKMB, CKMBINDEX, TROPONINI in the last 168 hours. BNP (last 3 results) No results for input(s): PROBNP in the last 8760 hours. HbA1C: No results for input(s): HGBA1C in the last 72 hours. CBG: No results for input(s): GLUCAP in the last 168 hours. Lipid Profile: No results for input(s): CHOL, HDL, LDLCALC, TRIG, CHOLHDL, LDLDIRECT in the last 72 hours. Thyroid  Function Tests: No results for input(s): TSH, T4TOTAL, FREET4, T3FREE, THYROIDAB in the last 72 hours. Anemia Panel: Recent Labs    10/20/23 1247  FERRITIN 33  TIBC 384  IRON  28   Sepsis Labs: No results for input(s): PROCALCITON, LATICACIDVEN in the last 168 hours.  Recent Results (from the past 240 hours)  Resp panel by RT-PCR (RSV, Flu A&B, Covid) Anterior Nasal Swab     Status: None   Collection Time: 10/19/23  4:18 PM   Specimen: Anterior Nasal Swab  Result Value Ref Range Status   SARS Coronavirus 2 by RT PCR NEGATIVE NEGATIVE Final    Comment: (NOTE) SARS-CoV-2  target nucleic acids are NOT DETECTED.  The SARS-CoV-2 RNA is generally detectable in upper respiratory specimens during the acute phase of infection. The lowest concentration of SARS-CoV-2 viral copies this assay can detect is 138 copies/mL. A negative result does not preclude SARS-Cov-2 infection and should not be used as the sole basis for treatment or other patient management decisions. A negative result may occur with  improper specimen collection/handling, submission of specimen other than nasopharyngeal swab, presence of viral mutation(s) within the areas targeted by this assay, and inadequate number of viral copies(<138 copies/mL). A negative result must be combined with clinical observations, patient history, and epidemiological information. The expected result is Negative.  Fact Sheet for Patients:  BloggerCourse.com  Fact Sheet for Healthcare Providers:  SeriousBroker.it  This test is no t yet approved or cleared by the [place]  FDA and  has been authorized for detection and/or diagnosis of SARS-CoV-2 by FDA under an Emergency Use Authorization (EUA). This EUA will remain  in effect (meaning this test can be used) for the duration of the COVID-19 declaration under Section 564(b)(1) of  the Act, 21 U.S.C.section 360bbb-3(b)(1), unless the authorization is terminated  or revoked sooner.       Influenza A by PCR NEGATIVE NEGATIVE Final   Influenza B by PCR NEGATIVE NEGATIVE Final    Comment: (NOTE) The Xpert Xpress SARS-CoV-2/FLU/RSV plus assay is intended as an aid in the diagnosis of influenza from Nasopharyngeal swab specimens and should not be used as a sole basis for treatment. Nasal washings and aspirates are unacceptable for Xpert Xpress SARS-CoV-2/FLU/RSV testing.  Fact Sheet for Patients: BloggerCourse.com  Fact Sheet for Healthcare  Providers: SeriousBroker.it  This test is not yet approved or cleared by the [place]  FDA and has been authorized for detection and/or diagnosis of SARS-CoV-2 by FDA under an Emergency Use Authorization (EUA). This EUA will remain in effect (meaning this test can be used) for the duration of the COVID-19 declaration under Section 564(b)(1) of the Act, 21 U.S.C. section 360bbb-3(b)(1), unless the authorization is terminated or revoked.     Resp Syncytial Virus by PCR NEGATIVE NEGATIVE Final    Comment: (NOTE) Fact Sheet for Patients: BloggerCourse.com  Fact Sheet for Healthcare Providers: SeriousBroker.it  This test is not yet approved or cleared by the [place]  FDA and has been authorized for detection and/or diagnosis of SARS-CoV-2 by FDA under an Emergency Use Authorization (EUA). This EUA will remain in effect (meaning this test can be used) for the duration of the COVID-19 declaration under Section 564(b)(1) of the Act, 21 U.S.C. section 360bbb-3(b)(1), unless the authorization is terminated or revoked.  Performed at [facility], [address], [place], [place] [address]          Radiology Studies: DG Chest Portable 1 View Result Date: 10/19/2023 CLINICAL DATA:  Shortness of breath and cough EXAM: PORTABLE CHEST 1 VIEW COMPARISON:  06/29/2023, chest CT 12/08/2022 FINDINGS: Mild cardiomegaly with central vascular congestion. Possible small left effusion. Aortic atherosclerosis. Stent superior to the aortic arch. IMPRESSION: Mild cardiomegaly with central vascular congestion and possible small left effusion. Electronically Signed   By: Luke Bun M.D.   On: 10/19/2023 16:17        Scheduled Meds:  aspirin  EC  81 mg Oral QHS   atorvastatin   40 mg Oral QHS   budesonide -glycopyrrolate-formoterol   2 puff Inhalation BID   clopidogrel   75 mg Oral Q breakfast    empagliflozin   10 mg Oral Daily   enoxaparin  (LOVENOX ) injection  30 mg Subcutaneous Q24H   furosemide   80 mg Intravenous BID   gabapentin   200 mg Oral QHS   levothyroxine   125 mcg Oral Q0600   pantoprazole   40 mg Oral Daily   potassium chloride  SA  40 mEq Oral BID   spironolactone   12.5 mg Oral Daily   venlafaxine  XR  75 mg Oral Q breakfast   Continuous Infusions:   LOS: 1 day   Calvin KATHEE Robson, MD [facility]   If 7PM-7AM, please contact night-coverage  10/20/2023, 2:39 PM

## 2023-10-20 NOTE — Progress Notes (Signed)
 Heart Failure Navigator Progress Note  Current patient of [facility] @ [facility]. Patient had a scheduled appointment today but had to cancel because of this hospitalization. Detailed education and instructions reviewed on heart failure disease management including the following:  Signs and symptoms of Heart Failure When to call the physician Importance of daily weights Low sodium diet Fluid restriction Medication management Anticipated future follow-up appointments- new follow-up appointment scheduled for patient following this hospitalization. 10/30/23 @ 1:30 PM @ the [facility].  Patient education given on each of the above topics.  Patient acknowledges understanding via teach back method and acceptance of all instructions.  Patient has scale at home: Yes Patient has pill box at home: Yes     Navigator will sign off at this time.  Charmaine Pines, RN, BSN [facility] Heart Failure Navigator Secure Chat Only

## 2023-10-20 NOTE — Telephone Encounter (Signed)
 Patient Product/process development scientist completed.    The patient is insured through [employer]. Patient has Medicare and is not eligible for a copay card, but may be able to apply for patient assistance or Medicare RX Payment Plan (Patient Must reach out to their plan, if eligible for payment plan), if available.    Ran test claim for Entresto 24-26 mg and the current 30 day co-pay is $272.09 due to a deductible.  Will be $47.00 once deductible is met.  Ran test claim for Farxiga 10 mg and the current 30 day co-pay is $272.09 due to a deductible.  Will be $47.00 once deductible is met.  Ran test claim for Jardiance  10 mg and the current 30 day co-pay is $272.09 due to a deductible.  Will be $47.00 once deductible is met.   This test claim was processed through [facility]- copay amounts may vary at other pharmacies due to pharmacy/plan contracts, or as the patient moves through the different stages of their insurance plan.     Reyes Sharps, CPHT Pharmacy Technician III Certified Patient Advocate [facility] Patient Advocate Team Direct Number: [phone]  Fax: [phone]

## 2023-10-20 NOTE — ED Notes (Signed)
Informed RN bed assigned 

## 2023-10-20 NOTE — Plan of Care (Signed)
  Problem: Education: Goal: Knowledge of General Education information will improve Description: Including pain rating scale, medication(s)/side effects and non-pharmacologic comfort measures Outcome: Progressing   Problem: Health Behavior/Discharge Planning: Goal: Ability to manage health-related needs will improve Outcome: Progressing   Problem: Clinical Measurements: Goal: Will remain free from infection Outcome: Progressing   Problem: Elimination: Goal: Will not experience complications related to bowel motility Outcome: Progressing Goal: Will not experience complications related to urinary retention Outcome: Progressing   Problem: Clinical Measurements: Goal: Respiratory complications will improve Outcome: Not Progressing Goal: Cardiovascular complication will be avoided Outcome: Not Progressing   Problem: Activity: Goal: Risk for activity intolerance will decrease Outcome: Not Progressing

## 2023-10-20 NOTE — TOC Initial Note (Signed)
 Transition of Care [facility]) - Initial/Assessment Note    Patient Details  Name: Misty Adams MRN: [id number] Date of Birth: [birth year]  [facility] CM/SW Contact:    Quintella Suzen Jansky, RN Phone Number: 10/20/2023, 12:48 PM  Clinical Narrative:                  Patient lives with family. Has PCP in the community for follow-up at discharge. No discharge needs identified at this by TOC.        Patient Goals and CMS Choice            Expected Discharge Plan and Services                                              Prior Living Arrangements/Services                       Activities of Daily Living      Permission Sought/Granted                  Emotional Assessment              Admission diagnosis:  COPD exacerbation (HCC) [J44.1] Acute exacerbation of CHF (congestive heart failure) (HCC) [I50.9] Acute respiratory failure with hypoxia (HCC) [J96.01] Acute on chronic congestive heart failure, unspecified heart failure type (HCC) [I50.9] Patient Active Problem List   Diagnosis Date Noted   Tobacco abuse 10/19/2023   Acute exacerbation of CHF (congestive heart failure) (HCC) 10/19/2023   Iron  deficiency anemia secondary to blood loss (chronic) 07/07/2023   Hypotension due to medication 07/06/2023   Dependence on nocturnal oxygen  therapy 07/06/2023   Syncope and collapse 07/06/2023   Fall at home, initial encounter 07/06/2023   Demand ischemia (HCC) 03/26/2023   CAD in native artery 03/26/2023   Pulmonary hypertension, unspecified (HCC) 03/25/2023   Acute on chronic respiratory failure with hypoxemia (HCC) 03/24/2023   Chronic kidney disease, stage 3b (HCC) 03/24/2023   CHF (congestive heart failure) (HCC) 03/23/2023   Acute hypokalemia 03/10/2023   Acute respiratory failure with hypoxia (HCC) 03/09/2023   Chronic heart failure with preserved ejection fraction (HFpEF) (HCC) 03/09/2023   Dyslipidemia 03/09/2023    GERD without esophagitis 03/09/2023   Acute kidney injury superimposed on chronic kidney disease (HCC) 03/09/2023   Acute on chronic anemia 12/08/2022   Severe sepsis (HCC) 07/16/2022   Acute pyelonephritis 07/16/2022   Lactic acidosis 07/16/2022   Thrombocytopenia (HCC) 07/16/2022   Essential hypertension 07/16/2022   Hyperlipidemia, unspecified 07/16/2022   Hypothyroidism 07/16/2022   Anxiety and depression 07/16/2022   Acute renal failure with acute tubular necrosis superimposed on stage 3b chronic kidney disease (HCC) 07/14/2022   Chronic obstructive pulmonary disease (HCC) 07/14/2022   Pneumonia 09/21/2020   Pneumonia due to COVID-19 virus 09/20/2020   Chronic venous insufficiency 09/16/2017   Diabetes mellitus with peripheral vascular disease (HCC) 07/21/2017   PAD (peripheral artery disease) (HCC) 07/21/2017   Subclavian artery stenosis, left (HCC) 07/15/2017   PCP:  Cleotilde Oneil FALCON, MD Pharmacy:   [phone] [address] [place] - [address] [place] [address] Phone: [phone] Fax: [phone]  [facility] [address] [place] [address] Phone: [phone] Fax: [phone]     Social Drivers of Health (SDOH) Social History: SDOH  Screenings   Food Insecurity: No Food Insecurity (07/07/2023)  Recent Concern: Food Insecurity - Food Insecurity Present (05/07/2023)   Received from [facility]  Housing: Low Risk  (07/07/2023)  Transportation Needs: No Transportation Needs (07/07/2023)  Utilities: Not At Risk (07/07/2023)  Alcohol Screen: Low Risk  (03/24/2023)  Depression (PHQ2-9): Low Risk  (10/19/2023)  Financial Resource Strain: High Risk (05/07/2023)   Received from [facility]  Social Connections: Moderately Isolated (07/07/2023)  Tobacco Use: High Risk (10/19/2023)   SDOH Interventions:     Readmission Risk Interventions    07/07/2023     8:10 PM 03/23/2023    3:59 PM  Readmission Risk Prevention Plan  Transportation Screening Complete Complete  PCP or Specialist Appt within 3-5 Days Complete Complete  HRI or Home Care Consult Complete Complete  Social Work Consult for Recovery Care Planning/Counseling Complete Complete  Palliative Care Screening Not Applicable Not Applicable  Medication Review Oceanographer) Complete --

## 2023-10-21 ENCOUNTER — Ambulatory Visit: Admission: RE | Admit: 2023-10-21 | Source: Ambulatory Visit

## 2023-10-21 DIAGNOSIS — I5032 Chronic diastolic (congestive) heart failure: Secondary | ICD-10-CM | POA: Diagnosis not present

## 2023-10-21 DIAGNOSIS — I10 Essential (primary) hypertension: Secondary | ICD-10-CM

## 2023-10-21 DIAGNOSIS — E1151 Type 2 diabetes mellitus with diabetic peripheral angiopathy without gangrene: Secondary | ICD-10-CM | POA: Diagnosis not present

## 2023-10-21 DIAGNOSIS — I5033 Acute on chronic diastolic (congestive) heart failure: Secondary | ICD-10-CM | POA: Diagnosis not present

## 2023-10-21 LAB — CBC WITH DIFFERENTIAL/PLATELET
Abs Immature Granulocytes: 0.04 10*3/uL (ref 0.00–0.07)
Basophils Absolute: 0.1 10*3/uL (ref 0.0–0.1)
Basophils Relative: 1 %
Eosinophils Absolute: 0.1 10*3/uL (ref 0.0–0.5)
Eosinophils Relative: 2 %
HCT: 30.5 % — ABNORMAL LOW (ref 36.0–46.0)
Hemoglobin: 8.7 g/dL — ABNORMAL LOW (ref 12.0–15.0)
Immature Granulocytes: 1 %
Lymphocytes Relative: 15 %
Lymphs Abs: 1.3 10*3/uL (ref 0.7–4.0)
MCH: 24.6 pg — ABNORMAL LOW (ref 26.0–34.0)
MCHC: 28.5 g/dL — ABNORMAL LOW (ref 30.0–36.0)
MCV: 86.4 fL (ref 80.0–100.0)
Monocytes Absolute: 0.6 10*3/uL (ref 0.1–1.0)
Monocytes Relative: 7 %
Neutro Abs: 6.5 10*3/uL (ref 1.7–7.7)
Neutrophils Relative %: 74 %
Platelets: 286 10*3/uL (ref 150–400)
RBC: 3.53 MIL/uL — ABNORMAL LOW (ref 3.87–5.11)
RDW: 20.1 % — ABNORMAL HIGH (ref 11.5–15.5)
WBC: 8.6 10*3/uL (ref 4.0–10.5)
nRBC: 0 % (ref 0.0–0.2)

## 2023-10-21 LAB — BASIC METABOLIC PANEL WITH GFR
Anion gap: 8 (ref 5–15)
BUN: 43 mg/dL — ABNORMAL HIGH (ref 8–23)
CO2: 31 mmol/L (ref 22–32)
Calcium: 8.6 mg/dL — ABNORMAL LOW (ref 8.9–10.3)
Chloride: 102 mmol/L (ref 98–111)
Creatinine, Ser: 1.98 mg/dL — ABNORMAL HIGH (ref 0.44–1.00)
GFR, Estimated: 27 mL/min — ABNORMAL LOW (ref >60)
Glucose, Bld: 116 mg/dL — ABNORMAL HIGH (ref 70–99)
Potassium: 4.2 mmol/L (ref 3.5–5.1)
Sodium: 141 mmol/L (ref 135–145)

## 2023-10-21 LAB — MAGNESIUM: Magnesium: 2.6 mg/dL — ABNORMAL HIGH (ref 1.7–2.4)

## 2023-10-21 MED ORDER — OXYCODONE-ACETAMINOPHEN 5-325 MG PO TABS: 1.0000 | ORAL_TABLET | ORAL | Status: DC | PRN

## 2023-10-21 NOTE — Progress Notes (Signed)
 Heart Failure Stewardship Pharmacy Note  PCP: Cleotilde Oneil FALCON, MD PCP-Cardiologist: [name], MD  HPI: PMH of COPD on chronic 2L O2, diastolic CHF, hypertension, dyslipidemia, hypothyroidism and peripheral vascular disease with left subclavian stent placement who presented with worsening dyspnea, orthopnea, and lower extremity edema . On admission, BNP was 851.2, HS-troponin was 8, and iron  studies pending. Chest x-ray noted central vascular congestion with small left effusion.   Pertinent Cardiac History: Echo in 02/2023 showed LVEF of 60-65%, grade II diastolic dysfunction, mild AS.  HS-troponin was 8 on admission. CXR this admission noted interstitial pulmonary edema with small right pleural effusion. After recent admission in November, the patient was taking oral furosemide  and was found to be volume up at her PCP appointment. Torsemide  was started, but patient reports that this did not significantly increase diuresis. Underwent cath on 03/26/23 where a 90% stenosis of mid RCA was identified as the culprit lesion and treated with DES. RHC showed PCWP of 24 and PAP of 49/24 (33), CO/CI of 5.76/3.13, RAP of 14.   Pertinent Lab Values: Creatinine  Date Value Ref Range Status  09/28/2023 1.74 (H) 0.44 - 1.00 mg/dL Final  93/89/7984 9.38 0.60 - 1.30 mg/dL Final   Creatinine, Ser  Date Value Ref Range Status  10/21/2023 1.98 (H) 0.44 - 1.00 mg/dL Final   BUN  Date Value Ref Range Status  10/21/2023 43 (H) 8 - 23 mg/dL Final  94/91/7974 34 (H) 8 - 27 mg/dL Final  93/89/7984 15 7 - 18 mg/dL Final   Potassium  Date Value Ref Range Status  10/21/2023 4.2 3.5 - 5.1 mmol/L Final  09/28/2013 3.9 3.5 - 5.1 mmol/L Final   Sodium  Date Value Ref Range Status  10/21/2023 141 135 - 145 mmol/L Final  08/27/2023 141 134 - 144 mmol/L Final  09/28/2013 133 (L) 136 - 145 mmol/L Final   B Natriuretic Peptide  Date Value Ref Range Status  10/19/2023 851.2 (H) 0.0 - 100.0 pg/mL Final     Comment:    Performed at [facility], [address] [place], [place] [address]   Magnesium   Date Value Ref Range Status  10/21/2023 2.6 (H) 1.7 - 2.4 mg/dL Final    Comment:    Performed at [facility], [address] [place], [place] [address]   Hgb A1c MFr Bld  Date Value Ref Range Status  07/15/2022 7.3 (H) 4.8 - 5.6 % Final    Comment:    (NOTE)         Prediabetes: 5.7 - 6.4         Diabetes: >6.4         Glycemic control for adults with diabetes: <7.0    LDH  Date Value Ref Range Status  07/16/2023 103 98 - 192 U/L Final    Comment:    Performed at [facility], [address] [place], [place] [address]    Vital Signs: Temp:  [97.8 F (36.6 C)-98.5 F (36.9 C)] 98.5 F (36.9 C) (07/02 0748) Pulse Rate:  [58-68] 58 (07/02 0748) Cardiac Rhythm: Normal sinus rhythm (07/01 1900) Resp:  [17-20] 20 (07/02 0503) BP: (108-148)/(38-72) 135/42 (07/02 0748) SpO2:  [97 %-99 %] 98 % (07/02 0748) Weight:  [76 kg (167 lb 8 oz)] 76 kg (167 lb 8 oz) (07/02 0509)  Intake/Output Summary (Last 24 hours) at 10/21/2023 0937 Last data filed at 10/21/2023 0700 Gross per 24 hour  Intake 240 ml  Output 1800 ml  Net -  1560 ml    Current Heart Failure Medications:  Loop diuretic: furosemide  80 mg IV BID Beta-Blocker: none ACEI/ARB/ARNI: none MRA: spironolactone  12.5 mg daily SGLT2i: Jardiance  10 mg daily Other: none  Prior to admission Heart Failure Medications:  Loop diuretic: torsemide  40 mg daily Beta-Blocker: bisoprolol  5 mg daily ACEI/ARB/ARNI: none MRA: spironolactone  12.5 mg daily SGLT2i: Jardiance  10 mg daily Other: none  Assessment: 1. Acute on chronic diastolic heart failure (LVEF 60-65%) with grade II diastolic dysfunction. NYHA class III symptoms.  -Symptoms: Patient reports feeling much better. Mild LEE still present, though much improved. Orthopnea improved. Appetite is fair. Denies lethargy. Only able to ambulate to the door before  becoming short of breath.  -Volume: Appears to still be relatively euvolemic. Creatinine and BUN trending up with diuresis. Weight is down 5 lbs. Currently on furosemide  80 mg IV BID. With uptrending creatinine, can consider changing furosemide  to oral torsemide .  -Hemodynamics: BP is stable. HR 60s.  -SGLT2i: Jardiance  10 mg daily -MRA: spironolactone  12.5 mg daily -ARNI: BP is elevated. May benefit from ARNI or ARB once creatinine improves.  Plan: 1) Medication changes recommended at this time: -Can consider de-escalation of diuretics to oral torsemide  40 mg daily   2) Patient assistance: -Entresto copay is $272.09, Doreen copay is $272.09, Jardiance  copay is $272.09 Copays so high due to a deductible. will be $47.00 once deductible is met  -Will evaluate eligibility for a grant  3) Education: - Patient has been educated on current HF medications and potential additions to HF medication regimen - Patient verbalizes understanding that over the next few months, these medication doses may change and more medications may be added to optimize HF regimen - Patient has been educated on basic disease state pathophysiology and goals of therapy  Medication Assistance / Insurance Benefits Check: Does the patient have prescription insurance?    Type of insurance plan:  Does the patient qualify for medication assistance through manufacturers or grants? Pending  Outpatient Pharmacy[facility]      Please do not hesitate to reach out with questions or concerns,  Jaun Bash, PharmD, CPP, BCPS Heart Failure Pharmacist  Phone - [phone] 10/21/2023 9:37 AM

## 2023-10-21 NOTE — Progress Notes (Signed)
 PROGRESS NOTE    Misty Adams  FMW:[id number] DOB: 1956-02-08 DOA: 10/19/2023 PCP: Cleotilde Oneil FALCON, MD    Brief Narrative:   68 y.o. female with medical history significant for hfpef, cad s/p stent, copd, 2 liters of oxygen  at night, hypothyroid, ckd 3b, dm, pad, iron  deficiency anemia, presenting with the above.   For about the past week has had progressive lower extremity swelling, weight gain, dyspnea, orthopnea, cough. No fever. No chest pain or palpitations. No nausea/vomiting/diarrhea. No sick contacts. No recent med changes. Has doubled up on her torsemide  for several days and symptoms have not improved.   7/2: Weaning oxygen  off to 2 L.  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 Dry weight is around 160, today she is 172, with significant edema, elevated BNP, and vascular congestion on CXR. No chest pain to suggest ischemic process. With worsening oxygen  requirement as well. Given lasix  80 mg IV in the ER. Hemodynamically stable Plan: Continue IV Lasix  currently 80 mg IV twice daily.  Likely transition to oral torsemide  at discharge tomorrow Strict ins and outs Daily weights Defer repeat echocardiogram at this time Continue home Farxiga and Aldactone  Net IO Since Admission: -2,410 mL [10/21/23 1856]   Acute on chronic hypoxic respiratory failure Is on 2 L continuous at baseline.  Requiring 4 L  here.  Wean oxygen  as able to 2 L possible discharge tomorrow - [place] O2, wean as able   Coronary artery disease With DES to RCA 12/24. No chest pain Continue aspirin  and Plavix  Continue statin   COPD Inconsistent with acute exacerbation.  Continue home respiratory   Chronic iron  deficiency anemia Has had extensive w/u within the past year which revealed duodenal ulcer and multiple AVMs on capsule endoscopy. Is followed by hematology and gets serial IV iron . Hgb of 9.3 is at baseline, denies bleeding Plan: PPI   Chronic pain Continue home oxy, gabapentin    T2DM Well controlled Plan: Home Jardiance    GAD Home Xanax  and Effexor    Hypothyroid Continue home Synthroid   CKD 3b Kidney function somewhat worsened today.  Will recheck in the morning Monitor carefully while on diuretic   DVT prophylaxis: Lovenox  Code Status: Full Family Communication: None at bedside Disposition Plan: Status is: Inpatient Remains inpatient appropriate because: Acute decompensated heart failure and IV diuretic.  Hopeful for discharge tomorrow   Level of care: Telemetry Cardiac    Subjective:  Would like to be weighed today.  Still requiring 4 L oxygen  and hopeful to get down to 2 L before she could go home.  She is hopeful to leave tomorrow morning as her daughter gets to work at 1 PM  Objective: Vitals:   10/21/23 0509 10/21/23 0748 10/21/23 1116 10/21/23 1713  BP:  (!) 135/42 (!) 140/48 (!) 134/50  Pulse:  (!) 58 61 60  Resp:   18 18  Temp:  98.5 F (36.9 C) 98.2 F (36.8 C) 98.3  F (36.8 C)  TempSrc:  Oral    SpO2:  98% 98% 95%  Weight: 76 kg     Height:        Intake/Output Summary (Last 24 hours) at 10/21/2023 1852 Last data filed at 10/21/2023 1800 Gross per 24 hour  Intake 1090 ml  Output 3500 ml  Net -2410 ml   Filed Weights   10/19/23 1520 10/21/23 0509  Weight: 78 kg 76 kg    Examination:  General exam: Appears calm and comfortable  Respiratory system: Crackles  at bases.  Normal work of breathing.  4 L Cardiovascular system: S1-S2, RRR, no murmurs, 1+ pitting edema BLE Gastrointestinal system: Soft, NT/ND, normal bowel sounds Central nervous system: Alert and oriented. No focal neurological deficits. Extremities: Symmetric 5 x 5 power. Skin: No rashes, lesions or ulcers Psychiatry: Judgement and insight appear normal. Mood & affect appropriate.     Data Reviewed: I have personally reviewed following labs and imaging studies  CBC: Recent Labs  Lab 10/19/23 1523 10/20/23 0541 10/21/23 0339  WBC 7.8 5.2 8.6  NEUTROABS  --   --  6.5  HGB 9.3* 8.7* 8.7*  HCT 33.4* 31.0* 30.5*  MCV 89.1 88.1 86.4  PLT 318 282 286   Basic Metabolic Panel: Recent Labs  Lab 10/19/23 1523 10/20/23 0541 10/21/23 0339  NA 141 143 141  K 4.4 4.1 4.2  CL 105 104 102  CO2 30 28 31   GLUCOSE 97 146* 116*  BUN 33* 35* 43*  CREATININE 1.74* 1.87* 1.98*  CALCIUM  8.9 9.2 8.6*  MG  --  2.4 2.6*     Anemia Panel: Recent Labs    10/20/23 1247  FERRITIN 33  TIBC 384  IRON  28   Sepsis Labs: No results for input(s): PROCALCITON, LATICACIDVEN in the last 168 hours.  Recent Results (from the past 240 hours)  Resp panel by RT-PCR (RSV, Flu A&B, Covid) Anterior Nasal Swab     Status: None   Collection Time: 10/19/23  4:18 PM   Specimen: Anterior Nasal Swab  Result Value Ref Range Status   SARS Coronavirus 2 by RT PCR NEGATIVE NEGATIVE Final    Comment: (NOTE) SARS-CoV-2 target nucleic acids are NOT DETECTED.  The SARS-CoV-2 RNA is generally detectable in upper respiratory specimens during the acute phase of infection. The lowest concentration of SARS-CoV-2 viral copies this assay can detect is 138 copies/mL. A negative result does not preclude SARS-Cov-2 infection and should not be used as the sole basis for treatment or other patient management decisions. A negative result may occur with  improper specimen collection/handling, submission of  specimen other than nasopharyngeal swab, presence of viral mutation(s) within the areas targeted by this assay, and inadequate number of viral copies(<138 copies/mL). A negative result must be combined with clinical observations, patient history, and epidemiological information. The expected result is Negative.  Fact Sheet for Patients:  BloggerCourse.com  Fact Sheet for Healthcare Providers:  SeriousBroker.it  This test is no t yet approved or cleared by the [place]  FDA and  has been authorized for detection and/or diagnosis of SARS-CoV-2 by FDA under an Emergency Use Authorization (EUA). This EUA will remain  in effect (meaning this test can be used) for the duration of the COVID-19 declaration under Section 564(b)(1) of the Act, 21 U.S.C.section 360bbb-3(b)(1), unless the authorization is terminated  or revoked sooner.       Influenza A by PCR NEGATIVE NEGATIVE Final   Influenza B by PCR NEGATIVE NEGATIVE Final  Comment: (NOTE) The Xpert Xpress SARS-CoV-2/FLU/RSV plus assay is intended as an aid in the diagnosis of influenza from Nasopharyngeal swab specimens and should not be used as a sole basis for treatment. Nasal washings and aspirates are unacceptable for Xpert Xpress SARS-CoV-2/FLU/RSV testing.  Fact Sheet for Patients: BloggerCourse.com  Fact Sheet for Healthcare Providers: SeriousBroker.it  This test is not yet approved or cleared by the [place]  FDA and has been authorized for detection and/or diagnosis of SARS-CoV-2 by FDA under an Emergency Use Authorization (EUA). This EUA will remain in effect (meaning this test can be used) for the duration of the COVID-19 declaration under Section 564(b)(1) of the Act, 21 U.S.C. section 360bbb-3(b)(1), unless the authorization is terminated or revoked.     Resp Syncytial Virus by PCR NEGATIVE NEGATIVE Final     Comment: (NOTE) Fact Sheet for Patients: BloggerCourse.com  Fact Sheet for Healthcare Providers: SeriousBroker.it  This test is not yet approved or cleared by the [place]  FDA and has been authorized for detection and/or diagnosis of SARS-CoV-2 by FDA under an Emergency Use Authorization (EUA). This EUA will remain in effect (meaning this test can be used) for the duration of the COVID-19 declaration under Section 564(b)(1) of the Act, 21 U.S.C. section 360bbb-3(b)(1), unless the authorization is terminated or revoked.  Performed at [facility], [address], [place], [place] [address]          Radiology Studies: No results found.       Scheduled Meds:  aspirin  EC  81 mg Oral QHS   atorvastatin   40 mg Oral QHS   budesonide -glycopyrrolate-formoterol   2 puff Inhalation BID   clopidogrel   75 mg Oral Q breakfast   empagliflozin   10 mg Oral Daily   enoxaparin  (LOVENOX ) injection  30 mg Subcutaneous Q24H   furosemide   80 mg Intravenous BID   gabapentin   200 mg Oral QHS   levothyroxine   125 mcg Oral Q0600   pantoprazole   40 mg Oral Daily   potassium chloride  SA  40 mEq Oral BID   spironolactone   12.5 mg Oral Daily   venlafaxine  XR  75 mg Oral Q breakfast   Time Spent: 35 mins   LOS: 2 days   Cresencio Fairly, MD [facility]   If 7PM-7AM, please contact night-coverage  10/21/2023, 6:52 PM

## 2023-10-21 NOTE — Plan of Care (Signed)
°  Problem: Education: Goal: Knowledge of General Education information will improve Description: Including pain rating scale, medication(s)/side effects and non-pharmacologic comfort measures Outcome: Progressing   Problem: Clinical Measurements: Goal: Ability to maintain clinical measurements within normal limits will improve Outcome: Progressing Goal: Diagnostic test results will improve Outcome: Progressing Goal: Respiratory complications will improve Outcome: Progressing Goal: Cardiovascular complication will be avoided Outcome: Progressing   Problem: Activity: Goal: Risk for activity intolerance will decrease Outcome: Progressing   Problem: Coping: Goal: Level of anxiety will decrease Outcome: Progressing   Problem: Safety: Goal: Ability to remain free from injury will improve Outcome: Progressing

## 2023-10-21 NOTE — TOC Initial Note (Signed)
 Transition of Care [facility]) - Initial/Assessment Note    Patient Details  Name: Misty Adams MRN: [id number] Date of Birth: 03-May-1955  Transition of [facility]) CM/SW Contact:    [name], LCSW Phone Number: 10/21/2023, 12:06 PM  Clinical Narrative: Readmission prevention screen complete. CSW met with patient. Daughter at bedside. CSW introduced role and explained that discharge planning would be discussed. PCP is Oneil Pinal, MD. Daughter drives her to her appointments. [facility] [address] No issues affording her medications. Patient lives home with her children. No home health prior to admission. She has a RW, cane, rollator, and shower chair at home. She was using the rollator more frequently prior to admission. She uses 2 L of oxygen  at home through [employer]. No further concerns. CSW will continue to follow patient for support and facilitate return home once stable. One of her children will transport her home at discharge.                 Expected Discharge Plan: Home/Self Care Barriers to Discharge: Continued Medical Work up   Patient Goals and CMS Choice            Expected Discharge Plan and Services     Post Acute Care Choice: NA Living arrangements for the past 2 months: Single Family Home                                      Prior Living Arrangements/Services Living arrangements for the past 2 months: Single Family Home Lives with:: Adult Children Patient language and need for interpreter reviewed:: Yes Do you feel safe going back to the place where you live?: Yes      Need for Family Participation in Patient Care: Yes (Comment) Care giver support system in place?: Yes (comment) Current home services: DME Criminal Activity/Legal Involvement Pertinent to Current Situation/Hospitalization: No - Comment as needed  Activities of Daily Living   ADL Screening (condition at time of admission) Independently performs ADLs?: No Does the  patient have a NEW difficulty with bathing/dressing/toileting/self-feeding that is expected to last >3 days?: Yes (Initiates electronic notice to provider for possible OT consult) Does the patient have a NEW difficulty with getting in/out of bed, walking, or climbing stairs that is expected to last >3 days?: Yes (Initiates electronic notice to provider for possible PT consult) Does the patient have a NEW difficulty with communication that is expected to last >3 days?: No Is the patient deaf or have difficulty hearing?: No Does the patient have difficulty seeing, even when wearing glasses/contacts?: No Does the patient have difficulty concentrating, remembering, or making decisions?: No  Permission Sought/Granted Permission sought to share information with : Family Supports Permission granted to share information with : Yes, Verbal Permission Granted  Share Information with NAME: Alexyia Guarino     Permission granted to share info w Relationship: Daughter  Permission granted to share info w Contact Information: [phone]  Emotional Assessment Appearance:: Appears stated age Attitude/Demeanor/Rapport: Engaged, Gracious Affect (typically observed): Accepting, Appropriate, Calm, Pleasant Orientation: : Oriented to Self, Oriented to Place, Oriented to  Time, Oriented to Situation Alcohol / Substance Use: Not Applicable Psych Involvement: No (comment)  Admission diagnosis:  COPD exacerbation (HCC) [J44.1] Acute exacerbation of CHF (congestive heart failure) (HCC) [I50.9] Acute respiratory failure with hypoxia (HCC) [J96.01] Acute on chronic congestive heart failure, unspecified heart failure type (HCC) [I50.9] Patient Active  Problem List   Diagnosis Date Noted   Tobacco abuse 10/19/2023   Acute exacerbation of CHF (congestive heart failure) (HCC) 10/19/2023   Iron  deficiency anemia secondary to blood loss (chronic) 07/07/2023   Hypotension due to medication 07/06/2023   Dependence on  nocturnal oxygen  therapy 07/06/2023   Syncope and collapse 07/06/2023   Fall at home, initial encounter 07/06/2023   Demand ischemia (HCC) 03/26/2023   CAD in native artery 03/26/2023   Pulmonary hypertension, unspecified (HCC) 03/25/2023   Acute on chronic respiratory failure with hypoxemia (HCC) 03/24/2023   Chronic kidney disease, stage 3b (HCC) 03/24/2023   CHF (congestive heart failure) (HCC) 03/23/2023   Acute hypokalemia 03/10/2023   Acute respiratory failure with hypoxia (HCC) 03/09/2023   Chronic heart failure with preserved ejection fraction (HFpEF) (HCC) 03/09/2023   Dyslipidemia 03/09/2023   GERD without esophagitis 03/09/2023   Acute kidney injury superimposed on chronic kidney disease (HCC) 03/09/2023   Acute on chronic anemia 12/08/2022   Severe sepsis (HCC) 07/16/2022   Acute pyelonephritis 07/16/2022   Lactic acidosis 07/16/2022   Thrombocytopenia (HCC) 07/16/2022   Essential hypertension 07/16/2022   Hyperlipidemia, unspecified 07/16/2022   Hypothyroidism 07/16/2022   Anxiety and depression 07/16/2022   Acute renal failure with acute tubular necrosis superimposed on stage 3b chronic kidney disease (HCC) 07/14/2022   Chronic obstructive pulmonary disease (HCC) 07/14/2022   Pneumonia 09/21/2020   Pneumonia due to COVID-19 virus 09/20/2020   Chronic venous insufficiency 09/16/2017   Diabetes mellitus with peripheral vascular disease (HCC) 07/21/2017   PAD (peripheral artery disease) (HCC) 07/21/2017   Subclavian artery stenosis, left (HCC) 07/15/2017   PCP:  Cleotilde Oneil FALCON, MD [phone] [address] [place] - [address] [place] [address] Phone: [phone] Fax: [phone]  [facility] [address] [place] [address] Phone: [phone] Fax: [phone]     Social Drivers of Health (SDOH) Social History: SDOH Screenings   Food Insecurity: No Food  Insecurity (10/20/2023)  Housing: Low Risk  (10/20/2023)  Transportation Needs: No Transportation Needs (10/20/2023)  Utilities: Not At Risk (10/20/2023)  Alcohol Screen: Low Risk  (03/24/2023)  Depression (PHQ2-9): Low Risk  (10/19/2023)  Financial Resource Strain: High Risk (05/07/2023)   Received from [facility]  Social Connections: Moderately Isolated (10/20/2023)  Tobacco Use: High Risk (10/19/2023)   SDOH Interventions:     Readmission Risk Interventions    10/21/2023   12:03 PM 07/07/2023    8:10 PM 03/23/2023    3:59 PM  Readmission Risk Prevention Plan  Transportation Screening Complete Complete Complete  PCP or Specialist Appt within 3-5 Days Complete Complete Complete  HRI or Home Care Consult  Complete Complete  Social Work Consult for Recovery Care Planning/Counseling Complete Complete Complete  Palliative Care Screening Not Applicable Not Applicable Not Applicable  Medication Review Oceanographer) Complete Complete --

## 2023-10-21 NOTE — Plan of Care (Signed)

## 2023-10-21 NOTE — Care Management Important Message (Signed)
 Important Message  Patient Details  Name: Misty Adams MRN: [id number] Date of Birth: 10-05-1955   Important Message Given:  Yes - Medicare IM     [name] 10/21/2023, 1:12 PM

## 2023-10-21 NOTE — Progress Notes (Signed)
 OT Cancellation Note  Patient Details Name: Misty Adams MRN: [id number] DOB: 1956-01-31   Cancelled Treatment:    Reason Eval/Treat Not Completed: OT screened, no needs identified, will sign off. Order received, chart reviewed. Per PT, pt has no acute OT needs and has been up ambulating and performing ADLs independently. Will sign off in house.   Joud Pettinato E Aniello Christopoulos 10/21/2023, 10:09 AM

## 2023-10-21 NOTE — Progress Notes (Signed)
 PT Cancellation Note  Patient Details Name: Misty Adams MRN: [id number] DOB: 1955-05-13   Cancelled Treatment:    Reason Eval/Treat Not Completed: Other (comment). Consult received and chart reviewed. Pt in bathroom upon arrival. When done toileting, pt ambulated indep in room, currently on 4L (2L chronic). O2 sats at 92% post ambulation. Discussed mobility and needs for therapy involvement. Pt with level entry home, lives with her children and feels confident in her mobility at this time. No acute PT needs at this time. Will dc in house. Please re-order if needs change.   Liz Pinho 10/21/2023, 9:39 AM Corean Dade, PT, DPT, GCS [phone]

## 2023-10-22 ENCOUNTER — Encounter: Payer: Self-pay | Admitting: Internal Medicine

## 2023-10-22 ENCOUNTER — Ambulatory Visit: Admitting: Dermatology

## 2023-10-22 ENCOUNTER — Other Ambulatory Visit: Payer: Self-pay

## 2023-10-22 DIAGNOSIS — E1151 Type 2 diabetes mellitus with diabetic peripheral angiopathy without gangrene: Secondary | ICD-10-CM | POA: Diagnosis not present

## 2023-10-22 DIAGNOSIS — I5033 Acute on chronic diastolic (congestive) heart failure: Secondary | ICD-10-CM | POA: Diagnosis not present

## 2023-10-22 DIAGNOSIS — J432 Centrilobular emphysema: Secondary | ICD-10-CM

## 2023-10-22 DIAGNOSIS — I5032 Chronic diastolic (congestive) heart failure: Secondary | ICD-10-CM | POA: Diagnosis not present

## 2023-10-22 LAB — MAGNESIUM: Magnesium: 2.6 mg/dL — ABNORMAL HIGH (ref 1.7–2.4)

## 2023-10-22 LAB — BASIC METABOLIC PANEL WITH GFR
Anion gap: 10 (ref 5–15)
BUN: 46 mg/dL — ABNORMAL HIGH (ref 8–23)
CO2: 32 mmol/L (ref 22–32)
Calcium: 8.9 mg/dL (ref 8.9–10.3)
Chloride: 98 mmol/L (ref 98–111)
Creatinine, Ser: 1.72 mg/dL — ABNORMAL HIGH (ref 0.44–1.00)
GFR, Estimated: 32 mL/min — ABNORMAL LOW (ref >60)
Glucose, Bld: 92 mg/dL (ref 70–99)
Potassium: 4.7 mmol/L (ref 3.5–5.1)
Sodium: 140 mmol/L (ref 135–145)

## 2023-10-22 LAB — CBC
HCT: 31.9 % — ABNORMAL LOW (ref 36.0–46.0)
Hemoglobin: 8.9 g/dL — ABNORMAL LOW (ref 12.0–15.0)
MCH: 24.1 pg — ABNORMAL LOW (ref 26.0–34.0)
MCHC: 27.9 g/dL — ABNORMAL LOW (ref 30.0–36.0)
MCV: 86.4 fL (ref 80.0–100.0)
Platelets: 276 10*3/uL (ref 150–400)
RBC: 3.69 MIL/uL — ABNORMAL LOW (ref 3.87–5.11)
RDW: 19.9 % — ABNORMAL HIGH (ref 11.5–15.5)
WBC: 5.4 10*3/uL (ref 4.0–10.5)
nRBC: 0 % (ref 0.0–0.2)

## 2023-10-22 MED ORDER — TORSEMIDE 20 MG PO TABS
40.0000 mg | ORAL_TABLET | Freq: Every day | ORAL | 0 refills | Status: DC
  Filled 2023-10-22: qty 60, 30d supply, fill #0

## 2023-10-22 MED ORDER — SPIRONOLACTONE 25 MG PO TABS
25.0000 mg | ORAL_TABLET | Freq: Every day | ORAL | 0 refills | Status: AC
  Filled 2023-10-22 (×2): qty 30, 30d supply, fill #0

## 2023-10-22 MED ORDER — SPIRONOLACTONE 25 MG PO TABS
12.5000 mg | ORAL_TABLET | Freq: Every day | ORAL | 0 refills | Status: DC
  Filled 2023-10-22 (×2): qty 30, 60d supply, fill #0

## 2023-10-22 MED ORDER — POTASSIUM CHLORIDE CRYS ER 20 MEQ PO TBCR
40.0000 meq | EXTENDED_RELEASE_TABLET | Freq: Every day | ORAL | 0 refills | Status: DC
  Filled 2023-10-22: qty 30, 15d supply, fill #0

## 2023-10-22 NOTE — TOC Transition Note (Signed)
 Transition of Care [facility]) - Discharge Note   Patient Details  Name: Misty Adams MRN: [id number] Date of Birth: 1955/12/20  [facility]) CM/SW Contact:  [name], LCSW Phone Number: 10/22/2023, 11:05 AM   Clinical Narrative: Patient has orders to discharge home today. No further concerns. CSW signing off.    Final next level of care: Home/Self Care Barriers to Discharge: Barriers Resolved   Patient Goals and CMS Choice            Discharge Placement                Patient to be transferred to facility by: One of her children   Patient and family notified of of transfer: 10/22/23  Discharge Plan and Services Additional resources added to the After Visit Summary for       Post Acute Care Choice: NA                               Social Drivers of Health (SDOH) Interventions SDOH Screenings   Food Insecurity: No Food Insecurity (10/20/2023)  Housing: Low Risk  (10/20/2023)  Transportation Needs: No Transportation Needs (10/20/2023)  Utilities: Not At Risk (10/20/2023)  Alcohol Screen: Low Risk  (03/24/2023)  Depression (PHQ2-9): Low Risk  (10/19/2023)  Financial Resource Strain: High Risk (05/07/2023)   Received from [facility]  Social Connections: Moderately Isolated (10/20/2023)  Tobacco Use: High Risk (10/19/2023)     Readmission Risk Interventions    10/21/2023   12:03 PM 07/07/2023    8:10 PM 03/23/2023    3:59 PM  Readmission Risk Prevention Plan  Transportation Screening Complete Complete Complete  PCP or Specialist Appt within 3-5 Days Complete Complete Complete  HRI or Home Care Consult  Complete Complete  Social Work Consult for Recovery Care Planning/Counseling Complete Complete Complete  Palliative Care Screening Not Applicable Not Applicable Not Applicable  Medication Review Oceanographer) Complete Complete --

## 2023-10-22 NOTE — Discharge Summary (Signed)
 Physician Discharge Summary   Patient: Misty Adams MRN: [id number] DOB: Feb 09, 1956  Admit date:     10/19/2023  Discharge date: 10/22/23  Discharge Physician: Cresencio Fairly   PCP: Cleotilde Oneil FALCON, MD   Recommendations at discharge:    F/up with outpt providers as requested  Discharge Diagnoses: Principal Problem:   Acute exacerbation of CHF (congestive heart failure) (HCC) Active Problems:   Chronic heart failure with preserved ejection fraction (HFpEF) (HCC)   Hypothyroidism   Chronic obstructive pulmonary disease (HCC)   Dependence on nocturnal oxygen  therapy   Essential hypertension   Chronic kidney disease, stage 3b (HCC)   Pulmonary hypertension, unspecified (HCC)   CAD in native artery   Diabetes mellitus with peripheral vascular disease [facility])  Hospital Course: Assessment and Plan:  68 y.o. female with medical history significant for hfpef, cad s/p stent, copd, 2 liters of oxygen  at night, hypothyroid, ckd 3b, dm, pad, iron  deficiency anemia, presenting with the above.   For about the past week has had progressive lower extremity swelling, weight gain, dyspnea, orthopnea, cough. No fever. No chest pain or palpitations. No nausea/vomiting/diarrhea. No sick contacts. No recent med changes. Has doubled up on her torsemide  for several days and symptoms have not improved.    7/2: Weaning oxygen  off to 2 L.     HFpEF with acute exacerbation TTE 02/2023 showed preserved EF, grade 2 DD, mild mitral stenosis, mild aortic stenosis. Dry weight is around 160, today she is 172, with significant edema, elevated BNP, and vascular congestion on CXR. No chest pain to suggest ischemic process. With worsening oxygen  requirement as well. Given lasix  80 mg IV in the ER. Hemodynamically stable Diuresed with Lasix  Continue home Farxiga and Aldactone  Net IO Since Admission: -2,410 mL [10/21/23 1856]    Acute on chronic hypoxic respiratory failure Is on 2 L continuous at baseline.      Coronary artery disease With DES to RCA 12/24. No chest pain Continue aspirin  and Plavix  Continue statin   COPD stable   Chronic iron  deficiency anemia Has had extensive w/u within the past year which revealed duodenal ulcer and multiple AVMs on capsule endoscopy. Is followed by hematology and gets serial IV iron . Hgb of 9.3 is at baseline, denies bleeding PPI   Chronic pain Continue home oxy, gabapentin    T2DM   GAD Home Xanax  and Effexor    Hypothyroid Continue home Synthroid    CKD 3b Kidney function at baseline       Disposition: Home Diet recommendation:  Discharge Diet Orders (From admission, onward)     Start     Ordered   10/22/23 0000  Diet - low sodium heart healthy        10/22/23 1020           Carb modified diet DISCHARGE MEDICATION: Allergies as of 10/22/2023       Reactions   Hydrocodone -acetaminophen  Hives   Vicodin [hydrocodone -acetaminophen ] Hives   Trelegy Ellipta [fluticasone-umeclidin-vilant] Rash   Changed to Budeson-Glycopyrrol-Formoterol  by provider        Medication List     STOP taking these medications    acetaminophen  500 MG tablet Commonly known as: TYLENOL    ondansetron  4 MG tablet Commonly known as: ZOFRAN        TAKE these medications    Accu-Chek Guide Test test strip Generic drug: glucose blood   Accu-Chek Softclix Lancets lancets SMARTSIG:Lancet Topical   albuterol  108 (90 Base) MCG/ACT inhaler Commonly known as: VENTOLIN  HFA Inhale 2 puffs into  the lungs every 6 (six) hours as needed for wheezing or shortness of breath.   ALPRAZolam  0.5 MG tablet Commonly known as: XANAX  Take 0.5 mg by mouth 2 (two) times daily as needed for anxiety.   aspirin  81 MG tablet Take 81 mg by mouth at bedtime.   atorvastatin  40 MG tablet Commonly known as: Lipitor Take 1 tablet (40 mg total) by mouth at bedtime.   bisoprolol  5 MG tablet Commonly known as: ZEBETA  TAKE 1 TABLET (5 MG TOTAL) BY MOUTH DAILY.    Breztri Aerosphere 160-9-4.8 MCG/ACT Aero inhaler Generic drug: budesonide -glycopyrrolate-formoterol  Inhale 2 puffs into the lungs 2 (two) times daily.   clopidogrel  75 MG tablet Commonly known as: PLAVIX  Take 1 tablet (75 mg total) by mouth daily with breakfast.   empagliflozin  10 MG Tabs tablet Commonly known as: JARDIANCE  Take 1 tablet (10 mg total) by mouth daily.   ezetimibe  10 MG tablet Commonly known as: ZETIA  TAKE 1 TABLET BY MOUTH EVERY DAY   gabapentin  100 MG capsule Commonly known as: NEURONTIN  Take 200 mg by mouth at bedtime.   levothyroxine  125 MCG tablet Commonly known as: SYNTHROID  Take 125 mcg by mouth daily.   oxyCODONE -acetaminophen  5-325 MG tablet Commonly known as: Percocet Take 1 tablet by mouth every 4 (four) hours as needed for severe pain (pain score 7-10).   OXYGEN  Inhale 2 L/L into the lungs at bedtime.   pantoprazole  40 MG tablet Commonly known as: PROTONIX  Take 40 mg by mouth daily.   potassium chloride  SA 20 MEQ tablet Commonly known as: Klor-Con  M20 Take 2 tablets (40 mEq total) by mouth daily. What changed:  how much to take when to take this   spironolactone  25 MG tablet Commonly known as: ALDACTONE  Take 0.5 tablets (12.5 mg total) by mouth daily. Start taking on: October 23, 2023 What changed: how much to take   spironolactone  25 MG tablet Commonly known as: ALDACTONE  Take 1 tablet (25 mg total) by mouth daily. Start taking on: October 23, 2023 What changed: You were already taking a medication with the same name, and this prescription was added. Make sure you understand how and when to take each.   torsemide  20 MG tablet Commonly known as: DEMADEX  Take 2 tablets (40 mg total) by mouth daily.   traZODone  100 MG tablet Commonly known as: DESYREL  Take 100 mg by mouth daily as needed for sleep.   venlafaxine  XR 75 MG 24 hr capsule Commonly known as: EFFEXOR -XR Take 75 mg by mouth daily with breakfast.   Vitamin B-12 500 MCG  Subl Place under the tongue daily.        Follow-up Information     [facility]. Go on 10/30/2023.   Specialty: Cardiology Why: Hospital Follow-Up 10/30/23 @ 1:30 PM Contact information: [address] [place]  [address] [phone]        Cleotilde Oneil FALCON, MD. Schedule an appointment as soon as possible for a visit in 1 week(s).   Specialty: Internal Medicine Why: [facility] Discharge F/UP Contact information: [address] [facility] [place] [address] [phone]         Darron Deatrice LABOR, MD. Schedule an appointment as soon as possible for a visit in 2 week(s).   Specialty: Cardiology Why: [facility] Discharge F/UP Contact information: [address] [place] [address] [phone]  Discharge Exam: Filed Weights   10/19/23 1520 10/21/23 0509 10/22/23 0500  Weight: 78 kg 76 kg 78.6 kg   General exam: Appears calm and comfortable  Respiratory system: Crackles at bases.  Normal work of breathing.  2 L Cardiovascular system: S1-S2, RRR, no murmurs, trace pitting edema BLE Gastrointestinal system: Soft, NT/ND, normal bowel sounds Central nervous system: Alert and oriented. No focal neurological deficits. Extremities: Symmetric 5 x 5 power. Skin: No rashes, lesions or ulcers Psychiatry: Judgement and insight appear normal. Mood & affect appropriate.   Condition at discharge: good  The results of significant diagnostics from this hospitalization (including imaging, microbiology, ancillary and laboratory) are listed below for reference.   Imaging Studies: DG Chest Portable 1 View Result Date: 10/19/2023 CLINICAL DATA:  Shortness of breath and cough EXAM: PORTABLE CHEST 1 VIEW COMPARISON:  06/29/2023, chest CT 12/08/2022 FINDINGS: Mild cardiomegaly with central vascular congestion. Possible small left effusion.  Aortic atherosclerosis. Stent superior to the aortic arch. IMPRESSION: Mild cardiomegaly with central vascular congestion and possible small left effusion. Electronically Signed   By: Luke Bun M.D.   On: 10/19/2023 16:17    Microbiology: Results for orders placed or performed during the hospital encounter of 10/19/23  Resp panel by RT-PCR (RSV, Flu A&B, Covid) Anterior Nasal Swab     Status: None   Collection Time: 10/19/23  4:18 PM   Specimen: Anterior Nasal Swab  Result Value Ref Range Status   SARS Coronavirus 2 by RT PCR NEGATIVE NEGATIVE Final    Comment: (NOTE) SARS-CoV-2 target nucleic acids are NOT DETECTED.  The SARS-CoV-2 RNA is generally detectable in upper respiratory specimens during the acute phase of infection. The lowest concentration of SARS-CoV-2 viral copies this assay can detect is 138 copies/mL. A negative result does not preclude SARS-Cov-2 infection and should not be used as the sole basis for treatment or other patient management decisions. A negative result may occur with  improper specimen collection/handling, submission of specimen other than nasopharyngeal swab, presence of viral mutation(s) within the areas targeted by this assay, and inadequate number of viral copies(<138 copies/mL). A negative result must be combined with clinical observations, patient history, and epidemiological information. The expected result is Negative.  Fact Sheet for Patients:  BloggerCourse.com  Fact Sheet for Healthcare Providers:  SeriousBroker.it  This test is no t yet approved or cleared by the [place]  FDA and  has been authorized for detection and/or diagnosis of SARS-CoV-2 by FDA under an Emergency Use Authorization (EUA). This EUA will remain  in effect (meaning this test can be used) for the duration of the COVID-19 declaration under Section 564(b)(1) of the Act, 21 U.S.C.section 360bbb-3(b)(1), unless the  authorization is terminated  or revoked sooner.       Influenza A by PCR NEGATIVE NEGATIVE Final   Influenza B by PCR NEGATIVE NEGATIVE Final    Comment: (NOTE) The Xpert Xpress SARS-CoV-2/FLU/RSV plus assay is intended as an aid in the diagnosis of influenza from Nasopharyngeal swab specimens and should not be used as a sole basis for treatment. Nasal washings and aspirates are unacceptable for Xpert Xpress SARS-CoV-2/FLU/RSV testing.  Fact Sheet for Patients: BloggerCourse.com  Fact Sheet for Healthcare Providers: SeriousBroker.it  This test is not yet approved or cleared by the [place]  FDA and has been authorized for detection and/or diagnosis of SARS-CoV-2 by FDA under an Emergency Use Authorization (EUA). This EUA will remain in effect (meaning this test can be used) for the duration of the COVID-19  declaration under Section 564(b)(1) of the Act, 21 U.S.C. section 360bbb-3(b)(1), unless the authorization is terminated or revoked.     Resp Syncytial Virus by PCR NEGATIVE NEGATIVE Final    Comment: (NOTE) Fact Sheet for Patients: BloggerCourse.com  Fact Sheet for Healthcare Providers: SeriousBroker.it  This test is not yet approved or cleared by the [place]  FDA and has been authorized for detection and/or diagnosis of SARS-CoV-2 by FDA under an Emergency Use Authorization (EUA). This EUA will remain in effect (meaning this test can be used) for the duration of the COVID-19 declaration under Section 564(b)(1) of the Act, 21 U.S.C. section 360bbb-3(b)(1), unless the authorization is terminated or revoked.  Performed at [facility], [address] [place], [place] [address]     Labs: CBC: Recent Labs  Lab 10/19/23 1523 10/20/23 0541 10/21/23 0339 10/22/23 0520  WBC 7.8 5.2 8.6 5.4  NEUTROABS  --   --  6.5  --   HGB 9.3* 8.7* 8.7* 8.9*   HCT 33.4* 31.0* 30.5* 31.9*  MCV 89.1 88.1 86.4 86.4  PLT 318 282 286 276   Basic Metabolic Panel: Recent Labs  Lab 10/19/23 1523 10/20/23 0541 10/21/23 0339 10/22/23 0520  NA 141 143 141 140  K 4.4 4.1 4.2 4.7  CL 105 104 102 98  CO2 30 28 31  32  GLUCOSE 97 146* 116* 92  BUN 33* 35* 43* 46*  CREATININE 1.74* 1.87* 1.98* 1.72*  CALCIUM  8.9 9.2 8.6* 8.9  MG  --  2.4 2.6* 2.6*   Liver Function Tests: No results for input(s): AST, ALT, ALKPHOS, BILITOT, PROT, ALBUMIN  in the last 168 hours. CBG: No results for input(s): GLUCAP in the last 168 hours.  Discharge time spent: greater than 30 minutes.  Signed: Cresencio Fairly, MD [facility] 10/22/2023

## 2023-10-22 NOTE — Progress Notes (Signed)
 Heart Failure Stewardship Pharmacy Note  PCP: Cleotilde Oneil FALCON, MD PCP-Cardiologist: [name], MD  HPI: PMH of COPD on chronic 2L O2, diastolic CHF, hypertension, dyslipidemia, hypothyroidism and peripheral vascular disease with left subclavian stent placement who presented with worsening dyspnea, orthopnea, and lower extremity edema . On admission, BNP was 851.2, HS-troponin was 8, and iron  studies pending. Chest x-ray noted central vascular congestion with small left effusion.   Pertinent Cardiac History: Echo in 02/2023 showed LVEF of 60-65%, grade II diastolic dysfunction, mild AS.  HS-troponin was 8 on admission. CXR this admission noted interstitial pulmonary edema with small right pleural effusion. After recent admission in November, the patient was taking oral furosemide  and was found to be volume up at her PCP appointment. Torsemide  was started, but patient reports that this did not significantly increase diuresis. Underwent cath on 03/26/23 where a 90% stenosis of mid RCA was identified as the culprit lesion and treated with DES. RHC showed PCWP of 24 and PAP of 49/24 (33), CO/CI of 5.76/3.13, RAP of 14.   Pertinent Lab Values: Creatinine  Date Value Ref Range Status  09/28/2023 1.74 (H) 0.44 - 1.00 mg/dL Final  93/89/7984 9.38 0.60 - 1.30 mg/dL Final   Creatinine, Ser  Date Value Ref Range Status  10/22/2023 1.72 (H) 0.44 - 1.00 mg/dL Final   BUN  Date Value Ref Range Status  10/22/2023 46 (H) 8 - 23 mg/dL Final  94/91/7974 34 (H) 8 - 27 mg/dL Final  93/89/7984 15 7 - 18 mg/dL Final   Potassium  Date Value Ref Range Status  10/22/2023 4.7 3.5 - 5.1 mmol/L Final  09/28/2013 3.9 3.5 - 5.1 mmol/L Final   Sodium  Date Value Ref Range Status  10/22/2023 140 135 - 145 mmol/L Final  08/27/2023 141 134 - 144 mmol/L Final  09/28/2013 133 (L) 136 - 145 mmol/L Final   B Natriuretic Peptide  Date Value Ref Range Status  10/19/2023 851.2 (H) 0.0 - 100.0 pg/mL Final     Comment:    Performed at [facility], [address] [place], [place] [address]   Magnesium   Date Value Ref Range Status  10/22/2023 2.6 (H) 1.7 - 2.4 mg/dL Final    Comment:    Performed at [facility], [address] [place], [place] [address]   Hgb A1c MFr Bld  Date Value Ref Range Status  07/15/2022 7.3 (H) 4.8 - 5.6 % Final    Comment:    (NOTE)         Prediabetes: 5.7 - 6.4         Diabetes: >6.4         Glycemic control for adults with diabetes: <7.0    LDH  Date Value Ref Range Status  07/16/2023 103 98 - 192 U/L Final    Comment:    Performed at [facility], [address] [place], [place] [address]    Vital Signs: Temp:  [97.3 F (36.3 C)-98.3 F (36.8 C)] 97.3 F (36.3 C) (07/03 0752) Pulse Rate:  [57-62] 57 (07/03 0752) Cardiac Rhythm: Sinus bradycardia (07/03 0724) Resp:  [18] 18 (07/03 0752) BP: (133-147)/(46-56) 147/56 (07/03 0752) SpO2:  [93 %-98 %] 95 % (07/03 0752) Weight:  [78.6 kg (173 lb 4.5 oz)] 78.6 kg (173 lb 4.5 oz) (07/03 0500)  Intake/Output Summary (Last 24 hours) at 10/22/2023 1028 Last data filed at 10/22/2023 0408 Gross per 24 hour  Intake 1084 ml  Output 3500 ml  Net -2416  ml    Current Heart Failure Medications:  Loop diuretic: furosemide  80 mg IV BID Beta-Blocker: none ACEI/ARB/ARNI: none MRA: spironolactone  12.5 mg daily SGLT2i: Jardiance  10 mg daily Other: none  Prior to admission Heart Failure Medications:  Loop diuretic: torsemide  40 mg daily Beta-Blocker: bisoprolol  5 mg daily ACEI/ARB/ARNI: none MRA: spironolactone  12.5 mg daily SGLT2i: Jardiance  10 mg daily Other: none  Assessment: 1. Acute on chronic diastolic heart failure (LVEF 60-65%) with grade II diastolic dysfunction. NYHA class III symptoms.  -Symptoms: Patient reports feeling much better. Mild LEE still present, though much improved. Orthopnea improved. Appetite is fair. Denies lethargy. Only able to ambulate to the door  before becoming short of breath.  -Volume: Appears to still be relatively euvolemic. Creatinine and BUN trending down today. Weight is variable and likely inaccurate. Currently on furosemide  80 mg IV BID. Given creatinine is improving, would continue IV furosemide  this morning, then consider transition to torsemide  on discharge -Hemodynamics: BP is elevated. HR 60s.  -SGLT2i: Jardiance  10 mg daily -MRA: Currently on spironolactone  12.5 mg daily. Would prefer to be on spironolactone  25 mg daily with less potassium supplement. -ARNI: BP is elevated. May benefit from ARNI or ARB.  Plan: 1) Medication changes recommended at this time: -Agree with IV furosemide  this morning, can consider de-escalation of diuretics to oral torsemide  40 mg daily on discharge. -Consider reducing potassium to 40 mg daily. Can increase spironolactone  to 25 mg daily tomorrow if potassium declines and creatinine is stable.  2) Patient assistance: -Sim copay is $272.09, Doreen copay is $272.09, Jardiance  copay is $272.09 Copays so high due to a deductible. will be $47.00 once deductible is met  -Will evaluate eligibility for a grant  3) Education: - Patient has been educated on current HF medications and potential additions to HF medication regimen - Patient verbalizes understanding that over the next few months, these medication doses may change and more medications may be added to optimize HF regimen - Patient has been educated on basic disease state pathophysiology and goals of therapy  Medication Assistance / Insurance Benefits Check: Does the patient have prescription insurance?    Type of insurance plan:  Does the patient qualify for medication assistance through manufacturers or grants? Pending  Outpatient Pharmacy: Prior to admission outpatient pharmacy: [facility]      Please do not hesitate to reach out with questions or concerns,  Jaun Bash, PharmD, CPP, BCPS Heart Failure Pharmacist  Phone -  [phone] 10/22/2023 10:28 AM

## 2023-10-25 ENCOUNTER — Encounter: Payer: Self-pay | Admitting: Family

## 2023-10-29 ENCOUNTER — Inpatient Hospital Stay: Attending: Internal Medicine

## 2023-10-29 ENCOUNTER — Telehealth: Payer: Self-pay | Admitting: Family

## 2023-10-29 VITALS — BP 129/43 | HR 58 | Temp 97.5°F | Resp 20

## 2023-10-29 DIAGNOSIS — D509 Iron deficiency anemia, unspecified: Secondary | ICD-10-CM | POA: Diagnosis present

## 2023-10-29 DIAGNOSIS — D649 Anemia, unspecified: Secondary | ICD-10-CM

## 2023-10-29 MED ORDER — IRON SUCROSE 20 MG/ML IV SOLN
200.0000 mg | Freq: Once | INTRAVENOUS | Status: AC
  Administered 2023-10-29: 200 mg via INTRAVENOUS

## 2023-10-29 MED ORDER — SODIUM CHLORIDE 0.9% FLUSH
10.0000 mL | Freq: Once | INTRAVENOUS | Status: AC | PRN
Start: 2023-10-29 — End: 2023-10-29
  Administered 2023-10-29: 10 mL
  Filled 2023-10-29: qty 10

## 2023-10-29 NOTE — Progress Notes (Signed)
 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   Admitted 07/14/22 with E. coli UTI, severe sepsis, and AKI. High-sensitivity troponin rose to 56. She did not undergo cardiology evaluation. In August 2024 CT chest performed secondary to weight loss showed aortic atherosclerosis, unchanged 4 mm right apical pulmonary nodule, enlarged pulmonary trunk consistent with PAH, left adrenal adenoma.   Admitted 03/08/23 due to waking up short of breath & orthopnea after returning from a cruise. BNP 710. CXR showed heart failure. Initially placed on bipap and weaned down to nasal cannula. IV lasix  given. Hypokalemia corrected. Echocardiogram performed on 03/08/2023 showed ejection fraction 60 to 65% with grade 2 diastolic dysfunction. Symptoms improved.   Admitted 03/23/23 due to worsening of shortness of breath, leg swelling and significant weight gain. Developed hypoxia and placed on 5L nasal cannula. BNP elevated and given IV lasix . Cardiology consulted. Chest x-ray showed evidence of pulmonary edema with pleural effusion. CT shows signs consistent with pulmonary hypertension. Oxygen  weaned down to 2L. L and R heart cath 12/5 with stent placed to RCA. Hypokalemia corrected.   RHC/ LHC 03/26/23:   Prox RCA lesion is 45% stenosed.   CULPRIT LESION: Mid RCA lesion is 90% stenosed.   A drug-eluting stent was successfully placed using a STENT ONYX FRONTIER 3.0X22.   Post intervention, there is a 0% residual stenosis.   -------------------------------------   Hemodynamic findings consistent with mild pulmonary hypertension.  (PAP 49/24-33  mmHg, PCWP 24 mmHg transpulmonary Gradient 9 mmHg); LVEDP 16 mmHg.   Compensated CHF: LVEDP 16 mmHg, PCWP 24 mmHg.   Recommend uninterrupted dual antiplatelet therapy with Aspirin  81mg  daily and Clopidogrel  75mg  daily for a minimum of 12 months (ACS-Class I recommendation).  Admitted 07/06/23 with lightheadedness and multiple presyncopal episodes in preceding 24 hours leading to a fall on her lower back. Hypotensive in ED 95/45. RBC transfusion given for hemoglobin of 6.6. Was also given IV iron  infusion. No injuries noted on imaging in the ED. Given IVF for hypotension. GI consulted and will have outpatient follow-up.   Seen in [facility] 04/25 and spironolactone  12.5mg  daily was started.   Admitted 10/19/23 with progressive lower extremity swelling, weight gain, dyspnea, orthopnea, cough. Had doubled her torsemide  at home without any improvement. On admission, BNP was 851.2, HS-troponin was 8, and iron  studies pending. Chest x-ray noted central vascular congestion with small left effusion. IV diuresed.   Had called [facility] 10/25/23 with weight gain and had diuretic increased.   She presents for a HF follow-up visit with a chief complaint of shortness of breath. Has associated fatigue, intermittent chest pain on right upper chest, occasional dizziness. Wearing oxygen  at 2L at bedtime and on exertion. Has been taking torsemide  60mg  daily at home and her weight has ranged from 163-165 pounds.   Continues to smoke 1.5 ppd. Does take her oxygen  off when smoking and removes herself from close proximity of it.   ROS: All systems negative except as listed in HPI, PMH and Problem List.  SH:  Social History   Socioeconomic History   Marital status: Divorced    Spouse name: Not on file   Number  of children: 5   Years of education: Not on file   Highest education level: Associate degree: academic program  Occupational History   Occupation: Retired  Tobacco Use   Smoking status: Every Day    Current packs/day:  1.00    Average packs/day: 1 pack/day for 60.0 years (60.0 ttl pk-yrs)    Types: Cigarettes   Smokeless tobacco: Never  Vaping Use   Vaping status: Former   Substances: Nicotine  Substance and Sexual Activity   Alcohol use: Not Currently   Drug use: Not Currently   Sexual activity: Not Currently  Other Topics Concern   Not on file  Social History Narrative   Lives w/ 3 of her children   Social Drivers of Health   Financial Resource Strain: High Risk (05/07/2023)   Received from [facility]   Overall Financial Resource Strain (CARDIA)    Difficulty of Paying Living Expenses: Hard  Food Insecurity: No Food Insecurity (10/20/2023)   Hunger Vital Sign    Worried About Running Out of Food in the Last Year: Never true    Ran Out of Food in the Last Year: Never true  Transportation Needs: No Transportation Needs (10/20/2023)   PRAPARE - Administrator, Civil Service (Medical): No    Lack of Transportation (Non-Medical): No  Physical Activity: Not on file  Stress: Not on file  Social Connections: Moderately Isolated (10/20/2023)   Social Connection and Isolation Panel    Frequency of Communication with Friends and Family: More than three times a week    Frequency of Social Gatherings with Friends and Family: More than three times a week    Attends Religious Services: Never    Database administrator or Organizations: No    Attends Engineer, structural: 1 to 4 times per year    Marital Status: Divorced  Catering manager Violence: Not At Risk (10/20/2023)   Humiliation, Afraid, Rape, and Kick questionnaire    Fear of Current or Ex-Partner: No    Emotionally Abused: No    Physically Abused: No    Sexually Abused: No    FH:  Family History  Problem Relation Age of Onset   Uterine cancer Mother    Stroke Mother    Aneurysm Mother    Heart attack Father    Coronary artery disease Father    Leukemia Father    Depression Father    Diabetes Father     Hyperlipidemia Father    Hypertension Father    Colon polyps Sister    Obesity Sister    Thyroid  disease Sister    COPD Sister    Lung cancer Sister    Breast cancer Maternal Aunt    Breast cancer Maternal Aunt     Past Medical History:  Diagnosis Date   Allergy    Anxiety    Blood transfusion without reported diagnosis    CHF (congestive heart failure) (HCC)    Chronic heart failure with preserved ejection fraction (HFpEF) (HCC)    a. 02/2023 Echo: EF 60-65%, no rwma, GrII DD, nl RV size/fxn, mild LAE, mild Misty (mean grad ), mod MAC, mild AS (mean grad 9.63mmHg).   CKD (chronic kidney disease), stage III (HCC)    COPD (chronic obstructive pulmonary disease) (HCC)    Coronary artery disease    Depression    Diabetes mellitus without complication (HCC)    Hyperlipidemia    Hypertension    Hypothyroidism    Interstitial  lung disease (HCC)    PAD (peripheral artery disease) (HCC)    a. 07/2017 L subclavian stenosis s/p stenting.   Psoriasis    Squamous cell carcinoma of skin 08/25/2023   SCC IS,  [facility], referral sent for Mohs Dr. Corey   Substance abuse [facility])    Tobacco abuse     Current Outpatient Medications  Medication Sig Dispense Refill   ACCU-CHEK GUIDE TEST test strip      Accu-Chek Softclix Lancets lancets SMARTSIG:Lancet Topical     albuterol  (VENTOLIN  HFA) 108 (90 Base) MCG/ACT inhaler Inhale 2 puffs into the lungs every 6 (six) hours as needed for wheezing or shortness of breath. 1 each 3   ALPRAZolam  (XANAX ) 0.5 MG tablet Take 0.5 mg by mouth 2 (two) times daily as needed for anxiety.      aspirin  81 MG tablet Take 81 mg by mouth at bedtime.      atorvastatin  (LIPITOR) 40 MG tablet Take 1 tablet (40 mg total) by mouth at bedtime. 30 tablet 0   bisoprolol  (ZEBETA ) 5 MG tablet TAKE 1 TABLET (5 MG TOTAL) BY MOUTH DAILY. 30 tablet 5   budeson-glycopyrrolate -formoterol  (BREZTRI  AEROSPHERE) 160-9-4.8 MCG/ACT AERO inhaler Inhale 2 puffs into the lungs 2  (two) times daily.     clopidogrel  (PLAVIX ) 75 MG tablet Take 1 tablet (75 mg total) by mouth daily with breakfast. 90 tablet 3   Cyanocobalamin  (VITAMIN B-12) 500 MCG SUBL Place under the tongue daily.     empagliflozin  (JARDIANCE ) 10 MG TABS tablet Take 1 tablet (10 mg total) by mouth daily. 30 tablet 1   ezetimibe  (ZETIA ) 10 MG tablet TAKE 1 TABLET BY MOUTH EVERY DAY 30 tablet 5   gabapentin  (NEURONTIN ) 100 MG capsule Take 200 mg by mouth at bedtime.     levothyroxine  (SYNTHROID , LEVOTHROID) 125 MCG tablet Take 125 mcg by mouth daily.      oxyCODONE -acetaminophen  (PERCOCET) 5-325 MG tablet Take 1 tablet by mouth every 4 (four) hours as needed for severe pain (pain score 7-10). 20 tablet 0   OXYGEN  Inhale 2 L/L into the lungs at bedtime.     pantoprazole  (PROTONIX ) 40 MG tablet Take 40 mg by mouth daily.     potassium chloride  SA (KLOR-CON  M20) 20 MEQ tablet Take 2 tablets (40 mEq total) by mouth daily. 30 tablet 0   spironolactone  (ALDACTONE ) 25 MG tablet Take 0.5 tablets (12.5 mg total) by mouth daily. 30 tablet 0   spironolactone  (ALDACTONE ) 25 MG tablet Take 1 tablet (25 mg total) by mouth daily. 30 tablet 0   torsemide  (DEMADEX ) 20 MG tablet Take 2 tablets (40 mg total) by mouth daily. 60 tablet 0   traZODone  (DESYREL ) 100 MG tablet Take 100 mg by mouth daily as needed for sleep.     venlafaxine  XR (EFFEXOR -XR) 75 MG 24 hr capsule Take 75 mg by mouth daily with breakfast.     No current facility-administered medications for this visit.   Vitals:   10/30/23 1349  BP: (!) 125/49  Pulse: 69  SpO2: 93%  Weight: 170 lb (77.1 kg)   Wt Readings from Last 3 Encounters:  10/30/23 170 lb (77.1 kg)  10/22/23 173 lb 4.5 oz (78.6 kg)  09/28/23 172 lb (78 kg)   Lab Results  Component Value Date   CREATININE 1.72 (H) 10/22/2023   CREATININE 1.98 (H) 10/21/2023   CREATININE 1.87 (H) 10/20/2023    PHYSICAL EXAM:  General: Well appearing. No resp difficulty HEENT: normal Neck: supple,  no JVD Cor: Regular rhythm, rate. No rubs, gallops or murmurs Lungs: clear Abdomen: soft, nontender, nondistended. Extremities: no cyanosis, clubbing, rash, trace pitting edema bilateral lower legs Neuro: alert & oriented X 3. Moves all 4 extremities w/o difficulty. Affect pleasant    ECG: not done    ASSESSMENT & PLAN:  1: Ischemic heart failure with preserved ejection fraction- - cath with stent 03/26/23 - NYHA class II - minimally fluid up with edema and symptoms - weight up 3 pounds from last visit here 2 months ago - Echo 03/09/23: EF 60-65% with Grade II DD, mild LAE, mild AS - continue bisoprolol  5mg  daily - continue jardiance  10mg  daily  - continue potassium 40meq QD - continue spironolactone  25mg  daily - continue torsemide  60mg  daily - BMET today; could consider weekly metolazone  if needed - not adding salt and has been looking at food labels - BNP 10/19/23 was 851.2  2: HTN- - BP 125/49 - saw PCP Ted) 03/25 - BMP 10/22/23 reviewed: sodium 140, potassium 4.7, creatinine 1.72, GFR 32 - BMET today  3: CAD- - saw cardiology ([place]) 03/25 - continue clopidogrel  75mg  daily - continue ASA 81mg  daily - RHC/ LHC 03/26/23:   Prox RCA lesion is 45% stenosed.   CULPRIT LESION: Mid RCA lesion is 90% stenosed.   A drug-eluting stent was successfully placed using a STENT ONYX FRONTIER 3.0X22.   Post intervention, there is a 0% residual stenosis.   Hemodynamic findings consistent with mild pulmonary hypertension.  (PAP 49/24-33 mmHg, PCWP 24 mmHg transpulmonary Gradient 9 mmHg); LVEDP 16 mmHg.  4: COPD (managed by pulmonology)- - continue albuterol  inhaler PRN; she estimates that she uses this ~ once / month - continue breztri   - saw pulmonology Alica) 04/25 - wearing oxygen  at 2L at bedtime and upon exertion  5: Anemia (managed by hematology)- - saw hematology Oza) 04/25 - iron  infusion done 10/29/23 - Hg 10/22/23 was 8.9  6: Hyperlipidemia- -  continue atorvastatin  40mg  daily - continue ezetimibe  10mg  daily - LDL 09/09/23 was 47 - lipo (a) 03/27/23 was 54.2  7: Tobacco use- - smoking 1.5 ppd of cigarettes - voices confirmation that she removes her oxygen  when smoking and removes herself away from the oxygen  - cessation encouraged   Return in 11/25 as already scheduled, sooner if needed.   Ellouise Class, [place] 10/30/23

## 2023-10-29 NOTE — Progress Notes (Signed)
 Patient declined to wait the 30 minutes for post iron infusion observation today. Tolerated infusion well. VSS.

## 2023-10-29 NOTE — Telephone Encounter (Signed)
 Called to confirm/remind patient of their appointment at the [facility] on 10/30/23.   Appointment:   [x] Confirmed  [] Left mess   [] No answer/No voice mail  [] VM Full/unable to leave message  [] Phone not in service  Patient reminded to bring all medications and/or complete list.  Confirmed patient has transportation. Gave directions, instructed to utilize valet parking.

## 2023-10-30 ENCOUNTER — Other Ambulatory Visit
Admission: RE | Admit: 2023-10-30 | Discharge: 2023-10-30 | Disposition: A | Discharge status: Home / Self Care | Source: Ambulatory Visit | Attending: Family | Admitting: Family

## 2023-10-30 ENCOUNTER — Encounter: Payer: Self-pay | Admitting: Family

## 2023-10-30 ENCOUNTER — Ambulatory Visit: Admitting: Family

## 2023-10-30 VITALS — BP 125/49 | HR 69 | Wt 170.0 lb

## 2023-10-30 DIAGNOSIS — D649 Anemia, unspecified: Secondary | ICD-10-CM

## 2023-10-30 DIAGNOSIS — I10 Essential (primary) hypertension: Secondary | ICD-10-CM

## 2023-10-30 DIAGNOSIS — I5032 Chronic diastolic (congestive) heart failure: Secondary | ICD-10-CM | POA: Diagnosis not present

## 2023-10-30 DIAGNOSIS — E782 Mixed hyperlipidemia: Secondary | ICD-10-CM

## 2023-10-30 DIAGNOSIS — J449 Chronic obstructive pulmonary disease, unspecified: Secondary | ICD-10-CM | POA: Diagnosis not present

## 2023-10-30 DIAGNOSIS — I2510 Atherosclerotic heart disease of native coronary artery without angina pectoris: Secondary | ICD-10-CM

## 2023-10-30 DIAGNOSIS — Z720 Tobacco use: Secondary | ICD-10-CM

## 2023-10-30 LAB — CBC WITH DIFFERENTIAL (CANCER CENTER ONLY)
Abs Immature Granulocytes: 0.07 K/uL (ref 0.00–0.07)
Basophils Absolute: 0.1 K/uL (ref 0.0–0.1)
Basophils Relative: 1 %
Eosinophils Absolute: 0.2 K/uL (ref 0.0–0.5)
Eosinophils Relative: 3 %
HCT: 30.2 % — ABNORMAL LOW (ref 36.0–46.0)
Hemoglobin: 8.6 g/dL — ABNORMAL LOW (ref 12.0–15.0)
Immature Granulocytes: 1 %
Lymphocytes Relative: 19 %
Lymphs Abs: 1.3 K/uL (ref 0.7–4.0)
MCH: 24.4 pg — ABNORMAL LOW (ref 26.0–34.0)
MCHC: 28.5 g/dL — ABNORMAL LOW (ref 30.0–36.0)
MCV: 85.8 fL (ref 80.0–100.0)
Monocytes Absolute: 0.5 K/uL (ref 0.1–1.0)
Monocytes Relative: 7 %
Neutro Abs: 4.8 K/uL (ref 1.7–7.7)
Neutrophils Relative %: 69 %
Platelet Count: 281 K/uL (ref 150–400)
RBC: 3.52 MIL/uL — ABNORMAL LOW (ref 3.87–5.11)
RDW: 20.3 % — ABNORMAL HIGH (ref 11.5–15.5)
WBC Count: 6.9 K/uL (ref 4.0–10.5)
nRBC: 0 % (ref 0.0–0.2)

## 2023-10-30 LAB — BASIC METABOLIC PANEL - CANCER CENTER ONLY
Anion gap: 11 (ref 5–15)
BUN: 39 mg/dL — ABNORMAL HIGH (ref 8–23)
CO2: 25 mmol/L (ref 22–32)
Calcium: 8.6 mg/dL — ABNORMAL LOW (ref 8.9–10.3)
Chloride: 102 mmol/L (ref 98–111)
Creatinine: 1.91 mg/dL — ABNORMAL HIGH (ref 0.44–1.00)
GFR, Estimated: 28 mL/min — ABNORMAL LOW (ref >60)
Glucose, Bld: 108 mg/dL — ABNORMAL HIGH (ref 70–99)
Potassium: 3.7 mmol/L (ref 3.5–5.1)
Sodium: 138 mmol/L (ref 135–145)

## 2023-10-30 LAB — IRON AND TIBC
Iron: 72 ug/dL (ref 28–170)
Saturation Ratios: 18 % (ref 10.4–31.8)
TIBC: 399 ug/dL (ref 250–450)
UIBC: 327 ug/dL

## 2023-10-30 LAB — FERRITIN: Ferritin: 101 ng/mL (ref 11–307)

## 2023-10-30 MED ORDER — SPIRONOLACTONE 25 MG PO TABS: 25.0000 mg | ORAL_TABLET | Freq: Every day | ORAL | Status: DC

## 2023-10-30 MED ORDER — TORSEMIDE 20 MG PO TABS: 60.0000 mg | ORAL_TABLET | Freq: Every day | ORAL | Status: DC

## 2023-10-30 NOTE — Patient Instructions (Signed)
 Lab Work:  Go over to the MEDICAL MALL. Go pass the gift shop and have your blood work completed.  We will only call you if the results are abnormal or if the provider would like to make medication changes.   At the [facility], you and your health needs are our priority. We have a designated team specialized in the treatment of Heart Failure. This Care Team includes your primary Heart Failure Specialized Cardiologist (physician), Advanced Practice Providers (APPs- Physician Assistants and Nurse Practitioners), and Pharmacist who all work together to provide you with the care you need, when you need it.   You may see any of the following providers on your designated Care Team at your next follow up:  Dr. Toribio Fuel Dr. Ezra Shuck Dr. Ria Commander Dr. Odis Brownie Ellouise Class, FNP Jaun Bash, RPH-CPP  Please be sure to bring in all your medications bottles to every appointment.   Need to Contact Us :  If you have any questions or concerns before your next appointment please send us  a message through [facility] or call our office at [phone].    TO LEAVE A MESSAGE FOR THE NURSE SELECT OPTION 2, PLEASE LEAVE A MESSAGE INCLUDING: YOUR NAME DATE OF BIRTH CALL BACK NUMBER REASON FOR CALL**this is important as we prioritize the call backs  YOU WILL RECEIVE A CALL BACK THE SAME DAY AS LONG AS YOU CALL BEFORE 4:00 PM

## 2023-10-31 ENCOUNTER — Encounter: Payer: Self-pay | Admitting: Family

## 2023-11-01 ENCOUNTER — Other Ambulatory Visit: Payer: Self-pay | Admitting: Family

## 2023-11-01 DIAGNOSIS — I5032 Chronic diastolic (congestive) heart failure: Secondary | ICD-10-CM

## 2023-11-01 MED ORDER — POTASSIUM CHLORIDE CRYS ER 20 MEQ PO TBCR: 40.0000 meq | EXTENDED_RELEASE_TABLET | Freq: Every day | ORAL | Status: DC

## 2023-11-01 MED ORDER — METOLAZONE 2.5 MG PO TABS: 2.5000 mg | ORAL_TABLET | ORAL | 3 refills | Status: DC

## 2023-11-02 ENCOUNTER — Other Ambulatory Visit: Payer: Self-pay

## 2023-11-05 ENCOUNTER — Ambulatory Visit: Payer: Self-pay | Admitting: Family

## 2023-11-05 ENCOUNTER — Ambulatory Visit
Admission: RE | Admit: 2023-11-05 | Discharge: 2023-11-05 | Disposition: A | Discharge status: Home / Self Care | Source: Ambulatory Visit | Attending: Specialist | Admitting: Specialist

## 2023-11-05 ENCOUNTER — Other Ambulatory Visit
Admission: RE | Admit: 2023-11-05 | Discharge: 2023-11-05 | Disposition: A | Discharge status: Home / Self Care | Source: Ambulatory Visit | Attending: Specialist | Admitting: Specialist

## 2023-11-05 DIAGNOSIS — I5032 Chronic diastolic (congestive) heart failure: Secondary | ICD-10-CM | POA: Diagnosis present

## 2023-11-05 DIAGNOSIS — N1832 Chronic kidney disease, stage 3b: Secondary | ICD-10-CM

## 2023-11-05 DIAGNOSIS — N179 Acute kidney failure, unspecified: Secondary | ICD-10-CM

## 2023-11-05 DIAGNOSIS — R918 Other nonspecific abnormal finding of lung field: Secondary | ICD-10-CM | POA: Insufficient documentation

## 2023-11-05 LAB — BASIC METABOLIC PANEL WITH GFR
Anion gap: 13 (ref 5–15)
BUN: 46 mg/dL — ABNORMAL HIGH (ref 8–23)
CO2: 30 mmol/L (ref 22–32)
Calcium: 9.4 mg/dL (ref 8.9–10.3)
Chloride: 94 mmol/L — ABNORMAL LOW (ref 98–111)
Creatinine, Ser: 2.16 mg/dL — ABNORMAL HIGH (ref 0.44–1.00)
GFR, Estimated: 24 mL/min — ABNORMAL LOW (ref >60)
Glucose, Bld: 96 mg/dL (ref 70–99)
Potassium: 4.1 mmol/L (ref 3.5–5.1)
Sodium: 137 mmol/L (ref 135–145)

## 2023-11-09 ENCOUNTER — Other Ambulatory Visit: Payer: Self-pay | Admitting: Specialist

## 2023-11-09 DIAGNOSIS — F17210 Nicotine dependence, cigarettes, uncomplicated: Secondary | ICD-10-CM

## 2023-11-09 DIAGNOSIS — J449 Chronic obstructive pulmonary disease, unspecified: Secondary | ICD-10-CM

## 2023-11-10 NOTE — Telephone Encounter (Signed)
-----   Message from Ellouise DELENA Class sent at 11/05/2023 10:55 AM EDT ----- Kidney function worsening with metolazone . Potassium is normal. Continue weekly metolazone  but decrease torsemide  to 40mg  daily.  Please place nephrology referral.  ----- Message ----- From: Rebecka, Lab In [place] Sent: 11/05/2023  10:46 AM EDT To: Ellouise DELENA Class, FNP

## 2023-11-10 NOTE — Telephone Encounter (Signed)
 Lvm to make pt aware of labs and new referral. Referral sent to [facility] at [phone]. Ov note, order, demos and labs sent via fax.

## 2023-11-11 ENCOUNTER — Telehealth: Payer: Self-pay | Admitting: Family

## 2023-11-11 ENCOUNTER — Other Ambulatory Visit: Payer: Self-pay | Admitting: Family

## 2023-11-11 MED ORDER — TORSEMIDE 20 MG PO TABS: 40.0000 mg | ORAL_TABLET | Freq: Every day | ORAL | Status: DC

## 2023-11-11 NOTE — Telephone Encounter (Signed)
-----   Message from Ellouise DELENA Class sent at 11/05/2023 10:55 AM EDT ----- Kidney function worsening with metolazone . Potassium is normal. Continue weekly metolazone  but decrease torsemide  to 40mg  daily.  Please place nephrology referral.  ----- Message ----- From: Rebecka, Lab In [place] Sent: 11/05/2023  10:46 AM EDT To: Ellouise DELENA Class, FNP

## 2023-11-11 NOTE — Addendum Note (Signed)
 Addended by: SHARL GRATE A on: 11/11/2023 08:52 AM   Modules accepted: Orders

## 2023-11-11 NOTE — Telephone Encounter (Signed)
 Reviewed lab work with pt. Pt verbalized understanding and states she already has an appointment with the nephrologist.

## 2023-11-18 NOTE — Progress Notes (Signed)
 Patient Name: Misty Adams, female   Patient DOB: 07/13/1955 Date of Service: 11/18/2023  Patient MRN: [id number] Provider Creating Note: Saralee Stank, MD  [phone] Primary Care Physician: Cleotilde Oneil FALCON, MD  [address] [place] [address] Additional Physicians/ Providers:   Chief Complaint   Chief Complaint  Patient presents with  . New    History of Present Illness Misty Adams is a 68 y.o. 1-White female with following medical problems: Coronary artery disease with history of stent placement in mid RCA 03/26/2023 Aortic atherosclerosis Pulmonary hypertension-mild COPD requiring oxygen  supplementation History of tobacco use Hypertension Hyperlipidemia Peripheral arterial disease Hypothyroidism Anemia Diabetes type 2 -Retinal scan 10/28/2022-no diabetic retinopathy Anxiety and depression Osteopenia 2D echo-03/08/2013-LVEF 60 to 65%, grade 2 diastolic dysfunction moderate mitral valve calcification Colonoscopy 12/19/2022 Chronic kidney disease-CT in March 2024-chronic left renal atrophy AAA   Hospitalizations: 10/19/2023-acute respiratory failure and acute congestive heart failure  Gross Hematuria: no Hx of Kidney Stone: no NSAIDs: 10 yrs ago high dose Family Hx of Kidney disease: no (fahter's brother had diabetic CKD & Dialysis) iv contrast exposure: February 2024, August 2024, December 2024 Smoking: 1.5 ppd. Started age [age] y/o Occupation: retd Engineer, civil (consulting); hospice  ============================================= dCHF-followed at CHF clinic COPD/emphysema-followed at [facility] clinic by Dr. Theotis Dresser by Dr. Rennie at [facility]   Seen as a new evaluation today for worsening kidney function as noted below.  She is a retired Therapist, music.  She is accompanied by her daughter. Review of records indicate that her baseline creatinine is between 1.6-1.8.  On November 05, 2023 creatinine was noted to have increased to 2.16/GFR 24 .   Nephrology consult is now requested for further evaluation.  Torsemide  dose was decreased to 40 mg daily.  Metolazone  once a week.  Patient does not have any acute complaints today.  Her blood pressure is 145/77 sitting and 119/41 standing. No leg edema.  Medications   Current Outpatient Medications:  .  albuterol  HFA (PROVENTIL  HFA;VENTOLIN  HFA) 108 (90 Base) MCG/ACT inhaler, Inhale 2 puffs into the lungs every 6 (six) hours as needed for wheezing or shortness of breath., Disp: , Rfl:  .  ALPRAZolam  (XANAX ) 0.5 MG tablet, Take 0.5 mg by mouth 2 (two) times daily as needed for anxiety., Disp: , Rfl:  .  atorvastatin  (LIPITOR) 40 MG tablet, Take 40 mg by mouth daily, Disp: , Rfl:  .  bisoprolol  (ZEBETA ) 5 MG tablet, Take 5 mg by mouth 1 (one) time each day, Disp: , Rfl:  .  Budeson-Glycopyrrol-Formoterol  (Breztri  Aerosphere) 160-9-4.8 MCG/ACT aerosol, Inhale 2 puffs in the morning and 2 puffs in the evening., Disp: , Rfl:  .  clopidogrel  (PLAVIX ) 75 MG tablet, Take 1 tablet (75 mg total) by mouth daily with breakfast., Disp: , Rfl:  .  Empagliflozin  10 MG tablet, Take 1 tablet by mouth daily, Disp: , Rfl:  .  ezetimibe  (ZETIA ) 10 MG tablet, Take 10 mg by mouth 1 (one) time each day, Disp: , Rfl:  .  gabapentin  (NEURONTIN ) 100 MG capsule, Take 200 mg by mouth at bedtime., Disp: , Rfl:  .  levothyroxine  (SYNTHROID , LEVOTHROID) 125 MCG tablet, Take 125 mcg by mouth 1 (one) time each day, Disp: , Rfl:  .  metOLazone  2.5 MG tablet, Take 2.5 mg by mouth per week, Disp: , Rfl:  .  pantoprazole  (PROTONIX ) 40 MG EC tablet, Take 40 mg by mouth 1 (one) time each day, Disp: , Rfl:  .  potassium chloride  (KLOR-CON  M20) 20 MEQ  CR tablet, Take 2 tablets (40 mEq total) by mouth daily. And additional 20meq with metolazone , Disp: , Rfl:  .  spironolactone  (ALDACTONE ) 25 MG tablet, Take 25 mg by mouth 1 (one) time each day, Disp: , Rfl:  .  torsemide  (DEMADEX ) 20 MG tablet, Take 40 mg by mouth 1 (one) time each day,  Disp: , Rfl:  .  traZODone  (DESYREL ) 100 MG tablet, Take 100 mg by mouth daily as needed for sleep., Disp: , Rfl:  .  venlafaxine  XR (EFFEXOR -XR) 75 MG 24 hr capsule, Take 75 mg by mouth daily with breakfast., Disp: , Rfl:  .  aspirin  (ST JOSEPH) 81 MG EC tablet, Take 81 mg by mouth 1 (one) time each day, Disp: , Rfl:  .  Cyanocobalamin  500 MCG sublingual tablet, Place 1 tablet under the tongue 1 (one) time each day, Disp: , Rfl:  .  oxygen  (O2) gas, Inhale 2 L/L at bed time, Disp: , Rfl:    Allergies Hydrocodone -acetaminophen  and Fluticasone-umeclidin-vilant  Problem List There is no problem list on file for this patient.   Review of Systems  Constitutional:  Negative for chills, fever, weight gain and weight loss.  HENT:  Negative for hearing loss.   Eyes:  Positive for impaired.  Respiratory:  Positive for shortness of breath.   Cardiovascular:  Negative for chest pain and leg swelling.  Gastrointestinal:  Negative for heartburn and nausea.  Genitourinary:  Negative for dysuria and urgency.  Musculoskeletal:  Negative for myalgias.  Skin:  Negative for rash.  Neurological:  Negative for dizziness.  Endo/Heme/Allergies:  Does not bruise/bleed easily.  Psychiatric/Behavioral:  Negative for depression.      History History reviewed. No pertinent past medical history.  Past Surgical History:  Procedure Laterality Date  . APPENDECTOMY    . CARDIAC CATHETERIZATION Bilateral 03/26/2023   AND CORONARY ANGIOGRAPHY  . CESAREAN SECTION, CLASSIC    . COLONOSCOPY     WITH PROPOFOL   . CYST REMOVAL     left wrist ganglion cyst removal  . DILATION AND CURETTAGE OF UTERUS    . ESOPHAGOGASTRODUODENOSCOPY  12/07/2020  . HYSTERECTOMY    . KNEE SURGERY    . OOPHORECTOMY  1994  . POLYPECTOMY  12/19/2022  . TUBAL LIGATION     Family History  Problem Relation Age of Onset  . Stroke Mother   . Cancer Mother   . Heart disease Father   . Hypertension Father   . Diabetes Father   .  Cancer Father   . Cancer Sister    Social History   Tobacco Use  . Smoking status: Every Day    Current packs/day: 1.50    Types: Cigarettes  . Smokeless tobacco: Never  Substance Use Topics  . Alcohol use: Yes    Comment: rarely    Physical Exam  Vitals BP (!) 119/41 (BP Location: Left upper arm, Patient Position: Standing)   Pulse 64   Temp 97.9 F   Ht 5' 4 (1.626 m)   Wt 170 lb 9.6 oz (77.4 kg)   SpO2 97%   BMI 29.28 kg/m   Vitals reviewed. Constitutional: She appears well-developed. No distress.  Cardiovascular:  Normal rate and regular rhythm.           Murmur heard.She exhibits no edema.  Pulmonary/Chest: Effort normal and breath sounds normal. No respiratory distress.  Abdominal: Soft. There is no abdominal tenderness. No hernia.  Skin: Skin is warm and dry.  Psychiatric: She has a normal mood  and affect. Her behavior is normal.     Laboratory Studies  Lab Results  Component Value Date   GLUCOSE 102 09/09/2023   CALCIUM  9.4 09/09/2023   NA 142 09/09/2023   K 4.4 09/09/2023   CO2 35.5 (H) 09/09/2023   CL 100 09/09/2023   BUN 29 (H) 09/09/2023   CREATININE 1.6 (H) 09/09/2023     Iron  Studies  Lab Units 09/09/23 0841 July 20, 2023 1424 11/28/22 0917 04/17/22 0734 03/17/22 1518  IRON  ug/dL  --   --  17*  --   --   FERRITIN ng/mL 16 19 6* 18 8*  TIBC ug/dL  --   --  489.2*  --   --   IRON  SATURATION %  --   --  3  --   --         Urine  Lab Units 11/18/23 1021 05/05/23 1140 10/16/22 1348 04/17/22 0734  COLOR UA  Amber  --   --   --   CLARITY UA  Clear  --   --   --   KETONES UA  Negative  --   --   --   PH UA  6.0  --   --   --   UROBILINOGEN UA  0.2  --   --   --   ALB MG/G CREAT UR ug/mg  --  13.4 78.2* 6.5    Imaging and Other Studies  07/06/2023-CT lumbar spine without contrast-Paraspinal and other soft tissues: Moderate to heavy aortoiliac and visceral branch vessel atherosclerosis. Stable 3 cm fusiform infrarenal AAA. Stable left  adrenal adenomas. No paraspinal hematoma or mass.     Orders Placed This Encounter  . Renal Function Panel  . Kappa/Lambda free LT chains w/ratio, Serum  . Protein electrophoresis, serum  . Urinalysis with microscopic  . Urine Albumin  / Creatinine Ratio  . POCT Urinalysis, manual with scope     04/13/2023-creatinine 1.52, GFR 37 07/28/2023-creatinine 1.59, GFR 35 10/22/2023-creatinine 1.72, GFR 32 11/05/2023-creatinine 2.16, GFR 24  11/18/2023-office urinalysis-glucose negative, bilirubin negative, ketone negative, specific gravity 1.025, blood trace, pH 6.0, protein 30 mg/dL, nitrite negative, leukocyte trace. Urine microscopic exam-few WBCs, no casts  Impression/Recommendations   Patient is a 68 y.o. 1-White female With coronary artery disease status post stent in mid RCA, atherosclerosis, COPD, pulmonary hypertension, systemic hypertension, hypothyroidism, diabetes, grade 2 diastolic dysfunction, chronic kidney disease  1. Other acute kidney failure (HCC)   2. Chronic kidney disease stage 3B (HCC)   3. Hypertensive chronic kidney disease, unspecified, with chronic kidney disease stage I through stage IV, or unspecified   4. Type 2 diabetes mellitus with diabetic chronic kidney disease (HCC)   5. Abnormal urine    Acute on chronic kidney disease stage IIIb versus progression to stage IV chronic kidney disease, proteinuria Patient has multiple risk factors for CKD including longstanding hypertension, atherosclerosis, multiple IV contrast exposures, history of nonsteroidal use about 10 years ago The cause for recent worsening of renal function from creatinine of 1.6->2.2 is not clear but likely prerenal.  Urinalysis in our office is benign.  Low likelihood of glomerular disease. We will repeat renal panel today along with screening k/l ratio and spep.  Encouraged patient to drink at least 30 to 40 ounces of water daily. Continue atorvastatin , clopidogrel , empagliflozin , aspirin  for  cardiovascular risk reduction and slowing progression of kidney disease.  Hypertension Currently managed with bisoprolol , Volume managed with torsemide  40 mg daily with metolazone  2.5 mg weekly. Today's weight is  170.6 pounds which is at her baseline. Monitor closely.  Type 2 diabetes with complications Managed with diet control and empagliflozin . Hemoglobin A1c 4.9% from 09/09/2023.  Current smoker: Strongly recommended to quit smoking.   Return in about 3 months (around 02/18/2024).   Saralee Stank, MD [facility] [address], [place] [address] Ph: [phone] Fax: [phone]  The following portions of the patient's chart were reviewed in this encounter and updated as appropriate:  Tobacco  Allergies  Meds  Problems  Med Hx  Surg Hx  Fam Hx

## 2023-11-20 NOTE — Progress Notes (Signed)
 Cardiology Clinic Note   Date: 11/26/2023 ID: Misty Adams, DOB July 28, 1955, MRN [id number]  Primary Cardiologist:  [name], MD  Chief Complaint   Misty Adams is a 68 y.o. female who presents to the clinic today for routine follow up.   Patient Profile   Misty Adams is followed by Dr. Cage for the history outlined below.      Past medical history significant for: CAD. R/LHC 03/26/2023: Proximal RCA 45%.  Mid RCA 90%.  PCI with DES 3.0 x 22 mm to mid RCA. Chronic HFpEF. Echo 03/09/2023: EF 60 to 65%.  No RWMA.  Grade II DD.  Normal RV size/function.  Mild LAE.  Mild mitral stenosis, mean gradient 6 mmHg.  Moderate MAC.  Mild AS, mean gradient 9.7 mmHg. R/LHC 03/26/2023: Hemodynamic findings consistent with mild pulmonary hypertension (PAP 49/24-33 mmHg, PCWP 24 mmHg, transpulmonary gradient 9 mmHg). Hypertension. Hyperlipidemia. LPa 03/27/2023: 54.2. Lipid panel 09/09/2023: LDL 47, HDL 40, TG 119, total 111. Subclavian artery stenosis. Carotid duplex 07/15/2017: No evidence of stenosis left ICA.  Left subclavian artery was stenotic with disturbed flow.  Elevated velocities noted in the left proximal subclavian artery suggestive of > 50% stenosis. Aortic arch angiography 08/06/2017: Angioplasty and stenting left subclavian artery. Chronic venous insufficiency. COPD. Interstitial lung disease. GERD. Hypothyroidism. CKD stage IIIb. Tobacco abuse.  In summary, previously evaluated for chest pain in June 2011 with exercise Myoview which was without ischemia or infarct EF 79%.  In April 2019 she underwent stenting of left subclavian artery as above.  In March 2024 she was admitted to [facility] with E. coli UTI, severe sepsis, and AKI.  High-sensitivity troponin rose to 56.  She did not undergo cardiology evaluation.  In August 2024 CT chest performed secondary to weight loss showed aortic atherosclerosis, unchanged 4 mm right apical pulmonary nodule, enlarged pulmonary trunk  consistent with PAH, left adrenal adenoma.  Patient presented to [facility] ED via EMS for shortness of breath on 03/08/2023 and was admitted.  She reported a history of CHF managed by PCP with hydrochlorothiazide and Lasix .  She has never seen cardiology.  She reported stable home weight.  Every 2 to 3 months patient describes episodic tightness in jaws, arms, chest usually occurring at rest, lasting about 30 minutes, and resolving after aspirin .  She is very sedentary at home.  She continues to smoke 1 pack/day and has chronic DOE worsening over the past year.  Patient went on a [place] cruise the week prior to admission and reported feeling more dyspneic and fatigued while on vacation.  She returned home in the early a.m. on 11/17 and went to bed.  Later that morning after eating breakfast she became markedly dyspneic.  She contacted her son who is a paramedic and then EMS.  She was hypoxic in the field and started on BiPAP.  Upon arrival to ED she was afebrile and hypertensive at 175/68, 93% on BiPAP.  EKG showed sinus rhythm, 97 bpm with baseline artifact.  Initial labs: BUN 50, creatinine 2.01, hemoglobin 10.9, BNP 710.1, troponin 18.  Chest x-ray showed mild CHF.  She was treated with IV Lasix  and SL NTG.  Patient was discharged on 03/10/2023.  Secondary to elevated BUN/creatinine, Lasix  and potassium were held until follow-up.  Carvedilol  was increased and amlodipine  was added for hypertension.  Plan for outpatient ischemic evaluation.    Patient was seen for hospital follow-up on 03/16/2023.  Her PCP had started her on torsemide  secondary to increased lower  extremity edema and dyspnea.  At the time of her visit she continued to be volume up with approximately 9 pound weight gain since discharge from hospital.  She was instructed to increase torsemide  for 3 days then resume daily dosing.  Patient presented for close follow-up December 2024 with increased lower extremity edema and weight gain.  She was very  dyspneic with low SpO2.  Breath sounds were diminished and she had JVD.  She was sent to ED for further management and was admitted.  She was diuresed with IV Lasix .  She underwent R/LHC and had PCI with DES to mid RCA.  Hemodynamic findings consistent with mild pulmonary hypertension.  Patient followed up with advanced heart clinic post hospitalization and reported improving dyspnea.  She was instructed to decrease torsemide  to 20 mg daily with an additional 20 mg for weight gain or edema. She was seen on 04/13/2023 for hospital follow-up.  She reported taking an extra dose of torsemide  for weight gain the week prior.  Dyspnea continued to improve and she was not wearing supplemental O2.  No medication changes were made.  Patient was seen by Ellouise Class, FNP in the advanced heart failure clinic on 06/29/2023 for routine follow-up.  She reported moderate dyspnea with minimal exertion that she felt was getting better since increasing torsemide .  She reported taking 2 torsemide  twice a day the last few days with improvement in dyspnea and decline in weight.  She was given metolazone  to take every other day x 2 doses.  Patient presented to the ED on 07/06/2023 with syncopal episodes.  She reported initially feeling lightheaded during the day and it worsened into the evening.  She got up to get something at her front door when she suddenly felt more lightheaded and like she was going to pass out.  She sat down but when she tried to stand up she felt like her legs gave out and she fell back hitting her head.  She feels she did not fully lose consciousness during the episode.  She had a second episode with her daughter in which she was fully unconscious for moment.  Initial labs: WBC 11.7, hemoglobin 7.5, sodium 134, potassium 2.4, creatinine 2.15, BUN 69, respiratory panel negative, magnesium  2.3.  Troponin negative x 3.  Head CT was negative.  She was hypertensive upon arrival to ED with BP 95/45.  She was transfused  1 unit packed RBCs as well as IV iron  infusion.  Patient declined GI evaluation and asked to be discharged home.  Patient was discharged on 07/08/2023 to follow-up with GI, hematology, primary care as an outpatient.   Patient was last seen in the office by me on 07/15/2023 for hospital follow-up.  She felt improved since hospital discharge and presented without supplemental O2.  Upon hospital discharge she had been instructed to stop torsemide  and noted an increase in edema as well as weight gain.  She contacted advanced heart failure clinic and was instructed to resume torsemide  with improvement of symptoms and weight to 4 pounds up from baseline weight (160 LB).  She was continuing to hold spironolactone .  She underwent hospital admission 10/19/2023 to 10/22/2023 for heart failure exacerbation.  She was diuresed with IV Lasix  with improvement of symptoms.  She was seen by advanced heart failure clinic, Ellouise Class, FNP, on 10/30/2023.  She had contacted the heart failure clinic on 10/25/2023 with reports of weight gain and her diuretic was increased.  At the time of her visit she had minimal  edema.  No medication changes were made.  Patient had labs drawn on 11/05/2023 which showed worsening kidney function.  She was instructed to continue weekly metolazone  but decrease torsemide  to 40 mg daily.  She was referred to nephrology.     History of Present Illness    Today, patient is accompanied by her daughter. She reports 5 lb weight gain over last 4 days with some increased lower extremity edema and abdominal tightness. She is more short of breath from her baseline today. She has an occasional mild cough. She contacted advanced heart failure clinic and Ellouise Class, FNP wants to check BMP prior to making any changes to diuretic. She states she had a great day yesterday with no increased symptoms. She denies chest pain, pressure or tightness. No palpitations.     ROS: All other systems reviewed and are  otherwise negative except as noted in History of Present Illness.  EKGs/Labs Reviewed       No EKG ordered today.   07/06/2023: ALT 9; AST 10 11/05/2023: BUN 46; Creatinine, Ser 2.16; Potassium 4.1; Sodium 137   10/30/2023: Hemoglobin 8.6; WBC Count 6.9   No results found for requested labs within last 365 days.   10/19/2023: B Natriuretic Peptide 851.2    Physical Exam    VS:  BP (!) 140/60 (BP Location: Left Arm, Patient Position: Sitting, Cuff Size: Normal)   Pulse 62   Ht 5' 4 (1.626 m)   Wt 170 lb 9.6 oz (77.4 kg)   SpO2 99% Comment: 2 L  BMI 29.28 kg/m  , BMI Body mass index is 29.28 kg/m.  GEN: Well nourished, well developed, in no acute distress. Neck: No JVD or carotid bruits. Cardiac:  RRR. 2/6 systolic murmur. No rubs or gallops.   Respiratory:  Respirations regular and unlabored. Clear to auscultation without rales, wheezing or rhonchi. GI: Soft, nontender, nondistended. Extremities: Radials/DP/PT 2+ and equal bilaterally. No clubbing or cyanosis. 1+ pitting edema bilateral lower extremities.   Skin: Warm and dry, no rash. Neuro: Strength intact.  Assessment & Plan   CAD S/p PCI with DES to mid RCA 03/26/2023.  Patient denies chest pain, pressure or tightness. -Continue aspirin , Plavix , atorvastatin , Zetia , bisoprolol .   Chronic HFpEF Echo November 2024 showed normal LV/RV function, Grade II DD, mild LAE, mild mitral stenosis/aortic stenosis.  Patient with hospital admission 10/19/2023 to 10/22/2023 for heart failure exacerbation.  She reports increased weight - 5 lb over last 4 days with increased lower extremity edema and abdominal tightness. Shortness of breath increased from baseline today. 1+ pitting edema bilateral lower extremities. Breath sounds diminished at bilateral bases with no wheezing, rales or rhonchi.  She contacted advanced heart failure clinic to report symptoms. Ellouise Class, FNP ordered BMP before making medication changes.   -Continue Jardiance ,  bisoprolol , torsemide , spironolactone , metolazone  once a week. -BMP today.   Hypertension BP today 140/60. No report of headaches or dizziness.  -Continue bisoprolol .   Hyperlipidemia LDL 47 May 2025, at goal. -Continue atorvastatin  and Zetia .   Anemia Patient has a history of anemia. Shortness of breath increased from baseline today.  - CBC today.    Tobacco abuse Patient continues to smoke. She is working on quitting.   -Encouraged to continue to cut back on smoking.  Disposition: BMP as ordered by Ellouise Class, FNP, and CBC today. Keep follow up with advanced heart failure clinic. Return in 6 months or sooner as needed.          Signed, Barnie  EMERSON Hila, DNP, NP-C

## 2023-11-25 ENCOUNTER — Ambulatory Visit: Admitting: Dermatology

## 2023-11-25 ENCOUNTER — Encounter: Payer: Self-pay | Admitting: Dermatology

## 2023-11-25 VITALS — BP 144/62 | HR 65

## 2023-11-25 DIAGNOSIS — Z1889 Other specified retained foreign body fragments: Secondary | ICD-10-CM | POA: Diagnosis not present

## 2023-11-25 DIAGNOSIS — D492 Neoplasm of unspecified behavior of bone, soft tissue, and skin: Secondary | ICD-10-CM

## 2023-11-25 DIAGNOSIS — L905 Scar conditions and fibrosis of skin: Secondary | ICD-10-CM | POA: Diagnosis not present

## 2023-11-25 DIAGNOSIS — C4492 Squamous cell carcinoma of skin, unspecified: Secondary | ICD-10-CM

## 2023-11-25 DIAGNOSIS — L923 Foreign body granuloma of the skin and subcutaneous tissue: Secondary | ICD-10-CM | POA: Diagnosis not present

## 2023-11-25 DIAGNOSIS — D485 Neoplasm of uncertain behavior of skin: Secondary | ICD-10-CM

## 2023-11-25 DIAGNOSIS — Z86007 Personal history of in-situ neoplasm of skin: Secondary | ICD-10-CM | POA: Diagnosis not present

## 2023-11-25 NOTE — Patient Instructions (Addendum)

## 2023-11-25 NOTE — Progress Notes (Signed)
   Follow Up Visit   Subjective  Misty Adams is a 68 y.o. 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 The patient has been bandaging the wound as directed. The endorse the following concerns: Bump at incision site and still painful. Patient rates her discomforts 5-6 out of 10.  The following portions of the chart were reviewed this encounter and updated as appropriate: medications, allergies, medical history  Review of Systems:  No other skin or systemic complaints except as noted in HPI or Assessment and Plan.  Objective  Well appearing patient in no apparent distress; mood and affect are within normal limits.  A focal examination was performed including scalp, head, face & Right Temple All findings within normal limits unless otherwise noted below.  Healing wound with mild erythema  Relevant physical exam findings are noted in the Assessment and Plan.       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, treated on 09/22/23, repaired with complex closure - Reassured that wound is healing well, but has tender nodule within it - Discussed that scars take up to 12 months to mature from the date of surgery - Recommend SPF 30+ to scar daily to prevent purple color - OK to start scar massage at 4-6 weeks post-op - Can consider silicone based products for scar healing NEOPLASM OF UNCERTAIN BEHAVIOR OF SKIN Right Temple Epidermal / dermal shaving  Lesion diameter (cm):  0.3 Informed consent: discussed and consent obtained   Timeout: patient name, date of birth, surgical site, and procedure verified   Procedure prep:   Patient was prepped and draped in usual sterile fashion Prep type:  Isopropyl alcohol Anesthesia: the lesion was anesthetized in a standard fashion   Anesthetic:  1% lidocaine  w/ epinephrine 1-100,000 buffered w/ 8.4% NaHCO3 Instrument used: #15 blade   Hemostasis achieved with: pressure and aluminum chloride   Outcome: patient tolerated procedure well   Post-procedure details: sterile dressing applied and wound care instructions given   Dressing type: bandage and petrolatum    Specimen 1 - Surgical pathology Differential Diagnosis: R/O Suture Reaction vs SK vs reoccurrence NMSC  Check Margins: No SQUAMOUS CELL CARCINOMA OF SKIN   SCAR    Return for Keep Scheduled appt.  I, Jetta Ager, am acting as scribe for RUFUS CHRISTELLA HOLY, MD.  Documentation: I have reviewed the above documentation for accuracy and completeness, and I agree with the above.  RUFUS CHRISTELLA HOLY, MD

## 2023-11-26 ENCOUNTER — Other Ambulatory Visit: Payer: Self-pay | Admitting: Family

## 2023-11-26 ENCOUNTER — Ambulatory Visit: Attending: Student | Admitting: Student

## 2023-11-26 ENCOUNTER — Encounter: Payer: Self-pay | Admitting: Student

## 2023-11-26 VITALS — BP 140/60 | HR 62 | Ht 64.0 in | Wt 170.6 lb

## 2023-11-26 DIAGNOSIS — Z720 Tobacco use: Secondary | ICD-10-CM

## 2023-11-26 DIAGNOSIS — I10 Essential (primary) hypertension: Secondary | ICD-10-CM

## 2023-11-26 DIAGNOSIS — I2510 Atherosclerotic heart disease of native coronary artery without angina pectoris: Secondary | ICD-10-CM | POA: Diagnosis not present

## 2023-11-26 DIAGNOSIS — E785 Hyperlipidemia, unspecified: Secondary | ICD-10-CM

## 2023-11-26 DIAGNOSIS — Z79899 Other long term (current) drug therapy: Secondary | ICD-10-CM | POA: Diagnosis not present

## 2023-11-26 DIAGNOSIS — I5032 Chronic diastolic (congestive) heart failure: Secondary | ICD-10-CM

## 2023-11-26 DIAGNOSIS — D509 Iron deficiency anemia, unspecified: Secondary | ICD-10-CM

## 2023-11-26 NOTE — Patient Instructions (Signed)
 Medication Instructions:   Your physician recommends that you continue on your current medications as directed. Please refer to the Current Medication list given to you today.  *If you need a refill on your cardiac medications before your next appointment, please call your pharmacy*  Lab Work:  Your provider would like for you to have following labs drawn today BMET, CBC.    If you have labs (blood work) drawn today and your tests are completely normal, you will receive your results only by: MyChart Message (if you have MyChart) OR A paper copy in the mail If you have any lab test that is abnormal or we need to change your treatment, we will call you to review the results.  Testing/Procedures:  No test ordered today    Follow-Up: At [facility], you and your health needs are our priority.  As part of our continuing mission to provide you with exceptional heart care, our providers are all part of one team.  This team includes your primary Cardiologist (physician) and Advanced Practice Providers or APPs (Physician Assistants and Nurse Practitioners) who all work together to provide you with the care you need, when you need it.  Your next appointment:    6 month(s)  Provider:   You may see [name], MD or one of the following Advanced Practice Providers on your designated Care Team:   OR  Barnie Hila, NP    We recommend signing up for the patient portal called MyChart.  Sign up information is provided on this After Visit Summary.  MyChart is used to connect with patients for Virtual Visits (Telemedicine).  Patients are able to view lab/test results, encounter notes, upcoming appointments, etc.  Non-urgent messages can be sent to your provider as well.   To learn more about what you can do with MyChart, go to [email or web address].

## 2023-11-27 ENCOUNTER — Other Ambulatory Visit: Payer: Self-pay | Admitting: *Deleted

## 2023-11-27 DIAGNOSIS — D500 Iron deficiency anemia secondary to blood loss (chronic): Secondary | ICD-10-CM

## 2023-11-27 DIAGNOSIS — D649 Anemia, unspecified: Secondary | ICD-10-CM

## 2023-11-27 LAB — CBC WITH DIFFERENTIAL/PLATELET
Basophils Absolute: 0.1 x10E3/uL (ref 0.0–0.2)
Basos: 1 %
EOS (ABSOLUTE): 0.3 x10E3/uL (ref 0.0–0.4)
Eos: 2 %
Hematocrit: 33.5 % — ABNORMAL LOW (ref 34.0–46.6)
Hemoglobin: 9.3 g/dL — ABNORMAL LOW (ref 11.1–15.9)
Immature Grans (Abs): 0.1 x10E3/uL (ref 0.0–0.1)
Immature Granulocytes: 1 %
Lymphocytes Absolute: 1.4 x10E3/uL (ref 0.7–3.1)
Lymphs: 14 %
MCH: 22.9 pg — ABNORMAL LOW (ref 26.6–33.0)
MCHC: 27.8 g/dL — ABNORMAL LOW (ref 31.5–35.7)
MCV: 82 fL (ref 79–97)
Monocytes Absolute: 0.6 x10E3/uL (ref 0.1–0.9)
Monocytes: 6 %
Neutrophils Absolute: 8 x10E3/uL — ABNORMAL HIGH (ref 1.4–7.0)
Neutrophils: 76 %
Platelets: 330 x10E3/uL (ref 150–450)
RBC: 4.07 x10E6/uL (ref 3.77–5.28)
RDW: 17.9 % — ABNORMAL HIGH (ref 11.7–15.4)
WBC: 10.4 x10E3/uL (ref 3.4–10.8)

## 2023-11-27 LAB — BASIC METABOLIC PANEL WITH GFR
BUN/Creatinine Ratio: 22 (ref 12–28)
BUN: 39 mg/dL — ABNORMAL HIGH (ref 8–27)
CO2: 23 mmol/L (ref 20–29)
Calcium: 9.6 mg/dL (ref 8.7–10.3)
Chloride: 90 mmol/L — ABNORMAL LOW (ref 96–106)
Creatinine, Ser: 1.79 mg/dL — ABNORMAL HIGH (ref 0.57–1.00)
Glucose: 81 mg/dL (ref 70–99)
Potassium: 4.1 mmol/L (ref 3.5–5.2)
Sodium: 134 mmol/L (ref 134–144)
eGFR: 30 mL/min/1.73 — ABNORMAL LOW (ref >59)

## 2023-11-28 ENCOUNTER — Ambulatory Visit: Payer: Self-pay | Admitting: Student

## 2023-11-30 ENCOUNTER — Inpatient Hospital Stay

## 2023-11-30 ENCOUNTER — Encounter: Payer: Self-pay | Admitting: Internal Medicine

## 2023-11-30 ENCOUNTER — Inpatient Hospital Stay: Attending: Internal Medicine

## 2023-11-30 ENCOUNTER — Inpatient Hospital Stay: Admitting: Internal Medicine

## 2023-11-30 VITALS — BP 127/65 | HR 67 | Resp 17 | Wt 167.3 lb

## 2023-11-30 VITALS — BP 138/39 | HR 64

## 2023-11-30 DIAGNOSIS — N184 Chronic kidney disease, stage 4 (severe): Secondary | ICD-10-CM | POA: Insufficient documentation

## 2023-11-30 DIAGNOSIS — E039 Hypothyroidism, unspecified: Secondary | ICD-10-CM | POA: Insufficient documentation

## 2023-11-30 DIAGNOSIS — D649 Anemia, unspecified: Secondary | ICD-10-CM

## 2023-11-30 DIAGNOSIS — D500 Iron deficiency anemia secondary to blood loss (chronic): Secondary | ICD-10-CM

## 2023-11-30 DIAGNOSIS — F17210 Nicotine dependence, cigarettes, uncomplicated: Secondary | ICD-10-CM | POA: Insufficient documentation

## 2023-11-30 DIAGNOSIS — Z7984 Long term (current) use of oral hypoglycemic drugs: Secondary | ICD-10-CM | POA: Insufficient documentation

## 2023-11-30 DIAGNOSIS — Z79899 Other long term (current) drug therapy: Secondary | ICD-10-CM | POA: Insufficient documentation

## 2023-11-30 DIAGNOSIS — D631 Anemia in chronic kidney disease: Secondary | ICD-10-CM | POA: Diagnosis present

## 2023-11-30 DIAGNOSIS — I130 Hypertensive heart and chronic kidney disease with heart failure and stage 1 through stage 4 chronic kidney disease, or unspecified chronic kidney disease: Secondary | ICD-10-CM | POA: Insufficient documentation

## 2023-11-30 DIAGNOSIS — E1122 Type 2 diabetes mellitus with diabetic chronic kidney disease: Secondary | ICD-10-CM | POA: Insufficient documentation

## 2023-11-30 LAB — CBC WITH DIFFERENTIAL (CANCER CENTER ONLY)
Abs Immature Granulocytes: 0.04 K/uL (ref 0.00–0.07)
Basophils Absolute: 0.1 K/uL (ref 0.0–0.1)
Basophils Relative: 1 %
Eosinophils Absolute: 0.3 K/uL (ref 0.0–0.5)
Eosinophils Relative: 3 %
HCT: 31.3 % — ABNORMAL LOW (ref 36.0–46.0)
Hemoglobin: 8.9 g/dL — ABNORMAL LOW (ref 12.0–15.0)
Immature Granulocytes: 0 %
Lymphocytes Relative: 16 %
Lymphs Abs: 1.5 K/uL (ref 0.7–4.0)
MCH: 22.6 pg — ABNORMAL LOW (ref 26.0–34.0)
MCHC: 28.4 g/dL — ABNORMAL LOW (ref 30.0–36.0)
MCV: 79.6 fL — ABNORMAL LOW (ref 80.0–100.0)
Monocytes Absolute: 0.6 K/uL (ref 0.1–1.0)
Monocytes Relative: 6 %
Neutro Abs: 7.4 K/uL (ref 1.7–7.7)
Neutrophils Relative %: 74 %
Platelet Count: 369 K/uL (ref 150–400)
RBC: 3.93 MIL/uL (ref 3.87–5.11)
RDW: 19.8 % — ABNORMAL HIGH (ref 11.5–15.5)
WBC Count: 9.9 K/uL (ref 4.0–10.5)
nRBC: 0 % (ref 0.0–0.2)

## 2023-11-30 LAB — BASIC METABOLIC PANEL - CANCER CENTER ONLY
Anion gap: 14 (ref 5–15)
BUN: 51 mg/dL — ABNORMAL HIGH (ref 8–23)
CO2: 26 mmol/L (ref 22–32)
Calcium: 9.1 mg/dL (ref 8.9–10.3)
Chloride: 92 mmol/L — ABNORMAL LOW (ref 98–111)
Creatinine: 2.01 mg/dL — ABNORMAL HIGH (ref 0.44–1.00)
GFR, Estimated: 26 mL/min — ABNORMAL LOW (ref >60)
Glucose, Bld: 110 mg/dL — ABNORMAL HIGH (ref 70–99)
Potassium: 3.6 mmol/L (ref 3.5–5.1)
Sodium: 132 mmol/L — ABNORMAL LOW (ref 135–145)

## 2023-11-30 LAB — IRON AND TIBC
Iron: 24 ug/dL — ABNORMAL LOW (ref 28–170)
Saturation Ratios: 5 % — ABNORMAL LOW (ref 10.4–31.8)
TIBC: 469 ug/dL — ABNORMAL HIGH (ref 250–450)
UIBC: 445 ug/dL

## 2023-11-30 LAB — FERRITIN: Ferritin: 9 ng/mL — ABNORMAL LOW (ref 11–307)

## 2023-11-30 MED ORDER — IRON SUCROSE 20 MG/ML IV SOLN
200.0000 mg | Freq: Once | INTRAVENOUS | Status: AC
  Administered 2023-11-30 (×2): 200 mg via INTRAVENOUS
  Filled 2023-11-30: qty 10

## 2023-11-30 MED ORDER — SODIUM CHLORIDE 0.9% FLUSH
10.0000 mL | Freq: Once | INTRAVENOUS | Status: AC | PRN
  Administered 2023-11-30 (×2): 10 mL
  Filled 2023-11-30: qty 10

## 2023-11-30 NOTE — Progress Notes (Signed)
 [facility] CONSULT NOTE  Patient Care Team: Cleotilde Oneil FALCON, MD as PCP - General (Internal Medicine) Darron Deatrice LABOR, MD as PCP - Cardiology (Cardiology) Rennie Cindy SAUNDERS, MD as Consulting Physician (Oncology)  CHIEF COMPLAINTS/PURPOSE OF CONSULTATION: ANEMIA   HEMATOLOGY HISTORY  # ANEMIA[Hb; MCV-platelets- WBC; Iron  sat; ferritin;  Kidney: stage III CKD- 30-40s.  CT/US - ;  colo/EGD: 2022;  EGD/colo- awaiting repeating EGD/Dr.Russow-   COPD with ongoing tobacco abuse, abnormal CT chest consistent with interstitial lung disease with recent pulmonary consultation, depression, diabetes, hypertension, PAD  HISTORY OF PRESENTING ILLNESS: Patient ambulating-independently. With family.    Misty Adams 68 y.o.  female pleasant patient with multiple medical problems-including CKD stage IV CAD on aspirin  Plavix  and CHF and history of severeiron deficient anemia sec to AVMs is here for follow-up.  Patient s/p IV venofer . Patient noted to having dyspnea.  Patient denies any blood in stools.  Denies any black loose stool.  Patient not on oral iron .   Review of Systems  Constitutional:  Positive for malaise/fatigue. Negative for chills, diaphoresis, fever and weight loss.  HENT:  Negative for nosebleeds and sore throat.   Eyes:  Negative for double vision.  Respiratory:  Negative for cough, hemoptysis, sputum production, shortness of breath and wheezing.   Cardiovascular:  Negative for chest pain, palpitations, orthopnea and leg swelling.  Gastrointestinal:  Negative for abdominal pain, blood in stool, constipation, diarrhea, heartburn, melena, nausea and vomiting.  Genitourinary:  Negative for dysuria, frequency and urgency.  Musculoskeletal:  Negative for back pain and joint pain.  Skin: Negative.  Negative for itching and rash.  Neurological:  Negative for dizziness, tingling, focal weakness, weakness and headaches.  Endo/Heme/Allergies:  Does not bruise/bleed  easily.  Psychiatric/Behavioral:  Negative for depression. The patient is not nervous/anxious and does not have insomnia.    MEDICAL HISTORY:  Past Medical History:  Diagnosis Date   Allergy    Anemia 11/23   Anxiety    Blood transfusion without reported diagnosis    CHF (congestive heart failure) (HCC)    Chronic heart failure with preserved ejection fraction (HFpEF) (HCC)    a. 02/2023 Echo: EF 60-65%, no rwma, GrII DD, nl RV size/fxn, mild LAE, mild MS (mean grad ), mod MAC, mild AS (mean grad 9.39mmHg).   CKD (chronic kidney disease), stage III (HCC)    COPD (chronic obstructive pulmonary disease) (HCC)    Coronary artery disease    Depression    Diabetes mellitus without complication (HCC)    Emphysema of lung (HCC) 2010   Hyperlipidemia    Hypertension    Hypothyroidism    Interstitial lung disease (HCC)    PAD (peripheral artery disease) (HCC)    a. 07/2017 L subclavian stenosis s/p stenting.   Psoriasis    Squamous cell carcinoma of skin 08/25/2023   SCC IS,  Right [place], referral sent for Mohs Dr. Corey   Substance abuse [facility])    Tobacco abuse     SURGICAL HISTORY: Past Surgical History:  Procedure Laterality Date   ABDOMINAL HYSTERECTOMY     AORTIC ARCH ANGIOGRAPHY N/A 08/06/2017   Procedure: AORTIC ARCH ANGIOGRAPHY;  Surgeon: Laurence Redell CROME, MD;  Location: [facility] INVASIVE CV LAB;  Service: Cardiovascular;  Laterality: N/A;   APPENDECTOMY     BIOPSY  12/19/2022   Procedure: BIOPSY;  Surgeon: Onita Elspeth Sharper, DO;  Location: [facility] ENDOSCOPY;  Service: Gastroenterology;;   CESAREAN SECTION     COLONOSCOPY  COLONOSCOPY WITH PROPOFOL  N/A 12/07/2020   Procedure: COLONOSCOPY WITH PROPOFOL ;  Surgeon: Onita Elspeth Sharper, DO;  Location: [facility] ENDOSCOPY;  Service: Gastroenterology;  Laterality: N/A;   COLONOSCOPY WITH PROPOFOL  N/A 12/19/2022   Procedure: COLONOSCOPY WITH PROPOFOL ;  Surgeon: Onita Elspeth Sharper, DO;  Location: [facility] ENDOSCOPY;  Service:  Gastroenterology;  Laterality: N/A;   CORONARY STENT INTERVENTION N/A 03/26/2023   Procedure: CORONARY STENT INTERVENTION;  Surgeon: Anner Alm ORN, MD;  Location: [facility] INVASIVE CV LAB;  Service: Cardiovascular;  Laterality: N/A;   dilatation and curettage     DILATION AND CURETTAGE OF UTERUS     ESOPHAGOGASTRODUODENOSCOPY N/A 12/07/2020   Procedure: ESOPHAGOGASTRODUODENOSCOPY (EGD);  Surgeon: Onita Elspeth Sharper, DO;  Location: [facility] ENDOSCOPY;  Service: Gastroenterology;  Laterality: N/A;   ESOPHAGOGASTRODUODENOSCOPY (EGD) WITH PROPOFOL  N/A 12/19/2022   Procedure: ESOPHAGOGASTRODUODENOSCOPY (EGD) WITH PROPOFOL ;  Surgeon: Onita Elspeth Sharper, DO;  Location: [facility] ENDOSCOPY;  Service: Gastroenterology;  Laterality: N/A;   KNEE SURGERY     left wrist ganglion cyst removal     OOPHORECTOMY  1994   PERIPHERAL VASCULAR INTERVENTION Left 08/06/2017   Procedure: PERIPHERAL VASCULAR INTERVENTION;  Surgeon: Laurence Redell CROME, MD;  Location: [facility] INVASIVE CV LAB;  Service: Cardiovascular;  Laterality: Left;  subclavian   POLYPECTOMY  12/19/2022   Procedure: POLYPECTOMY;  Surgeon: Onita Elspeth Sharper, DO;  Location: [facility] ENDOSCOPY;  Service: Gastroenterology;;   RIGHT/LEFT HEART CATH AND CORONARY ANGIOGRAPHY N/A 03/26/2023   Procedure: RIGHT/LEFT HEART CATH AND CORONARY ANGIOGRAPHY;  Surgeon: Anner Alm ORN, MD;  Location: [facility] INVASIVE CV LAB;  Service: Cardiovascular;  Laterality: N/A;   TUBAL LIGATION      SOCIAL HISTORY: Social History   Socioeconomic History   Marital status: Divorced    Spouse name: Not on file   Number of children: 5   Years of education: Not on file   Highest education level: Associate degree: academic program  Occupational History   Occupation: Retired  Tobacco Use   Smoking status: Every Day    Current packs/day: 1.00    Average packs/day: 1 pack/day for 60.0 years (60.0 ttl pk-yrs)    Types: Cigarettes   Smokeless tobacco: Never  Vaping Use   Vaping status:  Former   Substances: Nicotine  Substance and Sexual Activity   Alcohol use: Not Currently   Drug use: Not Currently   Sexual activity: Not Currently  Other Topics Concern   Not on file  Social History Narrative   Lives w/ 3 of her children   Social Drivers of Health   Financial Resource Strain: High Risk (05/07/2023)   Received from [facility]   Overall Financial Resource Strain (CARDIA)    Difficulty of Paying Living Expenses: Hard  Food Insecurity: No Food Insecurity (10/20/2023)   Hunger Vital Sign    Worried About Running Out of Food in the Last Year: Never true    Ran Out of Food in the Last Year: Never true  Transportation Needs: No Transportation Needs (10/20/2023)   PRAPARE - Administrator, Civil Service (Medical): No    Lack of Transportation (Non-Medical): No  Physical Activity: Not on file  Stress: Not on file  Social Connections: Moderately Isolated (10/20/2023)   Social Connection and Isolation Panel    Frequency of Communication with Friends and Family: More than three times a week    Frequency of Social Gatherings with Friends and Family: More than three times a week    Attends Religious Services: Never  Active Member of Clubs or Organizations: No    Attends Banker Meetings: 1 to 4 times per year    Marital Status: Divorced  Intimate Partner Violence: Not At Risk (10/20/2023)   Humiliation, Afraid, Rape, and Kick questionnaire    Fear of Current or Ex-Partner: No    Emotionally Abused: No    Physically Abused: No    Sexually Abused: No    FAMILY HISTORY: Family History  Problem Relation Age of Onset   Uterine cancer Mother    Stroke Mother    Aneurysm Mother    COPD Mother    Heart attack Father    Coronary artery disease Father    Leukemia Father    Depression Father    Diabetes Father    Hyperlipidemia Father    Hypertension Father    Alcohol abuse Father    Cancer Father    Heart disease Father     Colon polyps Sister    Obesity Sister    Thyroid  disease Sister    COPD Sister    Lung cancer Sister    Breast cancer Maternal Aunt    Breast cancer Maternal Aunt    Kidney disease Paternal Uncle     ALLERGIES:  is allergic to hydrocodone -acetaminophen , vicodin [hydrocodone -acetaminophen ], and trelegy ellipta [fluticasone-umeclidin-vilant].  MEDICATIONS:  Current Outpatient Medications  Medication Sig Dispense Refill   ACCU-CHEK GUIDE TEST test strip      Accu-Chek Softclix Lancets lancets SMARTSIG:Lancet Topical     albuterol  (VENTOLIN  HFA) 108 (90 Base) MCG/ACT inhaler Inhale 2 puffs into the lungs every 6 (six) hours as needed for wheezing or shortness of breath. 1 each 3   ALPRAZolam  (XANAX ) 0.5 MG tablet Take 0.5 mg by mouth 2 (two) times daily as needed for anxiety.      aspirin  81 MG tablet Take 81 mg by mouth at bedtime.      atorvastatin  (LIPITOR) 40 MG tablet Take 1 tablet (40 mg total) by mouth at bedtime. 30 tablet 0   bisoprolol  (ZEBETA ) 5 MG tablet TAKE 1 TABLET (5 MG TOTAL) BY MOUTH DAILY. 90 tablet 1   budeson-glycopyrrolate -formoterol  (BREZTRI  AEROSPHERE) 160-9-4.8 MCG/ACT AERO inhaler Inhale 2 puffs into the lungs 2 (two) times daily.     clopidogrel  (PLAVIX ) 75 MG tablet Take 1 tablet (75 mg total) by mouth daily with breakfast. 90 tablet 3   Cyanocobalamin  (VITAMIN B-12) 500 MCG SUBL Place under the tongue daily.     empagliflozin  (JARDIANCE ) 10 MG TABS tablet Take 1 tablet (10 mg total) by mouth daily. 30 tablet 1   ezetimibe  (ZETIA ) 10 MG tablet TAKE 1 TABLET BY MOUTH EVERY DAY 90 tablet 1   gabapentin  (NEURONTIN ) 100 MG capsule Take 200 mg by mouth at bedtime.     levothyroxine  (SYNTHROID , LEVOTHROID) 125 MCG tablet Take 125 mcg by mouth daily.      metolazone  (ZAROXOLYN ) 2.5 MG tablet Take 1 tablet (2.5 mg total) by mouth once a week. 4 tablet 3   oxyCODONE -acetaminophen  (PERCOCET) 5-325 MG tablet Take 1 tablet by mouth every 4 (four) hours as needed for severe  pain (pain score 7-10). 20 tablet 0   OXYGEN  Inhale 2 L/L into the lungs at bedtime.     pantoprazole  (PROTONIX ) 40 MG tablet Take 40 mg by mouth daily.     potassium chloride  SA (KLOR-CON  M20) 20 MEQ tablet Take 2 tablets (40 mEq total) by mouth daily. And additional 20meq with metolazone      spironolactone  (ALDACTONE ) 25 MG  tablet Take 1 tablet (25 mg total) by mouth daily. 30 tablet 0   spironolactone  (ALDACTONE ) 25 MG tablet Take 1 tablet (25 mg total) by mouth daily.     spironolactone  (ALDACTONE ) 25 MG tablet TAKE 1 TABLET (25 MG TOTAL) BY MOUTH DAILY. 90 tablet 1   temazepam (RESTORIL) 15 MG capsule Take 15 mg by mouth at bedtime as needed.     torsemide  (DEMADEX ) 20 MG tablet Take 2 tablets (40 mg total) by mouth daily.     traZODone  (DESYREL ) 100 MG tablet Take 100 mg by mouth daily as needed for sleep.     venlafaxine  XR (EFFEXOR -XR) 75 MG 24 hr capsule Take 75 mg by mouth daily with breakfast.     No current facility-administered medications for this visit.     PHYSICAL EXAMINATION:   Vitals:   11/30/23 1418  BP: 127/65  Pulse: 67  Resp: 17  SpO2: 96%     Filed Weights   11/30/23 1418  Weight: 167 lb 4.8 oz (75.9 kg)      Physical Exam Vitals and nursing note reviewed.  HENT:     Head: Normocephalic and atraumatic.     Mouth/Throat:     Pharynx: Oropharynx is clear.  Eyes:     Extraocular Movements: Extraocular movements intact.     Pupils: Pupils are equal, round, and reactive to light.  Cardiovascular:     Rate and Rhythm: Normal rate and regular rhythm.     Heart sounds: Murmur heard.  Pulmonary:     Comments: Decreased breath sounds bilaterally.  Abdominal:     Palpations: Abdomen is soft.  Musculoskeletal:        General: Normal range of motion.     Cervical back: Normal range of motion.  Skin:    General: Skin is warm.  Neurological:     General: No focal deficit present.     Mental Status: She is alert and oriented to person, place, and  time.  Psychiatric:        Behavior: Behavior normal.        Judgment: Judgment normal.      LABORATORY DATA:  I have reviewed the data as listed Lab Results  Component Value Date   WBC 9.9 11/30/2023   HGB 8.9 (L) 11/30/2023   HCT 31.3 (L) 11/30/2023   MCV 79.6 (L) 11/30/2023   PLT 369 11/30/2023   Recent Labs    12/08/22 1446 01/21/23 1346 03/08/23 2325 03/09/23 0648 07/06/23 2211 07/07/23 0207 10/30/23 1432 11/05/23 0955 11/26/23 1123 11/30/23 1409  NA 134*   < > 139   < > 134*   < > 138 137 134 132*  K 3.1*   < > 4.3   < > 2.4*   < > 3.7 4.1 4.1 3.6  CL 92*   < > 103   < > 94*   < > 102 94* 90* 92*  CO2 31   < > 26   < > 27   < > 25 30 23 26   GLUCOSE 118*   < > 147*   < > 137*   < > 108* 96 81 110*  BUN 25*   < > 50*   < > 69*   < > 39* 46* 39* 51*  CREATININE 1.38*   < > 2.01*   < > 2.15*   < > 1.91* 2.16* 1.79* 2.01*  CALCIUM  9.3   < > 8.9   < > 9.4   < >  8.6* 9.4 9.6 9.1  GFRNONAA 41*   < > 26*   < > 24*   < > 28* 24*  --  26*  PROT 7.5  --  7.2  --  6.6  --   --   --   --   --   ALBUMIN  3.7  --  3.4*  --  3.5  --   --   --   --   --   AST 14*  --  15  --  10*  --   --   --   --   --   ALT 11  --  21  --  9  --   --   --   --   --   ALKPHOS 67  --  80  --  55  --   --   --   --   --   BILITOT 0.4  --  0.9  --  0.6  --   --   --   --   --    < > = values in this interval not displayed.     CT CHEST WO CONTRAST Result Date: 11/12/2023 CLINICAL DATA:  Pulmonary nodules, follow-up. EXAM: CT CHEST WITHOUT CONTRAST TECHNIQUE: Multidetector CT imaging of the chest was performed following the standard protocol without IV contrast. RADIATION DOSE REDUCTION: This exam was performed according to the departmental dose-optimization program which includes automated exposure control, adjustment of the mA and/or kV according to patient size and/or use of iterative reconstruction technique. COMPARISON:  Portable chest 10/19/2023, PA Lat chest 06/29/2023 and 03/24/2023, chest CT  with IV contrast 12/08/2022, chest CT without contrast 09/30/2022, and CTA chest 07/13/2017. FINDINGS: Cardiovascular: Cardiac size is normal. The inferolateral mitral ring is heavily calcified. There are three-vessel coronary calcifications greatest in the right coronary artery with evidence of prior stenting of the right coronary artery. There is no pericardial effusion. There are normal caliber pulmonary arteries and veins. There is atherosclerosis in the aorta and great vessels. Left vertebral artery origin from the aortic arch with heavy calcification at the origin and potential for flow-limiting stenosis. Old stenting again noted in the proximal left subclavian artery. There is no aortic aneurysm. Mediastinum/Nodes: No enlarged mediastinal or axillary lymph nodes. Thyroid  gland and esophagus demonstrate no significant findings. Small amount of scattered retained secretions in the trachea. Both main bronchi are clear. Lungs/Pleura: The lungs are emphysematous with centrilobular changes predominating, small amount of apical paraseptal emphysema as well. There stable tiny chronic nodules in the right apex on series 4 axial 11-13, chronic 3 mm right upper lobe nodule on image 22, and a stable chronic 8 mm right middle lobe nodule on 4:72. In the left upper lobe, there is a chronic 3 mm nodule on 4:55. In the left lower lobe there is a 3 mm chronic nodule on 4:63 and a 4 mm nodule anterior to this on the same image which is also unchanged. There is a fissural 4 mm nodule on 4:77, also stable. No new or suspicious nodules are seen. No pleural effusion or thickening and no pneumothorax. There is mild subpleural reticulation in the lungs without evidence of honeycombing. There is mild mosaicism consistent with air trapping and small airway disease. Diffuse bronchial thickening without focal consolidation. Upper Abdomen: Stable 2.3 cm left adrenal adenoma, Hounsfield density is -18. No acute or other significant upper  abdominal findings. Abdominal aortic atherosclerosis. Musculoskeletal: Degenerative change and mild kyphosis thoracic spine with osteopenia.  Slight dextroscoliosis. No acute or significant osseous findings. Visualized chest wall without mass. IMPRESSION: 1. Emphysema with diffuse bronchial thickening and mild mosaicism consistent with air trapping and small airway disease. 2. Mild subpleural reticulation without evidence of honeycombing. 3. Stable chronic lung nodules. No new or suspicious nodules are seen. 4. No further dedicated follow-up for the lung nodules is required, but according to Epic she does have a 60 pack-year smoking history and may qualify for annual screening low-dose chest CT. Low-dose CT lung cancer screening is recommended for patients who are 85-12 years of age with a 20+ pack-year history of smoking and who are currently smoking or quit <= 15 years ago. If criteria are met, low-dose screening exam is recommended in 1 year. 5. Aortic and coronary artery atherosclerosis. 6. Stable 2.3 cm left adrenal adenoma. 7. Osteopenia and degenerative change. Aortic Atherosclerosis (ICD10-I70.0) and Emphysema (ICD10-J43.9). Electronically Signed   By: Francis Quam M.D.   On: 11/12/2023 00:35     ASSESSMENT & PLAN:   Acute on chronic anemia # Anemia- Hb-8.3; Ferritin- 6 [PCP- AUG 2024.]- WBC/platelets are normal. Patient is symptomatic.  Likely due to iron  deficiency - from etiology GI blood loss/ CKD- III.  NOT  gentle iron  [ constipation] .  Stopped iron .   #  Currently s/p Venofer ; Hb 9.5 - proceed with venofer -. Discussed use of erythropoietin stimulating agents like retacrit to stimulate the bone marrow.  Discussed the potential issues with erythropoietin estimating agents-given the risk of stroke thromboembolic events/elevated blood pressure.  However, most of the serious events did not happen when the goal hematocrit is 33/hemoglobin 11.   #Etiology of iron  deficiency:s/p AUG  2024-[[facility]]-EGD-duodenal ulcer;  colonoscopy- multiple polyps; JULY 2024- CT scan abdomen pelvis- no acute process.  S/p APRIL 2025- capsule study- Video capsule study April 2025 with several small AVMs scattered throughout small bowel, not actively bleeding.   # CAD s/p stent- CHF [dec 2024; march 2025, Dr.Arida] asprin + plavix   # CKD- IV [PCP]/ DM - on Toesermide/Spirinolactine weight gain- defer to Dr.Singh.    # DISPOSITION: # venfoer today # venofer - once a weeky x 2 more # Follow up 2 months- MD; labs- cbc/bmp;iron  studies; ferritin-  possible venofer  OR Retacrit-  Dr.B  All questions were answered. The patient knows to call the clinic with any problems, questions or concerns.   Cindy JONELLE Joe, MD 11/30/2023 3:10 PM

## 2023-11-30 NOTE — Progress Notes (Signed)
 Last read by Derin G Chiang at 3:32PM on 11/30/2023.

## 2023-11-30 NOTE — Progress Notes (Signed)
 Patient has no concerns

## 2023-11-30 NOTE — Assessment & Plan Note (Addendum)
#   Anemia- Hb-8.3; Ferritin- 6 [PCP- AUG 2024.]- WBC/platelets are normal. Patient is symptomatic.  Likely due to iron  deficiency - from etiology GI blood loss/ CKD- IV.  NOT  gentle iron  [ constipation]   #  Currently s/p Venofer ; Hb 8.9  proceed with venofer - Discussed use of erythropoietin stimulating agents like retacrit to stimulate the bone marrow.  Discussed the potential issues with erythropoietin estimating agents-given the risk of stroke thromboembolic events/elevated blood pressure.  However, most of the serious events did not happen when the goal hematocrit is 33/hemoglobin 11.   #Etiology of iron  deficiency:s/p AUG 2024-[[facility]]-EGD-duodenal ulcer;  colonoscopy- multiple polyps; JULY 2024- CT scan abdomen pelvis- no acute process.  S/p APRIL 2025- capsule study- Video capsule study April 2025 with several small AVMs scattered throughout small bowel, not actively bleeding.   # CAD s/p stent- CHF [dec 2024; march 2025, Dr.Arida] asprin + plavix   # CKD- IV [PCP]/ DM - on Toesermide/Spirinolactine weight gain- defer to Dr.Singh.    # DISPOSITION: # venfoer today # venofer - once a weeky x 2 more # Follow up 2 months- MD; labs- cbc/bmp;iron  studies; ferritin-  possible venofer  OR Retacrit-  Dr.B

## 2023-12-01 ENCOUNTER — Ambulatory Visit: Payer: Self-pay | Admitting: Dermatology

## 2023-12-01 LAB — SURGICAL PATHOLOGY

## 2023-12-07 ENCOUNTER — Encounter: Payer: Self-pay | Admitting: Internal Medicine

## 2023-12-07 ENCOUNTER — Inpatient Hospital Stay

## 2023-12-07 VITALS — BP 140/58 | HR 60 | Temp 97.5°F | Resp 17

## 2023-12-07 DIAGNOSIS — D649 Anemia, unspecified: Secondary | ICD-10-CM

## 2023-12-07 DIAGNOSIS — I130 Hypertensive heart and chronic kidney disease with heart failure and stage 1 through stage 4 chronic kidney disease, or unspecified chronic kidney disease: Secondary | ICD-10-CM | POA: Diagnosis not present

## 2023-12-07 MED ORDER — IRON SUCROSE 20 MG/ML IV SOLN
200.0000 mg | Freq: Once | INTRAVENOUS | Status: AC
  Administered 2023-12-07: 200 mg via INTRAVENOUS
  Filled 2023-12-07: qty 10

## 2023-12-07 MED ORDER — SODIUM CHLORIDE 0.9% FLUSH
10.0000 mL | Freq: Once | INTRAVENOUS | Status: AC | PRN
  Administered 2023-12-07: 10 mL
  Filled 2023-12-07: qty 10

## 2023-12-07 NOTE — Patient Instructions (Signed)

## 2023-12-07 NOTE — Progress Notes (Signed)
 Patient tolerated Venofer  infusion well. Explained recommendation of 30 min post monitoring. Patient refused to wait post monitoring. Educated on what signs to watch for & to call with any concerns. No questions, discharged. Stable

## 2023-12-14 ENCOUNTER — Inpatient Hospital Stay

## 2023-12-14 VITALS — BP 138/48 | HR 69 | Temp 98.2°F | Resp 18

## 2023-12-14 DIAGNOSIS — I130 Hypertensive heart and chronic kidney disease with heart failure and stage 1 through stage 4 chronic kidney disease, or unspecified chronic kidney disease: Secondary | ICD-10-CM | POA: Diagnosis not present

## 2023-12-14 DIAGNOSIS — D649 Anemia, unspecified: Secondary | ICD-10-CM

## 2023-12-14 MED ORDER — IRON SUCROSE 20 MG/ML IV SOLN
200.0000 mg | Freq: Once | INTRAVENOUS | Status: AC
  Administered 2023-12-14: 200 mg via INTRAVENOUS
  Filled 2023-12-14: qty 10

## 2023-12-30 ENCOUNTER — Other Ambulatory Visit (HOSPITAL_COMMUNITY): Payer: Self-pay

## 2024-01-22 ENCOUNTER — Other Ambulatory Visit: Payer: Self-pay | Admitting: Family

## 2024-02-01 ENCOUNTER — Telehealth: Payer: Self-pay

## 2024-02-01 ENCOUNTER — Other Ambulatory Visit

## 2024-02-01 ENCOUNTER — Ambulatory Visit

## 2024-02-01 ENCOUNTER — Ambulatory Visit: Admitting: Internal Medicine

## 2024-02-01 NOTE — Telephone Encounter (Signed)
   Pre-operative Risk Assessment    Patient Name: Misty Adams  DOB: 1956-04-05 MRN: [id number]   Date of last office visit: 11/26/23 Date of next office visit: 02/25/24   Request for Surgical Clearance    Procedure:  Cataract Extraction by PE, IOL- Left then Right  Date of Surgery:  Clearance 02/12/24 and 02/26/24                               Surgeon:  Dr. Lonni T. Maree Socks Group or Practice Name:  [facility] number:  [phone] Fax number:  [phone]   Type of Clearance Requested:   - Medical  No request to hold blood thinner   Type of Anesthesia:  IV sedation   Additional requests/questions:  Procedure on 02/12/24 and 02/26/24  SignedIval LOISE Collet   02/01/2024, 2:05 PM

## 2024-02-01 NOTE — Telephone Encounter (Signed)
   Patient Name: Misty Adams  DOB: Sep 05, 1955 MRN: [id number]  Primary Cardiologist: [name], MD  Chart reviewed as part of pre-operative protocol coverage. Cataract extractions are recognized in guidelines as low risk surgeries that do not typically require specific preoperative testing or holding of blood thinner therapy. Therefore, given past medical history and time since last visit, based on ACC/AHA guidelines, Misty Adams would be at acceptable risk for the planned procedure without further cardiovascular testing.   I will route this recommendation to the requesting party via Epic fax function and remove from pre-op pool.  Please call with questions.  Orren LOISE Fabry, PA-C 02/01/2024, 4:07 PM

## 2024-02-03 ENCOUNTER — Other Ambulatory Visit: Payer: Self-pay | Admitting: *Deleted

## 2024-02-03 DIAGNOSIS — D500 Iron deficiency anemia secondary to blood loss (chronic): Secondary | ICD-10-CM

## 2024-02-04 ENCOUNTER — Inpatient Hospital Stay (HOSPITAL_BASED_OUTPATIENT_CLINIC_OR_DEPARTMENT_OTHER): Admitting: Nurse Practitioner

## 2024-02-04 ENCOUNTER — Encounter: Payer: Self-pay | Admitting: Nurse Practitioner

## 2024-02-04 ENCOUNTER — Inpatient Hospital Stay

## 2024-02-04 ENCOUNTER — Inpatient Hospital Stay: Attending: Internal Medicine

## 2024-02-04 VITALS — BP 123/46 | HR 65 | Temp 98.7°F | Resp 16 | Ht 64.0 in | Wt 173.8 lb

## 2024-02-04 VITALS — BP 125/35 | HR 55

## 2024-02-04 DIAGNOSIS — N184 Chronic kidney disease, stage 4 (severe): Secondary | ICD-10-CM

## 2024-02-04 DIAGNOSIS — D500 Iron deficiency anemia secondary to blood loss (chronic): Secondary | ICD-10-CM | POA: Diagnosis not present

## 2024-02-04 DIAGNOSIS — D631 Anemia in chronic kidney disease: Secondary | ICD-10-CM

## 2024-02-04 DIAGNOSIS — I130 Hypertensive heart and chronic kidney disease with heart failure and stage 1 through stage 4 chronic kidney disease, or unspecified chronic kidney disease: Secondary | ICD-10-CM | POA: Diagnosis present

## 2024-02-04 DIAGNOSIS — D649 Anemia, unspecified: Secondary | ICD-10-CM

## 2024-02-04 LAB — IRON AND TIBC
Iron: 30 ug/dL (ref 28–170)
Saturation Ratios: 6 % — ABNORMAL LOW (ref 10.4–31.8)
TIBC: 479 ug/dL — ABNORMAL HIGH (ref 250–450)
UIBC: 449 ug/dL

## 2024-02-04 LAB — CBC WITH DIFFERENTIAL (CANCER CENTER ONLY)
Abs Immature Granulocytes: 0.1 K/uL — ABNORMAL HIGH (ref 0.00–0.07)
Basophils Absolute: 0.1 K/uL (ref 0.0–0.1)
Basophils Relative: 1 %
Eosinophils Absolute: 0.3 K/uL (ref 0.0–0.5)
Eosinophils Relative: 3 %
HCT: 30.3 % — ABNORMAL LOW (ref 36.0–46.0)
Hemoglobin: 8.6 g/dL — ABNORMAL LOW (ref 12.0–15.0)
Immature Granulocytes: 1 %
Lymphocytes Relative: 16 %
Lymphs Abs: 1.8 K/uL (ref 0.7–4.0)
MCH: 22.2 pg — ABNORMAL LOW (ref 26.0–34.0)
MCHC: 28.4 g/dL — ABNORMAL LOW (ref 30.0–36.0)
MCV: 78.1 fL — ABNORMAL LOW (ref 80.0–100.0)
Monocytes Absolute: 0.8 K/uL (ref 0.1–1.0)
Monocytes Relative: 7 %
Neutro Abs: 7.9 K/uL — ABNORMAL HIGH (ref 1.7–7.7)
Neutrophils Relative %: 72 %
Platelet Count: 385 K/uL (ref 150–400)
RBC: 3.88 MIL/uL (ref 3.87–5.11)
RDW: 20.5 % — ABNORMAL HIGH (ref 11.5–15.5)
WBC Count: 10.9 K/uL — ABNORMAL HIGH (ref 4.0–10.5)
nRBC: 0 % (ref 0.0–0.2)

## 2024-02-04 LAB — BASIC METABOLIC PANEL - CANCER CENTER ONLY
Anion gap: 13 (ref 5–15)
BUN: 47 mg/dL — ABNORMAL HIGH (ref 8–23)
CO2: 29 mmol/L (ref 22–32)
Calcium: 9.1 mg/dL (ref 8.9–10.3)
Chloride: 92 mmol/L — ABNORMAL LOW (ref 98–111)
Creatinine: 2.04 mg/dL — ABNORMAL HIGH (ref 0.44–1.00)
GFR, Estimated: 26 mL/min — ABNORMAL LOW (ref >60)
Glucose, Bld: 114 mg/dL — ABNORMAL HIGH (ref 70–99)
Potassium: 3.6 mmol/L (ref 3.5–5.1)
Sodium: 134 mmol/L — ABNORMAL LOW (ref 135–145)

## 2024-02-04 LAB — FERRITIN: Ferritin: 6 ng/mL — ABNORMAL LOW (ref 11–307)

## 2024-02-04 MED ORDER — IRON SUCROSE 20 MG/ML IV SOLN
200.0000 mg | Freq: Once | INTRAVENOUS | Status: AC
  Administered 2024-02-04: 200 mg via INTRAVENOUS
  Filled 2024-02-04: qty 10

## 2024-02-04 NOTE — Progress Notes (Signed)
 C/o having no energy, having some worsening SOB at times, on 2L-O2. No blood in stool, her GI provider Dr. Mevelyn stated blood could be lost from her AVM on/around intestines.  She would like to know if she is going to start the iron  injection? Dr. Cleotilde wanted her to ask.

## 2024-02-04 NOTE — Progress Notes (Signed)
 [facility] CONSULT NOTE  Patient Care Team: Cleotilde Oneil FALCON, MD as PCP - General (Internal Medicine) Darron Deatrice LABOR, MD as PCP - Cardiology (Cardiology) Rennie Cindy SAUNDERS, MD as Consulting Physician (Oncology)  CHIEF COMPLAINTS/PURPOSE OF CONSULTATION: ANEMIA   HEMATOLOGY HISTORY  # ANEMIA[Hb; MCV-platelets- WBC; Iron  sat; ferritin;  Kidney: stage III CKD- 30-40s.  CT/US - ;  colo/EGD: 2022;  EGD/colo- awaiting repeating EGD/Dr.Russow-   COPD with ongoing tobacco abuse, abnormal CT chest consistent with interstitial lung disease with recent pulmonary consultation, depression, diabetes, hypertension, PAD  HISTORY OF PRESENTING ILLNESS: Patient ambulating-independently. With family.    Arline G Beckstrand 68 y.o. female pleasant patient with multiple medical problems-including CKD stage IV CAD on aspirin  Plavix  and CHF and history of severe iron  deficient anemia sec to AVMs is here for follow-up. Ongoing dyspnea and fatigue. Some black stools after being constipated but then resolves. Not on oral iron .     Review of Systems  Constitutional:  Positive for malaise/fatigue. Negative for chills, diaphoresis, fever and weight loss.  HENT:  Negative for nosebleeds and sore throat.   Eyes:  Negative for double vision.  Respiratory:  Negative for cough, hemoptysis, sputum production, shortness of breath and wheezing.   Cardiovascular:  Negative for chest pain, palpitations, orthopnea and leg swelling.  Gastrointestinal:  Negative for abdominal pain, blood in stool, constipation, diarrhea, heartburn, melena, nausea and vomiting.  Genitourinary:  Negative for dysuria, frequency and urgency.  Musculoskeletal:  Negative for back pain and joint pain.  Skin: Negative.  Negative for itching and rash.  Neurological:  Negative for dizziness, tingling, focal weakness, weakness and headaches.  Endo/Heme/Allergies:  Does not bruise/bleed easily.  Psychiatric/Behavioral:  Negative for  depression. The patient is not nervous/anxious and does not have insomnia.    MEDICAL HISTORY:  Past Medical History:  Diagnosis Date   Allergy    Anemia 11/23   Anxiety    Blood transfusion without reported diagnosis    CHF (congestive heart failure) (HCC)    Chronic heart failure with preserved ejection fraction (HFpEF) (HCC)    a. 02/2023 Echo: EF 60-65%, no rwma, GrII DD, nl RV size/fxn, mild LAE, mild MS (mean grad ), mod MAC, mild AS (mean grad 9.6mmHg).   CKD (chronic kidney disease), stage III (HCC)    COPD (chronic obstructive pulmonary disease) (HCC)    Coronary artery disease    Depression    Diabetes mellitus without complication (HCC)    Emphysema of lung (HCC) 2010   Hyperlipidemia    Hypertension    Hypothyroidism    Interstitial lung disease (HCC)    PAD (peripheral artery disease)    a. 07/2017 L subclavian stenosis s/p stenting.   Psoriasis    Squamous cell carcinoma of skin 08/25/2023   SCC IS,  [facility] [place], referral sent for Mohs Dr. Corey   Substance abuse [facility])    Tobacco abuse     SURGICAL HISTORY: Past Surgical History:  Procedure Laterality Date   ABDOMINAL HYSTERECTOMY     AORTIC ARCH ANGIOGRAPHY N/A 08/06/2017   Procedure: AORTIC ARCH ANGIOGRAPHY;  Surgeon: Laurence Redell CROME, MD;  Location: [facility] INVASIVE CV LAB;  Service: Cardiovascular;  Laterality: N/A;   APPENDECTOMY     BIOPSY  12/19/2022   Procedure: BIOPSY;  Surgeon: Onita Elspeth Sharper, DO;  Location: [facility] ENDOSCOPY;  Service: Gastroenterology;;   CESAREAN SECTION     COLONOSCOPY     COLONOSCOPY WITH PROPOFOL  N/A 12/07/2020   Procedure: COLONOSCOPY WITH PROPOFOL ;  Surgeon: Onita Elspeth Sharper, DO;  Location: [facility] ENDOSCOPY;  Service: Gastroenterology;  Laterality: N/A;   COLONOSCOPY WITH PROPOFOL  N/A 12/19/2022   Procedure: COLONOSCOPY WITH PROPOFOL ;  Surgeon: Onita Elspeth Sharper, DO;  Location: [facility] ENDOSCOPY;  Service: Gastroenterology;  Laterality: N/A;   CORONARY STENT INTERVENTION  N/A 03/26/2023   Procedure: CORONARY STENT INTERVENTION;  Surgeon: Anner Alm ORN, MD;  Location: [facility] INVASIVE CV LAB;  Service: Cardiovascular;  Laterality: N/A;   dilatation and curettage     DILATION AND CURETTAGE OF UTERUS     ESOPHAGOGASTRODUODENOSCOPY N/A 12/07/2020   Procedure: ESOPHAGOGASTRODUODENOSCOPY (EGD);  Surgeon: Onita Elspeth Sharper, DO;  Location: [facility] ENDOSCOPY;  Service: Gastroenterology;  Laterality: N/A;   ESOPHAGOGASTRODUODENOSCOPY (EGD) WITH PROPOFOL  N/A 12/19/2022   Procedure: ESOPHAGOGASTRODUODENOSCOPY (EGD) WITH PROPOFOL ;  Surgeon: Onita Elspeth Sharper, DO;  Location: [facility] ENDOSCOPY;  Service: Gastroenterology;  Laterality: N/A;   KNEE SURGERY     left wrist ganglion cyst removal     OOPHORECTOMY  1994   PERIPHERAL VASCULAR INTERVENTION Left 08/06/2017   Procedure: PERIPHERAL VASCULAR INTERVENTION;  Surgeon: Laurence Redell CROME, MD;  Location: [facility] INVASIVE CV LAB;  Service: Cardiovascular;  Laterality: Left;  subclavian   POLYPECTOMY  12/19/2022   Procedure: POLYPECTOMY;  Surgeon: Onita Elspeth Sharper, DO;  Location: [facility] ENDOSCOPY;  Service: Gastroenterology;;   RIGHT/LEFT HEART CATH AND CORONARY ANGIOGRAPHY N/A 03/26/2023   Procedure: RIGHT/LEFT HEART CATH AND CORONARY ANGIOGRAPHY;  Surgeon: Anner Alm ORN, MD;  Location: [facility] INVASIVE CV LAB;  Service: Cardiovascular;  Laterality: N/A;   TUBAL LIGATION      SOCIAL HISTORY: Social History   Socioeconomic History   Marital status: Divorced    Spouse name: Not on file   Number of children: 5   Years of education: Not on file   Highest education level: Associate degree: academic program  Occupational History   Occupation: Retired  Tobacco Use   Smoking status: Every Day    Current packs/day: 1.00    Average packs/day: 1 pack/day for 60.0 years (60.0 ttl pk-yrs)    Types: Cigarettes   Smokeless tobacco: Never  Vaping Use   Vaping status: Former   Substances: Nicotine  Substance and Sexual Activity    Alcohol use: Not Currently   Drug use: Not Currently   Sexual activity: Not Currently  Other Topics Concern   Not on file  Social History Narrative   Lives w/ 3 of her children   Social Drivers of Health   Financial Resource Strain: High Risk (05/07/2023)   Received from [facility]   Overall Financial Resource Strain (CARDIA)    Difficulty of Paying Living Expenses: Hard  Food Insecurity: No Food Insecurity (10/20/2023)   Hunger Vital Sign    Worried About Running Out of Food in the Last Year: Never true    Ran Out of Food in the Last Year: Never true  Transportation Needs: No Transportation Needs (10/20/2023)   PRAPARE - Administrator, Civil Service (Medical): No    Lack of Transportation (Non-Medical): No  Physical Activity: Not on file  Stress: Not on file  Social Connections: Moderately Isolated (10/20/2023)   Social Connection and Isolation Panel    Frequency of Communication with Friends and Family: More than three times a week    Frequency of Social Gatherings with Friends and Family: More than three times a week    Attends Religious Services: Never    Database administrator or Organizations: No    Attends  Club or Organization Meetings: 1 to 4 times per year    Marital Status: Divorced  Intimate Partner Violence: Not At Risk (10/20/2023)   Humiliation, Afraid, Rape, and Kick questionnaire    Fear of Current or Ex-Partner: No    Emotionally Abused: No    Physically Abused: No    Sexually Abused: No    FAMILY HISTORY: Family History  Problem Relation Age of Onset   Uterine cancer Mother    Stroke Mother    Aneurysm Mother    COPD Mother    Heart attack Father    Coronary artery disease Father    Leukemia Father    Depression Father    Diabetes Father    Hyperlipidemia Father    Hypertension Father    Alcohol abuse Father    Cancer Father    Heart disease Father    Colon polyps Sister    Obesity Sister    Thyroid  disease Sister     COPD Sister    Lung cancer Sister    Breast cancer Maternal Aunt    Breast cancer Maternal Aunt    Kidney disease Paternal Uncle     ALLERGIES:  is allergic to hydrocodone -acetaminophen , oxycodone , vicodin [hydrocodone -acetaminophen ], and trelegy ellipta [fluticasone-umeclidin-vilant].  MEDICATIONS:  Current Outpatient Medications  Medication Sig Dispense Refill   ACCU-CHEK GUIDE TEST test strip      Accu-Chek Softclix Lancets lancets SMARTSIG:Lancet Topical     albuterol  (VENTOLIN  HFA) 108 (90 Base) MCG/ACT inhaler Inhale 2 puffs into the lungs every 6 (six) hours as needed for wheezing or shortness of breath. 1 each 3   ALPRAZolam  (XANAX ) 0.5 MG tablet Take 0.5 mg by mouth 2 (two) times daily as needed for anxiety.      aspirin  81 MG tablet Take 81 mg by mouth at bedtime.      atorvastatin  (LIPITOR) 40 MG tablet Take 1 tablet (40 mg total) by mouth at bedtime. 30 tablet 0   bisacodyl (DULCOLAX) 5 MG EC tablet Take 5 mg by mouth daily as needed for moderate constipation.     bisoprolol  (ZEBETA ) 5 MG tablet TAKE 1 TABLET (5 MG TOTAL) BY MOUTH DAILY. 90 tablet 1   budeson-glycopyrrolate -formoterol  (BREZTRI  AEROSPHERE) 160-9-4.8 MCG/ACT AERO inhaler Inhale 2 puffs into the lungs 2 (two) times daily.     clopidogrel  (PLAVIX ) 75 MG tablet Take 1 tablet (75 mg total) by mouth daily with breakfast. 90 tablet 3   Cyanocobalamin  (VITAMIN B-12) 500 MCG SUBL Place under the tongue daily.     ezetimibe  (ZETIA ) 10 MG tablet TAKE 1 TABLET BY MOUTH EVERY DAY 90 tablet 1   gabapentin  (NEURONTIN ) 100 MG capsule Take 200 mg by mouth at bedtime.     JARDIANCE  10 MG TABS tablet TAKE ONE TABLET BY MOUTH DAILY 90 tablet 2   levothyroxine  (SYNTHROID , LEVOTHROID) 125 MCG tablet Take 125 mcg by mouth daily.      metolazone  (ZAROXOLYN ) 2.5 MG tablet Take 1 tablet (2.5 mg total) by mouth once a week. 4 tablet 3   OXYGEN  Inhale 2 L/L into the lungs at bedtime.     pantoprazole  (PROTONIX ) 40 MG tablet Take 40 mg by  mouth daily.     potassium chloride  SA (KLOR-CON  M20) 20 MEQ tablet Take 2 tablets (40 mEq total) by mouth daily. And additional 20meq with metolazone      spironolactone  (ALDACTONE ) 25 MG tablet Take 1 tablet (25 mg total) by mouth daily. 30 tablet 0   temazepam (RESTORIL) 15 MG capsule  Take 15 mg by mouth at bedtime as needed.     torsemide  (DEMADEX ) 20 MG tablet Take 2 tablets (40 mg total) by mouth daily.     traZODone  (DESYREL ) 100 MG tablet Take 100 mg by mouth daily as needed for sleep. (Patient taking differently: Take 100 mg by mouth at bedtime.)     venlafaxine  XR (EFFEXOR -XR) 75 MG 24 hr capsule Take 75 mg by mouth daily with breakfast.     Vitamin D , Ergocalciferol , (DRISDOL) 1.25 MG (50000 UNIT) CAPS capsule Take 50,000 Units by mouth every 7 (seven) days.     No current facility-administered medications for this visit.     PHYSICAL EXAMINATION: Vitals:   02/04/24 1400  BP: (!) 123/46  Pulse: 65  Resp: 16  Temp: 98.7 F (37.1 C)  SpO2: 96%   Filed Weights   02/04/24 1400  Weight: 173 lb 12.8 oz (78.8 kg)   Physical Exam Constitutional:      Appearance: She is not ill-appearing.  Eyes:     General: No scleral icterus.    Conjunctiva/sclera: Conjunctivae normal.  Cardiovascular:     Rate and Rhythm: Normal rate and regular rhythm.  Pulmonary:     Comments: Oxygen . diminished Abdominal:     General: There is no distension.     Palpations: Abdomen is soft.     Tenderness: There is no abdominal tenderness. There is no guarding.  Musculoskeletal:        General: No deformity.     Right lower leg: No edema.     Left lower leg: No edema.  Lymphadenopathy:     Cervical: No cervical adenopathy.  Skin:    General: Skin is warm and dry.  Neurological:     Mental Status: She is alert and oriented to person, place, and time. Mental status is at baseline.  Psychiatric:        Mood and Affect: Mood normal.        Behavior: Behavior normal.      LABORATORY DATA:   I have reviewed the data as listed Lab Results  Component Value Date   WBC 10.9 (H) 02/04/2024   HGB 8.6 (L) 02/04/2024   HCT 30.3 (L) 02/04/2024   MCV 78.1 (L) 02/04/2024   PLT 385 02/04/2024   Recent Labs    03/08/23 2325 03/09/23 0648 07/06/23 2211 07/07/23 0207 11/05/23 0955 11/26/23 1123 11/30/23 1409 02/04/24 1404  NA 139   < > 134*   < > 137 134 132* 134*  K 4.3   < > 2.4*   < > 4.1 4.1 3.6 3.6  CL 103   < > 94*   < > 94* 90* 92* 92*  CO2 26   < > 27   < > 30 23 26 29   GLUCOSE 147*   < > 137*   < > 96 81 110* 114*  BUN 50*   < > 69*   < > 46* 39* 51* 47*  CREATININE 2.01*   < > 2.15*   < > 2.16* 1.79* 2.01* 2.04*  CALCIUM  8.9   < > 9.4   < > 9.4 9.6 9.1 9.1  GFRNONAA 26*   < > 24*   < > 24*  --  26* 26*  PROT 7.2  --  6.6  --   --   --   --   --   ALBUMIN  3.4*  --  3.5  --   --   --   --   --  AST 15  --  10*  --   --   --   --   --   ALT 21  --  9  --   --   --   --   --   ALKPHOS 80  --  55  --   --   --   --   --   BILITOT 0.9  --  0.6  --   --   --   --   --    < > = values in this interval not displayed.   Iron /TIBC/Ferritin/ %Sat    Component Value Date/Time   IRON  24 (L) 11/30/2023 1409   TIBC 469 (H) 11/30/2023 1409   FERRITIN 9 (L) 11/30/2023 1409   IRONPCTSAT 5 (L) 11/30/2023 1409     No results found.    ASSESSMENT & PLAN: Acute on chronic anemia # Anemia- Hb-8.3; Ferritin- 6 [PCP- AUG 2024.]- WBC/platelets are normal. Patient is symptomatic.  Likely due to iron  deficiency - from etiology GI blood loss/ CKD- III.  NOT  gentle iron  [ constipation] .  Stopped iron . Hg 8.9; ferritin 9; iron  24; TIBC 469; iron  saturation 5 [Aug 2025]   # Currently s/p Venofer ; Hb 8.6. Proceed with venofer . Ongoing dark stools. May need additional doses based on iron  stores which are still pending at time of visit.   # Anemia of CKD- also likely contributing. Again reviewed use of ESAs and potential risks. Hold for now. If iron  stores replenished and ongoing  anemia w/ hmg < 11, plan to start retacrit.    # Etiology of iron  deficiency: s/p AUG 2024-[[facility]]-EGD- duodenal ulcer;  colonoscopy- multiple polyps; JULY 2024- CT scan abdomen pelvis- no acute process.  S/p APRIL 2025- capsule study- Video capsule study April 2025 with several small AVMs scattered throughout small bowel, not actively bleeding.   # leukocytosis- asymptomatic. Recommend monitoring. If febrile, worsening cough, contact pcp/pulm for evaluation.    # CAD s/p stent- CHF [dec 2024; march 2025, Dr.Arida] asprin + plavix    # CKD- IV [PCP]/ DM - on Toesermide/Spirinolactine weight gain- defer to Dr.Singh.   # smoker- encouraged cessation. Could consider trial chantix and nicotine replacement in future. Provided w/ local resource options.    # DISPOSITION: Venofer  today 2 mo- lab (cbc, bmp, ferritin, iron  studies), Dr Rennie, +/- venofer  OR retacrit- la   All questions were answered. The patient knows to call the clinic with any problems, questions or concerns.   No problem-specific Assessment & Plan notes found for this encounter.  All questions were answered. The patient knows to call the clinic with any problems, questions or concerns.  Tinnie Dawn, DNP, AGNP-C, AOCNP [facility] [phone] (clinic)

## 2024-02-04 NOTE — Patient Instructions (Signed)

## 2024-02-24 ENCOUNTER — Telehealth: Payer: Self-pay | Admitting: Family

## 2024-02-24 NOTE — Telephone Encounter (Signed)
 Called to confirm/remind patient of their appointment at [facility] on 02/25/24.   Appointment:   [x] Confirmed  [] Left mess   [] No answer/No voice mail  [] VM Full/unable to leave message  [] Phone not in service  Patient reminded to bring all medications and/or complete list.  Confirmed patient has transportation. Gave directions, instructed to utilize valet parking.

## 2024-02-24 NOTE — Progress Notes (Unsigned)
 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   Admitted 07/14/22 with E. coli UTI, severe sepsis, and AKI. High-sensitivity troponin rose to 56. She did not undergo cardiology evaluation. In August 2024 CT chest performed secondary to weight loss showed aortic atherosclerosis, unchanged 4 mm right apical pulmonary nodule, enlarged pulmonary trunk consistent with PAH, left adrenal adenoma.   Admitted 03/08/23 due to waking up short of breath & orthopnea after returning from a cruise. BNP 710. CXR showed heart failure. Initially placed on bipap and weaned down to nasal cannula. IV lasix  given. Hypokalemia corrected. Echocardiogram performed on 03/08/2023 showed ejection fraction 60 to 65% with grade 2 diastolic dysfunction. Symptoms improved.   Admitted 03/23/23 due to worsening of shortness of breath, leg swelling and significant weight gain. Developed hypoxia and placed on 5L nasal cannula. BNP elevated and given IV lasix . Cardiology consulted. Chest x-ray showed evidence of pulmonary edema with pleural effusion. CT shows signs consistent with pulmonary hypertension. Oxygen  weaned down to 2L. L and R heart cath 12/5 with stent placed to RCA. Hypokalemia corrected.   RHC/ LHC 03/26/23:   Prox RCA lesion is 45% stenosed.   CULPRIT LESION: Mid RCA lesion is 90% stenosed.   A drug-eluting stent was successfully placed using a STENT ONYX FRONTIER 3.0X22.   Post intervention, there is a 0% residual stenosis.   -------------------------------------   Hemodynamic findings consistent with mild pulmonary hypertension.  (PAP 49/24-33  mmHg, PCWP 24 mmHg transpulmonary Gradient 9 mmHg); LVEDP 16 mmHg.   Compensated CHF: LVEDP 16 mmHg, PCWP 24 mmHg.   Recommend uninterrupted dual antiplatelet therapy with Aspirin  81mg  daily and Clopidogrel  75mg  daily for a minimum of 12 months (ACS-Class I recommendation).  Admitted 07/06/23 with lightheadedness and multiple presyncopal episodes in preceding 24 hours leading to a fall on her lower back. Hypotensive in ED 95/45. RBC transfusion given for hemoglobin of 6.6. Was also given IV iron  infusion. No injuries noted on imaging in the ED. Given IVF for hypotension. GI consulted and will have outpatient follow-up.   Seen in [facility] 04/25 and spironolactone  12.5mg  daily was started.   Admitted 10/19/23 with progressive lower extremity swelling, weight gain, dyspnea, orthopnea, cough. Had doubled her torsemide  at home without any improvement. On admission, BNP was 851.2, HS-troponin was 8, and iron  studies pending. Chest x-ray noted central vascular congestion with small left effusion. IV diuresed.   Had called [facility] 10/25/23 with weight gain and had diuretic increased.   She presents for a HF follow-up visit with a chief complaint of worsening shortness of breath. Has associated fatigue, pedal edema, rare palpitations, occasional dizziness. She has been experiencing cramping in her hands / feet.  Hasn't had metolazone  for the last 2 weeks due to not being able to refill it. She says that her pharmacy told her that it couldn't be filled until December.   Went to the beach end of September and SOB has worsened since her return. Admits to dietary indiscretion while at the beach. Continues to smoke 1.5 ppd. Does take her oxygen  off when smoking and removes herself from close proximity of it.   ROS: All  systems negative except as listed in HPI, PMH and Problem List.  SH:  Social History   Socioeconomic History   Marital status: Divorced    Spouse name: Not on file   Number of children: 5   Years of  education: Not on file   Highest education level: Associate degree: academic program  Occupational History   Occupation: Retired  Tobacco Use   Smoking status: Every Day    Current packs/day: 1.00    Average packs/day: 1 pack/day for 60.0 years (60.0 ttl pk-yrs)    Types: Cigarettes   Smokeless tobacco: Never  Vaping Use   Vaping status: Former   Substances: Nicotine  Substance and Sexual Activity   Alcohol use: Not Currently   Drug use: Not Currently   Sexual activity: Not Currently  Other Topics Concern   Not on file  Social History Narrative   Lives w/ 3 of her children   Social Drivers of Health   Financial Resource Strain: High Risk (05/07/2023)   Received from [facility]   Overall Financial Resource Strain (CARDIA)    Difficulty of Paying Living Expenses: Hard  Food Insecurity: No Food Insecurity (10/20/2023)   Hunger Vital Sign    Worried About Running Out of Food in the Last Year: Never true    Ran Out of Food in the Last Year: Never true  Transportation Needs: No Transportation Needs (10/20/2023)   PRAPARE - Administrator, Civil Service (Medical): No    Lack of Transportation (Non-Medical): No  Physical Activity: Not on file  Stress: Not on file  Social Connections: Moderately Isolated (10/20/2023)   Social Connection and Isolation Panel    Frequency of Communication with Friends and Family: More than three times a week    Frequency of Social Gatherings with Friends and Family: More than three times a week    Attends Religious Services: Never    Database Administrator or Organizations: No    Attends Engineer, Structural: 1 to 4 times per year    Marital Status: Divorced  Catering Manager Violence: Not At Risk (10/20/2023)   Humiliation, Afraid, Rape, and Kick questionnaire    Fear of Current or Ex-Partner: No    Emotionally Abused: No    Physically Abused: No    Sexually Abused: No    FH:  Family History  Problem Relation  Age of Onset   Uterine cancer Mother    Stroke Mother    Aneurysm Mother    COPD Mother    Heart attack Father    Coronary artery disease Father    Leukemia Father    Depression Father    Diabetes Father    Hyperlipidemia Father    Hypertension Father    Alcohol abuse Father    Cancer Father    Heart disease Father    Colon polyps Sister    Obesity Sister    Thyroid  disease Sister    COPD Sister    Lung cancer Sister    Breast cancer Maternal Aunt    Breast cancer Maternal Aunt    Kidney disease Paternal Uncle     Past Medical History:  Diagnosis Date   Allergy    Anemia 11/23   Anxiety    Blood transfusion without reported diagnosis    CHF (congestive heart failure) (HCC)    Chronic heart failure with preserved ejection fraction (HFpEF) (HCC)    a. 02/2023 Echo: EF 60-65%, no rwma, GrII DD,  nl RV size/fxn, mild LAE, mild Misty (mean grad ), mod MAC, mild AS (mean grad 9.7mmHg).   CKD (chronic kidney disease), stage III (HCC)    COPD (chronic obstructive pulmonary disease) (HCC)    Coronary artery disease    Depression    Diabetes mellitus without complication (HCC)    Emphysema of lung (HCC) 2010   Hyperlipidemia    Hypertension    Hypothyroidism    Interstitial lung disease (HCC)    PAD (peripheral artery disease)    a. 07/2017 L subclavian stenosis s/p stenting.   Psoriasis    Squamous cell carcinoma of skin 08/25/2023   SCC IS,  [facility], referral sent for Mohs Dr. Corey   Substance abuse [facility])    Tobacco abuse     Current Outpatient Medications  Medication Sig Dispense Refill   ACCU-CHEK GUIDE TEST test strip      Accu-Chek Softclix Lancets lancets SMARTSIG:Lancet Topical     albuterol  (VENTOLIN  HFA) 108 (90 Base) MCG/ACT inhaler Inhale 2 puffs into the lungs every 6 (six) hours as needed for wheezing or shortness of breath. 1 each 3   ALPRAZolam  (XANAX ) 0.5 MG tablet Take 0.5 mg by mouth 2 (two) times daily as needed for anxiety.      aspirin  81 MG  tablet Take 81 mg by mouth at bedtime.      atorvastatin  (LIPITOR) 40 MG tablet Take 1 tablet (40 mg total) by mouth at bedtime. 30 tablet 0   bisacodyl (DULCOLAX) 5 MG EC tablet Take 5 mg by mouth daily as needed for moderate constipation.     bisoprolol  (ZEBETA ) 5 MG tablet TAKE 1 TABLET (5 MG TOTAL) BY MOUTH DAILY. 90 tablet 1   budeson-glycopyrrolate -formoterol  (BREZTRI  AEROSPHERE) 160-9-4.8 MCG/ACT AERO inhaler Inhale 2 puffs into the lungs 2 (two) times daily.     clopidogrel  (PLAVIX ) 75 MG tablet Take 1 tablet (75 mg total) by mouth daily with breakfast. 90 tablet 3   Cyanocobalamin  (VITAMIN B-12) 500 MCG SUBL Place under the tongue daily.     ezetimibe  (ZETIA ) 10 MG tablet TAKE 1 TABLET BY MOUTH EVERY DAY 90 tablet 1   gabapentin  (NEURONTIN ) 100 MG capsule Take 200 mg by mouth at bedtime.     JARDIANCE  10 MG TABS tablet TAKE ONE TABLET BY MOUTH DAILY 90 tablet 2   levothyroxine  (SYNTHROID , LEVOTHROID) 125 MCG tablet Take 125 mcg by mouth daily.      metolazone  (ZAROXOLYN ) 2.5 MG tablet Take 1 tablet (2.5 mg total) by mouth once a week. 4 tablet 3   OXYGEN  Inhale 2 L/L into the lungs at bedtime.     pantoprazole  (PROTONIX ) 40 MG tablet Take 40 mg by mouth daily.     potassium chloride  SA (KLOR-CON  M20) 20 MEQ tablet Take 2 tablets (40 mEq total) by mouth daily. And additional 20meq with metolazone      spironolactone  (ALDACTONE ) 25 MG tablet Take 1 tablet (25 mg total) by mouth daily. 30 tablet 0   temazepam (RESTORIL) 15 MG capsule Take 15 mg by mouth at bedtime as needed.     torsemide  (DEMADEX ) 20 MG tablet Take 2 tablets (40 mg total) by mouth daily.     traZODone  (DESYREL ) 100 MG tablet Take 100 mg by mouth daily as needed for sleep. (Patient taking differently: Take 100 mg by mouth at bedtime.)     venlafaxine  XR (EFFEXOR -XR) 75 MG 24 hr capsule Take 75 mg by mouth daily with breakfast.     Vitamin D ,  Ergocalciferol , (DRISDOL) 1.25 MG (50000 UNIT) CAPS capsule Take 50,000 Units by  mouth every 7 (seven) days.     No current facility-administered medications for this visit.   Vitals:   02/25/24 0911  BP: (!) 105/38  Pulse: 65  SpO2: 94%  Weight: 179 lb (81.2 kg)   Wt Readings from Last 3 Encounters:  02/25/24 179 lb (81.2 kg)  02/04/24 173 lb 12.8 oz (78.8 kg)  11/30/23 167 lb 4.8 oz (75.9 kg)   Lab Results  Component Value Date   CREATININE 2.16 (H) 02/25/2024   CREATININE 2.04 (H) 02/04/2024   CREATININE 2.01 (H) 11/30/2023    PHYSICAL EXAM:  General: Well appearing.  Cor: No JVD. Regular rhythm, rate.  Lungs: rales in bilateral lower lobes Abdomen: soft, nontender, nondistended. Extremities: 2+ pitting edema left lower leg, 1+ RLE Neuro:. Affect pleasant   ReDs reading: 33 %, normal  ECG: not done    ASSESSMENT & PLAN:  1: Ischemic heart failure with preserved ejection fraction- - cath with stent 03/26/23 - NYHA class III - fluid up with worsening symptoms and weight gain even though ReDs reading is normal  - weight up 9 pounds from last visit here 4 months ago - metolazone  2.5mg  daily X 2 doses and then resume weekly. Resumed the importance of sodium/ fluid restriction.  - Echo 03/09/23: EF 60-65% with Grade II DD, mild LAE, mild AS - continue bisoprolol  5mg  daily - continue jardiance  10mg  daily  - continue potassium 40meq QD - continue spironolactone  25mg  daily - continue torsemide  60mg  daily - not adding salt and has been looking at food labels - BNP 10/19/23 was 851.2 - BNP today  2: HTN- - BP 105/35 - saw PCP Ted) 10/25 - saw nephrology Jil) 07/25. Does not plan to return.  - BMP 02/04/24 reviewed: sodium 134, potassium 3.6, creatinine 2.04, GFR 26 - BMET, Mg today  3: CAD- - saw cardiology ([place]) 08/25 - continue clopidogrel  75mg  daily - continue ASA 81mg  daily - RHC/ LHC 03/26/23:   Prox RCA lesion is 45% stenosed.   CULPRIT LESION: Mid RCA lesion is 90% stenosed.   A drug-eluting stent was successfully  placed using a STENT ONYX FRONTIER 3.0X22.   Post intervention, there is a 0% residual stenosis.   Hemodynamic findings consistent with mild pulmonary hypertension.  (PAP 49/24-33 mmHg, PCWP 24 mmHg transpulmonary Gradient 9 mmHg); LVEDP 16 mmHg.  4: COPD (managed by pulmonology)- - continue albuterol  inhaler PRN; she estimates that she uses this ~ once / month - continue breztri   - saw pulmonology Alica) 04/25 - wearing oxygen  at 2L around the clock  5: Anemia (managed by hematology)- - saw hematology Dyanna) 10/25 - iron  infusion done 02/04/24 - Hg 02/04/24 was 8.6  6: Hyperlipidemia- - continue atorvastatin  40mg  daily - continue ezetimibe  10mg  daily - LDL 01/19/24 was 40 - lipo (a) 03/27/23 was 54.2  7: Tobacco use- - smoking 1.5 ppd of cigarettes - voices confirmation that she removes her oxygen  when smoking and removes herself away from the oxygen  - cessation encouraged   Return in 2 weeks, sooner if needed.   I spent 38 minutes reviewing records, interviewing/ examing patient and managing plan/ orders.   Ellouise Class, FNP-C 02/25/24

## 2024-02-25 ENCOUNTER — Ambulatory Visit: Payer: Self-pay | Admitting: Family

## 2024-02-25 ENCOUNTER — Encounter: Payer: Self-pay | Admitting: Family

## 2024-02-25 ENCOUNTER — Other Ambulatory Visit
Admission: RE | Admit: 2024-02-25 | Discharge: 2024-02-25 | Disposition: A | Discharge status: Home / Self Care | Source: Ambulatory Visit | Attending: Family | Admitting: Family

## 2024-02-25 ENCOUNTER — Ambulatory Visit: Admitting: Family

## 2024-02-25 VITALS — BP 105/38 | HR 65 | Wt 179.0 lb

## 2024-02-25 DIAGNOSIS — Z720 Tobacco use: Secondary | ICD-10-CM

## 2024-02-25 DIAGNOSIS — J449 Chronic obstructive pulmonary disease, unspecified: Secondary | ICD-10-CM

## 2024-02-25 DIAGNOSIS — I10 Essential (primary) hypertension: Secondary | ICD-10-CM | POA: Diagnosis not present

## 2024-02-25 DIAGNOSIS — D509 Iron deficiency anemia, unspecified: Secondary | ICD-10-CM

## 2024-02-25 DIAGNOSIS — I2510 Atherosclerotic heart disease of native coronary artery without angina pectoris: Secondary | ICD-10-CM | POA: Diagnosis not present

## 2024-02-25 DIAGNOSIS — I5032 Chronic diastolic (congestive) heart failure: Secondary | ICD-10-CM | POA: Diagnosis not present

## 2024-02-25 DIAGNOSIS — E785 Hyperlipidemia, unspecified: Secondary | ICD-10-CM

## 2024-02-25 DIAGNOSIS — Z79899 Other long term (current) drug therapy: Secondary | ICD-10-CM | POA: Diagnosis present

## 2024-02-25 LAB — BASIC METABOLIC PANEL WITH GFR
Anion gap: 11 (ref 5–15)
BUN: 44 mg/dL — ABNORMAL HIGH (ref 8–23)
CO2: 28 mmol/L (ref 22–32)
Calcium: 8.9 mg/dL (ref 8.9–10.3)
Chloride: 102 mmol/L (ref 98–111)
Creatinine, Ser: 2.16 mg/dL — ABNORMAL HIGH (ref 0.44–1.00)
GFR, Estimated: 24 mL/min — ABNORMAL LOW (ref >60)
Glucose, Bld: 99 mg/dL (ref 70–99)
Potassium: 4.7 mmol/L (ref 3.5–5.1)
Sodium: 141 mmol/L (ref 135–145)

## 2024-02-25 LAB — BRAIN NATRIURETIC PEPTIDE: B Natriuretic Peptide: 651.3 pg/mL — ABNORMAL HIGH (ref 0.0–100.0)

## 2024-02-25 LAB — MAGNESIUM: Magnesium: 2.6 mg/dL — ABNORMAL HIGH (ref 1.7–2.4)

## 2024-02-25 MED ORDER — METOLAZONE 2.5 MG PO TABS: ORAL_TABLET | ORAL | 3 refills | Status: DC

## 2024-02-25 NOTE — Patient Instructions (Signed)
 Medication Changes:  FOR TWO DAYS ONLY please take Metolazone  2.5mg  Today and Tomorrow, THEN RESUME your weekly dose.  Lab Work:  Go over to the [facility]. Go pass the gift shop and have your blood work completed.  We will only call you if the results are abnormal or if the provider would like to make medication changes.  No news is good news.    Follow-Up in: Please follow up with the Advanced Heart Failure Clinic in    Thank you for choosing [facility] Failure [facility].    At the Advanced Heart Failure Clinic, you and your health needs are our priority. We have a designated team specialized in the treatment of Heart Failure. This Care Team includes your primary Heart Failure Specialized Cardiologist (physician), Advanced Practice Providers (APPs- Physician Assistants and Nurse Practitioners), and Pharmacist who all work together to provide you with the care you need, when you need it.   You may see any of the following providers on your designated Care Team at your next follow up:  Dr. Toribio Fuel Dr. Ezra Shuck Dr. Ria Commander Dr. Morene Brownie Ellouise Class, FNP Jaun Bash, RPH-CPP  Please be sure to bring in all your medications bottles to every appointment.   Need to Contact Us :  If you have any questions or concerns before your next appointment please send us  a message through [facility] or call our office at [phone].    TO LEAVE A MESSAGE FOR THE NURSE SELECT OPTION 2, PLEASE LEAVE A MESSAGE INCLUDING: YOUR NAME DATE OF BIRTH CALL BACK NUMBER REASON FOR CALL**this is important as we prioritize the call backs  YOU WILL RECEIVE A CALL BACK THE SAME DAY AS LONG AS YOU CALL BEFORE 4:00 PM

## 2024-02-25 NOTE — Progress Notes (Signed)
 ReDS Vest / Clip - 02/25/24 0911       ReDS Vest / Clip   Station Marker B    Ruler Value 38    ReDS Value Range Low volume    ReDS Actual Value 33

## 2024-02-26 ENCOUNTER — Telehealth: Payer: Self-pay | Admitting: *Deleted

## 2024-02-26 NOTE — Telephone Encounter (Signed)
 Called patient per v/o Misty Adams. I explained to her that her iron  stores are very low and she will require 4 more iv venofer  treatments. Patient gave verbal understanding.  Heron will you set up the additional appointments for iv iron ? Thanks.

## 2024-03-07 ENCOUNTER — Inpatient Hospital Stay: Attending: Internal Medicine

## 2024-03-07 VITALS — BP 129/45 | HR 62 | Temp 97.9°F | Resp 20

## 2024-03-07 DIAGNOSIS — D649 Anemia, unspecified: Secondary | ICD-10-CM

## 2024-03-07 MED ORDER — IRON SUCROSE 20 MG/ML IV SOLN
200.0000 mg | Freq: Once | INTRAVENOUS | Status: AC
  Administered 2024-03-07: 200 mg via INTRAVENOUS
  Filled 2024-03-07: qty 10

## 2024-03-07 MED ORDER — SODIUM CHLORIDE 0.9% FLUSH
10.0000 mL | Freq: Once | INTRAVENOUS | Status: AC | PRN
  Administered 2024-03-07: 10 mL
  Filled 2024-03-07: qty 10

## 2024-03-09 NOTE — Progress Notes (Unsigned)
 Advanced Heart Failure Clinic Note    PCP: Cleotilde Oneil FALCON, MD  Cardiologist: [name], MD/ Loistine Sober, NP   Chief Complaint: shortness of breath   HPI:  Ms Herringshaw is a 68 y/o female with a history of COPD, HTN, hyperlipidemia, mild pHTN, GERD, subclavian artery stenosis (stented 07/2017), CKD, pulmonary HTN, CAD, iron  deficiency anemia, hypothyroidism, PVD, tobacco use and chronic heart failure.   Admitted 07/14/22 with E. coli UTI, severe sepsis, and AKI. High-sensitivity troponin rose to 56. She did not undergo cardiology evaluation. In August 2024 CT chest performed secondary to weight loss showed aortic atherosclerosis, unchanged 4 mm right apical pulmonary nodule, enlarged pulmonary trunk consistent with PAH, left adrenal adenoma.   Admitted 03/08/23 due to waking up short of breath & orthopnea after returning from a cruise. BNP 710. CXR showed heart failure. Initially placed on bipap and weaned down to nasal cannula. IV lasix  given. Hypokalemia corrected. Echocardiogram performed on 03/08/2023 showed ejection fraction 60 to 65% with grade 2 diastolic dysfunction. Symptoms improved.   Admitted 03/23/23 due to worsening of shortness of breath, leg swelling and significant weight gain. Developed hypoxia and placed on 5L nasal cannula. BNP elevated and given IV lasix . Cardiology consulted. Chest x-ray showed evidence of pulmonary edema with pleural effusion. CT shows signs consistent with pulmonary hypertension. Oxygen  weaned down to 2L. L and R heart cath 12/5 with stent placed to RCA. Hypokalemia corrected.   RHC/ LHC 03/26/23:   Prox RCA lesion is 45% stenosed.   CULPRIT LESION: Mid RCA lesion is 90% stenosed.   A drug-eluting stent was successfully placed using a STENT ONYX FRONTIER 3.0X22.   Post intervention, there is a 0% residual stenosis.   -------------------------------------   Hemodynamic findings consistent with mild pulmonary hypertension.  (PAP 49/24-33  mmHg, PCWP 24 mmHg transpulmonary Gradient 9 mmHg); LVEDP 16 mmHg.   Compensated CHF: LVEDP 16 mmHg, PCWP 24 mmHg.   Recommend uninterrupted dual antiplatelet therapy with Aspirin  81mg  daily and Clopidogrel  75mg  daily for a minimum of 12 months (ACS-Class I recommendation).  Admitted 07/06/23 with lightheadedness and multiple presyncopal episodes in preceding 24 hours leading to a fall on her lower back. Hypotensive in ED 95/45. RBC transfusion given for hemoglobin of 6.6. Was also given IV iron  infusion. No injuries noted on imaging in the ED. Given IVF for hypotension. GI consulted and will have outpatient follow-up.   Seen in [facility] 04/25 and spironolactone  12.5mg  daily was started.   Admitted 10/19/23 with progressive lower extremity swelling, weight gain, dyspnea, orthopnea, cough. Had doubled her torsemide  at home without any improvement. On admission, BNP was 851.2, HS-troponin was 8, and iron  studies pending. Chest x-ray noted central vascular congestion with small left effusion. IV diuresed.   Had called [facility] 10/25/23 with weight gain and had diuretic increased.   She presents for a HF follow-up visit with a chief complaint of worsening shortness of breath. Has associated fatigue, pedal edema, rare palpitations, occasional dizziness. She has been experiencing cramping in her hands / feet.  Hasn't had metolazone  for the last 2 weeks due to not being able to refill it. She says that her pharmacy told her that it couldn't be filled until December.   Went to the beach end of September and SOB has worsened since her return. Admits to dietary indiscretion while at the beach. Continues to smoke 1.5 ppd. Does take her oxygen  off when smoking and removes herself from close proximity of it.   ROS: All  systems negative except as listed in HPI, PMH and Problem List.  SH:  Social History   Socioeconomic History   Marital status: Divorced    Spouse name: Not on file   Number of children: 5   Years of  education: Not on file   Highest education level: Associate degree: academic program  Occupational History   Occupation: Retired  Tobacco Use   Smoking status: Every Day    Current packs/day: 1.00    Average packs/day: 1 pack/day for 60.0 years (60.0 ttl pk-yrs)    Types: Cigarettes   Smokeless tobacco: Never  Vaping Use   Vaping status: Former   Substances: Nicotine  Substance and Sexual Activity   Alcohol use: Not Currently   Drug use: Not Currently   Sexual activity: Not Currently  Other Topics Concern   Not on file  Social History Narrative   Lives w/ 3 of her children   Social Drivers of Health   Financial Resource Strain: High Risk (05/07/2023)   Received from [facility]   Overall Financial Resource Strain (CARDIA)    Difficulty of Paying Living Expenses: Hard  Food Insecurity: No Food Insecurity (10/20/2023)   Hunger Vital Sign    Worried About Running Out of Food in the Last Year: Never true    Ran Out of Food in the Last Year: Never true  Transportation Needs: No Transportation Needs (10/20/2023)   PRAPARE - Administrator, Civil Service (Medical): No    Lack of Transportation (Non-Medical): No  Physical Activity: Not on file  Stress: Not on file  Social Connections: Moderately Isolated (10/20/2023)   Social Connection and Isolation Panel    Frequency of Communication with Friends and Family: More than three times a week    Frequency of Social Gatherings with Friends and Family: More than three times a week    Attends Religious Services: Never    Database Administrator or Organizations: No    Attends Engineer, Structural: 1 to 4 times per year    Marital Status: Divorced  Catering Manager Violence: Not At Risk (10/20/2023)   Humiliation, Afraid, Rape, and Kick questionnaire    Fear of Current or Ex-Partner: No    Emotionally Abused: No    Physically Abused: No    Sexually Abused: No    FH:  Family History  Problem Relation  Age of Onset   Uterine cancer Mother    Stroke Mother    Aneurysm Mother    COPD Mother    Heart attack Father    Coronary artery disease Father    Leukemia Father    Depression Father    Diabetes Father    Hyperlipidemia Father    Hypertension Father    Alcohol abuse Father    Cancer Father    Heart disease Father    Colon polyps Sister    Obesity Sister    Thyroid  disease Sister    COPD Sister    Lung cancer Sister    Breast cancer Maternal Aunt    Breast cancer Maternal Aunt    Kidney disease Paternal Uncle     Past Medical History:  Diagnosis Date   Allergy    Anemia 11/23   Anxiety    Blood transfusion without reported diagnosis    CHF (congestive heart failure) (HCC)    Chronic heart failure with preserved ejection fraction (HFpEF) (HCC)    a. 02/2023 Echo: EF 60-65%, no rwma, GrII DD,  nl RV size/fxn, mild LAE, mild MS (mean grad ), mod MAC, mild AS (mean grad 9.65mmHg).   CKD (chronic kidney disease), stage III (HCC)    COPD (chronic obstructive pulmonary disease) (HCC)    Coronary artery disease    Depression    Diabetes mellitus without complication (HCC)    Emphysema of lung (HCC) 2010   Hyperlipidemia    Hypertension    Hypothyroidism    Interstitial lung disease (HCC)    PAD (peripheral artery disease)    a. 07/2017 L subclavian stenosis s/p stenting.   Psoriasis    Squamous cell carcinoma of skin 08/25/2023   SCC IS,  [facility], referral sent for Mohs Dr. Corey   Substance abuse [facility])    Tobacco abuse     Current Outpatient Medications  Medication Sig Dispense Refill   ACCU-CHEK GUIDE TEST test strip      Accu-Chek Softclix Lancets lancets SMARTSIG:Lancet Topical     albuterol  (VENTOLIN  HFA) 108 (90 Base) MCG/ACT inhaler Inhale 2 puffs into the lungs every 6 (six) hours as needed for wheezing or shortness of breath. 1 each 3   ALPRAZolam  (XANAX ) 0.5 MG tablet Take 0.5 mg by mouth 2 (two) times daily as needed for anxiety.      aspirin  81 MG  tablet Take 81 mg by mouth at bedtime.      atorvastatin  (LIPITOR) 40 MG tablet Take 1 tablet (40 mg total) by mouth at bedtime. 30 tablet 0   bisacodyl (DULCOLAX) 5 MG EC tablet Take 5 mg by mouth daily as needed for moderate constipation.     bisoprolol  (ZEBETA ) 5 MG tablet TAKE 1 TABLET (5 MG TOTAL) BY MOUTH DAILY. 90 tablet 1   budeson-glycopyrrolate -formoterol  (BREZTRI  AEROSPHERE) 160-9-4.8 MCG/ACT AERO inhaler Inhale 2 puffs into the lungs 2 (two) times daily.     clopidogrel  (PLAVIX ) 75 MG tablet Take 1 tablet (75 mg total) by mouth daily with breakfast. 90 tablet 3   Cyanocobalamin  (VITAMIN B-12) 500 MCG SUBL Place under the tongue daily.     ezetimibe  (ZETIA ) 10 MG tablet TAKE 1 TABLET BY MOUTH EVERY DAY 90 tablet 1   gabapentin  (NEURONTIN ) 100 MG capsule Take 200 mg by mouth at bedtime.     JARDIANCE  10 MG TABS tablet TAKE ONE TABLET BY MOUTH DAILY 90 tablet 2   levothyroxine  (SYNTHROID , LEVOTHROID) 125 MCG tablet Take 125 mcg by mouth daily.      metolazone  (ZAROXOLYN ) 2.5 MG tablet Take 1 tab weekly and an additional dose as directed by HF CLINIC 7 tablet 3   OXYGEN  Inhale 2 L/L into the lungs at bedtime.     pantoprazole  (PROTONIX ) 40 MG tablet Take 40 mg by mouth daily.     potassium chloride  SA (KLOR-CON  M20) 20 MEQ tablet Take 2 tablets (40 mEq total) by mouth daily. And additional 20meq with metolazone      spironolactone  (ALDACTONE ) 25 MG tablet Take 1 tablet (25 mg total) by mouth daily. 30 tablet 0   temazepam (RESTORIL) 15 MG capsule Take 15 mg by mouth at bedtime as needed.     torsemide  (DEMADEX ) 20 MG tablet Take 2 tablets (40 mg total) by mouth daily.     traZODone  (DESYREL ) 100 MG tablet Take 100 mg by mouth daily as needed for sleep. (Patient taking differently: Take 100 mg by mouth at bedtime.)     venlafaxine  XR (EFFEXOR -XR) 75 MG 24 hr capsule Take 75 mg by mouth daily with breakfast.  No current facility-administered medications for this visit.   There were no  vitals filed for this visit.  Wt Readings from Last 3 Encounters:  02/25/24 179 lb (81.2 kg)  02/04/24 173 lb 12.8 oz (78.8 kg)  11/30/23 167 lb 4.8 oz (75.9 kg)   Lab Results  Component Value Date   CREATININE 2.16 (H) 02/25/2024   CREATININE 2.04 (H) 02/04/2024   CREATININE 2.01 (H) 11/30/2023    PHYSICAL EXAM:  General: Well appearing.  Cor: No JVD. Regular rhythm, rate.  Lungs: rales in bilateral lower lobes Abdomen: soft, nontender, nondistended. Extremities: 2+ pitting edema left lower leg, 1+ RLE Neuro:. Affect pleasant   ReDs reading: 33 %, normal  ECG: not done    ASSESSMENT & PLAN:  1: Ischemic heart failure with preserved ejection fraction- - cath with stent 03/26/23 - NYHA class III - fluid up with worsening symptoms and weight gain even though ReDs reading is normal  - weight up 9 pounds from last visit here 4 months ago - metolazone  2.5mg  daily X 2 doses and then resume weekly. Resumed the importance of sodium/ fluid restriction.  - Echo 03/09/23: EF 60-65% with Grade II DD, mild LAE, mild AS - continue bisoprolol  5mg  daily - continue jardiance  10mg  daily  - continue potassium 40meq QD - continue spironolactone  25mg  daily - continue torsemide  60mg  daily - not adding salt and has been looking at food labels - BNP 10/19/23 was 851.2 - BNP today  2: HTN- - BP 105/35 - saw PCP Ted) 10/25 - saw nephrology Jil) 07/25. Does not plan to return.  - BMP 02/04/24 reviewed: sodium 134, potassium 3.6, creatinine 2.04, GFR 26 - BMET, Mg today  3: CAD- - saw cardiology ([place]) 08/25 - continue clopidogrel  75mg  daily - continue ASA 81mg  daily - RHC/ LHC 03/26/23:   Prox RCA lesion is 45% stenosed.   CULPRIT LESION: Mid RCA lesion is 90% stenosed.   A drug-eluting stent was successfully placed using a STENT ONYX FRONTIER 3.0X22.   Post intervention, there is a 0% residual stenosis.   Hemodynamic findings consistent with mild pulmonary  hypertension.  (PAP 49/24-33 mmHg, PCWP 24 mmHg transpulmonary Gradient 9 mmHg); LVEDP 16 mmHg.  4: COPD (managed by pulmonology)- - continue albuterol  inhaler PRN; she estimates that she uses this ~ once / month - continue breztri   - saw pulmonology Alica) 04/25 - wearing oxygen  at 2L around the clock  5: Anemia (managed by hematology)- - saw hematology Dyanna) 10/25 - iron  infusion done 02/04/24 - Hg 02/04/24 was 8.6  6: Hyperlipidemia- - continue atorvastatin  40mg  daily - continue ezetimibe  10mg  daily - LDL 01/19/24 was 40 - lipo (a) 03/27/23 was 54.2  7: Tobacco use- - smoking 1.5 ppd of cigarettes - voices confirmation that she removes her oxygen  when smoking and removes herself away from the oxygen  - cessation encouraged   Return in 2 weeks, sooner if needed.   I spent 38 minutes reviewing records, interviewing/ examing patient and managing plan/ orders.   Ellouise Class, FNP-C 03/09/24

## 2024-03-10 ENCOUNTER — Encounter: Payer: Self-pay | Admitting: Emergency Medicine

## 2024-03-10 ENCOUNTER — Ambulatory Visit (HOSPITAL_BASED_OUTPATIENT_CLINIC_OR_DEPARTMENT_OTHER): Admitting: Family

## 2024-03-10 ENCOUNTER — Other Ambulatory Visit: Payer: Self-pay

## 2024-03-10 ENCOUNTER — Encounter: Payer: Self-pay | Admitting: Family

## 2024-03-10 ENCOUNTER — Observation Stay
Admission: EM | Admit: 2024-03-10 | Discharge: 2024-03-11 | Disposition: A | Discharge status: Home / Self Care | Source: Ambulatory Visit | Attending: Emergency Medicine | Admitting: Emergency Medicine

## 2024-03-10 ENCOUNTER — Inpatient Hospital Stay

## 2024-03-10 ENCOUNTER — Ambulatory Visit: Payer: Self-pay | Admitting: Family

## 2024-03-10 ENCOUNTER — Other Ambulatory Visit
Admission: RE | Admit: 2024-03-10 | Discharge: 2024-03-10 | Disposition: A | Discharge status: Home / Self Care | Source: Ambulatory Visit | Attending: Family | Admitting: Family

## 2024-03-10 VITALS — BP 129/41 | HR 59 | Wt 177.6 lb

## 2024-03-10 DIAGNOSIS — I5032 Chronic diastolic (congestive) heart failure: Secondary | ICD-10-CM

## 2024-03-10 DIAGNOSIS — J449 Chronic obstructive pulmonary disease, unspecified: Secondary | ICD-10-CM

## 2024-03-10 DIAGNOSIS — D500 Iron deficiency anemia secondary to blood loss (chronic): Secondary | ICD-10-CM | POA: Diagnosis not present

## 2024-03-10 DIAGNOSIS — Z23 Encounter for immunization: Secondary | ICD-10-CM | POA: Diagnosis not present

## 2024-03-10 DIAGNOSIS — Z9981 Dependence on supplemental oxygen: Secondary | ICD-10-CM

## 2024-03-10 DIAGNOSIS — I2510 Atherosclerotic heart disease of native coronary artery without angina pectoris: Secondary | ICD-10-CM | POA: Diagnosis not present

## 2024-03-10 DIAGNOSIS — E785 Hyperlipidemia, unspecified: Secondary | ICD-10-CM

## 2024-03-10 DIAGNOSIS — D649 Anemia, unspecified: Secondary | ICD-10-CM | POA: Insufficient documentation

## 2024-03-10 DIAGNOSIS — J9611 Chronic respiratory failure with hypoxia: Secondary | ICD-10-CM | POA: Insufficient documentation

## 2024-03-10 DIAGNOSIS — F411 Generalized anxiety disorder: Secondary | ICD-10-CM | POA: Diagnosis not present

## 2024-03-10 DIAGNOSIS — N184 Chronic kidney disease, stage 4 (severe): Secondary | ICD-10-CM | POA: Diagnosis not present

## 2024-03-10 DIAGNOSIS — I130 Hypertensive heart and chronic kidney disease with heart failure and stage 1 through stage 4 chronic kidney disease, or unspecified chronic kidney disease: Secondary | ICD-10-CM | POA: Insufficient documentation

## 2024-03-10 DIAGNOSIS — I10 Essential (primary) hypertension: Secondary | ICD-10-CM

## 2024-03-10 DIAGNOSIS — Z7982 Long term (current) use of aspirin: Secondary | ICD-10-CM | POA: Diagnosis not present

## 2024-03-10 DIAGNOSIS — I739 Peripheral vascular disease, unspecified: Secondary | ICD-10-CM | POA: Diagnosis not present

## 2024-03-10 DIAGNOSIS — Z79899 Other long term (current) drug therapy: Secondary | ICD-10-CM | POA: Insufficient documentation

## 2024-03-10 DIAGNOSIS — E039 Hypothyroidism, unspecified: Secondary | ICD-10-CM | POA: Diagnosis not present

## 2024-03-10 DIAGNOSIS — R7989 Other specified abnormal findings of blood chemistry: Secondary | ICD-10-CM | POA: Diagnosis present

## 2024-03-10 DIAGNOSIS — E1122 Type 2 diabetes mellitus with diabetic chronic kidney disease: Secondary | ICD-10-CM | POA: Insufficient documentation

## 2024-03-10 DIAGNOSIS — Z8679 Personal history of other diseases of the circulatory system: Secondary | ICD-10-CM

## 2024-03-10 DIAGNOSIS — Z720 Tobacco use: Secondary | ICD-10-CM

## 2024-03-10 DIAGNOSIS — R195 Other fecal abnormalities: Secondary | ICD-10-CM

## 2024-03-10 DIAGNOSIS — Z794 Long term (current) use of insulin: Secondary | ICD-10-CM | POA: Insufficient documentation

## 2024-03-10 DIAGNOSIS — N1832 Chronic kidney disease, stage 3b: Secondary | ICD-10-CM | POA: Diagnosis present

## 2024-03-10 DIAGNOSIS — G894 Chronic pain syndrome: Secondary | ICD-10-CM | POA: Insufficient documentation

## 2024-03-10 DIAGNOSIS — E1151 Type 2 diabetes mellitus with diabetic peripheral angiopathy without gangrene: Secondary | ICD-10-CM | POA: Diagnosis present

## 2024-03-10 LAB — PREPARE RBC (CROSSMATCH)

## 2024-03-10 LAB — CBC
HCT: 24 % — ABNORMAL LOW (ref 36.0–46.0)
HCT: 25.2 % — ABNORMAL LOW (ref 36.0–46.0)
Hemoglobin: 6.4 g/dL — ABNORMAL LOW (ref 12.0–15.0)
Hemoglobin: 6.6 g/dL — ABNORMAL LOW (ref 12.0–15.0)
MCH: 20.9 pg — ABNORMAL LOW (ref 26.0–34.0)
MCH: 20.8 pg — ABNORMAL LOW (ref 26.0–34.0)
MCHC: 26.7 g/dL — ABNORMAL LOW (ref 30.0–36.0)
MCHC: 26.2 g/dL — ABNORMAL LOW (ref 30.0–36.0)
MCV: 78.4 fL — ABNORMAL LOW (ref 80.0–100.0)
MCV: 79.2 fL — ABNORMAL LOW (ref 80.0–100.0)
Platelets: 373 K/uL (ref 150–400)
Platelets: 391 K/uL (ref 150–400)
RBC: 3.06 MIL/uL — ABNORMAL LOW (ref 3.87–5.11)
RBC: 3.18 MIL/uL — ABNORMAL LOW (ref 3.87–5.11)
RDW: 23 % — ABNORMAL HIGH (ref 11.5–15.5)
RDW: 23.6 % — ABNORMAL HIGH (ref 11.5–15.5)
WBC: 10.1 K/uL (ref 4.0–10.5)
WBC: 11.8 K/uL — ABNORMAL HIGH (ref 4.0–10.5)
nRBC: 0.3 % — ABNORMAL HIGH (ref 0.0–0.2)
nRBC: 0.3 % — ABNORMAL HIGH (ref 0.0–0.2)

## 2024-03-10 LAB — COMPREHENSIVE METABOLIC PANEL WITH GFR
ALT: 7 U/L (ref 0–44)
AST: 10 U/L — ABNORMAL LOW (ref 15–41)
Albumin: 3.9 g/dL (ref 3.5–5.0)
Alkaline Phosphatase: 88 U/L (ref 38–126)
Anion gap: 12 (ref 5–15)
BUN: 46 mg/dL — ABNORMAL HIGH (ref 8–23)
CO2: 29 mmol/L (ref 22–32)
Calcium: 9.5 mg/dL (ref 8.9–10.3)
Chloride: 98 mmol/L (ref 98–111)
Creatinine, Ser: 2.5 mg/dL — ABNORMAL HIGH (ref 0.44–1.00)
GFR, Estimated: 20 mL/min — ABNORMAL LOW (ref >60)
Glucose, Bld: 93 mg/dL (ref 70–99)
Potassium: 3.8 mmol/L (ref 3.5–5.1)
Sodium: 139 mmol/L (ref 135–145)
Total Bilirubin: 0.3 mg/dL (ref 0.0–1.2)
Total Protein: 6.8 g/dL (ref 6.5–8.1)

## 2024-03-10 LAB — BASIC METABOLIC PANEL WITH GFR
Anion gap: 11 (ref 5–15)
BUN: 48 mg/dL — ABNORMAL HIGH (ref 8–23)
CO2: 30 mmol/L (ref 22–32)
Calcium: 9.4 mg/dL (ref 8.9–10.3)
Chloride: 98 mmol/L (ref 98–111)
Creatinine, Ser: 2.31 mg/dL — ABNORMAL HIGH (ref 0.44–1.00)
GFR, Estimated: 22 mL/min — ABNORMAL LOW (ref >60)
Glucose, Bld: 112 mg/dL — ABNORMAL HIGH (ref 70–99)
Potassium: 4.2 mmol/L (ref 3.5–5.1)
Sodium: 138 mmol/L (ref 135–145)

## 2024-03-10 LAB — PRO BRAIN NATRIURETIC PEPTIDE: Pro Brain Natriuretic Peptide: 1226 pg/mL — ABNORMAL HIGH (ref <300.0)

## 2024-03-10 LAB — MAGNESIUM: Magnesium: 2.6 mg/dL — ABNORMAL HIGH (ref 1.7–2.4)

## 2024-03-10 MED ORDER — SODIUM CHLORIDE 0.9 % IV SOLN: Freq: Once | INTRAVENOUS | Status: DC

## 2024-03-10 MED ORDER — PANTOPRAZOLE SODIUM 40 MG IV SOLR
40.0000 mg | Freq: Two times a day (BID) | INTRAVENOUS | Status: DC
  Administered 2024-03-10 – 2024-03-11 (×2): 40 mg via INTRAVENOUS
  Filled 2024-03-10 (×2): qty 10

## 2024-03-10 MED ORDER — BISACODYL 5 MG PO TBEC: 5.0000 mg | DELAYED_RELEASE_TABLET | Freq: Every day | ORAL | Status: DC | PRN

## 2024-03-10 MED ORDER — ATORVASTATIN CALCIUM 20 MG PO TABS
40.0000 mg | ORAL_TABLET | Freq: Every day | ORAL | Status: DC
  Administered 2024-03-10: 40 mg via ORAL
  Filled 2024-03-10: qty 2

## 2024-03-10 MED ORDER — SODIUM CHLORIDE 0.9% FLUSH
3.0000 mL | Freq: Two times a day (BID) | INTRAVENOUS | Status: DC
  Administered 2024-03-10 – 2024-03-11 (×2): 3 mL via INTRAVENOUS

## 2024-03-10 MED ORDER — POTASSIUM CHLORIDE CRYS ER 20 MEQ PO TBCR
40.0000 meq | EXTENDED_RELEASE_TABLET | Freq: Every day | ORAL | Status: DC
  Administered 2024-03-11: 40 meq via ORAL
  Filled 2024-03-10: qty 2

## 2024-03-10 MED ORDER — TRAZODONE HCL 100 MG PO TABS
100.0000 mg | ORAL_TABLET | Freq: Every day | ORAL | Status: DC
  Administered 2024-03-10: 100 mg via ORAL
  Filled 2024-03-10: qty 1

## 2024-03-10 MED ORDER — BUDESON-GLYCOPYRROL-FORMOTEROL 160-9-4.8 MCG/ACT IN AERO
2.0000 | INHALATION_SPRAY | Freq: Two times a day (BID) | RESPIRATORY_TRACT | Status: DC
  Administered 2024-03-10 – 2024-03-11 (×2): 2 via RESPIRATORY_TRACT
  Filled 2024-03-10: qty 5.9

## 2024-03-10 MED ORDER — INFLUENZA VAC SPLIT HIGH-DOSE 0.5 ML IM SUSY
0.5000 mL | PREFILLED_SYRINGE | INTRAMUSCULAR | Status: AC
  Administered 2024-03-11: 0.5 mL via INTRAMUSCULAR
  Filled 2024-03-10: qty 0.5

## 2024-03-10 MED ORDER — SODIUM CHLORIDE 0.9 % IV SOLN
250.0000 mL | INTRAVENOUS | Status: DC | PRN
Start: 2024-03-10 — End: 2024-03-11

## 2024-03-10 MED ORDER — FUROSEMIDE 10 MG/ML IJ SOLN: 60.0000 mg | Freq: Once | INTRAMUSCULAR | Status: DC

## 2024-03-10 MED ORDER — LEVOTHYROXINE SODIUM 50 MCG PO TABS
125.0000 ug | ORAL_TABLET | Freq: Every day | ORAL | Status: DC
  Administered 2024-03-11: 125 ug via ORAL
  Filled 2024-03-10: qty 3

## 2024-03-10 MED ORDER — BISOPROLOL FUMARATE 5 MG PO TABS
5.0000 mg | ORAL_TABLET | Freq: Every day | ORAL | Status: DC
  Administered 2024-03-11: 5 mg via ORAL
  Filled 2024-03-10: qty 1

## 2024-03-10 MED ORDER — EMPAGLIFLOZIN 10 MG PO TABS
10.0000 mg | ORAL_TABLET | Freq: Every day | ORAL | Status: DC
  Administered 2024-03-11: 10 mg via ORAL
  Filled 2024-03-10: qty 1

## 2024-03-10 MED ORDER — VENLAFAXINE HCL ER 75 MG PO CP24
75.0000 mg | ORAL_CAPSULE | Freq: Every day | ORAL | Status: DC
  Administered 2024-03-11: 75 mg via ORAL
  Filled 2024-03-10: qty 1

## 2024-03-10 MED ORDER — ALPRAZOLAM 0.5 MG PO TABS: 0.5000 mg | ORAL_TABLET | Freq: Two times a day (BID) | ORAL | Status: DC | PRN

## 2024-03-10 MED ORDER — SODIUM CHLORIDE 0.9% FLUSH
3.0000 mL | INTRAVENOUS | Status: DC | PRN
Start: 2024-03-10 — End: 2024-03-11

## 2024-03-10 MED ORDER — PNEUMOCOCCAL 20-VAL CONJ VACC 0.5 ML IM SUSY
0.5000 mL | PREFILLED_SYRINGE | INTRAMUSCULAR | Status: AC
  Administered 2024-03-11: 0.5 mL via INTRAMUSCULAR
  Filled 2024-03-10 (×2): qty 0.5

## 2024-03-10 MED ORDER — SPIRONOLACTONE 25 MG PO TABS
25.0000 mg | ORAL_TABLET | Freq: Every day | ORAL | Status: DC
  Administered 2024-03-11: 25 mg via ORAL
  Filled 2024-03-10: qty 1

## 2024-03-10 MED ORDER — TEMAZEPAM 7.5 MG PO CAPS
15.0000 mg | ORAL_CAPSULE | Freq: Every evening | ORAL | Status: DC | PRN
  Administered 2024-03-10: 15 mg via ORAL
  Filled 2024-03-10: qty 2
  Filled 2024-03-10: qty 1

## 2024-03-10 MED ORDER — TORSEMIDE 20 MG PO TABS: 60.0000 mg | ORAL_TABLET | Freq: Every day | ORAL | 3 refills | Status: DC

## 2024-03-10 MED ORDER — EZETIMIBE 10 MG PO TABS
10.0000 mg | ORAL_TABLET | Freq: Every day | ORAL | Status: DC
  Administered 2024-03-11: 10 mg via ORAL
  Filled 2024-03-10: qty 1

## 2024-03-10 MED ORDER — GABAPENTIN 100 MG PO CAPS
200.0000 mg | ORAL_CAPSULE | Freq: Every day | ORAL | Status: DC
  Administered 2024-03-10: 200 mg via ORAL
  Filled 2024-03-10: qty 2

## 2024-03-10 MED ORDER — ALBUTEROL SULFATE (2.5 MG/3ML) 0.083% IN NEBU: 3.0000 mL | INHALATION_SOLUTION | Freq: Four times a day (QID) | RESPIRATORY_TRACT | Status: DC | PRN

## 2024-03-10 MED ORDER — OFLOXACIN 0.3 % OP SOLN: 1.0000 [drp] | Freq: Four times a day (QID) | OPHTHALMIC | Status: DC

## 2024-03-10 MED ORDER — FUROSEMIDE 10 MG/ML IJ SOLN
60.0000 mg | Freq: Once | INTRAMUSCULAR | Status: AC
  Administered 2024-03-10: 60 mg via INTRAVENOUS
  Filled 2024-03-10: qty 8

## 2024-03-10 NOTE — Telephone Encounter (Signed)
 Called pt about critical hgb of 6.4. per Ellouise Class, FNP pt to report to ER. Pt agreeable. No further questions at this time.

## 2024-03-10 NOTE — Patient Instructions (Addendum)
 Medication Changes:  INCREASE Torsemide  60mg  daily  TAKE 1 extra Metolazone  today only  Lab Work:  Go over to the [facility]. Go pass the gift shop and have your blood work completed.   We will only call you if the results are abnormal or if the provider would like to make medication changes.  No news is good news.    Follow-Up in: Please follow up with the Advanced Heart Failure Clinic in 1 month with Ellouise Class, FNP.   Thank you for choosing [facility].    At the Advanced Heart Failure Clinic, you and your health needs are our priority. We have a designated team specialized in the treatment of Heart Failure. This Care Team includes your primary Heart Failure Specialized Cardiologist (physician), Advanced Practice Providers (APPs- Physician Assistants and Nurse Practitioners), and Pharmacist who all work together to provide you with the care you need, when you need it.   You may see any of the following providers on your designated Care Team at your next follow up:  Dr. Toribio Fuel Dr. Ezra Shuck Dr. Ria Commander Dr. Morene Brownie Ellouise Class, FNP Jaun Bash, RPH-CPP  Please be sure to bring in all your medications bottles to every appointment.   Need to Contact Us :  If you have any questions or concerns before your next appointment please send us  a message through [place] or call our office at [phone].    TO LEAVE A MESSAGE FOR THE NURSE SELECT OPTION 2, PLEASE LEAVE A MESSAGE INCLUDING: YOUR NAME DATE OF BIRTH CALL BACK NUMBER REASON FOR CALL**this is important as we prioritize the call backs  YOU WILL RECEIVE A CALL BACK THE SAME DAY AS LONG AS YOU CALL BEFORE 4:00 PM

## 2024-03-10 NOTE — H&P (Signed)
 History and Physical    Misty Adams Misty Adams DOB: 1955/05/30 DOA: 03/10/2024  PCP: Cleotilde Oneil FALCON, MD  Patient coming from: clinic   Chief Complaint: abnormal lab  HPI: Misty Adams is a 68 y.o. female with medical history significant for hfpef, chornic o2, ckd 4, cad s/p stent, dm, htn, hypothyroid, iron  deficiency anemia, presenting for the above.  Went to [facility] clinic today complaining of several weeks dyspnea and fatigue. Has had intermittent dark stools for some time, no other bleeding. Has had some leg swelling. No chest pain. Stable chronic cough, no fevers. Last plavix  this morning. Called when hgb returned in 6s told to go to ER.   Review of Systems: As per HPI otherwise 10 point review of systems negative.    Past Medical History:  Diagnosis Date   Allergy    Anemia 11/23   Anxiety    Blood transfusion without reported diagnosis    CHF (congestive heart failure) (HCC)    Chronic heart failure with preserved ejection fraction (HFpEF) (HCC)    a. 02/2023 Echo: EF 60-65%, no rwma, GrII DD, nl RV size/fxn, mild LAE, mild MS (mean grad ), mod MAC, mild AS (mean grad 9.34mmHg).   CKD (chronic kidney disease), stage III (HCC)    COPD (chronic obstructive pulmonary disease) (HCC)    Coronary artery disease    Depression    Diabetes mellitus without complication (HCC)    Emphysema of lung (HCC) 2010   Hyperlipidemia    Hypertension    Hypothyroidism    Interstitial lung disease (HCC)    PAD (peripheral artery disease)    a. 07/2017 L subclavian stenosis s/p stenting.   Psoriasis    Squamous cell carcinoma of skin 08/25/2023   SCC IS,  [facility], referral sent for Mohs Dr. Corey   Substance abuse [facility])    Tobacco abuse     Past Surgical History:  Procedure Laterality Date   ABDOMINAL HYSTERECTOMY     AORTIC ARCH ANGIOGRAPHY N/A 08/06/2017   Procedure: AORTIC ARCH ANGIOGRAPHY;  Surgeon: Laurence Redell CROME, MD;  Location: [facility] INVASIVE CV LAB;  Service:  Cardiovascular;  Laterality: N/A;   APPENDECTOMY     BIOPSY  12/19/2022   Procedure: BIOPSY;  Surgeon: Onita Elspeth Sharper, DO;  Location: [facility] ENDOSCOPY;  Service: Gastroenterology;;   CESAREAN SECTION     COLONOSCOPY     COLONOSCOPY WITH PROPOFOL  N/A 12/07/2020   Procedure: COLONOSCOPY WITH PROPOFOL ;  Surgeon: Onita Elspeth Sharper, DO;  Location: [facility] ENDOSCOPY;  Service: Gastroenterology;  Laterality: N/A;   COLONOSCOPY WITH PROPOFOL  N/A 12/19/2022   Procedure: COLONOSCOPY WITH PROPOFOL ;  Surgeon: Onita Elspeth Sharper, DO;  Location: [facility] ENDOSCOPY;  Service: Gastroenterology;  Laterality: N/A;   CORONARY STENT INTERVENTION N/A 03/26/2023   Procedure: CORONARY STENT INTERVENTION;  Surgeon: Anner Alm ORN, MD;  Location: [facility] INVASIVE CV LAB;  Service: Cardiovascular;  Laterality: N/A;   dilatation and curettage     DILATION AND CURETTAGE OF UTERUS     ESOPHAGOGASTRODUODENOSCOPY N/A 12/07/2020   Procedure: ESOPHAGOGASTRODUODENOSCOPY (EGD);  Surgeon: Onita Elspeth Sharper, DO;  Location: [facility] ENDOSCOPY;  Service: Gastroenterology;  Laterality: N/A;   ESOPHAGOGASTRODUODENOSCOPY (EGD) WITH PROPOFOL  N/A 12/19/2022   Procedure: ESOPHAGOGASTRODUODENOSCOPY (EGD) WITH PROPOFOL ;  Surgeon: Onita Elspeth Sharper, DO;  Location: [facility] ENDOSCOPY;  Service: Gastroenterology;  Laterality: N/A;   KNEE SURGERY     left wrist ganglion cyst removal     OOPHORECTOMY  1994   PERIPHERAL VASCULAR INTERVENTION Left 08/06/2017  Procedure: PERIPHERAL VASCULAR INTERVENTION;  Surgeon: Laurence Redell CROME, MD;  Location: [facility] INVASIVE CV LAB;  Service: Cardiovascular;  Laterality: Left;  subclavian   POLYPECTOMY  12/19/2022   Procedure: POLYPECTOMY;  Surgeon: Onita Elspeth Sharper, DO;  Location: [facility] ENDOSCOPY;  Service: Gastroenterology;;   RIGHT/LEFT HEART CATH AND CORONARY ANGIOGRAPHY N/A 03/26/2023   Procedure: RIGHT/LEFT HEART CATH AND CORONARY ANGIOGRAPHY;  Surgeon: Anner Alm ORN, MD;  Location: [facility] INVASIVE CV LAB;   Service: Cardiovascular;  Laterality: N/A;   TUBAL LIGATION       reports that she has been smoking cigarettes. She has a 60 pack-year smoking history. She has never used smokeless tobacco. She reports that she does not currently use alcohol. She reports that she does not currently use drugs.  Allergies  Allergen Reactions   Hydrocodone -Acetaminophen  Hives   Oxycodone  Nausea Only    GI upset, felt faint-per pt   Vicodin [Hydrocodone -Acetaminophen ] Hives   Trelegy Ellipta [Fluticasone-Umeclidin-Vilant] Rash    Changed to Budeson-Glycopyrrol-Formoterol  by provider    Family History  Problem Relation Age of Onset   Uterine cancer Mother    Stroke Mother    Aneurysm Mother    COPD Mother    Heart attack Father    Coronary artery disease Father    Leukemia Father    Depression Father    Diabetes Father    Hyperlipidemia Father    Hypertension Father    Alcohol abuse Father    Cancer Father    Heart disease Father    Colon polyps Sister    Obesity Sister    Thyroid  disease Sister    COPD Sister    Lung cancer Sister    Breast cancer Maternal Aunt    Breast cancer Maternal Aunt    Kidney disease Paternal Uncle     Prior to Admission medications   Medication Sig Start Date End Date Taking? Authorizing Provider  ofloxacin (OCUFLOX) 0.3 % ophthalmic solution Place 1 drop into the left eye 4 (four) times daily. For one week, then stop 02/10/24  Yes [provider]  ofloxacin (OCUFLOX) 0.3 % ophthalmic solution Place 1 drop into the right eye 4 (four) times daily. 02/23/24  Yes [provider]  ACCU-CHEK GUIDE TEST test strip  04/05/23   [provider]  Accu-Chek Softclix Lancets lancets SMARTSIG:Lancet Topical 04/05/23   [provider]  albuterol  (VENTOLIN  HFA) 108 (90 Base) MCG/ACT inhaler Inhale 2 puffs into the lungs every 6 (six) hours as needed for wheezing or shortness of breath. 03/28/23   Patel, Sona, MD  ALPRAZolam  (XANAX ) 0.5 MG  tablet Take 0.5 mg by mouth 2 (two) times daily as needed for anxiety.  01/16/17   [provider]  aspirin  81 MG tablet Take 81 mg by mouth at bedtime.     [provider]  atorvastatin  (LIPITOR) 40 MG tablet Take 1 tablet (40 mg total) by mouth at bedtime. 07/14/17   Judd Betters, MD  bisacodyl (DULCOLAX) 5 MG EC tablet Take 5 mg by mouth daily as needed for moderate constipation.    [provider]  bisoprolol  (ZEBETA ) 5 MG tablet TAKE 1 TABLET (5 MG TOTAL) BY MOUTH DAILY. 11/17/23   Donette Ellouise LABOR, FNP  budeson-glycopyrrolate -formoterol  (BREZTRI  AEROSPHERE) 160-9-4.8 MCG/ACT AERO inhaler Inhale 2 puffs into the lungs 2 (two) times daily.    [provider]  Cholecalciferol  (VITAMIN D3) 75 MCG (3000 UT) TABS Take by mouth once a week.    [provider]  clopidogrel  (  PLAVIX ) 75 MG tablet Take 1 tablet (75 mg total) by mouth daily with breakfast. 06/11/23   Donette Ellouise LABOR, FNP  Cyanocobalamin  (VITAMIN B-12) 500 MCG SUBL Place under the tongue daily.    [provider]  ezetimibe  (ZETIA ) 10 MG tablet TAKE 1 TABLET BY MOUTH EVERY DAY 11/17/23   Donette Ellouise A, FNP  gabapentin  (NEURONTIN ) 100 MG capsule Take 200 mg by mouth at bedtime.    [provider]  JARDIANCE  10 MG TABS tablet TAKE ONE TABLET BY MOUTH DAILY 01/27/24   Donette Ellouise A, FNP  levothyroxine  (SYNTHROID , LEVOTHROID) 125 MCG tablet Take 125 mcg by mouth daily.     [provider]  metolazone  (ZAROXOLYN ) 2.5 MG tablet Take 1 tab weekly and an additional dose as directed by HF CLINIC 02/25/24   Donette Ellouise A, FNP  OXYGEN  Inhale 2 L/L into the lungs at bedtime.    [provider]  pantoprazole  (PROTONIX ) 40 MG tablet Take 40 mg by mouth daily.    [provider]  potassium chloride  SA (KLOR-CON  M20) 20 MEQ tablet Take 2 tablets (40 mEq total) by mouth daily. And additional with metolazone  11/01/23 03/10/24  Donette Ellouise LABOR, FNP  spironolactone   (ALDACTONE ) 25 MG tablet Take 1 tablet (25 mg total) by mouth daily. 10/23/23   Shah, Vipul, MD  temazepam (RESTORIL) 15 MG capsule Take 15 mg by mouth at bedtime as needed. 10/22/23   [provider]  torsemide  (DEMADEX ) 20 MG tablet Take 3 tablets (60 mg total) by mouth daily. 03/10/24   Donette Ellouise LABOR, FNP  traZODone  (DESYREL ) 100 MG tablet Take 100 mg by mouth daily as needed for sleep. Patient taking differently: Take 100 mg by mouth at bedtime. 08/31/20   [provider]  venlafaxine  XR (EFFEXOR -XR) 75 MG 24 hr capsule Take 75 mg by mouth daily with breakfast. 07/15/23 07/14/24  [provider]    Physical Exam: Vitals:   03/10/24 1404 03/10/24 1405 03/10/24 1530 03/10/24 1608  BP: (!) 129/36  (!) 137/45 (!) 135/43  Pulse: 67  64 62  Resp: 18  20 20   Temp: 97.8 F (36.6 C)   98.4 F (36.9 C)  TempSrc:    Oral  SpO2: 94%  100%   Weight:  80.3 kg    Height:  5' 4 (1.626 m)      Constitutional: No acute distress Head: Atraumatic Eyes: Conjunctiva clear ENM: Moist mucous membranes. Normal dentition.  Neck: Supple Respiratory: few rhonchi, rales at bases Cardiovascular: Regular rate and rhythm. No murmurs/rubs/gallops. Abdomen: Non-tender, non-distended.   Musculoskeletal: No joint deformity upper and lower extremities. Normal ROM, no contractures. Normal muscle tone.  Skin: No rashes, lesions, or ulcers.  Extremities: mild LE edema.   Neurologic: Alert, moving all 4 extremities. Psychiatric: Normal insight and judgement.   Labs on Admission: I have personally reviewed following labs and imaging studies  CBC: Recent Labs  Lab 03/10/24 1155 03/10/24 1535  WBC 10.1 11.8*  HGB 6.4* 6.6*  HCT 24.0* 25.2*  MCV 78.4* 79.2*  PLT 373 391   Basic Metabolic Panel: Recent Labs  Lab 03/10/24 1155 03/10/24 1535  NA 138 139  K 4.2 3.8  CL 98 98  CO2 30 29  GLUCOSE 112* 93  BUN 48* 46*  CREATININE 2.31* 2.50*  CALCIUM  9.4 9.5  MG 2.6*  --     GFR: Estimated Creatinine Clearance: 21.5 mL/min (A) (by C-G formula based on SCr of 2.5 mg/dL (H)). Liver  Function Tests: Recent Labs  Lab 03/10/24 1535  AST 10*  ALT 7  ALKPHOS 88  BILITOT 0.3  PROT 6.8  ALBUMIN  3.9   No results for input(s): LIPASE, AMYLASE in the last 168 hours. No results for input(s): AMMONIA in the last 168 hours. Coagulation Profile: No results for input(s): INR, PROTIME in the last 168 hours. Cardiac Enzymes: No results for input(s): CKTOTAL, CKMB, CKMBINDEX, TROPONINI in the last 168 hours. BNP (last 3 results) Recent Labs    03/10/24 1155  PROBNP 1,226.0*   HbA1C: No results for input(s): HGBA1C in the last 72 hours. CBG: No results for input(s): GLUCAP in the last 168 hours. Lipid Profile: No results for input(s): CHOL, HDL, LDLCALC, TRIG, CHOLHDL, LDLDIRECT in the last 72 hours. Thyroid  Function Tests: No results for input(s): TSH, T4TOTAL, FREET4, T3FREE, THYROIDAB in the last 72 hours. Anemia Panel: No results for input(s): VITAMINB12, FOLATE, FERRITIN, TIBC, IRON , RETICCTPCT in the last 72 hours. Urine analysis:    Component Value Date/Time   COLORURINE STRAW (A) 07/07/2023 0035   APPEARANCEUR CLEAR (A) 07/07/2023 0035   LABSPEC 1.009 07/07/2023 0035   PHURINE 6.0 07/07/2023 0035   GLUCOSEU 50 (A) 07/07/2023 0035   HGBUR NEGATIVE 07/07/2023 0035   BILIRUBINUR NEGATIVE 07/07/2023 0035   KETONESUR NEGATIVE 07/07/2023 0035   PROTEINUR NEGATIVE 07/07/2023 0035   NITRITE NEGATIVE 07/07/2023 0035   LEUKOCYTESUR NEGATIVE 07/07/2023 0035    Radiological Exams on Admission: No results found.  EKG: pending  Assessment/Plan Principal Problem:   Anemia Active Problems:   Chronic heart failure with preserved ejection fraction (HFpEF) (HCC)   Hypothyroidism   Dependence on nocturnal oxygen  therapy   Essential hypertension   Chronic kidney disease, stage 3b (HCC)   CAD in  native artery   Diabetes mellitus with peripheral vascular disease (HCC)   PAD (peripheral artery disease)   # Iron  deficiency anemia On asa and plavix , RCA stent 12/24. Duodenal ulcer and multiple colonic polyps on endoluminal eval 8/24, plan was 6 mo repeat colonoscopy given piecemeal resection of one polyp, deferred given need for 12 months plavix . 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 Followed by heme, gets regular iron , currently weekly. Baseline hgb 8s, here 6s - PPI - hold asa/plavix  - will touch base w/ GI - 2 units PRBCs ordered  # HFpEF with acute exacerbation TTE 02/2023 showed preserved EF, grade 2 DD, mild mitral stenosis, mild aortic stenosis. BNP elevated, seen in chf clinic today and plan was to escalate diuresis - home torsemide  60 held - will give lasix  60 IV after first unit of blood - cont home farxiga, lasix    # Chronic hypoxic respiratory failure On 2 liters o2 at baseline, here stable on that - [place] O2    # CAD With DES to RCA 12/24. No chest pain - hold asa/plavix  - cont home statin, zetia    # COPD Do not think this is an acute exacerbation - home breztri    # Chronic pain - home gabapentin    # T2DM Well controlled - monitor daily fasting sugars - home jardiance    # GAD - home xanax , effexor    # Hypothyroid - home synthroid    # CKD 4 Baseline cr around 2 here 2.3 but per cardiology plan to diurese - monitor closely while diuresing  DVT prophylaxis: SCDs Code Status: full  Family Communication: daughter at bedside  Consults called: none thus far   Level of care: Med-Surg Status is: Inpatient Remains inpatient appropriate  because: need for blood transfusion and monitoring    Devaughn KATHEE Ban MD [facility] Pager [phone]  If 7PM-7AM, please contact night-coverage [email or web address] Password [email or web address]  03/10/2024, 4:18 PM

## 2024-03-10 NOTE — ED Triage Notes (Signed)
 Pt to ER states she was sent by the Heart failure clinic with Hgb of 6.4.  Pt states has been feeling weak for approximately 1 1/2 weeks.  States she requested the CBC at clinic today.  Has had previous transfusion.

## 2024-03-10 NOTE — ED Notes (Signed)
 Called CCMD to place pt on cental monitoring

## 2024-03-10 NOTE — ED Provider Notes (Signed)
 [facility] Provider Note    Event Date/Time   First MD Initiated Contact with Patient 03/10/24 1503     (approximate)   History   abnormal labs   HPI  Misty Adams is a 68 y.o. female with a history of CHF.  Discussed with CHF nurse practitioner who referred   Having increasing dyspnea.  Found to have anemia.  Patient also having occasional dark stools with a history of AVMs.  On aspirin  and Plavix .  Patient concerned that she got iron  infusion but her blood count did not improve recently.  CHF clinic also concerned she may have AVMs that could potentially still be bleeding.  She has anemia that seems to be worsening, and they also think potential multifactorial with potential CHF   Past Medical History:  Diagnosis Date   Allergy    Anemia 11/23   Anxiety    Blood transfusion without reported diagnosis    CHF (congestive heart failure) (HCC)    Chronic heart failure with preserved ejection fraction (HFpEF) (HCC)    a. 02/2023 Echo: EF 60-65%, no rwma, GrII DD, nl RV size/fxn, mild LAE, mild MS (mean grad ), mod MAC, mild AS (mean grad 9.38mmHg).   CKD (chronic kidney disease), stage III (HCC)    COPD (chronic obstructive pulmonary disease) (HCC)    Coronary artery disease    Depression    Diabetes mellitus without complication (HCC)    Emphysema of lung (HCC) 2010   Hyperlipidemia    Hypertension    Hypothyroidism    Interstitial lung disease (HCC)    PAD (peripheral artery disease)    a. 07/2017 L subclavian stenosis s/p stenting.   Psoriasis    Squamous cell carcinoma of skin 08/25/2023   SCC IS,  [facility], referral sent for Mohs Dr. Corey   Substance abuse [facility])    Tobacco abuse      Physical Exam   Triage Vital Signs: ED Triage Vitals  Encounter Vitals Group     BP 03/10/24 1404 (!) 129/36     Girls Systolic BP Percentile --      Girls Diastolic BP Percentile --      Boys Systolic BP Percentile --      Boys Diastolic  BP Percentile --      Pulse Rate 03/10/24 1404 67     Resp 03/10/24 1404 18     Temp 03/10/24 1404 97.8 F (36.6 C)     Temp src --      SpO2 03/10/24 1404 94 %     Weight 03/10/24 1405 177 lb (80.3 kg)     Height 03/10/24 1405 5' 4 (1.626 m)     Head Circumference --      Peak Flow --      Pain Score 03/10/24 1405 0     Pain Loc --      Pain Education --      Exclude from Growth Chart --     Most recent vital signs: Vitals:   03/10/24 1623 03/10/24 1630  BP: (!) 133/45 (!) 133/49  Pulse: 63 64  Resp:  19  Temp: 98.2 F (36.8 C)   SpO2: 100% 100%     General: Awake, no distress.  CV:  Good peripheral perfusion.  Normal tone Resp:  Normal effort.  Clear except slight rales in the bases bilaterally no increased work of breathing Abd:  No distention.  Other:  No lower extremity edema venous cords or  ED Results / Procedures / Treatments   Labs (all labs ordered are listed, but only abnormal results are displayed) Labs Reviewed  CBC - Abnormal; Notable for the following components:      Result Value   WBC 11.8 (*)    RBC 3.18 (*)    Hemoglobin 6.6 (*)    HCT 25.2 (*)    MCV 79.2 (*)    MCH 20.8 (*)    MCHC 26.2 (*)    RDW 23.6 (*)    nRBC 0.3 (*)    All other components within normal limits  COMPREHENSIVE METABOLIC PANEL WITH GFR - Abnormal; Notable for the following components:   BUN 46 (*)    Creatinine, Ser 2.50 (*)    AST 10 (*)    GFR, Estimated 20 (*)    All other components within normal limits  BASIC METABOLIC PANEL WITH GFR  CBC  TYPE AND SCREEN  PREPARE RBC (CROSSMATCH)   Labs notable for anemia hemoglobin 6.6.  Platelet count is appropriate.  EKG  And interpreted by me at 1630 heart rate 60 QRS 100 QTc 450 No evidence of acute ischemia   RADIOLOGY  DG Chest Port 1 View Result Date: 03/10/2024 CLINICAL DATA:  Dyspnea EXAM: PORTABLE CHEST 1 VIEW COMPARISON:  Chest x-ray 10/19/2023 FINDINGS: The heart is enlarged. There central  pulmonary vascular congestion. There are interstitial opacities in the lung bases. There is no lung consolidation, pleural effusion or pneumothorax. There are atherosclerotic calcifications of the aorta. Vascular stent is seen in the superior left mediastinum, unchanged. No acute fractures are identified. IMPRESSION: Cardiomegaly with central pulmonary vascular congestion and interstitial edema. Electronically Signed   By: Greig Pique M.D.   On: 03/10/2024 16:34       PROCEDURES:  Critical Care performed: Yes, see critical care procedure note(s)  CRITICAL CARE Performed by: Oneil Budge   Total critical care time: 35 minutes  Critical care time was exclusive of separately billable procedures and treating other patients.  Critical care was necessary to treat or prevent imminent or life-threatening deterioration.  Critical care was time spent personally by me on the following activities: development of treatment plan with patient and/or surrogate as well as nursing, discussions with consultants, evaluation of patient's response to treatment, examination of patient, obtaining history from patient or surrogate, ordering and performing treatments and interventions, ordering and review of laboratory studies, ordering and review of radiographic studies, pulse oximetry and re-evaluation of patient's condition.   Procedures   MEDICATIONS ORDERED IN ED: Medications  levothyroxine  (SYNTHROID ) tablet 125 mcg (has no administration in time range)  atorvastatin  (LIPITOR) tablet 40 mg (has no administration in time range)  ALPRAZolam  (XANAX ) tablet 0.5 mg (has no administration in time range)  gabapentin  (NEURONTIN ) capsule 200 mg (has no administration in time range)  albuterol  (VENTOLIN  HFA) 108 (90 Base) MCG/ACT inhaler 2 puff (has no administration in time range)  budesonide -glycopyrrolate -formoterol  (BREZTRI ) 160-9-4.8 MCG/ACT inhaler 2 puff (has no administration in time range)  venlafaxine   XR (EFFEXOR -XR) 24 hr capsule 75 mg (has no administration in time range)  spironolactone  (ALDACTONE ) tablet 25 mg (has no administration in time range)  potassium chloride  SA (KLOR-CON  M) CR tablet 40 mEq (has no administration in time range)  bisoprolol  (ZEBETA ) tablet 5 mg (has no administration in time range)  ezetimibe  (ZETIA ) tablet 10 mg (has no administration in time range)  temazepam (RESTORIL) capsule 15 mg (has no administration in time range)  empagliflozin  (JARDIANCE ) tablet 10 mg (has  no administration in time range)  bisacodyl (DULCOLAX) EC tablet 5 mg (has no administration in time range)  ofloxacin (OCUFLOX) 0.3 % ophthalmic solution 1 drop (has no administration in time range)  ofloxacin (OCUFLOX) 0.3 % ophthalmic solution 1 drop (has no administration in time range)  sodium chloride  flush (NS) 0.9 % injection 3 mL (has no administration in time range)  sodium chloride  flush (NS) 0.9 % injection 3 mL (has no administration in time range)  0.9 %  sodium chloride  infusion (has no administration in time range)  furosemide  (LASIX ) injection 60 mg (has no administration in time range)  pantoprazole  (PROTONIX ) injection 40 mg (has no administration in time range)     IMPRESSION / MDM / ASSESSMENT AND PLAN / ED COURSE  I reviewed the triage vital signs and the nursing notes.                              Differential diagnosis includes, but is not limited to, symptomatic anemia possible somewhat occult type GI bleeding or slow AVM, patient does report dark stools history of AVM does not appear to have evidence of acute hemorrhagic shock at this time, consulted with the patient of shared medical decision making reviewed risk benefits alternatives to blood transfusion patient has had previous blood transfusion once and is agreeable to move forward with blood transfusion today.  Discussed with CHF team, at this juncture recommend that she be admitted received blood probably will need  some element of diuresis to accompany, and further follow-up as to hemoglobins and dark stools.  She is stable at this juncture to be admitted under the care of the hospitalist service.  Admitting doctor [facility]  Patient's presentation is most consistent with acute presentation with potential threat to life or bodily function.   The patient is on the cardiac monitor to evaluate for evidence of arrhythmia and/or significant heart rate changes.      Patient understanding agreeable with plan for admission.  Admitted to Dr. Kandis  FINAL CLINICAL IMPRESSION(S) / ED DIAGNOSES   Final diagnoses:  Acute on chronic anemia  Dark stools  History of chronic CHF     Rx / DC Orders   ED Discharge Orders     None        Note:  This document was prepared using Dragon voice recognition software and may include unintentional dictation errors.   Dicky Anes, MD 03/10/24 [phone]

## 2024-03-11 DIAGNOSIS — D500 Iron deficiency anemia secondary to blood loss (chronic): Secondary | ICD-10-CM | POA: Diagnosis not present

## 2024-03-11 LAB — HEMOGLOBIN: Hemoglobin: 7.4 g/dL — ABNORMAL LOW (ref 12.0–15.0)

## 2024-03-11 LAB — CBC
HCT: 27.3 % — ABNORMAL LOW (ref 36.0–46.0)
Hemoglobin: 7.6 g/dL — ABNORMAL LOW (ref 12.0–15.0)
MCH: 21.7 pg — ABNORMAL LOW (ref 26.0–34.0)
MCHC: 27.8 g/dL — ABNORMAL LOW (ref 30.0–36.0)
MCV: 78 fL — ABNORMAL LOW (ref 80.0–100.0)
Platelets: 361 K/uL (ref 150–400)
RBC: 3.5 MIL/uL — ABNORMAL LOW (ref 3.87–5.11)
RDW: 23.1 % — ABNORMAL HIGH (ref 11.5–15.5)
WBC: 11.4 K/uL — ABNORMAL HIGH (ref 4.0–10.5)
nRBC: 0.3 % — ABNORMAL HIGH (ref 0.0–0.2)

## 2024-03-11 LAB — BASIC METABOLIC PANEL WITH GFR
Anion gap: 12 (ref 5–15)
BUN: 48 mg/dL — ABNORMAL HIGH (ref 8–23)
CO2: 30 mmol/L (ref 22–32)
Calcium: 9.3 mg/dL (ref 8.9–10.3)
Chloride: 94 mmol/L — ABNORMAL LOW (ref 98–111)
Creatinine, Ser: 2.4 mg/dL — ABNORMAL HIGH (ref 0.44–1.00)
GFR, Estimated: 21 mL/min — ABNORMAL LOW (ref >60)
Glucose, Bld: 106 mg/dL — ABNORMAL HIGH (ref 70–99)
Potassium: 3.2 mmol/L — ABNORMAL LOW (ref 3.5–5.1)
Sodium: 137 mmol/L (ref 135–145)

## 2024-03-11 LAB — PREPARE RBC (CROSSMATCH)

## 2024-03-11 MED ORDER — SODIUM CHLORIDE 0.9% IV SOLUTION: Freq: Once | INTRAVENOUS | Status: AC

## 2024-03-11 MED ORDER — FUROSEMIDE 10 MG/ML IJ SOLN
60.0000 mg | Freq: Once | INTRAMUSCULAR | Status: AC
  Administered 2024-03-11: 60 mg via INTRAVENOUS
  Filled 2024-03-11: qty 8

## 2024-03-11 NOTE — Progress Notes (Signed)
 Heart Failure Navigator Progress Note Advanced Heart Failure Team patient of Ellouise Class, FNP. AHF Clinic  Hospital follow-up scheduled for 03/29/2024 @ 9:30 AM.  Entered information on patient AVS and also gave her an appointment card.  Navigator will sign-off at this time.  Charmaine Pines, RN, BSN [facility] Heart Failure Navigator Secure Chat Only

## 2024-03-11 NOTE — Discharge Summary (Signed)
 Misty Adams FMW:[id number] DOB: [birth year] DOA: 03/10/2024  PCP: Misty Adams FALCON, MD  Admit date: 03/10/2024 Discharge date: 03/11/2024  Time spent: 35 minutes  Recommendations for Outpatient Follow-up:  Needs close monitoring of anemia F/u hematology as scheduled next week Call [facility] to schedule close f/u PCP f/u as well, consider nephrology referral for ckd 4     Discharge Diagnoses:  Principal Problem:   Anemia Active Problems:   Chronic heart failure with preserved ejection fraction (HFpEF) (HCC)   Hypothyroidism   Dependence on nocturnal oxygen  therapy   Essential hypertension   Chronic kidney disease, stage 3b (HCC)   CAD in native artery   Diabetes mellitus with peripheral vascular disease (HCC)   PAD (peripheral artery disease)   Discharge Condition: improved  Diet recommendation: heart healthy  Filed Weights   03/10/24 1405  Weight: 80.3 kg    History of present illness:  From admission h and p Misty Adams is a 68 y.o. female with medical history significant for hfpef, chornic o2, ckd 4, cad s/p stent, dm, htn, hypothyroid, iron  deficiency anemia, presenting for the above.   Went to [facility] clinic today complaining of several weeks dyspnea and fatigue. Has had intermittent dark stools for some time, no other bleeding. Has had some leg swelling. No chest pain. Stable chronic cough, no fevers. Last plavix  this morning. Called when hgb returned in 6s told to go to ER.    Hospital Course:   # Iron  deficiency anemia On asa and plavix , RCA stent 12/24. Duodenal ulcer and multiple colonic polyps on endoluminal eval 8/24, plan was 6 mo repeat colonoscopy given piecemeal resection of one polyp, deferred given need for 12 months plavix . 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 Followed by heme, gets regular iron , currently weekly. Baseline hgb 8s, here 6s. Transfused 1 unit with appropriate rise, no signs acute bleeding, 2nd unit given day of  discharge. Discussed dapt with cardiology - as we are 2 weeks shy of 12 months dapt, safe to stop plavix , continue asa - advised patient call [facility] to arrange close f/u, needs repeat endoluminal eval - f/u oncology next week as scheduled (iron  infusion scheduled, may or may not need, but will need close monitoring of anemia)   # HFpEF with acute exacerbation TTE 02/2023 showed preserved EF, grade 2 DD, mild mitral stenosis, mild aortic stenosis. BNP elevated, seen in chf clinic day of admission and plan was to escalate diuresis - we gave lasix  60 IV after each unit of blood - resume home regimen on d/c, f/u chf clinic as scheduled   # Chronic hypoxic respiratory failure On 2 liters o2 at baseline, here stable on that   # CAD With DES to RCA 12/24. No chest pain - hold asa as above, plavix  discontinued - cont home statin, zetia    # COPD No exacerbation - home breztri    # Chronic pain - home gabapentin    # T2DM Well controlled - monitor daily fasting sugars - home jardiance    # GAD - home xanax , effexor    # Hypothyroid - home synthroid    # CKD 4 Baseline cr around 2 here 2.3 but per cardiology plan to diurese - advise close f/u with pcp, consider nephrology referral  Procedures: none   Consultations: none  Discharge Exam: Vitals:   03/11/24 1009 03/11/24 1053  BP: (!) 119/44 (!) 127/58  Pulse: (!) 54 (!) 56  Resp: 17 17  Temp: 97.8 F (36.6 C) 97.8  F (36.6 C)  SpO2: 96%     General: NAD Cardiovascular: RRR Respiratory: clear Ext: mild LE edema  Discharge Instructions   Discharge Instructions     Diet - low sodium heart healthy   Complete by: As directed    Increase activity slowly   Complete by: As directed       Allergies as of 03/11/2024       Reactions   Hydrocodone -acetaminophen  Hives   Oxycodone  Nausea Only   GI upset, felt faint-per pt   Vicodin [hydrocodone -acetaminophen ] Hives   Trelegy Ellipta  [fluticasone-umeclidin-vilant] Rash   Changed to Budeson-Glycopyrrol-Formoterol  by provider        Medication List     STOP taking these medications    clopidogrel  75 MG tablet Commonly known as: PLAVIX    ofloxacin  0.3 % ophthalmic solution Commonly known as: OCUFLOX        TAKE these medications    Accu-Chek Guide Test test strip Generic drug: glucose blood   Accu-Chek Softclix Lancets lancets SMARTSIG:Lancet Topical   albuterol  108 (90 Base) MCG/ACT inhaler Commonly known as: VENTOLIN  HFA Inhale 2 puffs into the lungs every 6 (six) hours as needed for wheezing or shortness of breath.   ALPRAZolam  0.5 MG tablet Commonly known as: XANAX  Take 0.5 mg by mouth 2 (two) times daily as needed for anxiety.   aspirin  81 MG tablet Take 81 mg by mouth at bedtime.   atorvastatin  40 MG tablet Commonly known as: Lipitor Take 1 tablet (40 mg total) by mouth at bedtime.   bisacodyl  5 MG EC tablet Commonly known as: DULCOLAX Take 5 mg by mouth daily as needed for moderate constipation.   bisoprolol  5 MG tablet Commonly known as: ZEBETA  TAKE 1 TABLET (5 MG TOTAL) BY MOUTH DAILY.   Breztri  Aerosphere 160-9-4.8 MCG/ACT Aero inhaler Generic drug: budesonide -glycopyrrolate -formoterol  Inhale 2 puffs into the lungs 2 (two) times daily.   ezetimibe  10 MG tablet Commonly known as: ZETIA  TAKE 1 TABLET BY MOUTH EVERY DAY   gabapentin  100 MG capsule Commonly known as: NEURONTIN  Take 200 mg by mouth at bedtime.   Jardiance  10 MG Tabs tablet Generic drug: empagliflozin  TAKE ONE TABLET BY MOUTH DAILY   levothyroxine  125 MCG tablet Commonly known as: SYNTHROID  Take 125 mcg by mouth daily.   metolazone  2.5 MG tablet Commonly known as: ZAROXOLYN  Take 1 tab weekly and an additional dose as directed by HF CLINIC   OXYGEN  Inhale 2 L/L into the lungs at bedtime.   pantoprazole  40 MG tablet Commonly known as: PROTONIX  Take 40 mg by mouth 2 (two) times daily.   potassium  chloride 10 MEQ tablet Commonly known as: KLOR-CON  Take 30 mEq by mouth in the morning and at bedtime.   potassium chloride  SA 20 MEQ tablet Commonly known as: Klor-Con  M20 Take 2 tablets (40 mEq total) by mouth daily. And additional 20meq with metolazone    prednisoLONE acetate 1 % ophthalmic suspension Commonly known as: PRED FORTE Place 1 drop into both eyes 3 (three) times daily. Right eye one drop twice daily, left eye one drop three times daily   spironolactone  25 MG tablet Commonly known as: ALDACTONE  Take 1 tablet (25 mg total) by mouth daily.   temazepam  15 MG capsule Commonly known as: RESTORIL  Take 15 mg by mouth at bedtime as needed.   torsemide  20 MG tablet Commonly known as: DEMADEX  Take 3 tablets (60 mg total) by mouth daily.   traZODone  100 MG tablet Commonly known as: DESYREL  Take 100 mg by  mouth daily as needed for sleep. What changed: when to take this   venlafaxine  XR 75 MG 24 hr capsule Commonly known as: EFFEXOR -XR Take 75 mg by mouth daily with breakfast.   Vitamin B-12 500 MCG Subl Place under the tongue daily.   Vitamin D3 75 MCG (3000 UT) Tabs Take 3,000 Units by mouth once a week. Tuesday       Allergies  Allergen Reactions   Hydrocodone -Acetaminophen  Hives   Oxycodone  Nausea Only    GI upset, felt faint-per pt   Vicodin [Hydrocodone -Acetaminophen ] Hives   Trelegy Ellipta [Fluticasone-Umeclidin-Vilant] Rash    Changed to Budeson-Glycopyrrol-Formoterol  by provider    Follow-up Information     Misty Adams FALCON, MD Follow up.   Specialty: Internal Medicine Contact information: [phone] [facility] [address] [facility] [place] [address] [phone]         Onita Elspeth Sharper, DO Follow up.   Specialty: Gastroenterology Contact information: [address] [facility] [place] [address] [phone]                  The results of significant diagnostics from this hospitalization  (including imaging, microbiology, ancillary and laboratory) are listed below for reference.    Significant Diagnostic Studies: DG Chest Port 1 View Result Date: 03/10/2024 CLINICAL DATA:  Dyspnea EXAM: PORTABLE CHEST 1 VIEW COMPARISON:  Chest x-ray 10/19/2023 FINDINGS: The heart is enlarged. There central pulmonary vascular congestion. There are interstitial opacities in the lung bases. There is no lung consolidation, pleural effusion or pneumothorax. There are atherosclerotic calcifications of the aorta. Vascular stent is seen in the superior left mediastinum, unchanged. No acute fractures are identified. IMPRESSION: Cardiomegaly with central pulmonary vascular congestion and interstitial edema. Electronically Signed   By: Greig Pique M.D.   On: 03/10/2024 16:34    Microbiology: No results found for this or any previous visit (from the past 240 hours).   Labs: Basic Metabolic Panel: Recent Labs  Lab 03/10/24 1155 03/10/24 1535 03/11/24 0444  NA 138 139 137  K 4.2 3.8 3.2*  CL 98 98 94*  CO2 30 29 30   GLUCOSE 112* 93 106*  BUN 48* 46* 48*  CREATININE 2.31* 2.50* 2.40*  CALCIUM  9.4 9.5 9.3  MG 2.6*  --   --    Liver Function Tests: Recent Labs  Lab 03/10/24 1535  AST 10*  ALT 7  ALKPHOS 88  BILITOT 0.3  PROT 6.8  ALBUMIN  3.9   No results for input(s): LIPASE, AMYLASE in the last 168 hours. No results for input(s): AMMONIA in the last 168 hours. CBC: Recent Labs  Lab 03/10/24 1155 03/10/24 1535 03/11/24 0001 03/11/24 0444  WBC 10.1 11.8*  --  11.4*  HGB 6.4* 6.6* 7.4* 7.6*  HCT 24.0* 25.2*  --  27.3*  MCV 78.4* 79.2*  --  78.0*  PLT 373 391  --  361   Cardiac Enzymes: No results for input(s): CKTOTAL, CKMB, CKMBINDEX, TROPONINI in the last 168 hours. BNP: BNP (last 3 results) Recent Labs    06/29/23 0945 10/19/23 1523 02/25/24 1010  BNP 192.3* 851.2* 651.3*    ProBNP (last 3 results) Recent Labs    03/10/24 1155  PROBNP 1,226.0*     CBG: No results for input(s): GLUCAP in the last 168 hours.     Signed:  Devaughn KATHEE Ban MD.  [facility] 03/11/2024, 12:13 PM

## 2024-03-12 LAB — TYPE AND SCREEN
ABO/RH(D): O POS
Antibody Screen: NEGATIVE
Unit division: 0
Unit division: 0
Unit division: 0
Unit division: 0

## 2024-03-12 LAB — BPAM RBC
Blood Product Expiration Date: 202512232359
Blood Product Expiration Date: 202512232359
Blood Product Expiration Date: 202512232359
Blood Product Expiration Date: 202512232359
ISSUE DATE / TIME: 202511201559
ISSUE DATE / TIME: 202511211034
Unit Type and Rh: 5100
Unit Type and Rh: 5100
Unit Type and Rh: 5100
Unit Type and Rh: 5100

## 2024-03-12 LAB — PREPARE RBC (CROSSMATCH)

## 2024-03-14 ENCOUNTER — Inpatient Hospital Stay

## 2024-03-14 VITALS — BP 123/49 | HR 64 | Temp 98.6°F | Resp 18

## 2024-03-14 DIAGNOSIS — D649 Anemia, unspecified: Secondary | ICD-10-CM

## 2024-03-14 MED ORDER — IRON SUCROSE 20 MG/ML IV SOLN
200.0000 mg | Freq: Once | INTRAVENOUS | Status: AC
  Administered 2024-03-14: 200 mg via INTRAVENOUS
  Filled 2024-03-14: qty 10

## 2024-03-21 ENCOUNTER — Inpatient Hospital Stay: Attending: Internal Medicine

## 2024-03-21 VITALS — BP 125/50 | HR 71 | Temp 97.6°F | Resp 20

## 2024-03-21 DIAGNOSIS — D631 Anemia in chronic kidney disease: Secondary | ICD-10-CM | POA: Insufficient documentation

## 2024-03-21 DIAGNOSIS — F17210 Nicotine dependence, cigarettes, uncomplicated: Secondary | ICD-10-CM | POA: Insufficient documentation

## 2024-03-21 DIAGNOSIS — D649 Anemia, unspecified: Secondary | ICD-10-CM

## 2024-03-21 DIAGNOSIS — R5383 Other fatigue: Secondary | ICD-10-CM | POA: Insufficient documentation

## 2024-03-21 DIAGNOSIS — Z801 Family history of malignant neoplasm of trachea, bronchus and lung: Secondary | ICD-10-CM | POA: Diagnosis not present

## 2024-03-21 DIAGNOSIS — N184 Chronic kidney disease, stage 4 (severe): Secondary | ICD-10-CM | POA: Diagnosis present

## 2024-03-21 DIAGNOSIS — Z8049 Family history of malignant neoplasm of other genital organs: Secondary | ICD-10-CM | POA: Insufficient documentation

## 2024-03-21 DIAGNOSIS — K921 Melena: Secondary | ICD-10-CM | POA: Insufficient documentation

## 2024-03-21 DIAGNOSIS — I132 Hypertensive heart and chronic kidney disease with heart failure and with stage 5 chronic kidney disease, or end stage renal disease: Secondary | ICD-10-CM | POA: Insufficient documentation

## 2024-03-21 DIAGNOSIS — Z803 Family history of malignant neoplasm of breast: Secondary | ICD-10-CM | POA: Diagnosis not present

## 2024-03-21 DIAGNOSIS — Z79899 Other long term (current) drug therapy: Secondary | ICD-10-CM | POA: Insufficient documentation

## 2024-03-21 DIAGNOSIS — R011 Cardiac murmur, unspecified: Secondary | ICD-10-CM | POA: Diagnosis not present

## 2024-03-21 MED ORDER — SODIUM CHLORIDE 0.9% FLUSH
10.0000 mL | Freq: Once | INTRAVENOUS | Status: AC | PRN
  Administered 2024-03-21: 10 mL
  Filled 2024-03-21: qty 10

## 2024-03-21 MED ORDER — IRON SUCROSE 20 MG/ML IV SOLN
200.0000 mg | Freq: Once | INTRAVENOUS | Status: AC
  Administered 2024-03-21: 200 mg via INTRAVENOUS
  Filled 2024-03-21: qty 10

## 2024-03-27 NOTE — H&P (Signed)
 Pre-Procedure H&P   Patient ID: Misty Adams is a 68 y.o. female.  Gastroenterology Provider: [name], DO  Referring Provider: Romero Antigua, PA PCP: Cleotilde Oneil FALCON, MD  Date: 03/28/2024  HPI Ms. Misty Adams is a 68 y.o. female who presents today for Colonoscopy and Push Enteroscopy for Iron  deficiency anemia .  Recently has had darker stools with mild abdominal discomfort and nausea without vomiting.  Having bowel movements every 3 days.  She denies any dysphagia or odynophagia.  Video capsule in April 2025 demonstrated scattered AVMs nonbleeding  EGD and colonoscopy in August 2024 demonstrating tubular adenoma and sessile serrated polyp.  1 of these was removed in piecemeal and recommended 53-month follow-up.  Unfortunately patient had a cardiac event and was placed on Plavix  that placed a delay on repeat colonoscopy. Gastric ulcer also noted  Also noted to have diverticulosis on the left side and an ascending colon AVM.  Redundant colon Internal and external hemorrhoids  Most recent hemoglobin 9.5 MCV 82.5 platelets 231,000   Past Medical History:  Diagnosis Date   Allergy    Anemia 11/23   Anxiety    Blood transfusion without reported diagnosis    CHF (congestive heart failure) (HCC)    Chronic heart failure with preserved ejection fraction (HFpEF) (HCC)    a. 02/2023 Echo: EF 60-65%, no rwma, GrII DD, nl RV size/fxn, mild LAE, mild MS (mean grad ), mod MAC, mild AS (mean grad 9.40mmHg).   CKD (chronic kidney disease), stage III (HCC)    COPD (chronic obstructive pulmonary disease) (HCC)    Coronary artery disease    Depression    Diabetes mellitus without complication (HCC)    Emphysema of lung (HCC) 2010   Hyperlipidemia    Hypertension    Hypothyroidism    Interstitial lung disease (HCC)    PAD (peripheral artery disease)    a. 07/2017 L subclavian stenosis s/p stenting.   Psoriasis    Squamous cell carcinoma of skin 08/25/2023    SCC IS,  Right [facility], referral sent for Mohs Dr. Corey   Substance abuse [facility])    Tobacco abuse     Past Surgical History:  Procedure Laterality Date   ABDOMINAL HYSTERECTOMY     AORTIC ARCH ANGIOGRAPHY N/A 08/06/2017   Procedure: AORTIC ARCH ANGIOGRAPHY;  Surgeon: Laurence Redell CROME, MD;  Location: [facility] INVASIVE CV LAB;  Service: Cardiovascular;  Laterality: N/A;   APPENDECTOMY     BIOPSY  12/19/2022   Procedure: BIOPSY;  Surgeon: Jungling Elspeth Ozell, DO;  Location: [facility] ENDOSCOPY;  Service: Gastroenterology;;   CESAREAN SECTION     COLONOSCOPY     COLONOSCOPY WITH PROPOFOL  N/A 12/07/2020   Procedure: COLONOSCOPY WITH PROPOFOL ;  Surgeon: Jungling Elspeth Ozell, DO;  Location[facility] ENDOSCOPY;  Service: Gastroenterology;  Laterality: N/A;   COLONOSCOPY WITH PROPOFOL  N/A 12/19/2022   Procedure: COLONOSCOPY WITH PROPOFOL ;  Surgeon: Jungling Elspeth Ozell, DO;  Location: [facility] ENDOSCOPY;  Service: Gastroenterology;  Laterality: N/A;   CORONARY STENT INTERVENTION N/A 03/26/2023   Procedure: CORONARY STENT INTERVENTION;  Surgeon: Anner Alm ORN, MD;  Location: [facility] INVASIVE CV LAB;  Service: Cardiovascular;  Laterality: N/A;   dilatation and curettage     DILATION AND CURETTAGE OF UTERUS     ESOPHAGOGASTRODUODENOSCOPY N/A 12/07/2020   Procedure: ESOPHAGOGASTRODUODENOSCOPY (EGD);  Surgeon: Jungling Elspeth Ozell, DO;  Location: [facility] ENDOSCOPY;  Service: Gastroenterology;  Laterality: N/A;   ESOPHAGOGASTRODUODENOSCOPY (EGD) WITH PROPOFOL  N/A 12/19/2022   Procedure: ESOPHAGOGASTRODUODENOSCOPY (EGD) WITH  PROPOFOL ;  Surgeon: Onita Elspeth Sharper, DO;  Location: [facility] ENDOSCOPY;  Service: Gastroenterology;  Laterality: N/A;   KNEE SURGERY     left wrist ganglion cyst removal     OOPHORECTOMY  1994   PERIPHERAL VASCULAR INTERVENTION Left 08/06/2017   Procedure: PERIPHERAL VASCULAR INTERVENTION;  Surgeon: Laurence Redell CROME, MD;  Location: [facility] INVASIVE CV LAB;  Service: Cardiovascular;  Laterality: Left;  subclavian    POLYPECTOMY  12/19/2022   Procedure: POLYPECTOMY;  Surgeon: Onita Elspeth Sharper, DO;  Location: [facility] ENDOSCOPY;  Service: Gastroenterology;;   RIGHT/LEFT HEART CATH AND CORONARY ANGIOGRAPHY N/A 03/26/2023   Procedure: RIGHT/LEFT HEART CATH AND CORONARY ANGIOGRAPHY;  Surgeon: Anner Alm ORN, MD;  Location: [facility] INVASIVE CV LAB;  Service: Cardiovascular;  Laterality: N/A;   TUBAL LIGATION      Family History Sister- colon polyps No other h/o GI disease or malignancy  Review of Systems  Constitutional:  Negative for activity change, appetite change, chills, diaphoresis, fatigue, fever and unexpected weight change.  HENT:  Negative for trouble swallowing and voice change.   Respiratory:  Negative for shortness of breath and wheezing.   Cardiovascular:  Negative for chest pain, palpitations and leg swelling.  Gastrointestinal:  Positive for blood in stool. Negative for abdominal distention, abdominal pain, anal bleeding, constipation, diarrhea, nausea, rectal pain and vomiting.  Musculoskeletal:  Negative for arthralgias and myalgias.  Skin:  Negative for color change and pallor.  Neurological:  Negative for dizziness, syncope and weakness.  Psychiatric/Behavioral:  Negative for confusion.   All other systems reviewed and are negative.    Medications No current facility-administered medications on file prior to encounter.   Current Outpatient Medications on File Prior to Encounter  Medication Sig Dispense Refill   albuterol  (VENTOLIN  HFA) 108 (90 Base) MCG/ACT inhaler Inhale 2 puffs into the lungs every 6 (six) hours as needed for wheezing or shortness of breath. 1 each 3   ALPRAZolam  (XANAX ) 0.5 MG tablet Take 0.5 mg by mouth 2 (two) times daily as needed for anxiety.      aspirin  81 MG tablet Take 81 mg by mouth at bedtime.      atorvastatin  (LIPITOR) 40 MG tablet Take 1 tablet (40 mg total) by mouth at bedtime. 30 tablet 0   bisacodyl  (DULCOLAX) 5 MG EC tablet Take 5 mg by mouth  daily as needed for moderate constipation.     bisoprolol  (ZEBETA ) 5 MG tablet TAKE 1 TABLET (5 MG TOTAL) BY MOUTH DAILY. 90 tablet 1   Cholecalciferol  (VITAMIN D3) 75 MCG (3000 UT) TABS Take 3,000 Units by mouth once a week. Tuesday     Cyanocobalamin  (VITAMIN B-12) 500 MCG SUBL Place under the tongue daily.     ezetimibe  (ZETIA ) 10 MG tablet TAKE 1 TABLET BY MOUTH EVERY DAY 90 tablet 1   gabapentin  (NEURONTIN ) 100 MG capsule Take 200 mg by mouth at bedtime.     JARDIANCE  10 MG TABS tablet TAKE ONE TABLET BY MOUTH DAILY 90 tablet 2   levothyroxine  (SYNTHROID , LEVOTHROID) 125 MCG tablet Take 125 mcg by mouth daily.      pantoprazole  (PROTONIX ) 40 MG tablet Take 40 mg by mouth 2 (two) times daily.     potassium chloride  (KLOR-CON ) 10 MEQ tablet Take 30 mEq by mouth in the morning and at bedtime.     spironolactone  (ALDACTONE ) 25 MG tablet Take 1 tablet (25 mg total) by mouth daily. 30 tablet 0   temazepam  (RESTORIL ) 15 MG capsule Take 15 mg by mouth  at bedtime as needed.     torsemide  (DEMADEX ) 20 MG tablet Take 3 tablets (60 mg total) by mouth daily. 270 tablet 3   venlafaxine  XR (EFFEXOR -XR) 75 MG 24 hr capsule Take 75 mg by mouth daily with breakfast.     ACCU-CHEK GUIDE TEST test strip      Accu-Chek Softclix Lancets lancets SMARTSIG:Lancet Topical     budeson-glycopyrrolate -formoterol  (BREZTRI  AEROSPHERE) 160-9-4.8 MCG/ACT AERO inhaler Inhale 2 puffs into the lungs 2 (two) times daily.     metolazone  (ZAROXOLYN ) 2.5 MG tablet Take 1 tab weekly and an additional dose as directed by HF CLINIC 7 tablet 3   OXYGEN  Inhale 2 L/L into the lungs at bedtime.     potassium chloride  SA (KLOR-CON  M20) 20 MEQ tablet Take 2 tablets (40 mEq total) by mouth daily. And additional 20meq with metolazone      prednisoLONE acetate (PRED FORTE) 1 % ophthalmic suspension Place 1 drop into both eyes 3 (three) times daily. Right eye one drop twice daily, left eye one drop three times daily     traZODone  (DESYREL ) 100  MG tablet Take 100 mg by mouth daily as needed for sleep. (Patient taking differently: Take 100 mg by mouth at bedtime.)      Pertinent medications related to GI and procedure were reviewed by me with the patient prior to the procedure   Current Facility-Administered Medications:    0.9 %  sodium chloride  infusion, , Intravenous, Continuous, Onita Elspeth Sharper, DO, Last Rate: 20 mL/hr at 03/28/24 0806, New Bag at 03/28/24 0806  sodium chloride  20 mL/hr at 03/28/24 9193       Allergies  Allergen Reactions   Hydrocodone -Acetaminophen  Hives   Oxycodone  Nausea Only    GI upset, felt faint-per pt   Vicodin [Hydrocodone -Acetaminophen ] Hives   Trelegy Ellipta [Fluticasone-Umeclidin-Vilant] Rash    Changed to Budeson-Glycopyrrol-Formoterol  by provider   Allergies were reviewed by me prior to the procedure  Objective   Body mass index is 30.54 kg/m. Vitals:   03/28/24 0756  BP: 135/68  Pulse: 70  Resp: (!) 21  Temp: 97.6 F (36.4 C)  TempSrc: Temporal  SpO2: 95%  Weight: 80.7 kg  Height: 5' 4.02 (1.626 m)     Physical Exam Vitals and nursing note reviewed.  Constitutional:      General: She is not in acute distress.    Appearance: Normal appearance. She is not ill-appearing, toxic-appearing or diaphoretic.  HENT:     Head: Normocephalic and atraumatic.     Nose: Nose normal.     Mouth/Throat:     Mouth: Mucous membranes are moist.     Pharynx: Oropharynx is clear.  Eyes:     General: No scleral icterus.    Extraocular Movements: Extraocular movements intact.  Cardiovascular:     Rate and Rhythm: Normal rate and regular rhythm.     Heart sounds: Normal heart sounds. No murmur heard.    No friction rub. No gallop.  Pulmonary:     Effort: Pulmonary effort is normal. No respiratory distress.     Breath sounds: Normal breath sounds. No wheezing, rhonchi or rales.  Abdominal:     General: Bowel sounds are normal. There is no distension.     Palpations: Abdomen is  soft.     Tenderness: There is no abdominal tenderness. There is no guarding or rebound.  Musculoskeletal:     Cervical back: Neck supple.     Right lower leg: No edema.     Left  lower leg: No edema.  Skin:    General: Skin is warm and dry.     Coloration: Skin is not jaundiced or pale.  Neurological:     General: No focal deficit present.     Mental Status: She is alert and oriented to person, place, and time. Mental status is at baseline.  Psychiatric:        Mood and Affect: Mood normal.        Behavior: Behavior normal.        Thought Content: Thought content normal.        Judgment: Judgment normal.      Assessment:  Ms. Misty Adams is a 67 y.o. female  who presents today for Colonoscopy and Push Enteroscopy for Iron  deficiency anemia .  Plan:  Colonoscopy and Push Enteroscopy with possible intervention today  Colonoscopy and Push Enteroscopy with possible biopsy, control of bleeding, polypectomy, and interventions as necessary has been discussed with the patient/patient representative. Informed consent was obtained from the patient/patient representative after explaining the indication, nature, and risks of the procedure including but not limited to death, bleeding, perforation, missed neoplasm/lesions, cardiorespiratory compromise, and reaction to medications. Opportunity for questions was given and appropriate answers were provided. Patient/patient representative has verbalized understanding is amenable to undergoing the procedure.   [name], DO  [facility]  Portions of the record may have been created with voice recognition software. Occasional wrong-word or 'sound-a-like' substitutions may have occurred due to the inherent limitations of voice recognition software.  Read the chart carefully and recognize, using context, where substitutions may have occurred.

## 2024-03-28 ENCOUNTER — Ambulatory Visit: Admitting: Anesthesiology

## 2024-03-28 ENCOUNTER — Encounter: Payer: Self-pay | Admitting: Gastroenterology

## 2024-03-28 ENCOUNTER — Other Ambulatory Visit: Payer: Self-pay

## 2024-03-28 ENCOUNTER — Encounter: Payer: Self-pay | Admitting: Family

## 2024-03-28 ENCOUNTER — Inpatient Hospital Stay

## 2024-03-28 ENCOUNTER — Telehealth: Payer: Self-pay | Admitting: Family

## 2024-03-28 ENCOUNTER — Ambulatory Visit
Admission: RE | Admit: 2024-03-28 | Discharge: 2024-03-28 | Disposition: A | Attending: Gastroenterology | Admitting: Gastroenterology

## 2024-03-28 ENCOUNTER — Encounter: Admission: RE | Disposition: A | Payer: Self-pay | Source: Home / Self Care | Attending: Gastroenterology

## 2024-03-28 HISTORY — PX: ESOPHAGOGASTRODUODENOSCOPY: SHX5428

## 2024-03-28 HISTORY — PX: HOT HEMOSTASIS: SHX5433

## 2024-03-28 HISTORY — PX: COLONOSCOPY: SHX5424

## 2024-03-28 HISTORY — PX: ENTEROSCOPY: SHX5533

## 2024-03-28 HISTORY — PX: POLYPECTOMY: SHX149

## 2024-03-28 LAB — GLUCOSE, CAPILLARY: Glucose-Capillary: 117 mg/dL — ABNORMAL HIGH (ref 70–99)

## 2024-03-28 SURGERY — COLONOSCOPY
Anesthesia: General

## 2024-03-28 MED ORDER — LIDOCAINE HCL (CARDIAC) PF 100 MG/5ML IV SOSY
PREFILLED_SYRINGE | INTRAVENOUS | Status: DC | PRN
  Administered 2024-03-28: 80 mg via INTRAVENOUS

## 2024-03-28 MED ORDER — SODIUM CHLORIDE 0.9 % IV SOLN: INTRAVENOUS | Status: DC

## 2024-03-28 MED ORDER — LIDOCAINE HCL (PF) 2 % IJ SOLN
INTRAMUSCULAR | Status: AC
  Filled 2024-03-28: qty 5

## 2024-03-28 MED ORDER — DEXMEDETOMIDINE HCL IN NACL 80 MCG/20ML IV SOLN
INTRAVENOUS | Status: DC | PRN
  Administered 2024-03-28: 8 ug via INTRAVENOUS
  Administered 2024-03-28: 12 ug via INTRAVENOUS

## 2024-03-28 MED ORDER — PROPOFOL 500 MG/50ML IV EMUL
INTRAVENOUS | Status: DC | PRN
  Administered 2024-03-28: 75 ug/kg/min via INTRAVENOUS

## 2024-03-28 MED ORDER — GLYCOPYRROLATE 0.2 MG/ML IJ SOLN
INTRAMUSCULAR | Status: DC | PRN
  Administered 2024-03-28: .2 mg via INTRAVENOUS

## 2024-03-28 MED ORDER — PROPOFOL 10 MG/ML IV BOLUS
INTRAVENOUS | Status: DC | PRN
  Administered 2024-03-28: 50 mg via INTRAVENOUS
  Administered 2024-03-28: 30 mg via INTRAVENOUS

## 2024-03-28 MED ORDER — GLYCOPYRROLATE 0.2 MG/ML IJ SOLN
INTRAMUSCULAR | Status: AC
  Filled 2024-03-28: qty 1

## 2024-03-28 MED ORDER — EPHEDRINE 5 MG/ML INJ
INTRAVENOUS | Status: AC
  Filled 2024-03-28: qty 5

## 2024-03-28 NOTE — Interval H&P Note (Signed)
 History and Physical Interval Note: Preprocedure H&P from 03/28/24  was reviewed and there was no interval change after seeing and examining the patient.  Written consent was obtained from the patient after discussion of risks, benefits, and alternatives. Patient has consented to proceed with Colonoscopy and Push Enteroscopy with possible intervention   03/28/2024 8:42 AM  Misty Adams  has presented today for surgery, with the diagnosis of Iron  deficiency anemia, unspecified iron  deficiency anemia type [D50.9].  The various methods of treatment have been discussed with the patient and family. After consideration of risks, benefits and other options for treatment, the patient has consented to  Procedure(s) with comments: COLONOSCOPY (N/A) - DM and patient on Plavix  EGD (ESOPHAGOGASTRODUODENOSCOPY) (N/A) - EGD with Enteroscopy ENTEROSCOPY (N/A) as a surgical intervention.  The patient's history has been reviewed, patient examined, no change in status, stable for surgery.  I have reviewed the patient's chart and labs.  Questions were answered to the patient's satisfaction.     [name]

## 2024-03-28 NOTE — Anesthesia Postprocedure Evaluation (Signed)
 Anesthesia Post Note  Patient: Asencion G Eckles  Procedure(s) Performed: COLONOSCOPY EGD (ESOPHAGOGASTRODUODENOSCOPY) ENTEROSCOPY  Patient location during evaluation: Endoscopy Anesthesia Type: General Level of consciousness: awake and alert Pain management: pain level controlled Vital Signs Assessment: post-procedure vital signs reviewed and stable Respiratory status: spontaneous breathing, nonlabored ventilation, respiratory function stable and patient connected to nasal cannula oxygen  Cardiovascular status: blood pressure returned to baseline and stable Postop Assessment: no apparent nausea or vomiting Anesthetic complications: no   No notable events documented.   Last Vitals:  Vitals:   03/28/24 0944 03/28/24 0953  BP: (!) 118/51 (!) 123/52  Pulse: 71 68  Resp: (!) 21 (!) 23  Temp:    SpO2: 94% 98%    Last Pain:  Vitals:   03/28/24 0943  TempSrc:   PainSc: 0-No pain                 Lendia LITTIE Mae

## 2024-03-28 NOTE — Telephone Encounter (Signed)
 Called to confirm/remind patient of their appointment at [facility] on 03/29/24.   Appointment:   [] Confirmed  [x] Left mess   [] No answer/No voice mail  [] VM Full/unable to leave message  [] Phone not in service  Patient reminded to bring all medications and/or complete list.  Confirmed patient has transportation. Gave directions, instructed to utilize valet parking.

## 2024-03-28 NOTE — Transfer of Care (Signed)
 Immediate Anesthesia Transfer of Care Note  Patient: Misty Adams  Procedure(s) Performed: COLONOSCOPY EGD (ESOPHAGOGASTRODUODENOSCOPY) ENTEROSCOPY  Patient Location: PACU  Anesthesia Type:General  Level of Consciousness: sedated  Airway & Oxygen  Therapy: Patient Spontanous Breathing and Patient connected to nasal cannula oxygen   Post-op Assessment: Report given to RN and Post -op Vital signs reviewed and stable  Post vital signs: Reviewed and stable  Last Vitals:  Vitals Value Taken Time  BP 118/51 03/28/24 09:44  Temp    Pulse 72 03/28/24 09:45  Resp 22 03/28/24 09:45  SpO2 98 % 03/28/24 09:45  Vitals shown include unfiled device data.  Last Pain:  Vitals:   03/28/24 0756  TempSrc: Temporal  PainSc: 0-No pain         Complications: No notable events documented.

## 2024-03-28 NOTE — Op Note (Signed)
 [facility] Gastroenterology Patient Name: Misty Adams Procedure Date: 03/28/2024 8:42 AM MRN: [id number] Account #: [id number] Date of Birth: 1955-05-20 Admit Type: Outpatient Age: 68 Room: [facility] ENDO ROOM 2 Gender: Female Note Status: Finalized Instrument Name: Peds Colonoscope [id number] Procedure:             Small bowel enteroscopy Indications:           Iron  deficiency anemia Providers:             [name] DO, DO Referring MD:          Oneil PHEBE Pinal, MD (Referring MD) Medicines:             Monitored Anesthesia Care Complications:         No immediate complications. Estimated blood loss:                         Minimal. Procedure:             Pre-Anesthesia Assessment:                        - Prior to the procedure, a History and Physical was                         performed, and patient medications and allergies were                         reviewed. The patient is competent. The risks and                         benefits of the procedure and the sedation options and                         risks were discussed with the patient. All questions                         were answered and informed consent was obtained.                         Patient identification and proposed procedure were                         verified by the physician, the nurse, the anesthetist                         and the technician in the endoscopy suite. Mental                         Status Examination: alert and oriented. Airway                         Examination: normal oropharyngeal airway and neck                         mobility. Respiratory Examination: poor air movement.                         CV Examination: regular rate and rhythm. Prophylactic  Antibiotics: The patient does not require prophylactic                         antibiotics. Prior Anticoagulants: The patient has                         taken no anticoagulant or  antiplatelet agents. ASA                         Grade Assessment: III - A patient with severe systemic                         disease. After reviewing the risks and benefits, the                         patient was deemed in satisfactory condition to                         undergo the procedure. The anesthesia plan was to use                         monitored anesthesia care (MAC). Immediately prior to                         administration of medications, the patient was                         re-assessed for adequacy to receive sedatives. The                         heart rate, respiratory rate, oxygen  saturations,                         blood pressure, adequacy of pulmonary ventilation, and                         response to care were monitored throughout the                         procedure. The physical status of the patient was                         re-assessed after the procedure.                        After obtaining informed consent, the endoscope was                         passed under direct vision. Throughout the procedure,                         the patient's blood pressure, pulse, and oxygen                          saturations were monitored continuously. The                         Colonoscope was introduced through the mouth and  advanced to the proximal jejunum. The small bowel                         enteroscopy was accomplished without difficulty. The                         patient tolerated the procedure well. Findings:      There was no evidence of significant pathology in the proximal jejunum.       Estimated blood loss: none.      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 Coagulation for bleeding prevention using argon plasma was       successful. Estimated blood loss: none.      Localized mild mucosal changes characterized by granularity were found       in the duodenal bulb.  Biopsies were taken with a cold forceps for       histology. Estimated blood loss was minimal.      The esophagus was normal.      The stomach was normal. Impression:            - The examined portion of the jejunum was normal.                        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                         Treated with argon plasma coagulation (APC).                        - Mucosal changes in the duodenum. Biopsied.                        - Normal esophagus.                        - Normal stomach. Recommendation:        - Discharge patient to home.                        - Resume previous diet.                        - Continue present medications.                        - Await pathology results.                        - Repeat the small bowel enteroscopy PRN for                         retreatment.                        - Return to GI clinic as previously scheduled.                        - If persistent issue, consider octreotide                        - The findings  and recommendations were discussed with                         the patient. Procedure Code(s):     --- Professional ---                        [phone], 59, Small intestinal endoscopy, enteroscopy                         beyond second portion of duodenum, not including                         ileum; with control of bleeding (eg, injection,                         bipolar cautery, unipolar cautery, laser, heater                         probe, stapler, plasma coagulator)                        44361, Small intestinal endoscopy, enteroscopy beyond                         second portion of duodenum, not including ileum; with                         biopsy, single or multiple Diagnosis Code(s):     --- Professional ---                        X68.180, Angiodysplasia of stomach and duodenum                         without bleeding                        K31.89, Other diseases of stomach and duodenum                         D50.9, Iron  deficiency anemia, unspecified CPT copyright 2022 American Medical Association. All rights reserved. The codes documented in this report are preliminary and upon coder review may  be revised to meet current compliance requirements. Attending Participation:      I personally performed the entire procedure. Elspeth Jungling, DO [name] DO, DO 03/28/2024 9:15:43 AM This report has been signed electronically. Number of Addenda: 0 Note Initiated On: 03/28/2024 8:42 AM Estimated Blood Loss:  Estimated blood loss was minimal.      [facility]

## 2024-03-28 NOTE — Op Note (Signed)
 [facility] Gastroenterology Patient Name: Misty Adams Procedure Date: 03/28/2024 8:41 AM MRN: [id number] Account #: [id number] Date of Birth: Aug 20, 1955 Admit Type: Outpatient Age: 68 Room: [facility] ENDO ROOM 2 Gender: Female Note Status: Finalized Instrument Name: Colon Scope [id number] Procedure:             Colonoscopy Indications:           Iron  deficiency anemia Providers:             [name] DO, DO Referring MD:          Oneil PHEBE Pinal, MD (Referring MD) Medicines:             Monitored Anesthesia Care Complications:         No immediate complications. Estimated blood loss:                         Minimal. Procedure:             Pre-Anesthesia Assessment:                        - Prior to the procedure, a History and Physical was                         performed, and patient medications and allergies were                         reviewed. The patient is competent. The risks and                         benefits of the procedure and the sedation options and                         risks were discussed with the patient. All questions                         were answered and informed consent was obtained.                         Patient identification and proposed procedure were                         verified by the physician, the nurse, the anesthetist                         and the technician in the endoscopy suite. Mental                         Status Examination: alert and oriented. Airway                         Examination: normal oropharyngeal airway and neck                         mobility. Respiratory Examination: poor air movement.                         CV Examination: regular rate and rhythm. Prophylactic  Antibiotics: The patient does not require prophylactic                         antibiotics. Prior Anticoagulants: The patient has                         taken no anticoagulant or antiplatelet agents. ASA                          Grade Assessment: III - A patient with severe systemic                         disease. After reviewing the risks and benefits, the                         patient was deemed in satisfactory condition to                         undergo the procedure. The anesthesia plan was to use                         monitored anesthesia care (MAC). Immediately prior to                         administration of medications, the patient was                         re-assessed for adequacy to receive sedatives. The                         heart rate, respiratory rate, oxygen  saturations,                         blood pressure, adequacy of pulmonary ventilation, and                         response to care were monitored throughout the                         procedure. The physical status of the patient was                         re-assessed after the procedure.                        After obtaining informed consent, the colonoscope was                         passed under direct vision. Throughout the procedure,                         the patient's blood pressure, pulse, and oxygen                          saturations were monitored continuously. The                         Colonoscope was introduced through the anus and  advanced to the the terminal ileum, with                         identification of the appendiceal orifice and IC                         valve. The colonoscopy was performed without                         difficulty. The patient tolerated the procedure well.                         The quality of the bowel preparation was evaluated                         using the BBPS [facility] Bowel Preparation Scale) with                         scores of: Right Colon = 2 (minor amount of residual                         staining, small fragments of stool and/or opaque                         liquid, but mucosa seen well), Transverse Colon = 3                          (entire mucosa seen well with no residual staining,                         small fragments of stool or opaque liquid) and Left                         Colon = 3 (entire mucosa seen well with no residual                         staining, small fragments of stool or opaque liquid).                         The total BBPS score equals 8. The quality of the                         bowel preparation was excellent. The terminal ileum,                         ileocecal valve, appendiceal orifice, and rectum were                         photographed. Findings:      Hemorrhoids were found on perianal exam.      The digital rectal exam was normal. Pertinent negatives include normal       sphincter tone.      The terminal ileum appeared normal. Estimated blood loss: none.      Retroflexion in the right colon was performed.      A 2 to 3 mm polyp was found in the ileocecal valve. The polyp was  sessile. The polyp was removed with a jumbo cold forceps. Resection and       retrieval were complete. Estimated blood loss was minimal.      A 3 to 4 mm polyp was found in the ascending colon. The polyp was       sessile. The polyp was removed with a cold snare. Resection and       retrieval were complete. Estimated blood loss was minimal.      Non-bleeding external and internal hemorrhoids were found during       retroflexion and during perianal exam. Estimated blood loss: none.      The exam was otherwise without abnormality on direct and retroflexion       views. Impression:            - Hemorrhoids found on perianal exam.                        - The examined portion of the ileum was normal.                        - One 2 to 3 mm polyp at the ileocecal valve, removed                         with a jumbo cold forceps. Resected and retrieved.                        - One 3 to 4 mm polyp in the ascending colon, removed                         with a cold snare. Resected and retrieved.                         - Non-bleeding external and internal hemorrhoids.                        - The examination was otherwise normal on direct and                         retroflexion views. Recommendation:        - Patient has a contact number available for                         emergencies. The signs and symptoms of potential                         delayed complications were discussed with the patient.                         Return to normal activities tomorrow. Written                         discharge instructions were provided to the patient.                        - Discharge patient to home.                        - Resume previous diet.                        -  Continue present medications.                        - Await pathology results.                        - Repeat colonoscopy for surveillance based on                         pathology results.                        - Return to GI office as previously scheduled.                        - The findings and recommendations were discussed with                         the patient. Procedure Code(s):     --- Professional ---                        [phone], Colonoscopy, flexible; with removal of                         tumor(s), polyp(s), or other lesion(s) by snare                         technique                        45380, 59, Colonoscopy, flexible; with biopsy, single                         or multiple Diagnosis Code(s):     --- Professional ---                        K64.8, Other hemorrhoids                        D12.0, Benign neoplasm of cecum                        D12.2, Benign neoplasm of ascending colon                        D50.9, Iron  deficiency anemia, unspecified CPT copyright 2022 American Medical Association. All rights reserved. The codes documented in this report are preliminary and upon coder review may  be revised to meet current compliance requirements. Attending Participation:      I personally  performed the entire procedure. Elspeth Jungling, DO [name] DO, DO 03/28/2024 9:44:16 AM This report has been signed electronically. Number of Addenda: 0 Note Initiated On: 03/28/2024 8:41 AM Scope Withdrawal Time: 0 hours 13 minutes 53 seconds  Total Procedure Duration: 0 hours 21 minutes 0 seconds  Estimated Blood Loss:  Estimated blood loss was minimal.      [facility]

## 2024-03-28 NOTE — Anesthesia Preprocedure Evaluation (Addendum)
 Anesthesia Evaluation  Patient identified by MRN, date of birth, ID band Patient awake    Reviewed: Allergy & Precautions, NPO status , Patient's Chart, lab work & pertinent test results  History of Anesthesia Complications Negative for: history of anesthetic complications  Airway Mallampati: III  TM Distance: >3 FB Neck ROM: full    Dental  (+) Edentulous Upper, Edentulous Lower   Pulmonary COPD,  COPD inhaler, Current Smoker and Patient abstained from smoking.   Pulmonary exam normal        Cardiovascular hypertension, On Medications + CAD, + Peripheral Vascular Disease and +CHF  Normal cardiovascular exam     Neuro/Psych  PSYCHIATRIC DISORDERS Anxiety Depression    negative neurological ROS     GI/Hepatic Neg liver ROS,GERD  ,,  Endo/Other  diabetesHypothyroidism    Renal/GU CRFRenal disease  negative genitourinary   Musculoskeletal   Abdominal   Peds  Hematology  (+) Blood dyscrasia, anemia   Anesthesia Other Findings Past Medical History: No date: Allergy 11/23: Anemia No date: Anxiety No date: Blood transfusion without reported diagnosis No date: CHF (congestive heart failure) (HCC) No date: Chronic heart failure with preserved ejection fraction  (HFpEF) (HCC)     Comment:  a. 02/2023 Echo: EF 60-65%, no rwma, GrII DD, nl RV               size/fxn, mild LAE, mild MS (mean grad ), mod MAC,               mild AS (mean grad 9.59mmHg). No date: CKD (chronic kidney disease), stage III (HCC) No date: COPD (chronic obstructive pulmonary disease) (HCC) No date: Coronary artery disease No date: Depression No date: Diabetes mellitus without complication (HCC) 2010: Emphysema of lung (HCC) No date: Hyperlipidemia No date: Hypertension No date: Hypothyroidism No date: Interstitial lung disease (HCC) No date: PAD (peripheral artery disease)     Comment:  a. 07/2017 L subclavian stenosis s/p stenting. No  date: Psoriasis 08/25/2023: Squamous cell carcinoma of skin     Comment:  SCC IS,  Right [place], referral sent for Mohs Dr. Corey No date: Substance abuse (HCC) No date: Tobacco abuse  Past Surgical History: No date: ABDOMINAL HYSTERECTOMY 08/06/2017: AORTIC ARCH ANGIOGRAPHY; N/A     Comment:  Procedure: AORTIC ARCH ANGIOGRAPHY;  Surgeon: Laurence Redell CROME, MD;  Location: [facility] INVASIVE CV LAB;  Service:               Cardiovascular;  Laterality: N/A; No date: APPENDECTOMY 12/19/2022: BIOPSY     Comment:  Procedure: BIOPSY;  Surgeon: Onita Elspeth Sharper, DO;               Location: [facility] ENDOSCOPY;  Service: Gastroenterology;; No date: CESAREAN SECTION No date: COLONOSCOPY 12/07/2020: COLONOSCOPY WITH PROPOFOL ; N/A     Comment:  Procedure: COLONOSCOPY WITH PROPOFOL ;  Surgeon: Onita Elspeth Sharper, DO;  Location: [facility] ENDOSCOPY;  Service:               Gastroenterology;  Laterality: N/A; 12/19/2022: COLONOSCOPY WITH PROPOFOL ; N/A     Comment:  Procedure: COLONOSCOPY WITH PROPOFOL ;  Surgeon: Onita Elspeth Sharper, DO;  Location: [facility] ENDOSCOPY;  Service:  Gastroenterology;  Laterality: N/A; 03/26/2023: CORONARY STENT INTERVENTION; N/A     Comment:  Procedure: CORONARY STENT INTERVENTION;  Surgeon:               Anner Alm ORN, MD;  Location: [facility] INVASIVE CV LAB;                Service: Cardiovascular;  Laterality: N/A; No date: dilatation and curettage No date: DILATION AND CURETTAGE OF UTERUS 12/07/2020: ESOPHAGOGASTRODUODENOSCOPY; N/A     Comment:  Procedure: ESOPHAGOGASTRODUODENOSCOPY (EGD);  Surgeon:               Onita Elspeth Sharper, DO;  Location: [facility] ENDOSCOPY;                Service: Gastroenterology;  Laterality: N/A; 12/19/2022: ESOPHAGOGASTRODUODENOSCOPY (EGD) WITH PROPOFOL ; N/A     Comment:  Procedure: ESOPHAGOGASTRODUODENOSCOPY (EGD) WITH               PROPOFOL ;  Surgeon: Onita Elspeth Sharper, DO;  Location:               [facility] ENDOSCOPY;  Service: Gastroenterology;  Laterality:               N/A; No date: KNEE SURGERY No date: left wrist ganglion cyst removal 1994: OOPHORECTOMY 08/06/2017: PERIPHERAL VASCULAR INTERVENTION; Left     Comment:  Procedure: PERIPHERAL VASCULAR INTERVENTION;  Surgeon:               Laurence Redell CROME, MD;  Location: [facility] INVASIVE CV LAB;                Service: Cardiovascular;  Laterality: Left;  subclavian 12/19/2022: POLYPECTOMY     Comment:  Procedure: POLYPECTOMY;  Surgeon: Onita Elspeth Sharper,              DO;  Location: [facility] ENDOSCOPY;  Service:               Gastroenterology;; 03/26/2023: RIGHT/LEFT HEART CATH AND CORONARY ANGIOGRAPHY; N/A     Comment:  Procedure: RIGHT/LEFT HEART CATH AND CORONARY               ANGIOGRAPHY;  Surgeon: Anner Alm ORN, MD;  Location:               [facility] INVASIVE CV LAB;  Service: Cardiovascular;                Laterality: N/A; No date: TUBAL LIGATION     Reproductive/Obstetrics negative OB ROS                              Anesthesia Physical Anesthesia Plan  ASA: 3  Anesthesia Plan: General   Post-op Pain Management: Minimal or no pain anticipated   Induction: Intravenous  PONV Risk Score and Plan: 2 and Propofol  infusion and TIVA  Airway Management Planned: Natural Airway and Nasal Cannula  Additional Equipment:   Intra-op Plan:   Post-operative Plan:   Informed Consent: I have reviewed the patients History and Physical, chart, labs and discussed the procedure including the risks, benefits and alternatives for the proposed anesthesia with the patient or authorized representative who has indicated his/her understanding and acceptance.     Dental Advisory Given  Plan Discussed with: Anesthesiologist, CRNA and Surgeon  Anesthesia Plan Comments: (Patient consented for risks of anesthesia including but not limited to:  - adverse reactions to medications - risk of airway placement if required -  damage  to eyes, teeth, lips or other oral mucosa - nerve damage due to positioning  - sore throat or hoarseness - Damage to heart, brain, nerves, lungs, other parts of body or loss of life  Patient voiced understanding and assent.)         Anesthesia Quick Evaluation

## 2024-03-28 NOTE — Progress Notes (Deleted)
 Advanced Heart Failure Clinic Note    PCP: Cleotilde Oneil FALCON, MD  Cardiologist: [name], MD/ Loistine Sober, NP   Chief Complaint: shortness of breath   HPI:  Ms Fulbright is a 68 y/o female with a history of COPD, HTN, hyperlipidemia, mild pHTN, GERD, subclavian artery stenosis (stented 07/2017), CKD, pulmonary HTN, CAD, iron  deficiency anemia, hypothyroidism, PVD, tobacco use and chronic heart failure.   Admitted 07/14/22 with E. coli UTI, severe sepsis, and AKI. High-sensitivity troponin rose to 56. She did not undergo cardiology evaluation. In August 2024 CT chest performed secondary to weight loss showed aortic atherosclerosis, unchanged 4 mm right apical pulmonary nodule, enlarged pulmonary trunk consistent with PAH, left adrenal adenoma.   Admitted 03/08/23 due to waking up short of breath & orthopnea after returning from a cruise. BNP 710. CXR showed heart failure. Initially placed on bipap and weaned down to nasal cannula. IV lasix  given. Hypokalemia corrected. Echocardiogram performed on 03/08/2023 showed ejection fraction 60 to 65% with grade 2 diastolic dysfunction. Symptoms improved.   Admitted 03/23/23 due to worsening of shortness of breath, leg swelling and significant weight gain. Developed hypoxia and placed on 5L nasal cannula. BNP elevated and given IV lasix . Cardiology consulted. Chest x-ray showed evidence of pulmonary edema with pleural effusion. CT shows signs consistent with pulmonary hypertension. Oxygen  weaned down to 2L. L and R heart cath 12/5 with stent placed to RCA. Hypokalemia corrected.   RHC/ LHC 03/26/23:   Prox RCA lesion is 45% stenosed.   CULPRIT LESION: Mid RCA lesion is 90% stenosed.   A drug-eluting stent was successfully placed using a STENT ONYX FRONTIER 3.0X22.   Post intervention, there is a 0% residual stenosis.   -------------------------------------   Hemodynamic findings consistent with mild pulmonary hypertension.  (PAP 49/24-33  mmHg, PCWP 24 mmHg transpulmonary Gradient 9 mmHg); LVEDP 16 mmHg.   Compensated CHF: LVEDP 16 mmHg, PCWP 24 mmHg.   Recommend uninterrupted dual antiplatelet therapy with Aspirin  81mg  daily and Clopidogrel  75mg  daily for a minimum of 12 months (ACS-Class I recommendation).  Admitted 07/06/23 with lightheadedness and multiple presyncopal episodes in preceding 24 hours leading to a fall on her lower back. Hypotensive in ED 95/45. RBC transfusion given for hemoglobin of 6.6. Was also given IV iron  infusion. No injuries noted on imaging in the ED. Given IVF for hypotension. GI consulted and will have outpatient follow-up.   Seen in [facility] 04/25 and spironolactone  12.5mg  daily was started.   Admitted 10/19/23 with progressive lower extremity swelling, weight gain, dyspnea, orthopnea, cough. Had doubled her torsemide  at home without any improvement. On admission, BNP was 851.2, HS-troponin was 8, and iron  studies pending. Chest x-ray noted central vascular congestion with small left effusion. IV diuresed.   Had called [facility] 10/25/23 with weight gain and had diuretic increased.   Seen in [facility] 02/25/24 being volume up after going to the beach and not having metolazone  for 2 weeks due to pharmacy telling her it couldn't be filled. Was given metolazone  2.5mg  daily X 2 days & then to resume weekly dose.   She presents for a HF follow-up visit, with her daughter, with a chief complaint of worsening shortness of breath. Has associated fatigue, weakness, occasional dizziness, worsening pedal edema, abdominal distention, continued hand cramps. Wearing compression socks. Has recently started receiving weekly iron  infusions.   Continues to smoke 1.5 ppd. Does take her oxygen  off when smoking and removes herself from close proximity of it.   ROS: All systems  negative except as listed in HPI, PMH and Problem List.  SH:  Social History   Socioeconomic History   Marital status: Divorced    Spouse name: Not on file    Number of children: 5   Years of education: Not on file   Highest education level: Associate degree: academic program  Occupational History   Occupation: Retired  Tobacco Use   Smoking status: Every Day    Current packs/day: 1.00    Average packs/day: 1 pack/day for 60.0 years (60.0 ttl pk-yrs)    Types: Cigarettes   Smokeless tobacco: Never  Vaping Use   Vaping status: Former   Substances: Nicotine  Substance and Sexual Activity   Alcohol use: Not Currently   Drug use: Not Currently   Sexual activity: Not Currently  Other Topics Concern   Not on file  Social History Narrative   Lives w/ 3 of her children   Social Drivers of Health   Financial Resource Strain: High Risk (05/07/2023)   Received from [facility]   Overall Financial Resource Strain (CARDIA)    Difficulty of Paying Living Expenses: Hard  Food Insecurity: No Food Insecurity (03/10/2024)   Hunger Vital Sign    Worried About Running Out of Food in the Last Year: Never true    Ran Out of Food in the Last Year: Never true  Transportation Needs: No Transportation Needs (03/10/2024)   PRAPARE - Administrator, Civil Service (Medical): No    Lack of Transportation (Non-Medical): No  Physical Activity: Not on file  Stress: Not on file  Social Connections: Socially Isolated (03/10/2024)   Social Connection and Isolation Panel    Frequency of Communication with Friends and Family: Three times a week    Frequency of Social Gatherings with Friends and Family: Three times a week    Attends Religious Services: Never    Active Member of Clubs or Organizations: No    Attends Banker Meetings: Never    Marital Status: Divorced  Catering Manager Violence: Not At Risk (03/10/2024)   Humiliation, Afraid, Rape, and Kick questionnaire    Fear of Current or Ex-Partner: No    Emotionally Abused: No    Physically Abused: No    Sexually Abused: No    FH:  Family History  Problem  Relation Age of Onset   Uterine cancer Mother    Stroke Mother    Aneurysm Mother    COPD Mother    Heart attack Father    Coronary artery disease Father    Leukemia Father    Depression Father    Diabetes Father    Hyperlipidemia Father    Hypertension Father    Alcohol abuse Father    Cancer Father    Heart disease Father    Colon polyps Sister    Obesity Sister    Thyroid  disease Sister    COPD Sister    Lung cancer Sister    Breast cancer Maternal Aunt    Breast cancer Maternal Aunt    Kidney disease Paternal Uncle     Past Medical History:  Diagnosis Date   Allergy    Anemia 11/23   Anxiety    Blood transfusion without reported diagnosis    CHF (congestive heart failure) (HCC)    Chronic heart failure with preserved ejection fraction (HFpEF) (HCC)    a. 02/2023 Echo: EF 60-65%, no rwma, GrII DD, nl RV size/fxn, mild LAE, mild MS (mean grad ),  mod MAC, mild AS (mean grad 9.43mmHg).   CKD (chronic kidney disease), stage III (HCC)    COPD (chronic obstructive pulmonary disease) (HCC)    Coronary artery disease    Depression    Diabetes mellitus without complication (HCC)    Emphysema of lung (HCC) 2010   Hyperlipidemia    Hypertension    Hypothyroidism    Interstitial lung disease (HCC)    PAD (peripheral artery disease)    a. 07/2017 L subclavian stenosis s/p stenting.   Psoriasis    Squamous cell carcinoma of skin 08/25/2023   SCC IS,  [facility], referral sent for Mohs Dr. Corey   Substance abuse [facility])    Tobacco abuse     Current Outpatient Medications  Medication Sig Dispense Refill   ACCU-CHEK GUIDE TEST test strip      Accu-Chek Softclix Lancets lancets SMARTSIG:Lancet Topical     albuterol  (VENTOLIN  HFA) 108 (90 Base) MCG/ACT inhaler Inhale 2 puffs into the lungs every 6 (six) hours as needed for wheezing or shortness of breath. 1 each 3   ALPRAZolam  (XANAX ) 0.5 MG tablet Take 0.5 mg by mouth 2 (two) times daily as needed for anxiety.       aspirin  81 MG tablet Take 81 mg by mouth at bedtime.      atorvastatin  (LIPITOR) 40 MG tablet Take 1 tablet (40 mg total) by mouth at bedtime. 30 tablet 0   bisacodyl  (DULCOLAX) 5 MG EC tablet Take 5 mg by mouth daily as needed for moderate constipation.     bisoprolol  (ZEBETA ) 5 MG tablet TAKE 1 TABLET (5 MG TOTAL) BY MOUTH DAILY. 90 tablet 1   budeson-glycopyrrolate -formoterol  (BREZTRI  AEROSPHERE) 160-9-4.8 MCG/ACT AERO inhaler Inhale 2 puffs into the lungs 2 (two) times daily.     Cholecalciferol  (VITAMIN D3) 75 MCG (3000 UT) TABS Take 3,000 Units by mouth once a week. Tuesday     Cyanocobalamin  (VITAMIN B-12) 500 MCG SUBL Place under the tongue daily.     ezetimibe  (ZETIA ) 10 MG tablet TAKE 1 TABLET BY MOUTH EVERY DAY 90 tablet 1   gabapentin  (NEURONTIN ) 100 MG capsule Take 200 mg by mouth at bedtime.     JARDIANCE  10 MG TABS tablet TAKE ONE TABLET BY MOUTH DAILY 90 tablet 2   levothyroxine  (SYNTHROID , LEVOTHROID) 125 MCG tablet Take 125 mcg by mouth daily.      metolazone  (ZAROXOLYN ) 2.5 MG tablet Take 1 tab weekly and an additional dose as directed by HF CLINIC 7 tablet 3   OXYGEN  Inhale 2 L/L into the lungs at bedtime.     pantoprazole  (PROTONIX ) 40 MG tablet Take 40 mg by mouth 2 (two) times daily.     potassium chloride  (KLOR-CON ) 10 MEQ tablet Take 30 mEq by mouth in the morning and at bedtime.     potassium chloride  SA (KLOR-CON  M20) 20 MEQ tablet Take 2 tablets (40 mEq total) by mouth daily. And additional 20meq with metolazone      prednisoLONE acetate (PRED FORTE) 1 % ophthalmic suspension Place 1 drop into both eyes 3 (three) times daily. Right eye one drop twice daily, left eye one drop three times daily     spironolactone  (ALDACTONE ) 25 MG tablet Take 1 tablet (25 mg total) by mouth daily. 30 tablet 0   temazepam  (RESTORIL ) 15 MG capsule Take 15 mg by mouth at bedtime as needed.     torsemide  (DEMADEX ) 20 MG tablet Take 3 tablets (60 mg total) by mouth daily. 270 tablet 3  traZODone  (DESYREL ) 100 MG tablet Take 100 mg by mouth daily as needed for sleep. (Patient taking differently: Take 100 mg by mouth at bedtime.)     venlafaxine  XR (EFFEXOR -XR) 75 MG 24 hr capsule Take 75 mg by mouth daily with breakfast.     No current facility-administered medications for this visit.   There were no vitals filed for this visit.  Wt Readings from Last 3 Encounters:  03/28/24 178 lb (80.7 kg)  03/10/24 177 lb (80.3 kg)  03/10/24 177 lb 9.6 oz (80.6 kg)   Lab Results  Component Value Date   CREATININE 2.40 (H) 03/11/2024   CREATININE 2.50 (H) 03/10/2024   CREATININE 2.31 (H) 03/10/2024    PHYSICAL EXAM:  General: Pale skin color Cor: Elevated JVD. Regular rhythm, bradycardic.  Lungs: rales in BLL, O2 @ 2L present Abdomen: soft, nontender, distended. Extremities: 2+ pitting edema BLE Neuro:. Affect pleasant   ECG: not done    ASSESSMENT & PLAN:  1: Ischemic heart failure with preserved ejection fraction- - cath with stent 03/26/23 - NYHA class III - fluid volume up with worsening symptoms & rales present, possibly due to being anemic - weight down 2 pounds from last visit here 2 weeks ago - Echo 03/09/23: EF 60-65% with Grade II DD, mild LAE, mild AS - continue bisoprolol  5mg  daily - continue jardiance  10mg  daily  - continue metolazone  2.5mg  weekly but take an additional dose today - continue potassium 40meq QD - continue spironolactone  25mg  daily - increase torsemide  to 60mg  daily - BMET, BNP, MG today as she continues to have cramping especially in her bilateral hands - not adding salt and has been looking at food labels - BNP 02/25/24 was 651.3  2: HTN- - BP 129/41 - saw PCP Ted) 10/25 - saw nephrology Jil) 07/25. Does not plan to return.  - BMP 02/25/24 reviewed: sodium 141, potassium 4.7, creatinine 2.16, GFR 24  3: CAD- - saw cardiology ([place]) 08/25 - continue clopidogrel  75mg  daily - continue ASA 81mg  daily - RHC/ LHC  03/26/23:   Prox RCA lesion is 45% stenosed.   CULPRIT LESION: Mid RCA lesion is 90% stenosed.   A drug-eluting stent was successfully placed using a STENT ONYX FRONTIER 3.0X22.   Post intervention, there is a 0% residual stenosis.   Hemodynamic findings consistent with mild pulmonary hypertension.  (PAP 49/24-33 mmHg, PCWP 24 mmHg transpulmonary Gradient 9 mmHg); LVEDP 16 mmHg.  4: COPD (managed by pulmonology)- - continue albuterol  inhaler PRN; she estimates that she uses this ~ once / month - continue breztri   - saw pulmonology Alica) 04/25 - wearing oxygen  at 2L around the clock  5: Anemia (managed by hematology)- - saw hematology Dyanna) 10/25 - iron  infusion done 03/07/24 - Hg 02/04/24 was 8.6 - CBC today to r/o worsening anemia contributing to her worsening HF symptoms. Will contact patient once lab results received.   6: Hyperlipidemia- - continue atorvastatin  40mg  daily - continue ezetimibe  10mg  daily - LDL 01/19/24 was 40 - lipo (a) 03/27/23 was 54.2  7: Tobacco use- - smoking 1.5 ppd of cigarettes - voices confirmation that she removes her oxygen  when smoking and removes herself away from the oxygen  - cessation encouraged   Return in 1 month, sooner if needed.   I spent 38 minutes reviewing records, interviewing/ examing patient and managing plan/ orders.   Ellouise Class, FNP-C 03/28/24

## 2024-03-29 ENCOUNTER — Ambulatory Visit: Admitting: Family

## 2024-03-29 LAB — SURGICAL PATHOLOGY

## 2024-03-30 ENCOUNTER — Telehealth

## 2024-03-30 DIAGNOSIS — L539 Erythematous condition, unspecified: Secondary | ICD-10-CM | POA: Diagnosis not present

## 2024-03-30 DIAGNOSIS — L03114 Cellulitis of left upper limb: Secondary | ICD-10-CM | POA: Diagnosis not present

## 2024-03-30 DIAGNOSIS — W548XXA Other contact with dog, initial encounter: Secondary | ICD-10-CM

## 2024-03-30 MED ORDER — AMOXICILLIN-POT CLAVULANATE 875-125 MG PO TABS: 1.0000 | ORAL_TABLET | Freq: Two times a day (BID) | ORAL | 0 refills | Status: AC

## 2024-03-30 MED ORDER — MUPIROCIN 2 % EX OINT: 1.0000 | TOPICAL_OINTMENT | Freq: Two times a day (BID) | CUTANEOUS | 0 refills | Status: AC

## 2024-03-30 NOTE — Patient Instructions (Addendum)
 Misty Adams, thank you for joining Misty CHRISTELLA Barefoot, NP for today's virtual visit.  While this provider is not your primary care provider (PCP), if your PCP is located in our provider database this encounter information will be shared with them immediately following your visit.   A [facility] MyChart account gives you access to today's visit and all your visits, tests, and labs performed at [facility]  click here if you don't have a [place] MyChart account or go to [email or web address]  Consent: (Patient) Misty Adams provided verbal consent for this virtual visit at the beginning of the encounter.  Current Medications:  Current Outpatient Medications:    amoxicillin-clavulanate (AUGMENTIN) 875-125 MG tablet, Take 1 tablet by mouth 2 (two) times daily for 7 days., Disp: 14 tablet, Rfl: 0   mupirocin ointment (BACTROBAN) 2 %, Apply 1 Application topically 2 (two) times daily., Disp: 22 g, Rfl: 0   ACCU-CHEK GUIDE TEST test strip, , Disp: , Rfl:    Accu-Chek Softclix Lancets lancets, SMARTSIG:Lancet Topical, Disp: , Rfl:    albuterol  (VENTOLIN  HFA) 108 (90 Base) MCG/ACT inhaler, Inhale 2 puffs into the lungs every 6 (six) hours as needed for wheezing or shortness of breath., Disp: 1 each, Rfl: 3   ALPRAZolam  (XANAX ) 0.5 MG tablet, Take 0.5 mg by mouth 2 (two) times daily as needed for anxiety. , Disp: , Rfl:    aspirin  81 MG tablet, Take 81 mg by mouth at bedtime. , Disp: , Rfl:    atorvastatin  (LIPITOR) 40 MG tablet, Take 1 tablet (40 mg total) by mouth at bedtime., Disp: 30 tablet, Rfl: 0   bisacodyl  (DULCOLAX) 5 MG EC tablet, Take 5 mg by mouth daily as needed for moderate constipation., Disp: , Rfl:    bisoprolol  (ZEBETA ) 5 MG tablet, TAKE 1 TABLET (5 MG TOTAL) BY MOUTH DAILY., Disp: 90 tablet, Rfl: 1   budeson-glycopyrrolate -formoterol  (BREZTRI  AEROSPHERE) 160-9-4.8 MCG/ACT AERO inhaler, Inhale 2 puffs into the lungs 2 (two) times daily., Disp: , Rfl:     Cholecalciferol  (VITAMIN D3) 75 MCG (3000 UT) TABS, Take 3,000 Units by mouth once a week. Tuesday, Disp: , Rfl:    Cyanocobalamin  (VITAMIN B-12) 500 MCG SUBL, Place under the tongue daily., Disp: , Rfl:    ezetimibe  (ZETIA ) 10 MG tablet, TAKE 1 TABLET BY MOUTH EVERY DAY, Disp: 90 tablet, Rfl: 1   gabapentin  (NEURONTIN ) 100 MG capsule, Take 200 mg by mouth at bedtime., Disp: , Rfl:    JARDIANCE  10 MG TABS tablet, TAKE ONE TABLET BY MOUTH DAILY, Disp: 90 tablet, Rfl: 2   levothyroxine  (SYNTHROID , LEVOTHROID) 125 MCG tablet, Take 125 mcg by mouth daily. , Disp: , Rfl:    metolazone  (ZAROXOLYN ) 2.5 MG tablet, Take 1 tab weekly and an additional dose as directed by HF CLINIC, Disp: 7 tablet, Rfl: 3   OXYGEN , Inhale 2 L/L into the lungs at bedtime., Disp: , Rfl:    pantoprazole  (PROTONIX ) 40 MG tablet, Take 40 mg by mouth 2 (two) times daily., Disp: , Rfl:    potassium chloride  (KLOR-CON ) 10 MEQ tablet, Take 30 mEq by mouth in the morning and at bedtime., Disp: , Rfl:    potassium chloride  SA (KLOR-CON  M20) 20 MEQ tablet, Take 2 tablets (40 mEq total) by mouth daily. And additional with metolazone , Disp: , Rfl:    prednisoLONE acetate (PRED FORTE) 1 % ophthalmic suspension, Place 1 drop into both eyes 3 (three) times daily. Right eye one drop twice daily,  left eye one drop three times daily, Disp: , Rfl:    spironolactone  (ALDACTONE ) 25 MG tablet, Take 1 tablet (25 mg total) by mouth daily., Disp: 30 tablet, Rfl: 0   temazepam  (RESTORIL ) 15 MG capsule, Take 15 mg by mouth at bedtime as needed., Disp: , Rfl:    torsemide  (DEMADEX ) 20 MG tablet, Take 3 tablets (60 mg total) by mouth daily., Disp: 270 tablet, Rfl: 3   traZODone  (DESYREL ) 100 MG tablet, Take 100 mg by mouth daily as needed for sleep. (Patient taking differently: Take 100 mg by mouth at bedtime.), Disp: , Rfl:    venlafaxine  XR (EFFEXOR -XR) 75 MG 24 hr capsule, Take 75 mg by mouth daily with breakfast., Disp: , Rfl:    Medications  ordered in this encounter:  Meds ordered this encounter  Medications   amoxicillin-clavulanate (AUGMENTIN) 875-125 MG tablet    Sig: Take 1 tablet by mouth 2 (two) times daily for 7 days.    Dispense:  14 tablet    Refill:  0    Supervising Provider:   BLAISE ALEENE KIDD [[id number]]   mupirocin ointment (BACTROBAN) 2 %    Sig: Apply 1 Application topically 2 (two) times daily.    Dispense:  22 g    Refill:  0    Supervising Provider:   BLAISE ALEENE KIDD [[id number]]     *If you need refills on other medications prior to your next appointment, please contact your pharmacy*  Follow-Up: Call back or seek an in-person evaluation if the symptoms worsen or if the condition fails to improve as anticipated.  [facility] [phone]  Other Instructions Wound Care, Adult Taking care of your wound properly can help to prevent pain, infection, and scarring. It can also help your wound heal more quickly. Follow instructions from your health care provider about how to care for your wound. Supplies needed: Soap and water. Wound cleanser, saline, or germ-free (sterile) water. Gauze. If needed, a clean bandage (dressing) or other type of wound dressing material to cover or place in the wound. Follow your health care provider's instructions about what dressing supplies to use. Cream or topical ointment to apply to the wound, if told by your health care provider. How to care for your wound Cleaning the wound Ask your health care provider how to clean the wound. This may include: Using mild soap and water, a wound cleanser, saline, or sterile water. Using a clean gauze to pat the wound dry after cleaning it. Do not rub or scrub the wound. Dressing care Wash your hands with soap and water for at least 20 seconds before and after you change the dressing. If soap and water are not available, use hand sanitizer. Change your dressing as told by your health care provider. This may  include: Cleaning or rinsing out (irrigating) the wound. Application of cream or topical ointment, if told by your health care provider. Placing a dressing over the wound or in the wound (packing). Covering the wound with an outer dressing. Leave stitches (sutures), staples, skin glue, or adhesive strips in place. These skin closures may need to stay in place for 2 weeks or longer. If adhesive strip edges start to loosen and curl up, you may trim the loose edges. Do not remove adhesive strips completely unless your health care provider tells you to do that. Ask your health care provider when you can leave the wound uncovered. Checking for infection Check your wound area every day for signs  of infection. Check for: More redness, swelling, or pain. Fluid or blood. Warmth. Pus or a bad smell.  Follow these instructions at home Medicines If you were prescribed an antibiotic medicine, cream, or ointment, take or apply it as told by your health care provider. Do not stop using the antibiotic even if your condition improves. If you were prescribed pain medicine, take it 30 minutes before you do any wound care or as told by your health care provider. Take over-the-counter and prescription medicines only as told by your health care provider. Eating and drinking Eat a diet that includes protein, vitamin A, vitamin C, and other nutrient-rich foods to help the wound heal. Foods rich in protein include meat, fish, eggs, dairy, beans, and nuts. Foods rich in vitamin A include carrots and dark green, leafy vegetables. Foods rich in vitamin C include citrus fruits, tomatoes, broccoli, and peppers. Drink enough fluid to keep your urine pale yellow. General instructions Do not take baths, swim, or use a hot tub until your health care provider approves. Ask your health care provider if you may take showers. You may only be allowed to take sponge baths. Do not scratch or pick at the wound. Keep it covered as  told by your health care provider. Return to your normal activities as told by your health care provider. Ask your health care provider what activities are safe for you. Protect your wound from the sun when you are outside for the first 6 months, or for as long as told by your health care provider. Cover up the scar area or apply sunscreen that has an SPF of at least 30. Do not use any products that contain nicotine or tobacco. These products include cigarettes, chewing tobacco, and vaping devices, such as e-cigarettes. If you need help quitting, ask your health care provider. Keep all follow-up visits. This is important. Contact a health care provider if: You received a tetanus shot and you have swelling, severe pain, redness, or bleeding at the injection site. Your pain is not controlled with medicine. You have any of these signs of infection: More redness, swelling, or pain around the wound. Fluid or blood coming from the wound. Warmth coming from the wound. A fever or chills. You are nauseous or you vomit. You are dizzy. You have a new rash or hardness around the wound. Get help right away if: You have a red streak of skin near the area around your wound. Pus or a bad smell coming from the wound. Your wound has been closed with staples, sutures, skin glue, or adhesive strips and it begins to open up and separate. Your wound is bleeding, and the bleeding does not stop with gentle pressure. These symptoms may represent a serious problem that is an emergency. Do not wait to see if the symptoms will go away. Get medical help right away. Call your local emergency services (911 in the [place] Do not drive yourself to the hospital. Summary Always wash your hands with soap and water for at least 20 seconds before and after changing your dressing. Change your dressing as told by your health care provider. To help with healing, eat foods that are rich in protein, vitamin A, vitamin C, and other  nutrients. Check your wound every day for signs of infection. Contact your health care provider if you think that your wound is infected. This information is not intended to replace advice given to you by your health care provider. Make sure you discuss any questions  you have with your health care provider. Document Revised: 08/14/2020 Document Reviewed: 08/14/2020 Elsevier Patient Education  2024 Elsevier Inc[employer]   If you have been instructed to have an in-person evaluation today at a local Urgent Care facility, please use the link below. It will take you to a list of all of our available [facility], including address, phone number and hours of operation. Please do not delay care.  [facility]  If you or a family member do not have a primary care provider, use the link below to schedule a visit and establish care. When you choose a [facility] primary care physician or advanced practice provider, you gain a long-term partner in health. Find a Primary Care Provider  Learn more about [facility]'s in-office and virtual care options: [place] - Get Care Now

## 2024-03-30 NOTE — Progress Notes (Signed)
 Virtual Visit Consent   Misty Adams, you are scheduled for a virtual visit with a [facility] provider today. Just as with appointments in the office, your consent must be obtained to participate. Your consent will be active for this visit and any virtual visit you may have with one of our providers in the next 365 days. If you have a MyChart account, a copy of this consent can be sent to you electronically.  As this is a virtual visit, video technology does not allow for your provider to perform a traditional examination. This may limit your provider's ability to fully assess your condition. If your provider identifies any concerns that need to be evaluated in person or the need to arrange testing (such as labs, EKG, etc.), we will make arrangements to do so. Although advances in technology are sophisticated, we cannot ensure that it will always work on either your end or our end. If the connection with a video visit is poor, the visit may have to be switched to a telephone visit. With either a video or telephone visit, we are not always able to ensure that we have a secure connection.  By engaging in this virtual visit, you consent to the provision of healthcare and authorize for your insurance to be billed (if applicable) for the services provided during this visit. Depending on your insurance coverage, you may receive a charge related to this service.  I need to obtain your verbal consent now. Are you willing to proceed with your visit today? Misty Adams has provided verbal consent on 03/30/2024 for a virtual visit (video or telephone). [name], NP  Date: 03/30/2024 11:01 AM   Virtual Visit via Video Note   I, [name], connected with  Misty Adams  ([id number], February 25, 1956) on 03/30/24 at 11:00 AM EST by a video-enabled telemedicine application and verified that I am speaking with the correct person using two identifiers.  Location: Patient: Virtual Visit Location  Patient: Home Provider: Virtual Visit Location Provider: Home Office   I discussed the limitations of evaluation and management by telemedicine and the availability of in person appointments. The patient expressed understanding and agreed to proceed.    History of Present Illness: Misty Adams is a 68 y.o. who identifies as a female who was assigned female at birth, and is being seen today for skin infection  Onset was last Thursday one of her dogs jumped on her and she got a tear on her arm. She places a Band-Aid one it- but when she removed the Band-Aid it pulled her skin with it, making the site larger. It started draining on Friday and started to turn red and worsen. She applied Tegaderm to help keep clean. She has drawn a marker line swelling and redness that has worsen- the area is now the size of palm. Appears to have some mild drainage from the open gash from dogs nail.  Modifying factors are keeping it clean, tegaderm, neosporin on it this last time.  Denies fevers, chills, no swelling or firm needs or warmth   Problems:  Patient Active Problem List   Diagnosis Date Noted   Anemia 03/10/2024   Tobacco abuse 10/19/2023   Acute exacerbation of CHF (congestive heart failure) (HCC) 10/19/2023   Iron  deficiency anemia secondary to blood loss (chronic) 07/07/2023   Hypotension due to medication 07/06/2023   Dependence on nocturnal oxygen  therapy 07/06/2023   Syncope and collapse 07/06/2023   Fall at home, initial  encounter 07/06/2023   Demand ischemia (HCC) 03/26/2023   CAD in native artery 03/26/2023   Pulmonary hypertension, unspecified (HCC) 03/25/2023   Acute on chronic respiratory failure with hypoxemia (HCC) 03/24/2023   Chronic kidney disease, stage 3b (HCC) 03/24/2023   CHF (congestive heart failure) (HCC) 03/23/2023   Mild aortic stenosis 03/15/2023   Acute hypokalemia 03/10/2023   Acute respiratory failure with hypoxia (HCC) 03/09/2023   Chronic heart failure with  preserved ejection fraction (HFpEF) (HCC) 03/09/2023   Dyslipidemia 03/09/2023   GERD without esophagitis 03/09/2023   Acute kidney injury superimposed on chronic kidney disease 03/09/2023   Pulmonary nodule 01/21/2023   Acute on chronic anemia 12/08/2022   Severe sepsis (HCC) 07/16/2022   Acute pyelonephritis 07/16/2022   Lactic acidosis 07/16/2022   Thrombocytopenia 07/16/2022   Essential hypertension 07/16/2022   Hyperlipidemia, unspecified 07/16/2022   Hypothyroidism 07/16/2022   Anxiety and depression 07/16/2022   Acute renal failure with acute tubular necrosis superimposed on stage 3b chronic kidney disease (HCC) 07/14/2022   Chronic obstructive pulmonary disease (HCC) 07/14/2022   Major depressive disorder, recurrent, 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   Pneumonia 09/21/2020   Pneumonia due to COVID-19 virus 09/20/2020   Aortic atherosclerosis 03/13/2020   Vitamin D  deficiency 02/04/2019   Medicare annual wellness visit, initial 10/19/2018   H/O herpes zoster 09/08/2018   B12 deficiency 01/22/2018   Tubular adenoma 11/16/2017   Chronic venous insufficiency 09/16/2017   Diabetes mellitus with peripheral vascular disease (HCC) 07/21/2017   PAD (peripheral artery disease) 07/21/2017   Subclavian artery stenosis, left 07/15/2017   Psoriasis 08/19/2013    Allergies:  Allergies  Allergen Reactions   Hydrocodone -Acetaminophen  Hives   Oxycodone  Nausea Only    GI upset, felt faint-per pt   Vicodin [Hydrocodone -Acetaminophen ] Hives   Trelegy Ellipta [Fluticasone-Umeclidin-Vilant] Rash    Changed to Budeson-Glycopyrrol-Formoterol  by provider   Medications:  Current Outpatient Medications:    ACCU-CHEK GUIDE TEST test strip, , Disp: , Rfl:    Accu-Chek Softclix Lancets lancets, SMARTSIG:Lancet Topical, Disp: , Rfl:    albuterol  (VENTOLIN  HFA) 108 (90 Base) MCG/ACT inhaler, Inhale 2 puffs into the lungs every 6 (six) hours as needed for wheezing or shortness of breath., Disp: 1 each, Rfl: 3    ALPRAZolam  (XANAX ) 0.5 MG tablet, Take 0.5 mg by mouth 2 (two) times daily as needed for anxiety. , Disp: , Rfl:    aspirin  81 MG tablet, Take 81 mg by mouth at bedtime. , Disp: , Rfl:    atorvastatin  (LIPITOR) 40 MG tablet, Take 1 tablet (40 mg total) by mouth at bedtime., Disp: 30 tablet, Rfl: 0   bisacodyl  (DULCOLAX) 5 MG EC tablet, Take 5 mg by mouth daily as needed for moderate constipation., Disp: , Rfl:    bisoprolol  (ZEBETA ) 5 MG tablet, TAKE 1 TABLET (5 MG TOTAL) BY MOUTH DAILY., Disp: 90 tablet, Rfl: 1   budeson-glycopyrrolate -formoterol  (BREZTRI  AEROSPHERE) 160-9-4.8 MCG/ACT AERO inhaler, Inhale 2 puffs into the lungs 2 (two) times daily., Disp: , Rfl:    Cholecalciferol  (VITAMIN D3) 75 MCG (3000 UT) TABS, Take 3,000 Units by mouth once a week. Tuesday, Disp: , Rfl:    Cyanocobalamin  (VITAMIN B-12) 500 MCG SUBL, Place under the tongue daily., Disp: , Rfl:    ezetimibe  (ZETIA ) 10 MG tablet, TAKE 1 TABLET BY MOUTH EVERY DAY, Disp: 90 tablet, Rfl: 1   gabapentin  (NEURONTIN ) 100 MG capsule, Take 200 mg by mouth at bedtime., Disp: , Rfl:    JARDIANCE  10 MG TABS tablet, TAKE ONE  TABLET BY MOUTH DAILY, Disp: 90 tablet, Rfl: 2   levothyroxine  (SYNTHROID , LEVOTHROID) 125 MCG tablet, Take 125 mcg by mouth daily. , Disp: , Rfl:    metolazone  (ZAROXOLYN ) 2.5 MG tablet, Take 1 tab weekly and an additional dose as directed by [facility] CLINIC, Disp: 7 tablet, Rfl: 3   OXYGEN , Inhale 2 L/L into the lungs at bedtime., Disp: , Rfl:    pantoprazole  (PROTONIX ) 40 MG tablet, Take 40 mg by mouth 2 (two) times daily., Disp: , Rfl:    potassium chloride  (KLOR-CON ) 10 MEQ tablet, Take 30 mEq by mouth in the morning and at bedtime., Disp: , Rfl:    potassium chloride  SA (KLOR-CON  M20) 20 MEQ tablet, Take 2 tablets (40 mEq total) by mouth daily. And additional with metolazone , Disp: , Rfl:    prednisoLONE acetate (PRED FORTE) 1 % ophthalmic suspension, Place 1 drop into both eyes 3 (three) times daily. Right eye  one drop twice daily, left eye one drop three times daily, Disp: , Rfl:    spironolactone  (ALDACTONE ) 25 MG tablet, Take 1 tablet (25 mg total) by mouth daily., Disp: 30 tablet, Rfl: 0   temazepam  (RESTORIL ) 15 MG capsule, Take 15 mg by mouth at bedtime as needed., Disp: , Rfl:    torsemide  (DEMADEX ) 20 MG tablet, Take 3 tablets (60 mg total) by mouth daily., Disp: 270 tablet, Rfl: 3   traZODone  (DESYREL ) 100 MG tablet, Take 100 mg by mouth daily as needed for sleep. (Patient taking differently: Take 100 mg by mouth at bedtime.), Disp: , Rfl:    venlafaxine  XR (EFFEXOR -XR) 75 MG 24 hr capsule, Take 75 mg by mouth daily with breakfast., Disp: , Rfl:   Observations/Objective: Patient is well-developed, well-nourished in no acute distress.  Resting comfortably  at home.  Head is normocephalic, atraumatic.  No labored breathing.  Speech is clear and coherent with logical content.  Patient is alert and oriented at baseline.  Redness and note 2-3 inch skin tear with 3-4 inches of redness spreading on forearm- marker line is defined, mild swelling   Assessment and Plan:  1. Redness   2. Cellulitis of left upper extremity (Primary)  - amoxicillin-clavulanate (AUGMENTIN) 875-125 MG tablet; Take 1 tablet by mouth 2 (two) times daily for 7 days.  Dispense: 14 tablet; Refill: 0 - mupirocin ointment (BACTROBAN) 2 %; Apply 1 Application topically 2 (two) times daily.  Dispense: 22 g; Refill: 0  3. Dog scratch  -given the injury and cause, and additionally injury post bandage skin tear- high risk ot skin infection - symptoms are progressing as such, coverage with Augmentin for dog scratch -in case saliva on nails or paw, and mupirocin for staph coverage.  -follow up if not showing improvement in next 24-48 hours or worsening  -wound care on AVS   Reviewed side effects, risks and benefits of medication.    Patient acknowledged agreement and understanding of the plan.   Past Medical, Surgical,  Social History, Allergies, and Medications have been Reviewed.  Reviewed side effects, risks and benefits of medication.    Patient acknowledged agreement and understanding of the plan.   Past Medical, Surgical, Social History, Allergies, and Medications have been Reviewed.     Follow Up Instructions: I discussed the assessment and treatment plan with the patient. The patient was provided an opportunity to ask questions and all were answered. The patient agreed with the plan and demonstrated an understanding of the instructions.  A copy of instructions were sent  to the patient via MyChart unless otherwise noted below.    The patient was advised to call back or seek an in-person evaluation if the symptoms worsen or if the condition fails to improve as anticipated.    [name], NP

## 2024-03-31 ENCOUNTER — Inpatient Hospital Stay

## 2024-03-31 VITALS — BP 129/53 | HR 63 | Temp 98.0°F | Resp 20

## 2024-03-31 DIAGNOSIS — I132 Hypertensive heart and chronic kidney disease with heart failure and with stage 5 chronic kidney disease, or end stage renal disease: Secondary | ICD-10-CM | POA: Diagnosis not present

## 2024-03-31 DIAGNOSIS — D649 Anemia, unspecified: Secondary | ICD-10-CM

## 2024-03-31 MED ORDER — IRON SUCROSE 20 MG/ML IV SOLN
200.0000 mg | Freq: Once | INTRAVENOUS | Status: AC
  Administered 2024-03-31: 200 mg via INTRAVENOUS

## 2024-04-05 ENCOUNTER — Encounter: Payer: Self-pay | Admitting: Internal Medicine

## 2024-04-05 ENCOUNTER — Ambulatory Visit: Payer: Self-pay | Admitting: Nurse Practitioner

## 2024-04-05 ENCOUNTER — Inpatient Hospital Stay

## 2024-04-05 ENCOUNTER — Inpatient Hospital Stay: Admitting: Internal Medicine

## 2024-04-05 VITALS — BP 142/53 | HR 62 | Temp 98.3°F | Ht 64.02 in | Wt 177.0 lb

## 2024-04-05 DIAGNOSIS — D649 Anemia, unspecified: Secondary | ICD-10-CM | POA: Diagnosis not present

## 2024-04-05 DIAGNOSIS — D500 Iron deficiency anemia secondary to blood loss (chronic): Secondary | ICD-10-CM

## 2024-04-05 DIAGNOSIS — I132 Hypertensive heart and chronic kidney disease with heart failure and with stage 5 chronic kidney disease, or end stage renal disease: Secondary | ICD-10-CM | POA: Diagnosis not present

## 2024-04-05 DIAGNOSIS — D631 Anemia in chronic kidney disease: Secondary | ICD-10-CM

## 2024-04-05 LAB — CBC WITH DIFFERENTIAL (CANCER CENTER ONLY)
Abs Immature Granulocytes: 0.1 K/uL — ABNORMAL HIGH (ref 0.00–0.07)
Basophils Absolute: 0.1 K/uL (ref 0.0–0.1)
Basophils Relative: 1 %
Eosinophils Absolute: 0.2 K/uL (ref 0.0–0.5)
Eosinophils Relative: 2 %
HCT: 36.6 % (ref 36.0–46.0)
Hemoglobin: 10.2 g/dL — ABNORMAL LOW (ref 12.0–15.0)
Immature Granulocytes: 1 %
Lymphocytes Relative: 14 %
Lymphs Abs: 1.6 K/uL (ref 0.7–4.0)
MCH: 23.9 pg — ABNORMAL LOW (ref 26.0–34.0)
MCHC: 27.9 g/dL — ABNORMAL LOW (ref 30.0–36.0)
MCV: 85.9 fL (ref 80.0–100.0)
Monocytes Absolute: 0.8 K/uL (ref 0.1–1.0)
Monocytes Relative: 7 %
Neutro Abs: 8.5 K/uL — ABNORMAL HIGH (ref 1.7–7.7)
Neutrophils Relative %: 75 %
Platelet Count: 295 K/uL (ref 150–400)
RBC: 4.26 MIL/uL (ref 3.87–5.11)
RDW: 24.8 % — ABNORMAL HIGH (ref 11.5–15.5)
WBC Count: 11.3 K/uL — ABNORMAL HIGH (ref 4.0–10.5)
nRBC: 0 % (ref 0.0–0.2)

## 2024-04-05 LAB — BASIC METABOLIC PANEL WITH GFR
Anion gap: 14 (ref 5–15)
BUN: 42 mg/dL — ABNORMAL HIGH (ref 8–23)
CO2: 28 mmol/L (ref 22–32)
Calcium: 9.8 mg/dL (ref 8.9–10.3)
Chloride: 100 mmol/L (ref 98–111)
Creatinine, Ser: 2.35 mg/dL — ABNORMAL HIGH (ref 0.44–1.00)
GFR, Estimated: 22 mL/min — ABNORMAL LOW (ref >60)
Glucose, Bld: 92 mg/dL (ref 70–99)
Potassium: 4.6 mmol/L (ref 3.5–5.1)
Sodium: 141 mmol/L (ref 135–145)

## 2024-04-05 LAB — IRON AND TIBC
Iron: 41 ug/dL (ref 28–170)
Saturation Ratios: 11 % (ref 10.4–31.8)
TIBC: 386 ug/dL (ref 250–450)
UIBC: 345 ug/dL

## 2024-04-05 LAB — FERRITIN: Ferritin: 87 ng/mL (ref 11–307)

## 2024-04-05 MED ORDER — IRON SUCROSE 20 MG/ML IV SOLN
200.0000 mg | Freq: Once | INTRAVENOUS | Status: AC
  Administered 2024-04-05: 16:00:00 200 mg via INTRAVENOUS

## 2024-04-05 NOTE — Patient Instructions (Signed)

## 2024-04-05 NOTE — Progress Notes (Signed)
 [facility] CONSULT NOTE  Patient Care Team: Cleotilde Oneil FALCON, MD as PCP - General (Internal Medicine) Darron Deatrice LABOR, MD as PCP - Cardiology (Cardiology) Rennie Cindy SAUNDERS, MD as Consulting Physician (Oncology)  CHIEF COMPLAINTS/PURPOSE OF CONSULTATION: ANEMIA   HEMATOLOGY HISTORY  # ANEMIA[Hb; MCV-platelets- WBC; Iron  sat; ferritin;  Kidney: stage III CKD- 30-40s.  CT/US - ;  colo/EGD: 2022;  EGD/colo- awaiting repeating EGD/Dr.Russow-   COPD with ongoing tobacco abuse, abnormal CT chest consistent with interstitial lung disease with recent pulmonary consultation, depression, diabetes, hypertension, PAD  HISTORY OF PRESENTING ILLNESS: Patient ambulating-independently. With family.    Warren G Alguire 68 y.o.  female pleasant patient with multiple medical problems-including CKD stage IV CAD on aspirin  Plavix  and CHF and history of severeiron deficient anemia sec to AVMs is here for follow-up.  Discussed the use of AI scribe software for clinical note transcription with the patient, who gave verbal consent to proceed.  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  She has chronic anemia with baseline hemoglobin levels in the 7-8 g/dL range, attributed to gastrointestinal blood loss from angiodysplasia and advanced chronic kidney disease. Over the past several weeks, she received two blood transfusions and nearly weekly intravenous iron  infusions, resulting in an improvement of her hemoglobin to 10.2 g/dL, the highest level in the past year. She does not tolerate oral iron  due to gastrointestinal upset and continues to require ongoing intravenous iron  to maintain her hemoglobin.  She reports dark stools and observed  hematochezia once. Recent endoscopic evaluation, including colonoscopy and small bowel endoscopy, identified small polyps, non-bleeding hemorrhoids, and angiodysplasia in the small intestine, but no active gastrointestinal bleeding. She denies new or worsening symptoms related to gastrointestinal bleeding.  She has stage 5 chronic kidney disease with a most recent GFR of 20 and persistently elevated creatinine. No new symptoms related to renal function were discussed.  She has a known heart murmur without perceived change in character or intensity and denies new cardiac symptoms.  She has no additional questions or concerns.      Review of Systems  Constitutional:  Positive for malaise/fatigue. Negative for chills, diaphoresis, fever and weight loss.  HENT:  Negative for nosebleeds and sore throat.   Eyes:  Negative for double vision.  Respiratory:  Negative for cough, hemoptysis, sputum production, shortness of breath and wheezing.   Cardiovascular:  Negative for chest pain, palpitations, orthopnea and leg swelling.  Gastrointestinal:  Negative for abdominal pain, blood in stool, constipation, diarrhea, heartburn, melena, nausea and vomiting.  Genitourinary:  Negative for dysuria, frequency and urgency.  Musculoskeletal:  Negative for back pain and joint pain.  Skin: Negative.  Negative for itching and rash.  Neurological:  Negative for dizziness, tingling, focal weakness, weakness and headaches.  Endo/Heme/Allergies:  Does not bruise/bleed easily.  Psychiatric/Behavioral:  Negative for depression. The patient is not nervous/anxious and does not have insomnia.    MEDICAL HISTORY:  Past Medical History:  Diagnosis Date   Allergy    Anemia 11/23   Anxiety    Blood transfusion without reported diagnosis    CHF (congestive heart failure) (HCC)    Chronic heart failure with preserved ejection fraction (HFpEF) (HCC)    a. 02/2023 Echo: EF 60-65%, no rwma, GrII DD, nl RV size/fxn, mild LAE,  mild MS (mean grad ), mod MAC, mild AS (mean grad 9.77mmHg).   CKD (chronic kidney disease), stage III (HCC)    COPD (chronic obstructive pulmonary disease) (HCC)    Coronary artery disease    Depression    Diabetes mellitus without complication (HCC)    Emphysema of lung (HCC) 2010   Hyperlipidemia    Hypertension    Hypothyroidism    Interstitial lung disease (HCC)    PAD (peripheral artery disease)    a. 07/2017 L subclavian stenosis s/p stenting.   Psoriasis    Squamous cell carcinoma of skin 08/25/2023   SCC IS,  [facility], referral sent for Mohs Dr. Corey   Substance abuse [facility])    Tobacco abuse     SURGICAL HISTORY: Past Surgical History:  Procedure Laterality Date   ABDOMINAL HYSTERECTOMY     AORTIC ARCH ANGIOGRAPHY N/A 08/06/2017   Procedure: AORTIC ARCH ANGIOGRAPHY;  Surgeon: Laurence Redell CROME, MD;  Location: [facility] INVASIVE CV LAB;  Service: Cardiovascular;  Laterality: N/A;   APPENDECTOMY     BIOPSY  12/19/2022   Procedure: BIOPSY;  Surgeon: Onita Elspeth Sharper, DO;  Location: [facility] ENDOSCOPY;  Service: Gastroenterology;;   CESAREAN SECTION     COLONOSCOPY     COLONOSCOPY N/A 03/28/2024   Procedure: COLONOSCOPY;  Surgeon: Onita Elspeth Sharper, DO;  Location: [facility] ENDOSCOPY;  Service: Gastroenterology;  Laterality: N/A;  DM and patient on Plavix    COLONOSCOPY WITH PROPOFOL  N/A 12/07/2020   Procedure: COLONOSCOPY WITH PROPOFOL ;  Surgeon: Onita Elspeth Sharper, DO;  Location: [facility] ENDOSCOPY;  Service: Gastroenterology;  Laterality: N/A;   COLONOSCOPY WITH PROPOFOL  N/A 12/19/2022   Procedure: COLONOSCOPY WITH PROPOFOL ;  Surgeon: Onita Elspeth Sharper, DO;  Location: [facility] ENDOSCOPY;  Service: Gastroenterology;  Laterality: N/A;   CORONARY STENT INTERVENTION N/A 03/26/2023   Procedure: CORONARY STENT INTERVENTION;  Surgeon: Anner Alm ORN, MD;  Location: [facility] INVASIVE CV LAB;  Service: Cardiovascular;  Laterality: N/A;   dilatation and curettage     DILATION AND CURETTAGE OF  UTERUS     ENTEROSCOPY N/A 03/28/2024   Procedure: ENTEROSCOPY;  Surgeon: Onita Elspeth Sharper, DO;  Location: [facility] ENDOSCOPY;  Service: Gastroenterology;  Laterality: N/A;   ESOPHAGOGASTRODUODENOSCOPY N/A 12/07/2020   Procedure: ESOPHAGOGASTRODUODENOSCOPY (EGD);  Surgeon: Onita Elspeth Sharper, DO;  Location: [facility] ENDOSCOPY;  Service: Gastroenterology;  Laterality: N/A;   ESOPHAGOGASTRODUODENOSCOPY N/A 03/28/2024   Procedure: EGD (ESOPHAGOGASTRODUODENOSCOPY);  Surgeon: Onita Elspeth Sharper, DO;  Location: [facility] ENDOSCOPY;  Service: Gastroenterology;  Laterality: N/A;  EGD with Enteroscopy   ESOPHAGOGASTRODUODENOSCOPY (EGD) WITH PROPOFOL  N/A 12/19/2022   Procedure: ESOPHAGOGASTRODUODENOSCOPY (EGD) WITH PROPOFOL ;  Surgeon: Onita Elspeth Sharper, DO;  Location: [facility] ENDOSCOPY;  Service: Gastroenterology;  Laterality: N/A;   HOT HEMOSTASIS  03/28/2024   Procedure: EGD, WITH ARGON PLASMA COAGULATION;  Surgeon: Onita Elspeth Sharper, DO;  Location: [facility] ENDOSCOPY;  Service: Gastroenterology;;   KNEE SURGERY     left wrist ganglion cyst removal     OOPHORECTOMY  1994   PERIPHERAL VASCULAR INTERVENTION Left 08/06/2017   Procedure: PERIPHERAL VASCULAR INTERVENTION;  Surgeon: Laurence Redell CROME, MD;  Location: [facility] INVASIVE CV LAB;  Service: Cardiovascular;  Laterality: Left;  subclavian   POLYPECTOMY  12/19/2022   Procedure: POLYPECTOMY;  Surgeon: Onita Elspeth Sharper, DO;  Location: [facility] ENDOSCOPY;  Service: Gastroenterology;;   POLYPECTOMY  03/28/2024   Procedure: POLYPECTOMY, INTESTINE;  Surgeon: Onita Elspeth Sharper, DO;  Location: [facility] ENDOSCOPY;  Service: Gastroenterology;;   RIGHT/LEFT HEART CATH AND CORONARY ANGIOGRAPHY N/A 03/26/2023  Procedure: RIGHT/LEFT HEART CATH AND CORONARY ANGIOGRAPHY;  Surgeon: Anner Alm ORN, MD;  Location: [facility] INVASIVE CV LAB;  Service: Cardiovascular;  Laterality: N/A;   TUBAL LIGATION      SOCIAL HISTORY: Social History   Socioeconomic History   Marital status:  Divorced    Spouse name: Not on file   Number of children: 5   Years of education: Not on file   Highest education level: Associate degree: academic program  Occupational History   Occupation: Retired  Tobacco Use   Smoking status: Every Day    Current packs/day: 1.00    Average packs/day: 1 pack/day for 60.0 years (60.0 ttl pk-yrs)    Types: Cigarettes   Smokeless tobacco: Never  Vaping Use   Vaping status: Former   Substances: Nicotine  Substance and Sexual Activity   Alcohol use: Not Currently   Drug use: Not Currently   Sexual activity: Not Currently  Other Topics Concern   Not on file  Social History Narrative   Lives w/ 3 of her children   Social Drivers of Health   Tobacco Use: High Risk (04/05/2024)   Patient History    Smoking Tobacco Use: Every Day    Smokeless Tobacco Use: Never    Passive Exposure: Not on file  Financial Resource Strain: High Risk (05/07/2023)   Received from [facility]   Overall Financial Resource Strain (CARDIA)    Difficulty of Paying Living Expenses: Hard  Food Insecurity: No Food Insecurity (03/10/2024)   Epic    Worried About Radiation Protection Practitioner of Food in the Last Year: Never true    Ran Out of Food in the Last Year: Never true  Transportation Needs: No Transportation Needs (03/10/2024)   Epic    Lack of Transportation (Medical): No    Lack of Transportation (Non-Medical): No  Physical Activity: Not on file  Stress: Not on file  Social Connections: Socially Isolated (03/10/2024)   Social Connection and Isolation Panel    Frequency of Communication with Friends and Family: Three times a week    Frequency of Social Gatherings with Friends and Family: Three times a week    Attends Religious Services: Never    Active Member of Clubs or Organizations: No    Attends Banker Meetings: Never    Marital Status: Divorced  Catering Manager Violence: Not At Risk (03/10/2024)   Epic    Fear of Current or Ex-Partner:  No    Emotionally Abused: No    Physically Abused: No    Sexually Abused: No  Depression (PHQ2-9): Low Risk (04/05/2024)   Depression (PHQ2-9)    PHQ-2 Score: 0  Alcohol Screen: Low Risk (03/24/2023)   Alcohol Screen    Last Alcohol Screening Score (AUDIT): 1  Housing: Low Risk (03/10/2024)   Epic    Unable to Pay for Housing in the Last Year: No    Number of Times Moved in the Last Year: 0    Homeless in the Last Year: No  Utilities: Not At Risk (03/10/2024)   Epic    Threatened with loss of utilities: No  Health Literacy: Not on file    FAMILY HISTORY: Family History  Problem Relation Age of Onset   Uterine cancer Mother    Stroke Mother    Aneurysm Mother    COPD Mother    Heart attack Father    Coronary artery disease Father    Leukemia Father    Depression Father    Diabetes  Father    Hyperlipidemia Father    Hypertension Father    Alcohol abuse Father    Cancer Father    Heart disease Father    Colon polyps Sister    Obesity Sister    Thyroid  disease Sister    COPD Sister    Lung cancer Sister    Breast cancer Maternal Aunt    Breast cancer Maternal Aunt    Kidney disease Paternal Uncle     ALLERGIES:  is allergic to hydrocodone -acetaminophen , oxycodone , vicodin [hydrocodone -acetaminophen ], and trelegy ellipta [fluticasone-umeclidin-vilant].  MEDICATIONS:  Current Outpatient Medications  Medication Sig Dispense Refill   ACCU-CHEK GUIDE TEST test strip      Accu-Chek Softclix Lancets lancets SMARTSIG:Lancet Topical     albuterol  (VENTOLIN  HFA) 108 (90 Base) MCG/ACT inhaler Inhale 2 puffs into the lungs every 6 (six) hours as needed for wheezing or shortness of breath. 1 each 3   ALPRAZolam  (XANAX ) 0.5 MG tablet Take 0.5 mg by mouth 2 (two) times daily as needed for anxiety.      amoxicillin -clavulanate (AUGMENTIN ) 875-125 MG tablet Take 1 tablet by mouth 2 (two) times daily for 7 days. 14 tablet 0   aspirin  81 MG tablet Take 81 mg by mouth at bedtime.       atorvastatin  (LIPITOR) 40 MG tablet Take 1 tablet (40 mg total) by mouth at bedtime. 30 tablet 0   bisacodyl  (DULCOLAX) 5 MG EC tablet Take 5 mg by mouth daily as needed for moderate constipation.     bisoprolol  (ZEBETA ) 5 MG tablet TAKE 1 TABLET (5 MG TOTAL) BY MOUTH DAILY. 90 tablet 1   budeson-glycopyrrolate -formoterol  (BREZTRI  AEROSPHERE) 160-9-4.8 MCG/ACT AERO inhaler Inhale 2 puffs into the lungs 2 (two) times daily.     Cholecalciferol  (VITAMIN D3) 75 MCG (3000 UT) TABS Take 3,000 Units by mouth once a week. Tuesday     Cyanocobalamin  (VITAMIN B-12) 500 MCG SUBL Place under the tongue daily.     ezetimibe  (ZETIA ) 10 MG tablet TAKE 1 TABLET BY MOUTH EVERY DAY 90 tablet 1   gabapentin  (NEURONTIN ) 100 MG capsule Take 200 mg by mouth at bedtime.     JARDIANCE  10 MG TABS tablet TAKE ONE TABLET BY MOUTH DAILY 90 tablet 2   levothyroxine  (SYNTHROID , LEVOTHROID) 125 MCG tablet Take 125 mcg by mouth daily.      metolazone  (ZAROXOLYN ) 2.5 MG tablet Take 1 tab weekly and an additional dose as directed by HF CLINIC 7 tablet 3   mupirocin  ointment (BACTROBAN ) 2 % Apply 1 Application topically 2 (two) times daily. 22 g 0   OXYGEN  Inhale 2 L/L into the lungs at bedtime.     pantoprazole  (PROTONIX ) 40 MG tablet Take 40 mg by mouth 2 (two) times daily.     potassium chloride  (KLOR-CON ) 10 MEQ tablet Take 30 mEq by mouth in the morning and at bedtime.     spironolactone  (ALDACTONE ) 25 MG tablet Take 1 tablet (25 mg total) by mouth daily. 30 tablet 0   temazepam  (RESTORIL ) 15 MG capsule Take 15 mg by mouth at bedtime as needed.     torsemide  (DEMADEX ) 20 MG tablet Take 3 tablets (60 mg total) by mouth daily. 270 tablet 3   traZODone  (DESYREL ) 100 MG tablet Take 100 mg by mouth daily as needed for sleep. (Patient taking differently: Take 100 mg by mouth at bedtime.)     venlafaxine  XR (EFFEXOR -XR) 75 MG 24 hr capsule Take 75 mg by mouth daily with breakfast.  potassium chloride  SA (KLOR-CON  M20) 20 MEQ  tablet Take 2 tablets (40 mEq total) by mouth daily. And additional 20meq with metolazone      No current facility-administered medications for this visit.     PHYSICAL EXAMINATION:   Vitals:   04/05/24 1430 04/05/24 1503  BP: (!) 151/50 (!) 142/53  Pulse: 62   Temp: 98.3 F (36.8 C)   SpO2: 94%      Filed Weights   04/05/24 1430  Weight: 177 lb (80.3 kg)      Physical Exam Vitals and nursing note reviewed.  HENT:     Head: Normocephalic and atraumatic.     Mouth/Throat:     Pharynx: Oropharynx is clear.  Eyes:     Extraocular Movements: Extraocular movements intact.     Pupils: Pupils are equal, round, and reactive to light.  Cardiovascular:     Rate and Rhythm: Normal rate and regular rhythm.     Heart sounds: Murmur heard.  Pulmonary:     Comments: Decreased breath sounds bilaterally.  Abdominal:     Palpations: Abdomen is soft.  Musculoskeletal:        General: Normal range of motion.     Cervical back: Normal range of motion.  Skin:    General: Skin is warm.  Neurological:     General: No focal deficit present.     Mental Status: She is alert and oriented to person, place, and time.  Psychiatric:        Behavior: Behavior normal.        Judgment: Judgment normal.      LABORATORY DATA:  I have reviewed the data as listed Lab Results  Component Value Date   WBC 11.3 (H) 04/05/2024   HGB 10.2 (L) 04/05/2024   HCT 36.6 04/05/2024   MCV 85.9 04/05/2024   PLT 295 04/05/2024   Recent Labs    07/06/23 2211 07/07/23 0207 03/10/24 1535 03/11/24 0444 04/05/24 1436  NA 134*   < > 139 137 141  K 2.4*   < > 3.8 3.2* 4.6  CL 94*   < > 98 94* 100  CO2 27   < > 29 30 28   GLUCOSE 137*   < > 93 106* 92  BUN 69*   < > 46* 48* 42*  CREATININE 2.15*   < > 2.50* 2.40* 2.35*  CALCIUM  9.4   < > 9.5 9.3 9.8  GFRNONAA 24*   < > 20* 21* 22*  PROT 6.6  --  6.8  --   --   ALBUMIN  3.5  --  3.9  --   --   AST 10*  --  10*  --   --   ALT 9  --  7  --   --    ALKPHOS 55  --  88  --   --   BILITOT 0.6  --  0.3  --   --    < > = values in this interval not displayed.     DG Chest Port 1 View Result Date: 03/10/2024 CLINICAL DATA:  Dyspnea EXAM: PORTABLE CHEST 1 VIEW COMPARISON:  Chest x-ray 10/19/2023 FINDINGS: The heart is enlarged. There central pulmonary vascular congestion. There are interstitial opacities in the lung bases. There is no lung consolidation, pleural effusion or pneumothorax. There are atherosclerotic calcifications of the aorta. Vascular stent is seen in the superior left mediastinum, unchanged. No acute fractures are identified. IMPRESSION: Cardiomegaly with central pulmonary vascular congestion and interstitial edema.  Electronically Signed   By: Greig Pique M.D.   On: 03/10/2024 16:34     ASSESSMENT & PLAN:   Acute on chronic anemia    Symptomatic anemia # Anemia- Hb-8.3; Ferritin- 6 [PCP- AUG 2024.]- WBC/platelets are normal. Patient is symptomatic.  Likely due to iron  deficiency - from etiology GI blood loss/ CKD- IV.  NOT  gentle iron  [ constipation]   #  Currently s/p Venofer ; Hb 10.2 proceed with venofer -  #Etiology of iron  deficiency:s/p AUG 2024-[[facility]]-EGD-duodenal ulcer;  colonoscopy- multiple polyps; JULY 2024- CT scan abdomen pelvis- no acute process.  S/p APRIL 2025- capsule study- Video capsule study DEC 2025- colo/EGD- 2025 with several small AVMs scattered throughout small bowel, not actively bleeding.   # CAD s/p stent- CHF [dec 2024; march 2025, Dr.Arida] asprin + plavix   # CKD- IV [PCP]/ DM - on Toesermide/Spirinolactine weight gain- defer to Dr.Singh.    # DISPOSITION: # venfoer today # venofer - every 2 weeks- x 2 more # Follow up 3  months- MD; labs- cbc/bmp;iron  studies; ferritin-  possible venofer  OR Retacrit-  Dr.B  All questions were answered. The patient knows to call the clinic with any problems, questions or concerns.   Cindy JONELLE Joe, MD 04/05/2024 3:38 PM

## 2024-04-05 NOTE — Assessment & Plan Note (Signed)
#   Anemia- Hb-8.3; Ferritin- 6 [PCP- AUG 2024.]- WBC/platelets are normal. Patient is symptomatic.  Likely due to iron  deficiency - from etiology GI blood loss/ CKD- IV.  NOT  gentle iron  [ constipation]   #  Currently s/p Venofer ; Hb 10.2 proceed with venofer -  #Etiology of iron  deficiency:s/p AUG 2024-[[facility]]-EGD-duodenal ulcer;  colonoscopy- multiple polyps; JULY 2024- CT scan abdomen pelvis- no acute process.  S/p APRIL 2025- capsule study- Video capsule study DEC 2025- colo/EGD- 2025 with several small AVMs scattered throughout small bowel, not actively bleeding.   # CAD s/p stent- CHF [dec 2024; march 2025, Dr.Arida] asprin + plavix   # CKD- IV [PCP]/ DM - on Toesermide/Spirinolactine weight gain- defer to Dr.Singh.    # DISPOSITION: # venfoer today # venofer - every 2 weeks- x 2 more # Follow up 3  months- MD; labs- cbc/bmp;iron  studies; ferritin-  possible venofer  OR Retacrit-  Dr.B

## 2024-04-05 NOTE — Assessment & Plan Note (Addendum)
 SABRA

## 2024-04-06 ENCOUNTER — Ambulatory Visit: Admitting: Family

## 2024-04-18 ENCOUNTER — Inpatient Hospital Stay

## 2024-04-18 ENCOUNTER — Telehealth: Payer: Self-pay | Admitting: Family

## 2024-04-18 VITALS — BP 165/53 | HR 64 | Temp 97.0°F | Resp 20

## 2024-04-18 DIAGNOSIS — I132 Hypertensive heart and chronic kidney disease with heart failure and with stage 5 chronic kidney disease, or end stage renal disease: Secondary | ICD-10-CM | POA: Diagnosis not present

## 2024-04-18 DIAGNOSIS — D649 Anemia, unspecified: Secondary | ICD-10-CM

## 2024-04-18 MED ORDER — IRON SUCROSE 20 MG/ML IV SOLN
200.0000 mg | Freq: Once | INTRAVENOUS | Status: AC
  Administered 2024-04-18: 200 mg via INTRAVENOUS
  Filled 2024-04-18: qty 10

## 2024-04-18 MED ORDER — SODIUM CHLORIDE 0.9% FLUSH
10.0000 mL | Freq: Once | INTRAVENOUS | Status: AC | PRN
  Administered 2024-04-18: 10 mL
  Filled 2024-04-18: qty 10

## 2024-04-18 NOTE — Progress Notes (Unsigned)
 "   Advanced Heart Failure Clinic Note    PCP: Cleotilde Oneil FALCON, MD  Cardiologist: [name], MD/ Loistine Sober, NP   Chief Complaint: shortness of breath   HPI:  Misty Adams is a 68 y/o female with a history of COPD, HTN, hyperlipidemia, mild pHTN, GERD, subclavian artery stenosis (stented 07/2017), CKD, pulmonary HTN, CAD, iron  deficiency anemia, hypothyroidism, PVD, tobacco use and chronic heart failure.   Admitted 07/14/22 with E. coli UTI, severe sepsis, and AKI. High-sensitivity troponin rose to 56. She did not undergo cardiology evaluation. In August 2024 CT chest performed secondary to weight loss showed aortic atherosclerosis, unchanged 4 mm right apical pulmonary nodule, enlarged pulmonary trunk consistent with PAH, left adrenal adenoma.   Admitted 03/08/23 due to waking up short of breath & orthopnea after returning from a cruise. BNP 710. CXR showed heart failure. Initially placed on bipap and weaned down to nasal cannula. IV lasix  given. Hypokalemia corrected. Echocardiogram performed on 03/08/2023 showed ejection fraction 60 to 65% with grade 2 diastolic dysfunction. Symptoms improved.   Admitted 03/23/23 due to worsening of shortness of breath, leg swelling and significant weight gain. Developed hypoxia and placed on 5L nasal cannula. BNP elevated and given IV lasix . Cardiology consulted. Chest x-ray showed evidence of pulmonary edema with pleural effusion. CT shows signs consistent with pulmonary hypertension. Oxygen  weaned down to 2L. L and R heart cath 12/5 with stent placed to RCA. Hypokalemia corrected.   RHC/ LHC 03/26/23:   Prox RCA lesion is 45% stenosed.   CULPRIT LESION: Mid RCA lesion is 90% stenosed.   A drug-eluting stent was successfully placed using a STENT ONYX FRONTIER 3.0X22.   Post intervention, there is a 0% residual stenosis.   -------------------------------------   Hemodynamic findings consistent with mild pulmonary hypertension.  (PAP 49/24-33  mmHg, PCWP 24 mmHg transpulmonary Gradient 9 mmHg); LVEDP 16 mmHg.   Compensated CHF: LVEDP 16 mmHg, PCWP 24 mmHg.   Recommend uninterrupted dual antiplatelet therapy with Aspirin  81mg  daily and Clopidogrel  75mg  daily for a minimum of 12 months (ACS-Class I recommendation).  Admitted 07/06/23 with lightheadedness and multiple presyncopal episodes in preceding 24 hours leading to a fall on her lower back. Hypotensive in ED 95/45. RBC transfusion given for hemoglobin of 6.6. Was also given IV iron  infusion. No injuries noted on imaging in the ED. Given IVF for hypotension. GI consulted and will have outpatient follow-up.   Seen in [facility] 04/25 and spironolactone  12.5mg  daily was started.   Admitted 10/19/23 with progressive lower extremity swelling, weight gain, dyspnea, orthopnea, cough. Had doubled her torsemide  at home without any improvement. On admission, BNP was 851.2, HS-troponin was 8, and iron  studies pending. Chest x-ray noted central vascular congestion with small left effusion. IV diuresed.   Had called [facility] 10/25/23 with weight gain and had diuretic increased.   Seen in [facility] 02/25/24 being volume up after going to the beach and not having metolazone  for 2 weeks due to pharmacy telling her it couldn't be filled. Was given metolazone  2.5mg  daily X 2 days & then to resume weekly dose.   Admitted 03/10/24 with fatigue and dark stools. Was seen in [facility] 03/10/24 and after labs resulted Hg of 6.4, she was sent to the ER. Given 2 units PRBC's with IV lasix  after each unit. Cardiology ok'd stoppage of plavix  as she was only 2 weeks shy of 1 year since DES placed.   Endoscopy / colonoscopy done 03/28/24 with removal of polyps.   She presents for  a HF follow-up visit, with her daughter, with a chief complaint of worsening shortness of breath. Has associated fatigue, productive cough, abdominal distention, slight weight gain, head congestion. Denies chest pain, palpitations, pedal edema, dizziness.   Had iron   infusion done yesterday   Continues to smoke 1.5-2 ppd. Does take her oxygen  off when smoking and removes herself from close proximity of it.   ROS: All systems negative except as listed in HPI, PMH and Problem List.  SH:  Social History   Socioeconomic History   Marital status: Divorced    Spouse name: Not on file   Number of children: 5   Years of education: Not on file   Highest education level: Associate degree: academic program  Occupational History   Occupation: Retired  Tobacco Use   Smoking status: Every Day    Current packs/day: 1.00    Average packs/day: 1 pack/day for 60.0 years (60.0 ttl pk-yrs)    Types: Cigarettes   Smokeless tobacco: Never  Vaping Use   Vaping status: Former   Substances: Nicotine  Substance and Sexual Activity   Alcohol use: Not Currently   Drug use: Not Currently   Sexual activity: Not Currently  Other Topics Concern   Not on file  Social History Narrative   Lives w/ 3 of her children   Social Drivers of Health   Tobacco Use: High Risk (04/05/2024)   Patient History    Smoking Tobacco Use: Every Day    Smokeless Tobacco Use: Never    Passive Exposure: Not on file  Financial Resource Strain: High Risk (05/07/2023)   Received from [facility]   Overall Financial Resource Strain (CARDIA)    Difficulty of Paying Living Expenses: Hard  Food Insecurity: No Food Insecurity (03/10/2024)   Epic    Worried About Radiation Protection Practitioner of Food in the Last Year: Never true    Ran Out of Food in the Last Year: Never true  Transportation Needs: No Transportation Needs (03/10/2024)   Epic    Lack of Transportation (Medical): No    Lack of Transportation (Non-Medical): No  Physical Activity: Not on file  Stress: Not on file  Social Connections: Socially Isolated (03/10/2024)   Social Connection and Isolation Panel    Frequency of Communication with Friends and Family: Three times a week    Frequency of Social Gatherings with Friends  and Family: Three times a week    Attends Religious Services: Never    Active Member of Clubs or Organizations: No    Attends Banker Meetings: Never    Marital Status: Divorced  Catering Manager Violence: Not At Risk (03/10/2024)   Epic    Fear of Current or Ex-Partner: No    Emotionally Abused: No    Physically Abused: No    Sexually Abused: No  Depression (PHQ2-9): Low Risk (04/05/2024)   Depression (PHQ2-9)    PHQ-2 Score: 0  Alcohol Screen: Low Risk (03/24/2023)   Alcohol Screen    Last Alcohol Screening Score (AUDIT): 1  Housing: Low Risk (03/10/2024)   Epic    Unable to Pay for Housing in the Last Year: No    Number of Times Moved in the Last Year: 0    Homeless in the Last Year: No  Utilities: Not At Risk (03/10/2024)   Epic    Threatened with loss of utilities: No  Health Literacy: Not on file    FH:  Family History  Problem Relation Age of Onset  Uterine cancer Mother    Stroke Mother    Aneurysm Mother    COPD Mother    Heart attack Father    Coronary artery disease Father    Leukemia Father    Depression Father    Diabetes Father    Hyperlipidemia Father    Hypertension Father    Alcohol abuse Father    Cancer Father    Heart disease Father    Colon polyps Sister    Obesity Sister    Thyroid  disease Sister    COPD Sister    Lung cancer Sister    Breast cancer Maternal Aunt    Breast cancer Maternal Aunt    Kidney disease Paternal Uncle     Past Medical History:  Diagnosis Date   Allergy    Anemia 11/23   Anxiety    Blood transfusion without reported diagnosis    CHF (congestive heart failure) (HCC)    Chronic heart failure with preserved ejection fraction (HFpEF) (HCC)    a. 02/2023 Echo: EF 60-65%, no rwma, GrII DD, nl RV size/fxn, mild LAE, mild Misty (mean grad ), mod MAC, mild AS (mean grad 9.58mmHg).   CKD (chronic kidney disease), stage III (HCC)    COPD (chronic obstructive pulmonary disease) (HCC)    Coronary artery  disease    Depression    Diabetes mellitus without complication (HCC)    Emphysema of lung (HCC) 2010   Hyperlipidemia    Hypertension    Hypothyroidism    Interstitial lung disease (HCC)    PAD (peripheral artery disease)    a. 07/2017 L subclavian stenosis s/p stenting.   Psoriasis    Squamous cell carcinoma of skin 08/25/2023   SCC IS,  [facility], referral sent for Mohs Dr. Corey   Substance abuse [facility])    Tobacco abuse     Current Outpatient Medications  Medication Sig Dispense Refill   ACCU-CHEK GUIDE TEST test strip      Accu-Chek Softclix Lancets lancets SMARTSIG:Lancet Topical     albuterol  (VENTOLIN  HFA) 108 (90 Base) MCG/ACT inhaler Inhale 2 puffs into the lungs every 6 (six) hours as needed for wheezing or shortness of breath. 1 each 3   ALPRAZolam  (XANAX ) 0.5 MG tablet Take 0.5 mg by mouth 2 (two) times daily as needed for anxiety.      aspirin  81 MG tablet Take 81 mg by mouth at bedtime.      atorvastatin  (LIPITOR) 40 MG tablet Take 1 tablet (40 mg total) by mouth at bedtime. 30 tablet 0   bisacodyl  (DULCOLAX) 5 MG EC tablet Take 5 mg by mouth daily as needed for moderate constipation.     bisoprolol  (ZEBETA ) 5 MG tablet TAKE 1 TABLET (5 MG TOTAL) BY MOUTH DAILY. 90 tablet 1   budeson-glycopyrrolate -formoterol  (BREZTRI  AEROSPHERE) 160-9-4.8 MCG/ACT AERO inhaler Inhale 2 puffs into the lungs 2 (two) times daily.     Cholecalciferol  (VITAMIN D3) 75 MCG (3000 UT) TABS Take 3,000 Units by mouth once a week. Tuesday     Cyanocobalamin  (VITAMIN B-12) 500 MCG SUBL Place under the tongue daily.     ezetimibe  (ZETIA ) 10 MG tablet TAKE 1 TABLET BY MOUTH EVERY DAY 90 tablet 1   gabapentin  (NEURONTIN ) 100 MG capsule Take 200 mg by mouth at bedtime.     JARDIANCE  10 MG TABS tablet TAKE ONE TABLET BY MOUTH DAILY 90 tablet 2   levothyroxine  (SYNTHROID , LEVOTHROID) 125 MCG tablet Take 125 mcg by mouth daily.  metolazone  (ZAROXOLYN ) 2.5 MG tablet Take 1 tab weekly and an additional  dose as directed by HF CLINIC 7 tablet 3   mupirocin  ointment (BACTROBAN ) 2 % Apply 1 Application topically 2 (two) times daily. 22 g 0   OXYGEN  Inhale 2 L/L into the lungs at bedtime.     pantoprazole  (PROTONIX ) 40 MG tablet Take 40 mg by mouth 2 (two) times daily.     potassium chloride  (KLOR-CON ) 10 MEQ tablet Take 30 mEq by mouth in the morning and at bedtime.     potassium chloride  SA (KLOR-CON  M20) 20 MEQ tablet Take 2 tablets (40 mEq total) by mouth daily. And additional 20meq with metolazone      spironolactone  (ALDACTONE ) 25 MG tablet Take 1 tablet (25 mg total) by mouth daily. 30 tablet 0   temazepam  (RESTORIL ) 15 MG capsule Take 15 mg by mouth at bedtime as needed.     torsemide  (DEMADEX ) 20 MG tablet Take 3 tablets (60 mg total) by mouth daily. 270 tablet 3   traZODone  (DESYREL ) 100 MG tablet Take 100 mg by mouth daily as needed for sleep. (Patient taking differently: Take 100 mg by mouth at bedtime.)     venlafaxine  XR (EFFEXOR -XR) 75 MG 24 hr capsule Take 75 mg by mouth daily with breakfast.     No current facility-administered medications for this visit.   Vitals:   04/19/24 0856  BP: (!) 158/53  Pulse: 63  SpO2: 95%  Weight: 179 lb 12.8 oz (81.6 kg)   Wt Readings from Last 3 Encounters:  04/19/24 179 lb 12.8 oz (81.6 kg)  04/05/24 177 lb (80.3 kg)  03/28/24 178 lb (80.7 kg)   Lab Results  Component Value Date   CREATININE 2.35 (H) 04/05/2024   CREATININE 2.40 (H) 03/11/2024   CREATININE 2.50 (H) 03/10/2024    PHYSICAL EXAM:  General: Well appearing.  Cor: No JVD. Regular rhythm, rate.  Lungs:rales RLL, wearing oxygen  Abdomen: taught, nontender, distended. Extremities: 2+ pitting edema bilateral lower legs Neuro:. Affect pleasant   ECG: not done  ReDs reading: 38 %, abnormal   ASSESSMENT & PLAN:  1: Ischemic heart failure with preserved ejection fraction- - cath with stent 03/26/23 - NYHA class III - fluid up with weight gain, elevated ReDs of 38% and  worsening symptoms - will give furoscix  today and then again in 2 days. No torsemide  on furoscix  days. She can take weekly metolazone  tomorrow instead of today - BMET, proBNP today - weight up 2 pounds from last visit here 5 weeks ago - Echo 03/09/23: EF 60-65% with Grade II DD, mild LAE, mild AS - continue bisoprolol  5mg  daily - continue jardiance  10mg  daily  - continue metolazone  2.5mg  weekly on Tuesdays - continue potassium 30meq BID - continue spironolactone  25mg  daily - continue torsemide  60mg  daily - not adding salt and has been looking at food labels - proBNP 03/10/24 was 1226.0  2: HTN- - BP 158/53. Fluid up so using furoscix  per above - saw PCP Ted) 11/25 - saw nephrology Jil) 07/25. Returns 01/26 - BMP 04/05/24 reviewed: sodium 141, potassium 4.6, creatinine 2.35, GFR 22 - BMET today  3: CAD- - saw cardiology ([place]) 08/25 - no longer on plavix  - continue ASA 81mg  daily - RHC/ LHC 03/26/23:   Prox RCA lesion is 45% stenosed.   CULPRIT LESION: Mid RCA lesion is 90% stenosed.   A drug-eluting stent was successfully placed using a STENT ONYX FRONTIER 3.0X22.   Post intervention, there is a 0% residual  stenosis.   Hemodynamic findings consistent with mild pulmonary hypertension.  (PAP 49/24-33 mmHg, PCWP 24 mmHg transpulmonary Gradient 9 mmHg); LVEDP 16 mmHg.  4: COPD (managed by pulmonology)- - continue albuterol  inhaler PRN; she estimates that she uses this ~ once / month - continue breztri   - saw pulmonology Alica) 04/25 - wearing oxygen  at 2L around the clock  5: Anemia (managed by hematology)- - saw hematology Oza) 12/25 - iron  infusion done 04/18/24 - Hg 04/05/24 was 10.2. Was down to 6.4 in 11/25 resulting in admission - saw GI 12/25. Colonoscopy / small intestine endoscopy done 03/28/24  6: Hyperlipidemia- - continue atorvastatin  40mg  daily - continue ezetimibe  10mg  daily - LDL 01/19/24 was 40 - lipo (a) 03/27/23 was 54.2  7: Tobacco  use- - smoking 1.5 ppd of cigarettes - voices confirmation that she removes her oxygen  when smoking and removes herself away from the oxygen  - cessation encouraged   Return in 2 weeks as she sees nephrology next week, sooner if needed.   I spent 30 minutes reviewing records, interviewing/ examing patient and managing plan/ orders.   Ellouise Class, FNP-C 04/18/2024  "

## 2024-04-18 NOTE — Telephone Encounter (Signed)
 Called to confirm/remind patient of their appointment at the Advanced Heart Failure Clinic on 04/19/24.   Appointment:   [x] Confirmed  [] Left mess   [] No answer/No voice mail  [] VM Full/unable to leave message  [] Phone not in service  Patient reminded to bring all medications and/or complete list.  Confirmed patient has transportation. Gave directions, instructed to utilize valet parking.

## 2024-04-19 ENCOUNTER — Telehealth (HOSPITAL_COMMUNITY): Payer: Self-pay

## 2024-04-19 ENCOUNTER — Encounter: Payer: Self-pay | Admitting: Family

## 2024-04-19 ENCOUNTER — Ambulatory Visit: Admitting: Family

## 2024-04-19 ENCOUNTER — Other Ambulatory Visit (HOSPITAL_COMMUNITY): Payer: Self-pay

## 2024-04-19 ENCOUNTER — Encounter: Payer: Self-pay | Admitting: Internal Medicine

## 2024-04-19 VITALS — BP 158/53 | HR 63 | Wt 179.8 lb

## 2024-04-19 DIAGNOSIS — I272 Pulmonary hypertension, unspecified: Secondary | ICD-10-CM | POA: Diagnosis not present

## 2024-04-19 DIAGNOSIS — Z955 Presence of coronary angioplasty implant and graft: Secondary | ICD-10-CM | POA: Insufficient documentation

## 2024-04-19 DIAGNOSIS — I130 Hypertensive heart and chronic kidney disease with heart failure and stage 1 through stage 4 chronic kidney disease, or unspecified chronic kidney disease: Secondary | ICD-10-CM | POA: Diagnosis present

## 2024-04-19 DIAGNOSIS — Z720 Tobacco use: Secondary | ICD-10-CM

## 2024-04-19 DIAGNOSIS — D649 Anemia, unspecified: Secondary | ICD-10-CM | POA: Diagnosis not present

## 2024-04-19 DIAGNOSIS — E785 Hyperlipidemia, unspecified: Secondary | ICD-10-CM | POA: Insufficient documentation

## 2024-04-19 DIAGNOSIS — Z79890 Hormone replacement therapy (postmenopausal): Secondary | ICD-10-CM | POA: Insufficient documentation

## 2024-04-19 DIAGNOSIS — Z833 Family history of diabetes mellitus: Secondary | ICD-10-CM | POA: Insufficient documentation

## 2024-04-19 DIAGNOSIS — I2510 Atherosclerotic heart disease of native coronary artery without angina pectoris: Secondary | ICD-10-CM | POA: Insufficient documentation

## 2024-04-19 DIAGNOSIS — Z79899 Other long term (current) drug therapy: Secondary | ICD-10-CM | POA: Diagnosis not present

## 2024-04-19 DIAGNOSIS — I5032 Chronic diastolic (congestive) heart failure: Secondary | ICD-10-CM | POA: Diagnosis not present

## 2024-04-19 DIAGNOSIS — Z7982 Long term (current) use of aspirin: Secondary | ICD-10-CM | POA: Insufficient documentation

## 2024-04-19 DIAGNOSIS — J449 Chronic obstructive pulmonary disease, unspecified: Secondary | ICD-10-CM | POA: Insufficient documentation

## 2024-04-19 DIAGNOSIS — I10 Essential (primary) hypertension: Secondary | ICD-10-CM | POA: Diagnosis not present

## 2024-04-19 DIAGNOSIS — F17210 Nicotine dependence, cigarettes, uncomplicated: Secondary | ICD-10-CM | POA: Insufficient documentation

## 2024-04-19 DIAGNOSIS — N183 Chronic kidney disease, stage 3 unspecified: Secondary | ICD-10-CM | POA: Insufficient documentation

## 2024-04-19 DIAGNOSIS — E1122 Type 2 diabetes mellitus with diabetic chronic kidney disease: Secondary | ICD-10-CM | POA: Diagnosis not present

## 2024-04-19 DIAGNOSIS — Z8249 Family history of ischemic heart disease and other diseases of the circulatory system: Secondary | ICD-10-CM | POA: Diagnosis not present

## 2024-04-19 LAB — BASIC METABOLIC PANEL WITH GFR
BUN/Creatinine Ratio: 19 (ref 12–28)
BUN: 40 mg/dL — ABNORMAL HIGH (ref 8–27)
CO2: 24 mmol/L (ref 20–29)
Calcium: 9.3 mg/dL (ref 8.7–10.3)
Chloride: 102 mmol/L (ref 96–106)
Creatinine, Ser: 2.14 mg/dL — ABNORMAL HIGH (ref 0.57–1.00)
Glucose: 77 mg/dL (ref 70–99)
Potassium: 4.5 mmol/L (ref 3.5–5.2)
Sodium: 141 mmol/L (ref 134–144)
eGFR: 24 mL/min/1.73 — ABNORMAL LOW

## 2024-04-19 LAB — PRO B NATRIURETIC PEPTIDE: NT-Pro BNP: 1764 pg/mL — ABNORMAL HIGH (ref 0–301)

## 2024-04-19 MED ORDER — FUROSCIX 80 MG/10ML ~~LOC~~ CTKT
CARTRIDGE | SUBCUTANEOUS | 1 refills | Status: DC
  Filled 2024-04-20: qty 5, fill #0

## 2024-04-19 MED ORDER — FUROSCIX 80 MG/10ML ~~LOC~~ CTKT: CARTRIDGE | SUBCUTANEOUS | Status: DC

## 2024-04-19 NOTE — Progress Notes (Signed)
"   ReDS Vest / Clip - 04/19/24 0856       ReDS Vest / Clip   Station Marker A    Ruler Value 28    ReDS Value Range Moderate volume overload    ReDS Actual Value 38          "

## 2024-04-19 NOTE — Telephone Encounter (Signed)
 Advanced Heart Failure Patient Advocate Encounter  Prior authorization for Furoscix  has been submitted and approved. Test billing returns $295.17 for 1 day supply.  Key: [id number] Effective: 2/30/2025 to 10/16/2024  Rachel DEL, CPhT Rx Patient Advocate Phone: [phone]

## 2024-04-19 NOTE — Patient Instructions (Signed)
 Medication Changes:  TODAY AND THURSDAY ONLY Take 1 furoscix  kit. DO NOT TAKE TORSEMIDE  ON TODAY AND THURSDAY.   Lab Work:   Go downstairs to [place] on [address] to have your blood work completed.  We will only call you if the results are abnormal or if the provider would like to make medication changes.  No news is good news.   Follow-Up in: Please follow up with the Advanced Heart Failure Clinic in 2 weeks with Ellouise Class, FNP.   Thank you for choosing [facility] Advanced Heart Failure Clinic.    At the Advanced Heart Failure Clinic, you and your health needs are our priority. We have a designated team specialized in the treatment of Heart Failure. This Care Team includes your primary Heart Failure Specialized Cardiologist (physician), Advanced Practice Providers (APPs- Physician Assistants and Nurse Practitioners), and Pharmacist who all work together to provide you with the care you need, when you need it.   You may see any of the following providers on your designated Care Team at your next follow up:  Dr. Toribio Fuel Dr. Ezra Shuck Dr. Ria Commander Dr. Morene Brownie Ellouise Class, FNP Jaun Bash, RPH-CPP  Please be sure to bring in all your medications bottles to every appointment.   Need to Contact Us :  If you have any questions or concerns before your next appointment please send us  a message through [place] or call our office at [phone].    TO LEAVE A MESSAGE FOR THE NURSE SELECT OPTION 2, PLEASE LEAVE A MESSAGE INCLUDING: YOUR NAME DATE OF BIRTH CALL BACK NUMBER REASON FOR CALL**this is important as we prioritize the call backs  YOU WILL RECEIVE A CALL BACK THE SAME DAY AS LONG AS YOU CALL BEFORE 4:00 PM

## 2024-04-20 ENCOUNTER — Other Ambulatory Visit: Payer: Self-pay

## 2024-04-20 ENCOUNTER — Ambulatory Visit: Payer: Self-pay | Admitting: Family

## 2024-04-22 ENCOUNTER — Encounter: Payer: Self-pay | Admitting: Internal Medicine

## 2024-04-25 ENCOUNTER — Other Ambulatory Visit: Payer: Self-pay

## 2024-04-25 ENCOUNTER — Inpatient Hospital Stay
Admission: EM | Admit: 2024-04-25 | Discharge: 2024-04-29 | DRG: 291 | Disposition: A | Discharge status: Home / Self Care | Attending: Internal Medicine | Admitting: Internal Medicine

## 2024-04-25 ENCOUNTER — Emergency Department

## 2024-04-25 DIAGNOSIS — I708 Atherosclerosis of other arteries: Secondary | ICD-10-CM | POA: Diagnosis present

## 2024-04-25 DIAGNOSIS — Z7982 Long term (current) use of aspirin: Secondary | ICD-10-CM

## 2024-04-25 DIAGNOSIS — I5033 Acute on chronic diastolic (congestive) heart failure: Secondary | ICD-10-CM | POA: Diagnosis present

## 2024-04-25 DIAGNOSIS — Z801 Family history of malignant neoplasm of trachea, bronchus and lung: Secondary | ICD-10-CM

## 2024-04-25 DIAGNOSIS — Z83438 Family history of other disorder of lipoprotein metabolism and other lipidemia: Secondary | ICD-10-CM

## 2024-04-25 DIAGNOSIS — Z806 Family history of leukemia: Secondary | ICD-10-CM

## 2024-04-25 DIAGNOSIS — I10 Essential (primary) hypertension: Secondary | ICD-10-CM | POA: Diagnosis not present

## 2024-04-25 DIAGNOSIS — J9621 Acute and chronic respiratory failure with hypoxia: Secondary | ICD-10-CM | POA: Diagnosis present

## 2024-04-25 DIAGNOSIS — R0602 Shortness of breath: Secondary | ICD-10-CM

## 2024-04-25 DIAGNOSIS — J441 Chronic obstructive pulmonary disease with (acute) exacerbation: Secondary | ICD-10-CM | POA: Diagnosis present

## 2024-04-25 DIAGNOSIS — I5032 Chronic diastolic (congestive) heart failure: Secondary | ICD-10-CM | POA: Diagnosis present

## 2024-04-25 DIAGNOSIS — J849 Interstitial pulmonary disease, unspecified: Secondary | ICD-10-CM | POA: Diagnosis present

## 2024-04-25 DIAGNOSIS — Z85828 Personal history of other malignant neoplasm of skin: Secondary | ICD-10-CM

## 2024-04-25 DIAGNOSIS — E785 Hyperlipidemia, unspecified: Secondary | ICD-10-CM | POA: Diagnosis present

## 2024-04-25 DIAGNOSIS — F411 Generalized anxiety disorder: Secondary | ICD-10-CM | POA: Diagnosis present

## 2024-04-25 DIAGNOSIS — J439 Emphysema, unspecified: Secondary | ICD-10-CM | POA: Diagnosis present

## 2024-04-25 DIAGNOSIS — Z803 Family history of malignant neoplasm of breast: Secondary | ICD-10-CM

## 2024-04-25 DIAGNOSIS — F419 Anxiety disorder, unspecified: Secondary | ICD-10-CM | POA: Diagnosis present

## 2024-04-25 DIAGNOSIS — I272 Pulmonary hypertension, unspecified: Secondary | ICD-10-CM | POA: Diagnosis present

## 2024-04-25 DIAGNOSIS — F32A Depression, unspecified: Secondary | ICD-10-CM | POA: Diagnosis not present

## 2024-04-25 DIAGNOSIS — Z818 Family history of other mental and behavioral disorders: Secondary | ICD-10-CM

## 2024-04-25 DIAGNOSIS — Z90710 Acquired absence of both cervix and uterus: Secondary | ICD-10-CM

## 2024-04-25 DIAGNOSIS — E66811 Obesity, class 1: Secondary | ICD-10-CM | POA: Diagnosis present

## 2024-04-25 DIAGNOSIS — E1165 Type 2 diabetes mellitus with hyperglycemia: Secondary | ICD-10-CM | POA: Diagnosis not present

## 2024-04-25 DIAGNOSIS — F17200 Nicotine dependence, unspecified, uncomplicated: Secondary | ICD-10-CM | POA: Diagnosis present

## 2024-04-25 DIAGNOSIS — I2510 Atherosclerotic heart disease of native coronary artery without angina pectoris: Secondary | ICD-10-CM | POA: Diagnosis present

## 2024-04-25 DIAGNOSIS — Z823 Family history of stroke: Secondary | ICD-10-CM

## 2024-04-25 DIAGNOSIS — J432 Centrilobular emphysema: Secondary | ICD-10-CM | POA: Diagnosis not present

## 2024-04-25 DIAGNOSIS — K219 Gastro-esophageal reflux disease without esophagitis: Secondary | ICD-10-CM | POA: Diagnosis present

## 2024-04-25 DIAGNOSIS — F17210 Nicotine dependence, cigarettes, uncomplicated: Secondary | ICD-10-CM | POA: Diagnosis present

## 2024-04-25 DIAGNOSIS — E1122 Type 2 diabetes mellitus with diabetic chronic kidney disease: Secondary | ICD-10-CM | POA: Diagnosis present

## 2024-04-25 DIAGNOSIS — Z6830 Body mass index (BMI) 30.0-30.9, adult: Secondary | ICD-10-CM

## 2024-04-25 DIAGNOSIS — Z833 Family history of diabetes mellitus: Secondary | ICD-10-CM

## 2024-04-25 DIAGNOSIS — Z8349 Family history of other endocrine, nutritional and metabolic diseases: Secondary | ICD-10-CM

## 2024-04-25 DIAGNOSIS — D631 Anemia in chronic kidney disease: Secondary | ICD-10-CM | POA: Diagnosis present

## 2024-04-25 DIAGNOSIS — I130 Hypertensive heart and chronic kidney disease with heart failure and stage 1 through stage 4 chronic kidney disease, or unspecified chronic kidney disease: Principal | ICD-10-CM | POA: Diagnosis present

## 2024-04-25 DIAGNOSIS — R0902 Hypoxemia: Secondary | ICD-10-CM

## 2024-04-25 DIAGNOSIS — J449 Chronic obstructive pulmonary disease, unspecified: Secondary | ICD-10-CM | POA: Diagnosis present

## 2024-04-25 DIAGNOSIS — R519 Headache, unspecified: Secondary | ICD-10-CM | POA: Diagnosis present

## 2024-04-25 DIAGNOSIS — Z1152 Encounter for screening for COVID-19: Secondary | ICD-10-CM

## 2024-04-25 DIAGNOSIS — Z955 Presence of coronary angioplasty implant and graft: Secondary | ICD-10-CM

## 2024-04-25 DIAGNOSIS — N184 Chronic kidney disease, stage 4 (severe): Secondary | ICD-10-CM | POA: Diagnosis present

## 2024-04-25 DIAGNOSIS — Z79899 Other long term (current) drug therapy: Secondary | ICD-10-CM

## 2024-04-25 DIAGNOSIS — N179 Acute kidney failure, unspecified: Secondary | ICD-10-CM | POA: Diagnosis not present

## 2024-04-25 DIAGNOSIS — L409 Psoriasis, unspecified: Secondary | ICD-10-CM | POA: Diagnosis present

## 2024-04-25 DIAGNOSIS — T380X5A Adverse effect of glucocorticoids and synthetic analogues, initial encounter: Secondary | ICD-10-CM | POA: Diagnosis not present

## 2024-04-25 DIAGNOSIS — E039 Hypothyroidism, unspecified: Secondary | ICD-10-CM | POA: Diagnosis present

## 2024-04-25 DIAGNOSIS — Z66 Do not resuscitate: Secondary | ICD-10-CM | POA: Diagnosis present

## 2024-04-25 DIAGNOSIS — Z8049 Family history of malignant neoplasm of other genital organs: Secondary | ICD-10-CM

## 2024-04-25 DIAGNOSIS — Z79890 Hormone replacement therapy (postmenopausal): Secondary | ICD-10-CM

## 2024-04-25 DIAGNOSIS — Z95820 Peripheral vascular angioplasty status with implants and grafts: Secondary | ICD-10-CM

## 2024-04-25 DIAGNOSIS — Z8249 Family history of ischemic heart disease and other diseases of the circulatory system: Secondary | ICD-10-CM

## 2024-04-25 DIAGNOSIS — Z7984 Long term (current) use of oral hypoglycemic drugs: Secondary | ICD-10-CM

## 2024-04-25 DIAGNOSIS — E1151 Type 2 diabetes mellitus with diabetic peripheral angiopathy without gangrene: Secondary | ICD-10-CM | POA: Diagnosis present

## 2024-04-25 DIAGNOSIS — Z716 Tobacco abuse counseling: Secondary | ICD-10-CM

## 2024-04-25 DIAGNOSIS — Z825 Family history of asthma and other chronic lower respiratory diseases: Secondary | ICD-10-CM

## 2024-04-25 DIAGNOSIS — Z9981 Dependence on supplemental oxygen: Secondary | ICD-10-CM

## 2024-04-25 LAB — MAGNESIUM: Magnesium: 2.3 mg/dL (ref 1.7–2.4)

## 2024-04-25 LAB — CBC
HCT: 31.2 % — ABNORMAL LOW (ref 36.0–46.0)
Hemoglobin: 8.8 g/dL — ABNORMAL LOW (ref 12.0–15.0)
MCH: 25.2 pg — ABNORMAL LOW (ref 26.0–34.0)
MCHC: 28.2 g/dL — ABNORMAL LOW (ref 30.0–36.0)
MCV: 89.4 fL (ref 80.0–100.0)
Platelets: 261 K/uL (ref 150–400)
RBC: 3.49 MIL/uL — ABNORMAL LOW (ref 3.87–5.11)
RDW: 23 % — ABNORMAL HIGH (ref 11.5–15.5)
WBC: 9.3 K/uL (ref 4.0–10.5)
nRBC: 0.2 % (ref 0.0–0.2)

## 2024-04-25 LAB — BASIC METABOLIC PANEL WITH GFR
Anion gap: 11 (ref 5–15)
BUN: 55 mg/dL — ABNORMAL HIGH (ref 8–23)
CO2: 27 mmol/L (ref 22–32)
Calcium: 9.6 mg/dL (ref 8.9–10.3)
Chloride: 103 mmol/L (ref 98–111)
Creatinine, Ser: 2.51 mg/dL — ABNORMAL HIGH (ref 0.44–1.00)
GFR, Estimated: 20 mL/min — ABNORMAL LOW
Glucose, Bld: 122 mg/dL — ABNORMAL HIGH (ref 70–99)
Potassium: 4.6 mmol/L (ref 3.5–5.1)
Sodium: 141 mmol/L (ref 135–145)

## 2024-04-25 LAB — TROPONIN T, HIGH SENSITIVITY
Troponin T High Sensitivity: 47 ng/L — ABNORMAL HIGH (ref 0–19)
Troponin T High Sensitivity: 50 ng/L — ABNORMAL HIGH (ref 0–19)

## 2024-04-25 LAB — RESP PANEL BY RT-PCR (RSV, FLU A&B, COVID)  RVPGX2
Influenza A by PCR: NEGATIVE
Influenza B by PCR: NEGATIVE
Resp Syncytial Virus by PCR: NEGATIVE
SARS Coronavirus 2 by RT PCR: NEGATIVE

## 2024-04-25 LAB — PRO BRAIN NATRIURETIC PEPTIDE: Pro Brain Natriuretic Peptide: 3335 pg/mL — ABNORMAL HIGH

## 2024-04-25 MED ORDER — ASPIRIN 81 MG PO TBEC
81.0000 mg | DELAYED_RELEASE_TABLET | Freq: Every day | ORAL | Status: DC
  Administered 2024-04-25 – 2024-04-28 (×4): 81 mg via ORAL
  Filled 2024-04-25 (×4): qty 1

## 2024-04-25 MED ORDER — SODIUM CHLORIDE 0.9% FLUSH
3.0000 mL | Freq: Two times a day (BID) | INTRAVENOUS | Status: DC
  Administered 2024-04-25 – 2024-04-28 (×7): 3 mL via INTRAVENOUS

## 2024-04-25 MED ORDER — PREDNISONE 20 MG PO TABS
40.0000 mg | ORAL_TABLET | Freq: Every day | ORAL | Status: AC
  Administered 2024-04-26 – 2024-04-29 (×4): 40 mg via ORAL
  Filled 2024-04-25 (×4): qty 2

## 2024-04-25 MED ORDER — METHYLPREDNISOLONE SODIUM SUCC 125 MG IJ SOLR
125.0000 mg | Freq: Once | INTRAMUSCULAR | Status: AC
  Administered 2024-04-25: 125 mg via INTRAVENOUS
  Filled 2024-04-25: qty 2

## 2024-04-25 MED ORDER — SENNOSIDES-DOCUSATE SODIUM 8.6-50 MG PO TABS: 1.0000 | ORAL_TABLET | Freq: Every evening | ORAL | Status: DC | PRN

## 2024-04-25 MED ORDER — FUROSEMIDE 10 MG/ML IJ SOLN
60.0000 mg | Freq: Two times a day (BID) | INTRAMUSCULAR | Status: DC
  Administered 2024-04-26: 60 mg via INTRAVENOUS
  Filled 2024-04-25: qty 8

## 2024-04-25 MED ORDER — VENLAFAXINE HCL ER 75 MG PO CP24
75.0000 mg | ORAL_CAPSULE | Freq: Every day | ORAL | Status: DC
  Administered 2024-04-26 – 2024-04-29 (×4): 75 mg via ORAL
  Filled 2024-04-25 (×4): qty 1

## 2024-04-25 MED ORDER — TRAZODONE HCL 100 MG PO TABS
100.0000 mg | ORAL_TABLET | Freq: Every day | ORAL | Status: DC
  Administered 2024-04-25 – 2024-04-28 (×4): 100 mg via ORAL
  Filled 2024-04-25 (×4): qty 1

## 2024-04-25 MED ORDER — ONDANSETRON HCL 4 MG/2ML IJ SOLN: 4.0000 mg | Freq: Four times a day (QID) | INTRAMUSCULAR | Status: DC | PRN

## 2024-04-25 MED ORDER — FUROSEMIDE 10 MG/ML IJ SOLN
80.0000 mg | Freq: Once | INTRAMUSCULAR | Status: AC
  Administered 2024-04-25: 80 mg via INTRAVENOUS
  Filled 2024-04-25: qty 8

## 2024-04-25 MED ORDER — ACETAMINOPHEN 325 MG PO TABS
650.0000 mg | ORAL_TABLET | Freq: Four times a day (QID) | ORAL | Status: DC | PRN
  Administered 2024-04-25 – 2024-04-27 (×7): 650 mg via ORAL
  Filled 2024-04-25 (×7): qty 2

## 2024-04-25 MED ORDER — ACETAMINOPHEN 650 MG RE SUPP: 650.0000 mg | Freq: Four times a day (QID) | RECTAL | Status: DC | PRN

## 2024-04-25 MED ORDER — ATORVASTATIN CALCIUM 20 MG PO TABS
40.0000 mg | ORAL_TABLET | Freq: Every day | ORAL | Status: DC
  Administered 2024-04-25 – 2024-04-28 (×4): 40 mg via ORAL
  Filled 2024-04-25 (×4): qty 2

## 2024-04-25 MED ORDER — BISOPROLOL FUMARATE 5 MG PO TABS
5.0000 mg | ORAL_TABLET | Freq: Every day | ORAL | Status: DC
  Administered 2024-04-27 – 2024-04-29 (×3): 5 mg via ORAL
  Filled 2024-04-25 (×4): qty 1

## 2024-04-25 MED ORDER — ACETAMINOPHEN 500 MG PO TABS
1000.0000 mg | ORAL_TABLET | Freq: Once | ORAL | Status: AC
  Administered 2024-04-25: 1000 mg via ORAL
  Filled 2024-04-25: qty 2

## 2024-04-25 MED ORDER — VITAMIN D 25 MCG (1000 UNIT) PO TABS
3000.0000 [IU] | ORAL_TABLET | ORAL | Status: DC
  Administered 2024-04-26: 3000 [IU] via ORAL
  Filled 2024-04-25: qty 3

## 2024-04-25 MED ORDER — ONDANSETRON HCL 4 MG PO TABS
4.0000 mg | ORAL_TABLET | Freq: Four times a day (QID) | ORAL | Status: DC | PRN
  Administered 2024-04-25: 4 mg via ORAL
  Filled 2024-04-25: qty 1

## 2024-04-25 MED ORDER — BUDESONIDE 0.5 MG/2ML IN SUSP
0.5000 mg | Freq: Two times a day (BID) | RESPIRATORY_TRACT | Status: DC
  Administered 2024-04-25 – 2024-04-29 (×8): 0.5 mg via RESPIRATORY_TRACT
  Filled 2024-04-25 (×8): qty 2

## 2024-04-25 MED ORDER — EZETIMIBE 10 MG PO TABS
10.0000 mg | ORAL_TABLET | Freq: Every day | ORAL | Status: DC
  Administered 2024-04-26 – 2024-04-29 (×4): 10 mg via ORAL
  Filled 2024-04-25 (×4): qty 1

## 2024-04-25 MED ORDER — HEPARIN SODIUM (PORCINE) 5000 UNIT/ML IJ SOLN
5000.0000 [IU] | Freq: Three times a day (TID) | INTRAMUSCULAR | Status: DC
  Administered 2024-04-25 – 2024-04-29 (×11): 5000 [IU] via SUBCUTANEOUS
  Filled 2024-04-25 (×11): qty 1

## 2024-04-25 MED ORDER — EMPAGLIFLOZIN 10 MG PO TABS
10.0000 mg | ORAL_TABLET | Freq: Every day | ORAL | Status: DC
  Administered 2024-04-26 – 2024-04-29 (×4): 10 mg via ORAL
  Filled 2024-04-25 (×4): qty 1

## 2024-04-25 MED ORDER — IPRATROPIUM-ALBUTEROL 0.5-2.5 (3) MG/3ML IN SOLN
6.0000 mL | Freq: Once | RESPIRATORY_TRACT | Status: AC
  Administered 2024-04-25: 6 mL via RESPIRATORY_TRACT
  Filled 2024-04-25: qty 6

## 2024-04-25 MED ORDER — PANTOPRAZOLE SODIUM 40 MG PO TBEC
40.0000 mg | DELAYED_RELEASE_TABLET | Freq: Two times a day (BID) | ORAL | Status: DC
  Administered 2024-04-25 – 2024-04-29 (×8): 40 mg via ORAL
  Filled 2024-04-25 (×8): qty 1

## 2024-04-25 MED ORDER — SODIUM CHLORIDE 0.9 % IV SOLN
500.0000 mg | INTRAVENOUS | Status: DC
  Administered 2024-04-25: 500 mg via INTRAVENOUS
  Filled 2024-04-25: qty 5

## 2024-04-25 MED ORDER — LEVOTHYROXINE SODIUM 50 MCG PO TABS
125.0000 ug | ORAL_TABLET | Freq: Every day | ORAL | Status: DC
  Administered 2024-04-26 – 2024-04-29 (×4): 125 ug via ORAL
  Filled 2024-04-25 (×4): qty 1

## 2024-04-25 MED ORDER — IPRATROPIUM-ALBUTEROL 0.5-2.5 (3) MG/3ML IN SOLN
3.0000 mL | Freq: Four times a day (QID) | RESPIRATORY_TRACT | Status: DC
  Administered 2024-04-25 – 2024-04-27 (×6): 3 mL via RESPIRATORY_TRACT
  Filled 2024-04-25 (×7): qty 3

## 2024-04-25 NOTE — H&P (Signed)
 " History and Physical    Misty Adams FMW:[id number] DOB: [birth year] DOA: 04/25/2024  DOS: the patient was seen and examined on 04/25/2024  PCP: Cleotilde Oneil FALCON, MD   Patient coming from: Home  I have personally briefly reviewed patient's old medical records in [facility] and CareEverywhere  HPI:   Misty Adams is a 69 y.o. year old female with medical history of hypertension, hyperlipidemia, type 2 diabetes, hypothyroidism, CHF (EF 60%, G2 DD and 02/2023), chronic hypoxic respiratory failure secondary to COPD, pulmonary hypertension, ILD on chronic 2 L presented to the ED with worsening shortness of breath.  She states this has been going on for the last 4-5 days.  She reports adherence to her medications.  She continues to smoke 1.5 packs/day.  States she has a mild sore throat but no other URI symptoms such as runny nose, myalgia.  She does report cough with worsening sputum production.  States she went to her cardiology office and was given Lasix  injections to alternate with her home torsemide .  She states it did not help.  Her breathing worsened today prompting her to come to the ED.  Arrival to the ED patient was noted to be HDS stable.  Lab work and imaging obtained.  CBC without leukocytosis, mild anemia near baseline.  BMP with creatinine slightly elevated but in the category of CKD 4.  Baseline appears to be around 2.3-2.4.  proBNP elevated at 3335.  Troponin elevated but flat.  Respiratory panel negative for COVID, flu, RSV.  Chest x-ray concerns for volume overload.  Given acute on chronic hypoxic respiratory failure, [facility] contacted for admission.  Review of Systems: As mentioned in the history of present illness. All other systems reviewed and are negative.   Past Medical History:  Diagnosis Date   Allergy    Anemia 11/23   Anxiety    Blood transfusion without reported diagnosis    CHF (congestive heart failure) (HCC)    Chronic heart failure with preserved ejection  fraction (HFpEF) (HCC)    a. 02/2023 Echo: EF 60-65%, no rwma, GrII DD, nl RV size/fxn, mild LAE, mild MS (mean grad ), mod MAC, mild AS (mean grad 9.43mmHg).   CKD (chronic kidney disease), stage III (HCC)    COPD (chronic obstructive pulmonary disease) (HCC)    Coronary artery disease    Depression    Diabetes mellitus without complication (HCC)    Emphysema of lung (HCC) 2010   Hyperlipidemia    Hypertension    Hypothyroidism    Interstitial lung disease (HCC)    PAD (peripheral artery disease)    a. 07/2017 L subclavian stenosis s/p stenting.   Psoriasis    Squamous cell carcinoma of skin 08/25/2023   SCC IS,  [facility], referral sent for Mohs Dr. Corey   Substance abuse [facility])    Tobacco abuse     Past Surgical History:  Procedure Laterality Date   ABDOMINAL HYSTERECTOMY     AORTIC ARCH ANGIOGRAPHY N/A 08/06/2017   Procedure: AORTIC ARCH ANGIOGRAPHY;  Surgeon: Laurence Redell CROME, MD;  Location: [facility] INVASIVE CV LAB;  Service: Cardiovascular;  Laterality: N/A;   APPENDECTOMY     BIOPSY  12/19/2022   Procedure: BIOPSY;  Surgeon: Onita Elspeth Sharper, DO;  Location: [facility] ENDOSCOPY;  Service: Gastroenterology;;   CESAREAN SECTION     COLONOSCOPY     COLONOSCOPY N/A 03/28/2024   Procedure: COLONOSCOPY;  Surgeon: Onita Elspeth Sharper, DO;  Location: [facility] ENDOSCOPY;  Service: Gastroenterology;  Laterality: N/A;  DM and patient on Plavix    COLONOSCOPY WITH PROPOFOL  N/A 12/07/2020   Procedure: COLONOSCOPY WITH PROPOFOL ;  Surgeon: Onita Elspeth Sharper, DO;  Location: [facility] ENDOSCOPY;  Service: Gastroenterology;  Laterality: N/A;   COLONOSCOPY WITH PROPOFOL  N/A 12/19/2022   Procedure: COLONOSCOPY WITH PROPOFOL ;  Surgeon: Onita Elspeth Sharper, DO;  Location: [facility] ENDOSCOPY;  Service: Gastroenterology;  Laterality: N/A;   CORONARY STENT INTERVENTION N/A 03/26/2023   Procedure: CORONARY STENT INTERVENTION;  Surgeon: Anner Alm ORN, MD;  Location: [facility] INVASIVE CV LAB;  Service:  Cardiovascular;  Laterality: N/A;   dilatation and curettage     DILATION AND CURETTAGE OF UTERUS     ENTEROSCOPY N/A 03/28/2024   Procedure: ENTEROSCOPY;  Surgeon: Onita Elspeth Sharper, DO;  Location: [facility] ENDOSCOPY;  Service: Gastroenterology;  Laterality: N/A;   ESOPHAGOGASTRODUODENOSCOPY N/A 12/07/2020   Procedure: ESOPHAGOGASTRODUODENOSCOPY (EGD);  Surgeon: Onita Elspeth Sharper, DO;  Location: [facility] ENDOSCOPY;  Service: Gastroenterology;  Laterality: N/A;   ESOPHAGOGASTRODUODENOSCOPY N/A 03/28/2024   Procedure: EGD (ESOPHAGOGASTRODUODENOSCOPY);  Surgeon: Onita Elspeth Sharper, DO;  Location: [facility] ENDOSCOPY;  Service: Gastroenterology;  Laterality: N/A;  EGD with Enteroscopy   ESOPHAGOGASTRODUODENOSCOPY (EGD) WITH PROPOFOL  N/A 12/19/2022   Procedure: ESOPHAGOGASTRODUODENOSCOPY (EGD) WITH PROPOFOL ;  Surgeon: Onita Elspeth Sharper, DO;  Location: [facility] ENDOSCOPY;  Service: Gastroenterology;  Laterality: N/A;   HOT HEMOSTASIS  03/28/2024   Procedure: EGD, WITH ARGON PLASMA COAGULATION;  Surgeon: Onita Elspeth Sharper, DO;  Location: [facility] ENDOSCOPY;  Service: Gastroenterology;;   KNEE SURGERY     left wrist ganglion cyst removal     OOPHORECTOMY  1994   PERIPHERAL VASCULAR INTERVENTION Left 08/06/2017   Procedure: PERIPHERAL VASCULAR INTERVENTION;  Surgeon: Laurence Redell CROME, MD;  Location: [facility] INVASIVE CV LAB;  Service: Cardiovascular;  Laterality: Left;  subclavian   POLYPECTOMY  12/19/2022   Procedure: POLYPECTOMY;  Surgeon: Onita Elspeth Sharper, DO;  Location: [facility] ENDOSCOPY;  Service: Gastroenterology;;   POLYPECTOMY  03/28/2024   Procedure: POLYPECTOMY, INTESTINE;  Surgeon: Onita Elspeth Sharper, DO;  Location: [facility] ENDOSCOPY;  Service: Gastroenterology;;   RIGHT/LEFT HEART CATH AND CORONARY ANGIOGRAPHY N/A 03/26/2023   Procedure: RIGHT/LEFT HEART CATH AND CORONARY ANGIOGRAPHY;  Surgeon: Anner Alm ORN, MD;  Location: [facility] INVASIVE CV LAB;  Service: Cardiovascular;  Laterality: N/A;   TUBAL  LIGATION       Allergies[1]  Family History  Problem Relation Age of Onset   Uterine cancer Mother    Stroke Mother    Aneurysm Mother    COPD Mother    Heart attack Father    Coronary artery disease Father    Leukemia Father    Depression Father    Diabetes Father    Hyperlipidemia Father    Hypertension Father    Alcohol abuse Father    Cancer Father    Heart disease Father    Colon polyps Sister    Obesity Sister    Thyroid  disease Sister    COPD Sister    Lung cancer Sister    Breast cancer Maternal Aunt    Breast cancer Maternal Aunt    Kidney disease Paternal Uncle     Prior to Admission medications  Medication Sig Start Date End Date Taking? Authorizing Provider  albuterol  (VENTOLIN  HFA) 108 (90 Base) MCG/ACT inhaler Inhale 2 puffs into the lungs every 6 (six) hours as needed for wheezing or shortness of breath. 03/28/23  Yes Patel, Sona, MD  ALPRAZolam  (XANAX ) 0.5 MG tablet Take 0.5 mg by mouth 2 (two) times daily as  needed for anxiety.  01/16/17  Yes [provider]  aspirin  81 MG tablet Take 81 mg by mouth at bedtime.    Yes [provider]  atorvastatin  (LIPITOR) 40 MG tablet Take 1 tablet (40 mg total) by mouth at bedtime. 07/14/17  Yes Judd Betters, MD  bisacodyl  (DULCOLAX) 5 MG EC tablet Take 5 mg by mouth daily as needed for moderate constipation.   Yes [provider]  bisoprolol  (ZEBETA ) 5 MG tablet TAKE 1 TABLET (5 MG TOTAL) BY MOUTH DAILY. 11/17/23  Yes Hackney, Ellouise A, FNP  budeson-glycopyrrolate -formoterol  (BREZTRI  AEROSPHERE) 160-9-4.8 MCG/ACT AERO inhaler Inhale 2 puffs into the lungs 2 (two) times daily.   Yes [provider]  Cholecalciferol  (VITAMIN D3) 75 MCG (3000 UT) TABS Take 3,000 Units by mouth once a week. Tuesday   Yes [provider]  Cyanocobalamin  (VITAMIN B-12) 500 MCG SUBL Place under the tongue daily.   Yes [provider]  ezetimibe  (ZETIA ) 10 MG tablet TAKE 1 TABLET BY MOUTH EVERY  DAY 11/17/23  Yes Donette Ellouise A, FNP  Furosemide  (FUROSCIX ) 80 MG/10ML CTKT Take 80mg  daily as directed. 04/19/24  Yes Donette Ellouise LABOR, FNP  Furosemide  (FUROSCIX ) 80 MG/10ML CTKT Take 80mg  daily as directed by the heart failure clinic 04/19/24  Yes Donette Ellouise A, FNP  gabapentin  (NEURONTIN ) 100 MG capsule Take 200 mg by mouth at bedtime.   Yes [provider]  JARDIANCE  10 MG TABS tablet TAKE ONE TABLET BY MOUTH DAILY 01/27/24  Yes Hackney, Tina A, FNP  levothyroxine  (SYNTHROID , LEVOTHROID) 125 MCG tablet Take 125 mcg by mouth daily.    Yes [provider]  metolazone  (ZAROXOLYN ) 2.5 MG tablet Take 1 tab weekly and an additional dose as directed by HF CLINIC 02/25/24  Yes Donette Ellouise A, FNP  mupirocin  ointment (BACTROBAN ) 2 % Apply 1 Application topically 2 (two) times daily. 03/30/24  Yes Moishe Chiquita HERO, NP  pantoprazole  (PROTONIX ) 40 MG tablet Take 40 mg by mouth 2 (two) times daily.   Yes [provider]  potassium chloride  (KLOR-CON ) 10 MEQ tablet Take 30 mEq by mouth in the morning and at bedtime.   Yes [provider]  spironolactone  (ALDACTONE ) 25 MG tablet Take 1 tablet (25 mg total) by mouth daily. 10/23/23  Yes Maree Hue, MD  temazepam  (RESTORIL ) 15 MG capsule Take 15 mg by mouth at bedtime as needed. 10/22/23  Yes [provider]  torsemide  (DEMADEX ) 20 MG tablet Take 3 tablets (60 mg total) by mouth daily. 03/10/24  Yes Hackney, Ellouise A, FNP  traZODone  (DESYREL ) 100 MG tablet Take 100 mg by mouth daily as needed for sleep. Patient taking differently: Take 100 mg by mouth at bedtime. 08/31/20  Yes [provider]  venlafaxine  XR (EFFEXOR -XR) 75 MG 24 hr capsule Take 75 mg by mouth daily with breakfast. 07/15/23 07/14/24 Yes [provider]  ACCU-CHEK GUIDE TEST test strip  04/05/23   [provider]  Accu-Chek Softclix Lancets lancets SMARTSIG:Lancet Topical 04/05/23   [provider]  OXYGEN  Inhale 2 L/L into  the lungs at bedtime.    [provider]    Social History:  reports that she has been smoking cigarettes. She has a 60 pack-year smoking history. She has never used smokeless tobacco. She reports that she does not currently use alcohol. She reports that she does not currently use drugs.    Physical Exam: Vitals:   04/25/24 1643 04/25/24 1730 04/25/24 1830 04/25/24 1928  BP: ROLLEN)  171/60 (!) 148/50 (!) 143/48   Pulse: 65 69 73   Resp: 20 18 19    Temp:    98.3 F (36.8 C)  TempSrc:    Oral  SpO2: 97% 95% 96%   Weight:        Gen: NAD, pleasant female appearing older than stated age extremities HENT: NCAT CV: normal heart sounds Lung: Diminished breath sounds, bibasilar rales present, expiratory wheeze Abd: No TTP, normal bowel sounds MSK: No asymmetry, good bulk and tone Neuro: alert and oriented   Labs on Admission: I have personally reviewed following labs and imaging studies  CBC: Recent Labs  Lab 04/25/24 1438  WBC 9.3  HGB 8.8*  HCT 31.2*  MCV 89.4  PLT 261   Basic Metabolic Panel: Recent Labs  Lab 04/19/24 1013 04/25/24 1438  NA 141 141  K 4.5 4.6  CL 102 103  CO2 24 27  GLUCOSE 77 122*  BUN 40* 55*  CREATININE 2.14* 2.51*  CALCIUM  9.3 9.6   GFR: Estimated Creatinine Clearance: 21.5 mL/min (A) (by C-G formula based on SCr of 2.51 mg/dL (H)). Liver Function Tests: No results for input(s): AST, ALT, ALKPHOS, BILITOT, PROT, ALBUMIN  in the last 168 hours. No results for input(s): LIPASE, AMYLASE in the last 168 hours. No results for input(s): AMMONIA in the last 168 hours. Coagulation Profile: No results for input(s): INR, PROTIME in the last 168 hours. Cardiac Enzymes: No results for input(s): CKTOTAL, CKMB, CKMBINDEX, TROPONINI, TROPONINIHS in the last 168 hours. BNP (last 3 results) Recent Labs    06/29/23 0945 10/19/23 1523 02/25/24 1010  BNP 192.3* 851.2* 651.3*   HbA1C: No results for input(s):  HGBA1C in the last 72 hours. CBG: No results for input(s): GLUCAP in the last 168 hours. Lipid Profile: No results for input(s): CHOL, HDL, LDLCALC, TRIG, CHOLHDL, LDLDIRECT in the last 72 hours. Thyroid  Function Tests: No results for input(s): TSH, T4TOTAL, FREET4, T3FREE, THYROIDAB in the last 72 hours. Anemia Panel: No results for input(s): VITAMINB12, FOLATE, FERRITIN, TIBC, IRON , RETICCTPCT in the last 72 hours. Urine analysis:    Component Value Date/Time   COLORURINE STRAW (A) 07/07/2023 0035   APPEARANCEUR CLEAR (A) 07/07/2023 0035   LABSPEC 1.009 07/07/2023 0035   PHURINE 6.0 07/07/2023 0035   GLUCOSEU 50 (A) 07/07/2023 0035   HGBUR NEGATIVE 07/07/2023 0035   BILIRUBINUR NEGATIVE 07/07/2023 0035   KETONESUR NEGATIVE 07/07/2023 0035   PROTEINUR NEGATIVE 07/07/2023 0035   NITRITE NEGATIVE 07/07/2023 0035   LEUKOCYTESUR NEGATIVE 07/07/2023 0035    Radiological Exams on Admission: I have personally reviewed images DG Chest 2 View Result Date: 04/25/2024 EXAM: 2 VIEW(S) XRAY OF THE CHEST 04/25/2024 01:39:41 PM COMPARISON: 03/10/2024 CLINICAL HISTORY: SOB FINDINGS: LINES, TUBES AND DEVICES: A left subclavian stent is noted. LUNGS AND PLEURA: Mild pulmonary edema is present. Stable bilateral interstitial prominence is noted. No pleural effusion. No pneumothorax. HEART AND MEDIASTINUM: Stable cardiomediastinal silhouette with mild cardiomegaly. Aortic arch atherosclerosis is present. BONES AND SOFT TISSUES: Thoracic spondylosis is present. IMPRESSION: 1. Perihilar vascular congestion with diffuse interstitial prominence, consistent with pulmonary interstitial edema. 2. Stable cardiomediastinal silhouette with mild cardiomegaly and aortic arch atherosclerosis. Electronically signed by: Morene Hoard MD 04/25/2024 02:16 PM EST RP Workstation: [id number]    EKG: My personal interpretation of EKG shows: Normal sinus rhythm acute ST  changes    Assessment/Plan Principal Problem:   Acute on chronic hypoxic respiratory failure (HCC) Active Problems:   Chronic heart failure with preserved ejection fraction (HFpEF) (  HCC)   Hypothyroidism   Chronic obstructive pulmonary disease (HCC)   Essential hypertension   Hyperlipidemia, unspecified   Anxiety and depression   GERD without esophagitis   CKD (chronic kidney disease), stage IV (HCC)   Pulmonary hypertension, unspecified (HCC)   Diabetes mellitus with peripheral vascular disease (HCC)   Tobacco use disorder   Acute hypoxic respiratory failure secondary to COPD and CHF exacerbation.  Will treat both as outlined below.  Discussed with patient at length the importance of tobacco cessation given her poor baseline lung function.  She states she is aware of this and would like to continue smoking even if it meant dying early.  Will give patient IV Lasix  80 mg twice daily, monitor strict input and output, and repeat echocardiogram this admission.  Will monitor creatinine closely as we diurese the patient.  If worsening may need to consult nephrology as she established with them.  Will treat COPD exacerbation with prednisone  and azithromycin .  Goals of care: Discussed with patient that smoking can lead to worsening lung function and reover critical organs are already damaged increasing her chance of having poor outcome during hospitalization.  Discussed importance of  smoking cessation on her health.  Patient states she will die of smoking.  When discussed discussed CODE STATUS, she stated she wanted to be DNR/DNI as she has thought about this before.  Continue to revisit this with the patient given its importance.   Chronic Problems: Restart home meds once med reconciliation is completed.  HTN: On diuretics as above.  Will monitor blood pressure closely as we diurese patient HLD: continue home Lipitor, Zetia  and aspirin  GERD: continue home PPI Hypothyroidism: continue home  synthroid  MDD/GAD: continue home meds CKDIV: At baseline.  Monitor renal fxn daily, and renally dose meds, avoid nephrotoxic meds ILD/pulmonary hypertension: Follows with pulmonology.  Last visit in 07/2023.  There is still adherence to CPAP and smoking cessation with patient declined. Type 2 diabetes: Last A1c 5.6.  She is on SGLT2 inhibitor.  Will place on sSSI   VTE prophylaxis:  SQ Heparin   Diet: Heart healthy Code Status:  Full Code Telemetry:  Admission status: Inpatient, Telemetry bed Patient is from: Home Anticipated d/c is to: Home Anticipated d/c is in: 2-3 days   Family Communication: Updated at bedside  Consults called: None   Severity of Illness: The appropriate patient status for this patient is INPATIENT. Inpatient status is judged to be reasonable and necessary in order to provide the required intensity of service to ensure the patient's safety. The patient's presenting symptoms, physical exam findings, and initial radiographic and laboratory data in the context of their chronic comorbidities is felt to place them at high risk for further clinical deterioration. Furthermore, it is not anticipated that the patient will be medically stable for discharge from the hospital within 2 midnights of admission.   * I certify that at the point of admission it is my clinical judgment that the patient will require inpatient hospital care spanning beyond 2 midnights from the point of admission due to high intensity of service, high risk for further deterioration and high frequency of surveillance required.DEWAINE Morene Bathe, MD Jolynn DEL. [facility]     [1]  Allergies Allergen Reactions   Hydrocodone -Acetaminophen  Hives   Oxycodone  Nausea Only    GI upset, felt faint-per pt   Vicodin [Hydrocodone -Acetaminophen ] Hives   Trelegy Ellipta [Fluticasone-Umeclidin-Vilant] Rash    Changed to Budeson-Glycopyrrol-Formoterol  by provider   "

## 2024-04-25 NOTE — ED Notes (Signed)
 Lab contacted to check on status of labs sent at time of triage, states unable to locate samples. Labs redrawn and sent at this time

## 2024-04-25 NOTE — ED Notes (Signed)
 Called CCMD for central monitoring at this time

## 2024-04-25 NOTE — ED Triage Notes (Signed)
 First nurse note: pt to ED [facility] from home for shob for a few days, worsening today while taking shower. Started on diuretics last week. 82% on baseline 2L [place], increased to 6L [place] with EMS.

## 2024-04-25 NOTE — ED Provider Notes (Signed)
 "  [facility] Provider Note    Event Date/Time   First MD Initiated Contact with Patient 04/25/24 1633     (approximate)   History   Shortness of Breath   HPI  Misty Adams is a 69 y.o. female who presents to the ED for evaluation of Shortness of Breath   I review a CHF clinic visit from 1 week ago.  History of COPD, pulmonary hypertension, diastolic CHF, CAD and CKD.  Subclavian stenosis.  Continued smoking  Patient presents with shortness of breath, headaches, cough and feeling unwell over the past few days.  5 pounds weight gain over the past few days despite adherence to diuretics and normal urinary output.   Physical Exam   Triage Vital Signs: ED Triage Vitals  Encounter Vitals Group     BP 04/25/24 1318 (!) 153/48     Girls Systolic BP Percentile --      Girls Diastolic BP Percentile --      Boys Systolic BP Percentile --      Boys Diastolic BP Percentile --      Pulse Rate 04/25/24 1318 68     Resp 04/25/24 1318 18     Temp 04/25/24 1318 98.4 F (36.9 C)     Temp Source 04/25/24 1318 Oral     SpO2 04/25/24 1318 92 %     Weight 04/25/24 1320 179 lb 10.8 oz (81.5 kg)     Height --      Head Circumference --      Peak Flow --      Pain Score 04/25/24 1319 6     Pain Loc --      Pain Education --      Exclude from Growth Chart --     Most recent vital signs: Vitals:   04/25/24 1830 04/25/24 1928  BP: (!) 143/48   Pulse: 73   Resp: 19   Temp:  98.3 F (36.8 C)  SpO2: 96%     General: Awake, no distress.  CV:  Good peripheral perfusion.  Resp:  Tachypneic to the mid 20s, no distress, poor airflow and wheezing throughout, peripheral edema Abd:  No distention.  MSK:  No deformity noted.  Neuro:  No focal deficits appreciated. Other:     ED Results / Procedures / Treatments   Labs (all labs ordered are listed, but only abnormal results are displayed) Labs Reviewed  BASIC METABOLIC PANEL WITH GFR - Abnormal; Notable  for the following components:      Result Value   Glucose, Bld 122 (*)    BUN 55 (*)    Creatinine, Ser 2.51 (*)    GFR, Estimated 20 (*)    All other components within normal limits  CBC - Abnormal; Notable for the following components:   RBC 3.49 (*)    Hemoglobin 8.8 (*)    HCT 31.2 (*)    MCH 25.2 (*)    MCHC 28.2 (*)    RDW 23.0 (*)    All other components within normal limits  PRO BRAIN NATRIURETIC PEPTIDE - Abnormal; Notable for the following components:   Pro Brain Natriuretic Peptide 3,335.0 (*)    All other components within normal limits  TROPONIN T, HIGH SENSITIVITY - Abnormal; Notable for the following components:   Troponin T High Sensitivity 47 (*)    All other components within normal limits  TROPONIN T, HIGH SENSITIVITY - Abnormal; Notable for the following components:   Troponin T  High Sensitivity 50 (*)    All other components within normal limits  RESP PANEL BY RT-PCR (RSV, FLU A&B, COVID)  RVPGX2  MAGNESIUM   BASIC METABOLIC PANEL WITH GFR  CBC    EKG Sinus rhythm with a rate of 67 bpm.  Normal axis and intervals.  No clear signs of acute ischemia.  RADIOLOGY 2 view CXR interpreted by me with pulmonary vascular congestion  Official radiology report(s): DG Chest 2 View Result Date: 04/25/2024 EXAM: 2 VIEW(S) XRAY OF THE CHEST 04/25/2024 01:39:41 PM COMPARISON: 03/10/2024 CLINICAL HISTORY: SOB FINDINGS: LINES, TUBES AND DEVICES: A left subclavian stent is noted. LUNGS AND PLEURA: Mild pulmonary edema is present. Stable bilateral interstitial prominence is noted. No pleural effusion. No pneumothorax. HEART AND MEDIASTINUM: Stable cardiomediastinal silhouette with mild cardiomegaly. Aortic arch atherosclerosis is present. BONES AND SOFT TISSUES: Thoracic spondylosis is present. IMPRESSION: 1. Perihilar vascular congestion with diffuse interstitial prominence, consistent with pulmonary interstitial edema. 2. Stable cardiomediastinal silhouette with mild  cardiomegaly and aortic arch atherosclerosis. Electronically signed by: Morene Hoard MD 04/25/2024 02:16 PM EST RP Workstation: [id number]    PROCEDURES and INTERVENTIONS:  .1-3 Lead EKG Interpretation  Performed by: Claudene Rover, MD Authorized by: Claudene Rover, MD     Interpretation: normal     ECG rate:  70   ECG rate assessment: normal     Rhythm: sinus rhythm     Ectopy: none     Conduction: normal   .Critical Care  Performed by: Claudene Rover, MD Authorized by: Claudene Rover, MD   Critical care provider statement:    Critical care time (minutes):  30   Critical care time was exclusive of:  Separately billable procedures and treating other patients   Critical care was necessary to treat or prevent imminent or life-threatening deterioration of the following conditions:  Respiratory failure   Critical care was time spent personally by me on the following activities:  Development of treatment plan with patient or surrogate, discussions with consultants, evaluation of patient's response to treatment, examination of patient, ordering and review of laboratory studies, ordering and review of radiographic studies, ordering and performing treatments and interventions, pulse oximetry, re-evaluation of patient's condition and review of old charts   Medications  sodium chloride  flush (NS) 0.9 % injection 3 mL (has no administration in time range)  acetaminophen  (TYLENOL ) tablet 650 mg (has no administration in time range)    Or  acetaminophen  (TYLENOL ) suppository 650 mg (has no administration in time range)  senna-docusate (Senokot-S) tablet 1 tablet (has no administration in time range)  heparin  injection 5,000 Units (has no administration in time range)  ondansetron  (ZOFRAN ) tablet 4 mg (has no administration in time range)    Or  ondansetron  (ZOFRAN ) injection 4 mg (has no administration in time range)  ipratropium-albuterol  (DUONEB) 0.5-2.5 (3) MG/3ML nebulizer solution 6 mL (6  mLs Nebulization Given 04/25/24 1704)  methylPREDNISolone  sodium succinate (SOLU-MEDROL ) 125 mg/2 mL injection 125 mg (125 mg Intravenous Given 04/25/24 1701)  furosemide  (LASIX ) injection 80 mg (80 mg Intravenous Given 04/25/24 1701)  acetaminophen  (TYLENOL ) tablet 1,000 mg (1,000 mg Oral Given 04/25/24 1700)  ipratropium-albuterol  (DUONEB) 0.5-2.5 (3) MG/3ML nebulizer solution 6 mL (6 mLs Nebulization Given 04/25/24 2014)     IMPRESSION / MDM / ASSESSMENT AND PLAN / ED COURSE  I reviewed the triage vital signs and the nursing notes.  Differential diagnosis includes, but is not limited to, ACS, PTX, PNA, muscle strain/spasm, PE, dissection, anxiety, pleural effusion  {Patient presents with symptoms  of an acute illness or injury that is potentially life-threatening.  Patient presents with signs of COPD and CHF exacerbations with increased O2 requirement from baseline, requiring medical admission.  On 4 L instead of baseline 2.  Appears volume overload with elevated BNP, congested x-ray and clinically volume up.  Also wheezing with poor airflow signs of COPD exacerbation.  Normal WBC and chronic anemia, renal dysfunction at baseline.  Despite initiating diuresis, steroids and multiple rounds of nebulizers she is still quite tachypneic and dyspneic and will require medical admission.  I consult hospitalist for admission.     FINAL CLINICAL IMPRESSION(S) / ED DIAGNOSES   Final diagnoses:  COPD exacerbation (HCC)  Shortness of breath  Hypoxia  Acute on chronic diastolic congestive heart failure (HCC)     Rx / DC Orders   ED Discharge Orders     None        Note:  This document was prepared using Dragon voice recognition software and may include unintentional dictation errors.   Claudene Rover, MD 04/25/24 2039  "

## 2024-04-26 ENCOUNTER — Encounter: Payer: Self-pay | Admitting: Internal Medicine

## 2024-04-26 DIAGNOSIS — F32A Depression, unspecified: Secondary | ICD-10-CM

## 2024-04-26 DIAGNOSIS — F419 Anxiety disorder, unspecified: Secondary | ICD-10-CM

## 2024-04-26 DIAGNOSIS — J441 Chronic obstructive pulmonary disease with (acute) exacerbation: Secondary | ICD-10-CM

## 2024-04-26 DIAGNOSIS — K219 Gastro-esophageal reflux disease without esophagitis: Secondary | ICD-10-CM

## 2024-04-26 DIAGNOSIS — F17210 Nicotine dependence, cigarettes, uncomplicated: Secondary | ICD-10-CM

## 2024-04-26 DIAGNOSIS — I10 Essential (primary) hypertension: Secondary | ICD-10-CM

## 2024-04-26 LAB — CBC
HCT: 28.6 % — ABNORMAL LOW (ref 36.0–46.0)
Hemoglobin: 8 g/dL — ABNORMAL LOW (ref 12.0–15.0)
MCH: 25 pg — ABNORMAL LOW (ref 26.0–34.0)
MCHC: 28 g/dL — ABNORMAL LOW (ref 30.0–36.0)
MCV: 89.4 fL (ref 80.0–100.0)
Platelets: 267 K/uL (ref 150–400)
RBC: 3.2 MIL/uL — ABNORMAL LOW (ref 3.87–5.11)
RDW: 22.9 % — ABNORMAL HIGH (ref 11.5–15.5)
WBC: 7.8 K/uL (ref 4.0–10.5)
nRBC: 0.3 % — ABNORMAL HIGH (ref 0.0–0.2)

## 2024-04-26 LAB — BASIC METABOLIC PANEL WITH GFR
Anion gap: 12 (ref 5–15)
BUN: 62 mg/dL — ABNORMAL HIGH (ref 8–23)
CO2: 24 mmol/L (ref 22–32)
Calcium: 8.9 mg/dL (ref 8.9–10.3)
Chloride: 102 mmol/L (ref 98–111)
Creatinine, Ser: 2.69 mg/dL — ABNORMAL HIGH (ref 0.44–1.00)
GFR, Estimated: 18 mL/min — ABNORMAL LOW
Glucose, Bld: 205 mg/dL — ABNORMAL HIGH (ref 70–99)
Potassium: 4.2 mmol/L (ref 3.5–5.1)
Sodium: 138 mmol/L (ref 135–145)

## 2024-04-26 LAB — CBG MONITORING, ED
Glucose-Capillary: 282 mg/dL — ABNORMAL HIGH (ref 70–99)
Glucose-Capillary: 148 mg/dL — ABNORMAL HIGH (ref 70–99)
Glucose-Capillary: 174 mg/dL — ABNORMAL HIGH (ref 70–99)

## 2024-04-26 LAB — GLUCOSE, CAPILLARY
Glucose-Capillary: 160 mg/dL — ABNORMAL HIGH (ref 70–99)
Glucose-Capillary: 144 mg/dL — ABNORMAL HIGH (ref 70–99)

## 2024-04-26 MED ORDER — INSULIN ASPART 100 UNIT/ML IJ SOLN
0.0000 [IU] | Freq: Three times a day (TID) | INTRAMUSCULAR | Status: DC
  Administered 2024-04-26: 2 [IU] via SUBCUTANEOUS
  Administered 2024-04-26: 1 [IU] via SUBCUTANEOUS
  Administered 2024-04-26 – 2024-04-27 (×2): 2 [IU] via SUBCUTANEOUS
  Administered 2024-04-27: 1 [IU] via SUBCUTANEOUS
  Administered 2024-04-28: 2 [IU] via SUBCUTANEOUS
  Filled 2024-04-26 (×4): qty 2
  Filled 2024-04-26: qty 1
  Filled 2024-04-26 (×2): qty 2

## 2024-04-26 MED ORDER — NICOTINE 21 MG/24HR TD PT24
21.0000 mg | MEDICATED_PATCH | Freq: Every day | TRANSDERMAL | Status: DC
  Filled 2024-04-26 (×3): qty 1

## 2024-04-26 MED ORDER — FUROSEMIDE 10 MG/ML IJ SOLN: 40.0000 mg | Freq: Every day | INTRAMUSCULAR | Status: DC

## 2024-04-26 MED ORDER — GUAIFENESIN-DM 100-10 MG/5ML PO SYRP
5.0000 mL | ORAL_SOLUTION | ORAL | Status: DC | PRN
  Administered 2024-04-26: 5 mL via ORAL
  Filled 2024-04-26: qty 10

## 2024-04-26 MED ORDER — AZITHROMYCIN 250 MG PO TABS
500.0000 mg | ORAL_TABLET | Freq: Every day | ORAL | Status: AC
  Administered 2024-04-26 – 2024-04-27 (×2): 500 mg via ORAL
  Filled 2024-04-26 (×2): qty 2

## 2024-04-26 MED ORDER — INSULIN ASPART 100 UNIT/ML IJ SOLN
0.0000 [IU] | Freq: Every day | INTRAMUSCULAR | Status: DC
  Administered 2024-04-26: 3 [IU] via SUBCUTANEOUS
  Filled 2024-04-26: qty 3

## 2024-04-26 NOTE — Progress Notes (Signed)
 PHARMACIST - PHYSICIAN COMMUNICATION DR:   TRH CONCERNING: Antibiotic IV to Oral Route Change Policy  RECOMMENDATION: This patient is receiving Azithromycin  by the intravenous route.  Based on criteria approved by the Pharmacy and Therapeutics Committee, the antibiotic(s) is/are being converted to the equivalent oral dose form(s).   DESCRIPTION: These criteria include: Patient being treated for a respiratory tract infection, urinary tract infection, cellulitis or clostridium difficile associated diarrhea if on metronidazole The patient is not neutropenic and does not exhibit a GI malabsorption state The patient is eating (either orally or via tube) and/or has been taking other orally administered medications for a least 24 hours The patient is improving clinically and has a Tmax < 100.5  If you have questions about this conversion, please contact the Pharmacy Department  []   [phone] )  [facility] [x]   [phone] )  [facility] []   [phone] )  [facility] []   [phone] )  [facility] []   [phone] )  [facility]    Due to shortage of IV azithromycin , change to oral   Zamira Hickam, PharmD, BCPS, BCIDP Work Cell: [phone] 04/26/2024 12:57 PM

## 2024-04-26 NOTE — ED Notes (Signed)
 Pt assisted with ambulation to the restroom and back to bed. Reports headache is returning, pain currently 3/10, requests PRN tylenol . PRN order administered. Denies any additional needs at this time, NAD noted, call bell within reach

## 2024-04-26 NOTE — Progress Notes (Signed)
 Heart Failure Navigator Progress Note  Assessed for Heart & Vascular TOC clinic readiness.  Patient is a current Advanced Heart Failure Team patient. She has a previously scheduled follow-up on 05/12/24 @ 8:30 with [facility].  Will add appointment to patient AVS.  Navigator available for reassessment of patient but will sign off at this time.  Charmaine Pines, RN, BSN [facility] Heart Failure Navigator Secure Chat Only

## 2024-04-26 NOTE — ED Notes (Signed)
 NURSE BRANDON RN INFORMED OF BED ASSIGNED

## 2024-04-26 NOTE — Progress Notes (Signed)
 " Progress Note   Patient: Misty Adams FMW:[id number] DOB: 1955/11/29 DOA: 04/25/2024     1 DOS: the patient was seen and examined on 04/26/2024   Brief hospital course: Misty Adams is a 69 y.o. year old female with medical history of hypertension, hyperlipidemia, type 2 diabetes, hypothyroidism, CHF (EF 60%, G2 DD and 02/2023), chronic hypoxic respiratory failure secondary to COPD, pulmonary hypertension, ILD on chronic 2 L presented to the ED with worsening shortness of breath.  Laboratory workup showed elevated proBNP, chest x-ray concerning for fluid overload admitted to hospitalist service for acute on chronic hypoxic respiratory failure, CHF exacerbation.  Assessment and Plan: Acute on chronic hypoxic respiratory failure- Multifactorial in the setting of COPD and CHF, pulmonary hypertension, chronic smoker. Continue IV Lasix , steroids, bronchodilator therapy. Continue to wean supplemental oxygen  to baseline 2 L nasal cannula.  Acute on chronic diastolic CHF: Patient is currently on IV Lasix  60 mg twice daily which I decreased to 40 mg daily given her worsening renal function. Continue to monitor daily weights, strict input and output. Monitor daily renal function, electrolytes.  COPD exacerbation: Patient will be continued oral prednisone  and azithromycin  therapy. Continue home inhalers. DuoNebs every 6 as needed. Encourage out of bed, incentive spirometry, flutter valve. Encouraged to quit smoking.  Tobacco abuse: Discussed with her the need to quit smoking.  Ill effects of tobacco explained.  Patient does not want to stop smoking, states she smokes 1-1/2 pack/day. Nicotine  patch ordered. Time spent on smoking cessation counseling 5 minutes.  Hyperlipidemia: Continue home dose Lipitor, Zetia   GERD: On PPI.  Acute on CKD stage IV: Bump in her creatinine due to IV diuresis.  I decreased Lasix  dose to 40 mg daily from tomorrow. Monitor daily renal function. Avoid  nephrotoxic drugs.  Type 2 diabetes mellitus: A1c 5.6.  Sugars elevated due to steroids. Continue Accu-Cheks, sliding scale insulin , SGLT2.  Obesity class I: BMI 30.82. Diet, exercise and weight reduction advised.  Chronic anemia: No active bleeding.  Hemoglobin stable around 8.      Out of bed to chair. Incentive spirometry. Nursing supportive care. Fall, aspiration precautions. Diet:  Diet Orders (From admission, onward)     Start     Ordered   04/25/24 2012  Diet Heart Room service appropriate? Yes; Fluid consistency: Thin  Diet effective now       Question Answer Comment  Room service appropriate? Yes   Fluid consistency: Thin      04/25/24 2012           DVT prophylaxis: heparin  injection 5,000 Units Start: 04/25/24 2200  Level of care: Telemetry   Code Status: Limited: Do not attempt resuscitation (DNR) -DNR-LIMITED -Do Not Intubate/DNI   Subjective: Patient is seen and examined today morning.  She is lying in bed, currently on 3 to 4 L supplemental oxygen .  Daughter at bedside.  Her breathing is better.  States that she does not want to quit smoking, wishes to be DNR.  Physical Exam: Vitals:   04/26/24 0730 04/26/24 1030 04/26/24 1212 04/26/24 1330  BP: (!) 117/44 (!) 144/53  (!) 132/95  Pulse: 64 72  74  Resp: 18 19  18   Temp:   98 F (36.7 C)   TempSrc:   Oral   SpO2: 100% 95%  90%  Weight:        General - Elderly obese Caucasian female, mild respiratory distress HEENT - PERRLA, EOMI, atraumatic head, non tender sinuses. Lung - Clear, basal rales,  diffuse rhonchi, wheezes. Heart - S1, S2 heard, no murmurs, rubs, 1+ pedal edema. Abdomen - Soft, non tender, obese, bowel sounds good Neuro - Alert, awake and oriented x 3, non focal exam. Skin - Warm and dry.  Data Reviewed:      Latest Ref Rng & Units 04/26/2024    3:25 AM 04/25/2024    2:38 PM 04/05/2024    2:36 PM  CBC  WBC 4.0 - 10.5 K/uL 7.8  9.3  11.3   Hemoglobin 12.0 - 15.0 g/dL 8.0   8.8  89.7   Hematocrit 36.0 - 46.0 % 28.6  31.2  36.6   Platelets 150 - 400 K/uL 267  261  295       Latest Ref Rng & Units 04/26/2024    3:25 AM 04/25/2024    2:38 PM 04/19/2024   10:13 AM  BMP  Glucose 70 - 99 mg/dL 794  877  77   BUN 8 - 23 mg/dL 62  55  40   Creatinine 0.44 - 1.00 mg/dL 7.30  7.48  7.85   BUN/Creat Ratio 12 - 28   19   Sodium 135 - 145 mmol/L 138  141  141   Potassium 3.5 - 5.1 mmol/L 4.2  4.6  4.5   Chloride 98 - 111 mmol/L 102  103  102   CO2 22 - 32 mmol/L 24  27  24    Calcium  8.9 - 10.3 mg/dL 8.9  9.6  9.3    DG Chest 2 View Result Date: 04/25/2024 EXAM: 2 VIEW(S) XRAY OF THE CHEST 04/25/2024 01:39:41 PM COMPARISON: 03/10/2024 CLINICAL HISTORY: SOB FINDINGS: LINES, TUBES AND DEVICES: A left subclavian stent is noted. LUNGS AND PLEURA: Mild pulmonary edema is present. Stable bilateral interstitial prominence is noted. No pleural effusion. No pneumothorax. HEART AND MEDIASTINUM: Stable cardiomediastinal silhouette with mild cardiomegaly. Aortic arch atherosclerosis is present. BONES AND SOFT TISSUES: Thoracic spondylosis is present. IMPRESSION: 1. Perihilar vascular congestion with diffuse interstitial prominence, consistent with pulmonary interstitial edema. 2. Stable cardiomediastinal silhouette with mild cardiomegaly and aortic arch atherosclerosis. Electronically signed by: Morene Hoard MD 04/25/2024 02:16 PM EST RP Workstation: [id number]    Family Communication: Discussed with patient, daughter at bedside.  They understand and agree. All questions answered.  Disposition: Status is: Inpatient Remains inpatient appropriate because: Hypoxia, IV Lasix , steroids, PT OT, wean O2  Planned Discharge Destination: Home with Home Health     Time spent: 51 minutes  Author: Concepcion Riser, MD 04/26/2024 2:33 PM Secure chat 7am to 7pm For on call review [email or web address]    "

## 2024-04-26 NOTE — Progress Notes (Signed)
 Patient admitted to bed. Placed on tele. Notified CCMD.

## 2024-04-27 DIAGNOSIS — F17200 Nicotine dependence, unspecified, uncomplicated: Secondary | ICD-10-CM | POA: Diagnosis not present

## 2024-04-27 DIAGNOSIS — J432 Centrilobular emphysema: Secondary | ICD-10-CM | POA: Diagnosis not present

## 2024-04-27 DIAGNOSIS — N184 Chronic kidney disease, stage 4 (severe): Secondary | ICD-10-CM | POA: Diagnosis not present

## 2024-04-27 DIAGNOSIS — J9621 Acute and chronic respiratory failure with hypoxia: Secondary | ICD-10-CM | POA: Diagnosis not present

## 2024-04-27 DIAGNOSIS — I5032 Chronic diastolic (congestive) heart failure: Secondary | ICD-10-CM | POA: Diagnosis not present

## 2024-04-27 DIAGNOSIS — E1151 Type 2 diabetes mellitus with diabetic peripheral angiopathy without gangrene: Secondary | ICD-10-CM | POA: Diagnosis not present

## 2024-04-27 DIAGNOSIS — I5033 Acute on chronic diastolic (congestive) heart failure: Secondary | ICD-10-CM | POA: Diagnosis not present

## 2024-04-27 LAB — CBC
HCT: 26.8 % — ABNORMAL LOW (ref 36.0–46.0)
Hemoglobin: 7.7 g/dL — ABNORMAL LOW (ref 12.0–15.0)
MCH: 25 pg — ABNORMAL LOW (ref 26.0–34.0)
MCHC: 28.7 g/dL — ABNORMAL LOW (ref 30.0–36.0)
MCV: 87 fL (ref 80.0–100.0)
Platelets: 283 K/uL (ref 150–400)
RBC: 3.08 MIL/uL — ABNORMAL LOW (ref 3.87–5.11)
RDW: 22.6 % — ABNORMAL HIGH (ref 11.5–15.5)
WBC: 11.8 K/uL — ABNORMAL HIGH (ref 4.0–10.5)
nRBC: 0 % (ref 0.0–0.2)

## 2024-04-27 LAB — COMPREHENSIVE METABOLIC PANEL WITH GFR
ALT: 12 U/L (ref 0–44)
AST: 15 U/L (ref 15–41)
Albumin: 3.4 g/dL — ABNORMAL LOW (ref 3.5–5.0)
Alkaline Phosphatase: 56 U/L (ref 38–126)
Anion gap: 10 (ref 5–15)
BUN: 71 mg/dL — ABNORMAL HIGH (ref 8–23)
CO2: 27 mmol/L (ref 22–32)
Calcium: 9.3 mg/dL (ref 8.9–10.3)
Chloride: 101 mmol/L (ref 98–111)
Creatinine, Ser: 2.69 mg/dL — ABNORMAL HIGH (ref 0.44–1.00)
GFR, Estimated: 18 mL/min — ABNORMAL LOW
Glucose, Bld: 119 mg/dL — ABNORMAL HIGH (ref 70–99)
Potassium: 4.5 mmol/L (ref 3.5–5.1)
Sodium: 137 mmol/L (ref 135–145)
Total Bilirubin: 0.3 mg/dL (ref 0.0–1.2)
Total Protein: 6 g/dL — ABNORMAL LOW (ref 6.5–8.1)

## 2024-04-27 LAB — GLUCOSE, CAPILLARY
Glucose-Capillary: 114 mg/dL — ABNORMAL HIGH (ref 70–99)
Glucose-Capillary: 182 mg/dL — ABNORMAL HIGH (ref 70–99)
Glucose-Capillary: 146 mg/dL — ABNORMAL HIGH (ref 70–99)
Glucose-Capillary: 193 mg/dL — ABNORMAL HIGH (ref 70–99)

## 2024-04-27 LAB — MAGNESIUM: Magnesium: 2.6 mg/dL — ABNORMAL HIGH (ref 1.7–2.4)

## 2024-04-27 MED ORDER — IPRATROPIUM-ALBUTEROL 0.5-2.5 (3) MG/3ML IN SOLN
3.0000 mL | Freq: Four times a day (QID) | RESPIRATORY_TRACT | Status: DC | PRN
  Administered 2024-04-27: 3 mL via RESPIRATORY_TRACT
  Filled 2024-04-27: qty 3

## 2024-04-27 MED ORDER — METOCLOPRAMIDE HCL 5 MG/ML IJ SOLN
5.0000 mg | Freq: Once | INTRAMUSCULAR | Status: AC
  Administered 2024-04-28: 5 mg via INTRAVENOUS
  Filled 2024-04-27: qty 2

## 2024-04-27 MED ORDER — GABAPENTIN 100 MG PO CAPS
200.0000 mg | ORAL_CAPSULE | Freq: Every day | ORAL | Status: DC
  Administered 2024-04-27 – 2024-04-28 (×2): 200 mg via ORAL
  Filled 2024-04-27 (×2): qty 2

## 2024-04-27 MED ORDER — DIPHENHYDRAMINE HCL 50 MG/ML IJ SOLN
12.5000 mg | Freq: Once | INTRAMUSCULAR | Status: AC
  Administered 2024-04-28: 12.5 mg via INTRAVENOUS
  Filled 2024-04-27: qty 1

## 2024-04-27 MED ORDER — BUMETANIDE 0.25 MG/ML IJ SOLN
2.0000 mg | Freq: Two times a day (BID) | INTRAMUSCULAR | Status: AC
  Administered 2024-04-27 – 2024-04-28 (×4): 2 mg via INTRAVENOUS
  Filled 2024-04-27 (×6): qty 8

## 2024-04-27 NOTE — Progress Notes (Signed)
 " Progress Note   Patient: Misty Adams FMW:[id number] DOB: 1956/04/19 DOA: 04/25/2024  DOS: the patient was seen and examined on 04/27/2024   Brief hospital course:  69 y.o. year old female with medical history of hypertension, hyperlipidemia, type 2 diabetes, hypothyroidism, CHF (EF 60%, G2 DD and 02/2023), chronic hypoxic respiratory failure secondary to COPD, pulmonary hypertension, ILD on chronic 2 L presented to the ED with worsening shortness of breath.  Laboratory workup showed elevated proBNP, chest x-ray concerning for fluid overload admitted to hospitalist service for acute on chronic hypoxic respiratory failure, CHF exacerbation.   Assessment and Plan:  Acute on chronic hypoxic respiratory failure - Baseline 2 L nasal cannula, currently needing 3-4 L.  Continue to wean O2 as tolerated.  Acute exacerbation of chronic HFpEF - Slightly worsening renal function with plateaued O2.  Home dose of torsemide  is 60 mg.  Possibility that decreased IV Lasix  not enough.  Will transition to IV Bumex  2 mg every 12 hours for now.  Monitor urine output.  Wean O2 as tolerated  COPD exacerbation - Appears to be showing improvement.  Currently on oral prednisone , nebulizers.  Encouraged tobacco cessation.  Tobacco abuse - States she does not want to quit smoking, currently smoking 1-1/2 packs/day.  Continue to encourage cessation.  AKI on CKD 4 - Mild creatinine elevation is stable since yesterday but above baseline.  Possibly cardiorenal etiology on top of CKD.  Monitor urine output and recheck BMP in AM.  Diabetes mellitus - Insulin  sliding scale poor.  Chronic anemia - No active bleeding appreciated.  Hemoglobin stable.  Will recheck CBC in AM.   Subjective: Patient standing at the bedside, feeling improved however still short of breath on exertion.  Currently on 4 L nasal cannula.  Not quite feeling well enough to go home.  Denies any fever, purulent sputum, chest pain, nausea, vomiting,  abdominal pain.  Physical Exam:  Vitals:   04/27/24 0536 04/27/24 0822 04/27/24 0834 04/27/24 1121  BP:  (!) 147/63  (!) 165/65  Pulse:  66  68  Resp:      Temp:  (!) 97.5 F (36.4 C)    TempSrc:      SpO2:  96% 95% 92%  Weight: 84.1 kg       GENERAL:  Alert, pleasant, no acute distress  HEENT:  EOMI, nasal cannula CARDIOVASCULAR:  RRR, no murmurs appreciated RESPIRATORY: Poor air movement bilaterally GASTROINTESTINAL:  Soft, nontender, nondistended EXTREMITIES:  No LE edema bilaterally NEURO:  No new focal deficits appreciated SKIN:  No rashes noted PSYCH:  Appropriate mood and affect     Data Reviewed:  Imaging Studies: DG Chest 2 View Result Date: 04/25/2024 EXAM: 2 VIEW(S) XRAY OF THE CHEST 04/25/2024 01:39:41 PM COMPARISON: 03/10/2024 CLINICAL HISTORY: SOB FINDINGS: LINES, TUBES AND DEVICES: A left subclavian stent is noted. LUNGS AND PLEURA: Mild pulmonary edema is present. Stable bilateral interstitial prominence is noted. No pleural effusion. No pneumothorax. HEART AND MEDIASTINUM: Stable cardiomediastinal silhouette with mild cardiomegaly. Aortic arch atherosclerosis is present. BONES AND SOFT TISSUES: Thoracic spondylosis is present. IMPRESSION: 1. Perihilar vascular congestion with diffuse interstitial prominence, consistent with pulmonary interstitial edema. 2. Stable cardiomediastinal silhouette with mild cardiomegaly and aortic arch atherosclerosis. Electronically signed by: Morene Hoard MD 04/25/2024 02:16 PM EST RP Workstation: [id number]    There are no new results to review at this time.  Previous records (including but not limited to H&P, progress notes, nursing notes, TOC management) were reviewed in assessment of this  patient.  Labs: CBC: Recent Labs  Lab 04/25/24 1438 04/26/24 0325 04/27/24 0741  WBC 9.3 7.8 11.8*  HGB 8.8* 8.0* 7.7*  HCT 31.2* 28.6* 26.8*  MCV 89.4 89.4 87.0  PLT 261 267 283   Basic Metabolic Panel: Recent Labs  Lab  04/25/24 1438 04/25/24 1813 04/26/24 0325 04/27/24 0741  NA 141  --  138 137  K 4.6  --  4.2 4.5  CL 103  --  102 101  CO2 27  --  24 27  GLUCOSE 122*  --  205* 119*  BUN 55*  --  62* 71*  CREATININE 2.51*  --  2.69* 2.69*  CALCIUM  9.6  --  8.9 9.3  MG  --  2.3  --  2.6*   Liver Function Tests: Recent Labs  Lab 04/27/24 0741  AST 15  ALT 12  ALKPHOS 56  BILITOT 0.3  PROT 6.0*  ALBUMIN  3.4*   CBG: Recent Labs  Lab 04/26/24 1153 04/26/24 1604 04/26/24 2032 04/27/24 0822 04/27/24 1123  GLUCAP 174* 160* 144* 114* 182*    Scheduled Meds:  aspirin  EC  81 mg Oral QHS   atorvastatin   40 mg Oral QHS   azithromycin   500 mg Oral QHS   bisoprolol   5 mg Oral Daily   budesonide  (PULMICORT ) nebulizer solution  0.5 mg Nebulization BID   bumetanide  (BUMEX ) IV  2 mg Intravenous Q12H   cholecalciferol   3,000 Units Oral Weekly   empagliflozin   10 mg Oral Daily   ezetimibe   10 mg Oral Daily   heparin   5,000 Units Subcutaneous Q8H   insulin  aspart  0-5 Units Subcutaneous QHS   insulin  aspart  0-9 Units Subcutaneous TID WC   levothyroxine   125 mcg Oral Q0600   nicotine   21 mg Transdermal Daily   pantoprazole   40 mg Oral BID   predniSONE   40 mg Oral Q breakfast   sodium chloride  flush  3 mL Intravenous Q12H   traZODone   100 mg Oral QHS   venlafaxine  XR  75 mg Oral Q breakfast   Continuous Infusions: PRN Meds:.acetaminophen  **OR** acetaminophen , guaiFENesin -dextromethorphan , ipratropium-albuterol , ondansetron  **OR** ondansetron  (ZOFRAN ) IV, senna-docusate  Family Communication: Not at bedside  Disposition: Status is: Inpatient Remains inpatient appropriate because: Acute on chronic hypoxic respiratory failure     Time spent: 39 minutes  Length of inpatient stay: 2 days  Author: Carliss LELON Canales, DO 04/27/2024 11:52 AM  For on call review [email or web address]   "

## 2024-04-27 NOTE — Evaluation (Addendum)
 Occupational Therapy Evaluation Patient Details Name: Misty Adams MRN: [id number] DOB: 03-30-1956 Today's Date: 04/27/2024   History of Present Illness   Patient is a 69 year old female with worsening shortness of breath, admitted for acute on chronic hypoxic respiratory failure, CHF exacerbation. PMH: HTN, hyperlipidemia, type 2 diabetes, hypothyroidism, CHF, chronic hypoxic respiratory failure secondary to COPD, pulmonary hypertension, ILD on chronic 2 L     Clinical Impressions Chart reviewed to date, pt greeted semi supine in bed, alert and oriented x4, agreeable to OT evaluation. PTA pt is generally MOD I-I in ADL/IADL, amb with no AD. Pt reports recent difficulties with bathing and LB dressing. Pt presents with deficits in activity tolerance and endurance on this date. She is performing ADL at a supervision-MOD I level but will benefit from acute OT to facilitate optimal ADL/functional mobility performance through ecs. She also reports B hand spasms recently (not localized, in both hands). OT will continue to assess. Pt is left as received,all needs met. OT will follow   Spo2 to 82% on 3L via [place] after amb, recovers to >90% after approx 1 minute.      If plan is discharge home, recommend the following:   Assistance with cooking/housework     Functional Status Assessment   Patient has had a recent decline in their functional status and demonstrates the ability to make significant improvements in function in a reasonable and predictable amount of time.     Equipment Recommendations   None recommended by OT     Recommendations for Other Services         Precautions/Restrictions   Precautions Precautions: Fall Recall of Precautions/Restrictions: Intact Restrictions Weight Bearing Restrictions Per Provider Order: No     Mobility Bed Mobility Overal bed mobility: Independent                  Transfers Overall transfer level: Modified independent                         Balance Overall balance assessment: Needs assistance Sitting-balance support: Feet supported Sitting balance-Leahy Scale: Normal     Standing balance support: No upper extremity supported Standing balance-Leahy Scale: Good                             ADL either performed or assessed with clinical judgement   ADL Overall ADL's : Needs assistance/impaired Eating/Feeding: Modified independent   Grooming: Supervision/safety           Upper Body Dressing : Supervision/safety Upper Body Dressing Details (indicate cue type and reason): donn/doff gown Lower Body Dressing: Supervision/safety;Sitting/lateral leans Lower Body Dressing Details (indicate cue type and reason): edu re energy conervation techniques; AD Toilet Transfer: Modified Independent;Supervision/safety;Ambulation Toilet Transfer Details (indicate cue type and reason): simulated Toileting- Clothing Manipulation and Hygiene: Supervision/safety;Sitting/lateral lean       Functional mobility during ADLs: Modified independent;Supervision/safety       Vision Patient Visual Report: No change from baseline       Perception         Praxis         Pertinent Vitals/Pain Pain Assessment Pain Assessment: No/denies pain     Extremity/Trunk Assessment Upper Extremity Assessment Upper Extremity Assessment: Overall WFL for tasks assessed;RUE deficits/detail;LUE deficits/detail;Right hand dominant RUE Deficits / Details: baseline tremor, pt reports it is good compared to other days. Pt also reports spasms throughout B hands (not  currently happening) where she has to put her fingers back into place LUE Deficits / Details: baseline tremor, pt reports it is good compared to other days. Pt also reports spasms throughout B hands (not currently happening) where she has to put her fingers back into place   Lower Extremity Assessment Lower Extremity Assessment: Overall WFL for  tasks assessed   Cervical / Trunk Assessment Cervical / Trunk Assessment: Kyphotic   Communication Communication Communication: No apparent difficulties   Cognition Arousal: Alert Behavior During Therapy: WFL for tasks assessed/performed Cognition: No apparent impairments                               Following commands: Intact       Cueing  General Comments   Cueing Techniques: Verbal cues  spo2 to 82% on 3L via [place] after amb   Exercises Other Exercises Other Exercises: edu re role of OT, role of rehab, discharge recommendations, energy conservation techniques   Shoulder Instructions      Home Living Family/patient expects to be discharged to:: Private residence Living Arrangements: Children Available Help at Discharge: Family Type of Home: House Home Access: Level entry     Home Layout: Two level;Able to live on main level with bedroom/bathroom     Bathroom Shower/Tub: Producer, Television/film/video: Standard     Home Equipment: Shower seat;Grab bars - tub/shower;Rolling Environmental Consultant (2 wheels)          Prior Functioning/Environment Prior Level of Function : Independent/Modified Independent             Mobility Comments: amb no AD ADLs Comments: MOD I-I in ADL/IADL, reports increased difficulties with showering and LB dressing due to SOB recently    OT Problem List: Decreased activity tolerance;Decreased knowledge of use of DME or AE   OT Treatment/Interventions: Self-care/ADL training;DME and/or AE instruction;Therapeutic activities;Balance training;Therapeutic exercise;Energy conservation;Patient/family education      OT Goals(Current goals can be found in the care plan section)   Acute Rehab OT Goals Patient Stated Goal: return to PLOF OT Goal Formulation: With patient Time For Goal Achievement: 05/11/24 Potential to Achieve Goals: Good ADL Goals Pt Will Perform Grooming: with modified independence;sitting;standing Pt Will  Perform Lower Body Dressing: with modified independence;sit to/from stand;sitting/lateral leans Pt Will Transfer to Toilet: with modified independence;ambulating Pt Will Perform Toileting - Clothing Manipulation and hygiene: with modified independence;sitting/lateral leans;sit to/from stand Pt/caregiver will Perform Home Exercise Program: Increased ROM;Both right and left upper extremity;With written HEP provided   OT Frequency:  Min 2X/week    Co-evaluation PT/OT/SLP Co-Evaluation/Treatment: Yes Reason for Co-Treatment:  (potential tolerance)          AM-PAC OT 6 Clicks Daily Activity     Outcome Measure Help from another person eating meals?: None Help from another person taking care of personal grooming?: None Help from another person toileting, which includes using toliet, bedpan, or urinal?: None Help from another person bathing (including washing, rinsing, drying)?: A Little Help from another person to put on and taking off regular upper body clothing?: None Help from another person to put on and taking off regular lower body clothing?: A Little 6 Click Score: 22   End of Session Equipment Utilized During Treatment: Oxygen  Nurse Communication: Mobility status  Activity Tolerance: Patient tolerated treatment well Patient left: in bed;with call bell/phone within reach  OT Visit Diagnosis: Other abnormalities of gait and mobility (R26.89)  Time: 9076-9058 OT Time Calculation (min): 18 min Charges:  OT General Charges $OT Visit: 1 Visit OT Evaluation $OT Eval Low Complexity: 1 Low Therisa Sheffield, OTD OTR/L  04/27/2024, 9:57 AM

## 2024-04-27 NOTE — Hospital Course (Addendum)
 69 y.o. year old female with medical history of hypertension, hyperlipidemia, type 2 diabetes, hypothyroidism, CHF (EF 60%, G2 DD and 02/2023), chronic hypoxic respiratory failure secondary to COPD, pulmonary hypertension, ILD on chronic 2 L presented to the ED with worsening shortness of breath.  Laboratory workup showed elevated proBNP, chest x-ray concerning for fluid overload admitted to hospitalist service for acute on chronic hypoxic respiratory failure, CHF exacerbation.    Assessment and Plan:   Acute on chronic hypoxic respiratory failure - Baseline 2 L nasal cannula, currently needing 3-4 L.  Continue to wean O2 as tolerated.   Acute exacerbation of chronic HFpEF - Responded quite well after transition to IV Bumex  2 mg every 12 hours.  Diuresed greater than 3 L since yesterday.  Continue current regimen.  Monitor urine output.  Wean O2 as tolerated   COPD exacerbation - Appears to be showing improvement.  Currently on oral prednisone , nebulizers.  Encouraged tobacco cessation.   Tobacco abuse - States she does not want to quit smoking, currently smoking 1-1/2 packs/day.  Continue to encourage cessation.   AKI on CKD 4 - Mild improvement in creatinine since yesterday but above baseline.  Likely cardiorenal etiology on top of CKD.  Monitor urine output and recheck BMP in AM.   Diabetes mellitus - Insulin  sliding scale on board.   Chronic anemia - No active bleeding appreciated.  Hemoglobin stable.  Will recheck CBC in AM.

## 2024-04-27 NOTE — TOC Initial Note (Signed)
 Transition of Care [facility]) - Initial/Assessment Note    Patient Details  Name: Misty Adams MRN: [id number] Date of Birth: 09-15-1955  [facility]) CM/SW Contact:    Shasta DELENA Daring, RN Phone Number: 04/27/2024, 5:03 PM  Clinical Narrative:                 Readmission risk assessment complete.  Patient lives in [place] with adult children. One of them drives her to appts. [facility] [address] [facility]. No problems affording meds except for [place]. Will notify Pharmacy. Uses o2 at home through [employer]. 2L at baseline.  Has DME including walker, rollator, cane and shower bench.   No TOC needs identified. Please place consult should needs arise.   Expected Discharge Plan: Home/Self Care Barriers to Discharge: Continued Medical Work up   Patient Goals and CMS Choice            Expected Discharge Plan and Services   Discharge Planning Services: CM Consult   Living arrangements for the past 2 months: Single Family Home                   DME Agency: [employer]                  Prior Living Arrangements/Services Living arrangements for the past 2 months: Single Family Home Lives with:: Adult Children Patient language and need for interpreter reviewed:: Yes Do you feel safe going back to the place where you live?: Yes      Need for Family Participation in Patient Care: Yes (Comment) Care giver support system in place?: Yes (comment) Current home services: DME, Other (comment) (Home O2 with [employer]) Criminal Activity/Legal Involvement Pertinent to Current Situation/Hospitalization: No - Comment as needed  Activities of Daily Living   ADL Screening (condition at time of admission) Independently performs ADLs?: Yes (appropriate for developmental age) Is the patient deaf or have difficulty hearing?: No Does the patient have difficulty seeing, even when wearing glasses/contacts?: No Does the patient have difficulty concentrating, remembering, or making  decisions?: No  Permission Sought/Granted                  Emotional Assessment Appearance:: Appears stated age Attitude/Demeanor/Rapport: Gracious, Engaged Affect (typically observed): Appropriate Orientation: : Oriented to Self, Oriented to Place, Oriented to  Time, Oriented to Situation Alcohol / Substance Use: Not Applicable Psych Involvement: No (comment)  Admission diagnosis:  Shortness of breath [R06.02] Hypoxia [R09.02] COPD exacerbation (HCC) [J44.1] Acute on chronic diastolic congestive heart failure (HCC) [I50.33] Acute on chronic hypoxic respiratory failure (HCC) [J96.21] Patient Active Problem List   Diagnosis Date Noted   Acute on chronic hypoxic respiratory failure (HCC) 04/25/2024   Symptomatic anemia 03/10/2024   Tobacco use disorder 10/19/2023   Acute exacerbation of CHF (congestive heart failure) (HCC) 10/19/2023   Iron  deficiency anemia secondary to blood loss (chronic) 07/07/2023   Hypotension due to medication 07/06/2023   Dependence on nocturnal oxygen  therapy 07/06/2023   Syncope and collapse 07/06/2023   Fall at home, initial encounter 07/06/2023   Demand ischemia (HCC) 03/26/2023   CAD in native artery 03/26/2023   Pulmonary hypertension, unspecified (HCC) 03/25/2023   Acute on chronic respiratory failure with hypoxemia (HCC) 03/24/2023   CKD (chronic kidney disease), stage IV (HCC) 03/24/2023   CHF (congestive heart failure) (HCC) 03/23/2023   Mild aortic stenosis 03/15/2023   Acute hypokalemia 03/10/2023   Acute respiratory failure with hypoxia (HCC) 03/09/2023   Chronic  heart failure with preserved ejection fraction (HFpEF) (HCC) 03/09/2023   Dyslipidemia 03/09/2023   GERD without esophagitis 03/09/2023   Acute kidney injury superimposed on chronic kidney disease 03/09/2023   Pulmonary nodule 01/21/2023   Acute on chronic anemia 12/08/2022   Severe sepsis (HCC) 07/16/2022   Acute pyelonephritis 07/16/2022   Lactic acidosis 07/16/2022    Thrombocytopenia 07/16/2022   Essential hypertension 07/16/2022   Hyperlipidemia, unspecified 07/16/2022   Hypothyroidism 07/16/2022   Anxiety and depression 07/16/2022   Acute renal failure with acute tubular necrosis superimposed on stage 3b chronic kidney disease (HCC) 07/14/2022   Chronic obstructive pulmonary disease (HCC) 07/14/2022   Major depressive disorder, recurrent, 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   Pneumonia 09/21/2020   Pneumonia due to COVID-19 virus 09/20/2020   Aortic atherosclerosis 03/13/2020   Vitamin D  deficiency 02/04/2019   Medicare annual wellness visit, initial 10/19/2018   H/O herpes zoster 09/08/2018   B12 deficiency 01/22/2018   Tubular adenoma 11/16/2017   Chronic venous insufficiency 09/16/2017   Diabetes mellitus with peripheral vascular disease (HCC) 07/21/2017   PAD (peripheral artery disease) 07/21/2017   Subclavian artery stenosis, left 07/15/2017   Psoriasis 08/19/2013   PCP:  Cleotilde Oneil FALCON, MD Pharmacy:   [phone] [address] [place] [place] - [address] [place] [address] Phone: [phone] Fax: [phone]  [facility] [address] [place] [address] Phone: [phone] Fax: [phone]  [facility] - [place], [place] - [address] [place] [address] Phone: [phone] Fax: [phone]  [place] [facility] [address] [place] [address] Phone: [phone] Fax: [phone]     Social Drivers of Health (SDOH) Social History: SDOH Screenings   Food Insecurity: No Food Insecurity (04/26/2024)  Housing: Low Risk (04/26/2024)  Transportation Needs: No Transportation Needs (04/26/2024)  Utilities: Not At Risk (04/26/2024)  Alcohol Screen: Low Risk (03/24/2023)  Depression (PHQ2-9): Low Risk (04/18/2024)  Financial Resource Strain: High Risk (05/07/2023)   Received from [facility]  Social  Connections: Socially Isolated (04/26/2024)  Tobacco Use: High Risk (04/19/2024)   SDOH Interventions:     Readmission Risk Interventions    04/27/2024    4:59 PM 10/21/2023   12:03 PM 07/07/2023    8:10 PM  Readmission Risk Prevention Plan  Transportation Screening Complete Complete Complete  PCP or Specialist Appt within 3-5 Days Complete Complete Complete  HRI or Home Care Consult Complete  Complete  Social Work Consult for Recovery Care Planning/Counseling Complete Complete Complete  Palliative Care Screening Not Applicable Not Applicable Not Applicable  Medication Review Oceanographer) Complete Complete Complete

## 2024-04-27 NOTE — Progress Notes (Signed)
 Heart Failure Stewardship Pharmacy Note  PCP: Cleotilde Oneil FALCON, MD PCP-Cardiologist: [name], MD  HPI: Misty Adams is a 69 y.o. female with hypertension, hyperlipidemia, type 2 diabetes, hypothyroidism, CHF, peripheral vascular disease s/p left subclavian stent placement  chronic hypoxic respiratory failure secondary to COPD, pulmonary hypertension, ILD on chronic 2 L who presented with progressive shortness of breath. On admission, BNP was 3355, HS-troponin was 50, and LFTs WNL. Chest x-ray noted perihilar vascular congestion with diffuse interstitial prominence, consistent with pulmonary interstitial edema.   Pertinent cardiac history: Echo in 02/2023 showed LVEF of 60-65%, grade II diastolic dysfunction, mild AS. HS-troponin was 8 on admission. CXR this admission noted interstitial pulmonary edema with small right pleural effusion. After recent admission in November, the patient was taking oral furosemide  and was found to be volume up at her PCP appointment. Torsemide  was started, but patient reports that this did not significantly increase diuresis. Underwent cath on 03/26/23 where a 90% stenosis of mid RCA was identified as the culprit lesion and treated with DES. RHC showed PCWP of 24 and PAP of 49/24 (33), CO/CI of 5.76/3.13, RAP of 14.   Pertinent Lab Values: Creatinine  Date Value Ref Range Status  02/04/2024 2.04 (H) 0.44 - 1.00 mg/dL Final  93/89/7984 9.38 0.60 - 1.30 mg/dL Final   Creatinine, Ser  Date Value Ref Range Status  04/26/2024 2.69 (H) 0.44 - 1.00 mg/dL Final   BUN  Date Value Ref Range Status  04/26/2024 62 (H) 8 - 23 mg/dL Final  87/69/7974 40 (H) 8 - 27 mg/dL Final  93/89/7984 15 7 - 18 mg/dL Final   Potassium  Date Value Ref Range Status  04/26/2024 4.2 3.5 - 5.1 mmol/L Final  09/28/2013 3.9 3.5 - 5.1 mmol/L Final   Sodium  Date Value Ref Range Status  04/26/2024 138 135 - 145 mmol/L Final  04/19/2024 141 134 - 144 mmol/L Final  09/28/2013 133  (L) 136 - 145 mmol/L Final   B Natriuretic Peptide  Date Value Ref Range Status  02/25/2024 651.3 (H) 0.0 - 100.0 pg/mL Final    Comment:    Performed at [facility], [address] [place], [place] [address]   Magnesium   Date Value Ref Range Status  04/25/2024 2.3 1.7 - 2.4 mg/dL Final    Comment:    Performed at [facility], [address] [place], [place] [address]   Hgb A1c MFr Bld  Date Value Ref Range Status  07/15/2022 7.3 (H) 4.8 - 5.6 % Final    Comment:    (NOTE)         Prediabetes: 5.7 - 6.4         Diabetes: >6.4         Glycemic control for adults with diabetes: <7.0    LDH  Date Value Ref Range Status  07/16/2023 103 98 - 192 U/L Final    Comment:    Performed at [facility], [address] [place], [place] [address]    Vital Signs:  Temp:  [97.6 F (36.4 C)-98 F (36.7 C)] 97.6 F (36.4 C) (01/07 0417) Pulse Rate:  [70-86] 70 (01/07 0417) Cardiac Rhythm: Normal sinus rhythm (01/06 1901) Resp:  [16-20] 18 (01/07 0417) BP: (132-153)/(51-95) 153/66 (01/07 0417) SpO2:  [85 %-97 %] 93 % (01/07 0417) Weight:  [84.1 kg (185 lb 6.5 oz)] 84.1 kg (185 lb 6.5 oz) (01/07 0536)  Intake/Output Summary (Last 24 hours) at 04/27/2024 0737 Last data filed  at 04/27/2024 0400 Gross per 24 hour  Intake 1080 ml  Output 350 ml  Net 730 ml    Current Heart Failure Medications:  Loop diuretic: bumetanide  2 mg IV q12h Beta-Blocker: bisoprolol  5 mg daily ACEI/ARB/ARNI: none MRA: none SGLT2i: Jardiance  10 mg daily Other: none  Prior to admission Heart Failure Medications:  Loop diuretic: torsemide  60 mg daily + metolazone  2.5 mg prn + Furoscix  prn Beta-Blocker: bisoprolol  5 mg daily ACEI/ARB/ARNI: none MRA: spironolactone  25 mg daily SGLT2i: Jardiance  10 mg daily Other: none  Assessment: 1. Acute on chronic diastolic heart failure (LVEF 60-65%) with grade II diastolic dysfunction. NYHA class III symptoms.  -Symptoms: Reports  shortness of breath and orthopnea are improved from admission, though still present. Never had LEE. Appetite was mildly reduced on admission and remains so. -Volume: Likely still hypervolemic. Incomplete I/Os and suspect daily weights have been inaccurate. Patient reports good urine output, though current bumetanide  2 mg q12h is lower than home torsemide  equivalent. If clinically suspected to still be volume overload, the patient may benefit from increasing to bumetanide  4 mg IV BID. -Hemodynamics: BP is elevated. HR 60s.  -SGLT2i: Continue Jardiance  10 mg daily. -MRA: Given elevated creatinine and low eGFR, patient will likely require stopping spironolactone  at discharge. She may be a candidate for Leonore if eGFR improves some. -ARNI: Renal function is too poor to add RAASi.  Plan: 1) Medication changes recommended at this time: - None at this time  2) Patient assistance: -Pending  3) Education: - Patient has been educated on current HF medications and potential additions to HF medication regimen - Patient verbalizes understanding that over the next few months, these medication doses may change and more medications may be added to optimize HF regimen - Patient has been educated on basic disease state pathophysiology and goals of therapy  Please do not hesitate to reach out with questions or concerns,  Davionne Mastrangelo, PharmD, CPP, BCPS, [facility] Heart Failure Pharmacist  Phone - [phone] 04/27/2024 8:32 AM

## 2024-04-27 NOTE — Evaluation (Signed)
 Physical Therapy Evaluation and Discharge  Patient Details Name: Misty Adams MRN: [id number] DOB: [birth year] Today's Date: 04/27/2024  History of Present Illness  Patient is a 69 year old female with worsening shortness of breath, admitted for acute on chronic hypoxic respiratory failure, CHF exacerbation. PMH: HTN, hyperlipidemia, type 2 diabetes, hypothyroidism, CHF, chronic hypoxic respiratory failure secondary to COPD, pulmonary hypertension, ILD on chronic 2 L   Clinical Impression  Patient is agreeable to PT evaluation. She is known to this therapist from prior admission. The patient is independent with mobility at home on oxygen .  Today the patient is likely close to her baseline level of functional independence. She has noticed an improvement in respiratory status but not feeling back to baseline, currently on 3 L02 rather than 2 L02.  Discussed energy conservation strategies. Sp02 82-85% after walking on 3 L02 but increases to 90-91% after rest break. As patient is close to baseline, no acute PT needs anticipated at this time. PT will sign off.       If plan is discharge home, recommend the following: Assist for transportation   Can travel by private vehicle        Equipment Recommendations None recommended by PT  Recommendations for Other Services       Functional Status Assessment Patient has not had a recent decline in their functional status     Precautions / Restrictions Precautions Precautions: Fall Recall of Precautions/Restrictions: Intact Restrictions Weight Bearing Restrictions Per Provider Order: No      Mobility  Bed Mobility Overal bed mobility: Independent                  Transfers Overall transfer level: Modified independent                      Ambulation/Gait Ambulation/Gait assistance: Modified independent (Device/Increase time) Gait Distance (Feet): 100 Feet Assistive device: None Gait Pattern/deviations: Step-through  pattern Gait velocity: decreased     General Gait Details: no loss of balance with hallway ambulation. Sp02 initially below 85% on 3 L after walking but increased to 90-91% after  sitting and breathing techniques. encouraged energy conservation strategies to use in home setting  Stairs            Wheelchair Mobility     Tilt Bed    Modified Rankin (Stroke Patients Only)       Balance Overall balance assessment: Needs assistance Sitting-balance support: Feet supported Sitting balance-Leahy Scale: Normal     Standing balance support: No upper extremity supported Standing balance-Leahy Scale: Good                               Pertinent Vitals/Pain Pain Assessment Pain Assessment: No/denies pain    Home Living Family/patient expects to be discharged to:: Private residence Living Arrangements: Children Available Help at Discharge: Family Type of Home: House Home Access: Level entry       Home Layout: Two level;Able to live on main level with bedroom/bathroom Home Equipment: Shower seat;Grab bars - tub/shower;Rolling Walker (2 wheels)      Prior Function Prior Level of Function : Independent/Modified Independent             Mobility Comments: amb no AD ADLs Comments: MOD I-I in ADL/IADL, reports increased difficulties with showering and LB dressing due to SOB recently     Extremity/Trunk Assessment   Upper Extremity Assessment Upper Extremity Assessment: Overall  WFL for tasks assessed RUE Deficits / Details: baseline tremor, pt reports it is good compared to other days. Pt also reports spasms throughout B hands (not currently happening) where she has to put her fingers back into place LUE Deficits / Details: baseline tremor, pt reports it is good compared to other days. Pt also reports spasms throughout B hands (not currently happening) where she has to put her fingers back into place    Lower Extremity Assessment Lower Extremity  Assessment: Overall WFL for tasks assessed    Cervical / Trunk Assessment Cervical / Trunk Assessment: Kyphotic  Communication   Communication Communication: No apparent difficulties    Cognition Arousal: Alert Behavior During Therapy: WFL for tasks assessed/performed                             Following commands: Intact       Cueing Cueing Techniques: Verbal cues     General Comments General comments (skin integrity, edema, etc.): spo2 to 82% on 3L via  after amb    Exercises     Assessment/Plan    PT Assessment Patient does not need any further PT services  PT Problem List         PT Treatment Interventions      PT Goals (Current goals can be found in the Care Plan section)  Acute Rehab PT Goals PT Goal Formulation: All assessment and education complete, DC therapy    Frequency       Co-evaluation   Reason for Co-Treatment:  (tolerance)           AM-PAC PT 6 Clicks Mobility  Outcome Measure Help needed turning from your back to your side while in a flat bed without using bedrails?: None Help needed moving from lying on your back to sitting on the side of a flat bed without using bedrails?: None Help needed moving to and from a bed to a chair (including a wheelchair)?: None Help needed standing up from a chair using your arms (e.g., wheelchair or bedside chair)?: None Help needed to walk in hospital room?: None Help needed climbing 3-5 steps with a railing? : None 6 Click Score: 24    End of Session Equipment Utilized During Treatment: Oxygen  Activity Tolerance: Patient tolerated treatment well Patient left: in bed;with call bell/phone within reach Nurse Communication: Mobility status PT Visit Diagnosis: Muscle weakness (generalized) (M62.81)    Time: 9074-9058 PT Time Calculation (min) (ACUTE ONLY): 16 min   Charges:   PT Evaluation $PT Eval Low Complexity: 1 Low   PT General Charges $$ ACUTE PT VISIT: 1 Visit          Randine Essex, PT, MPT   Randine LULLA Essex 04/27/2024, 12:55 PM

## 2024-04-27 NOTE — Plan of Care (Signed)

## 2024-04-28 DIAGNOSIS — E1151 Type 2 diabetes mellitus with diabetic peripheral angiopathy without gangrene: Secondary | ICD-10-CM

## 2024-04-28 DIAGNOSIS — J9621 Acute and chronic respiratory failure with hypoxia: Secondary | ICD-10-CM | POA: Diagnosis not present

## 2024-04-28 DIAGNOSIS — J432 Centrilobular emphysema: Secondary | ICD-10-CM | POA: Diagnosis not present

## 2024-04-28 DIAGNOSIS — I5033 Acute on chronic diastolic (congestive) heart failure: Secondary | ICD-10-CM

## 2024-04-28 LAB — CBC
HCT: 26.9 % — ABNORMAL LOW (ref 36.0–46.0)
Hemoglobin: 7.7 g/dL — ABNORMAL LOW (ref 12.0–15.0)
MCH: 25.2 pg — ABNORMAL LOW (ref 26.0–34.0)
MCHC: 28.6 g/dL — ABNORMAL LOW (ref 30.0–36.0)
MCV: 88.2 fL (ref 80.0–100.0)
Platelets: 282 K/uL (ref 150–400)
RBC: 3.05 MIL/uL — ABNORMAL LOW (ref 3.87–5.11)
RDW: 22.6 % — ABNORMAL HIGH (ref 11.5–15.5)
WBC: 9.8 K/uL (ref 4.0–10.5)
nRBC: 0.2 % (ref 0.0–0.2)

## 2024-04-28 LAB — GLUCOSE, CAPILLARY
Glucose-Capillary: 97 mg/dL (ref 70–99)
Glucose-Capillary: 107 mg/dL — ABNORMAL HIGH (ref 70–99)
Glucose-Capillary: 148 mg/dL — ABNORMAL HIGH (ref 70–99)
Glucose-Capillary: 90 mg/dL (ref 70–99)

## 2024-04-28 LAB — BASIC METABOLIC PANEL WITH GFR
Anion gap: 10 (ref 5–15)
BUN: 72 mg/dL — ABNORMAL HIGH (ref 8–23)
CO2: 28 mmol/L (ref 22–32)
Calcium: 8.9 mg/dL (ref 8.9–10.3)
Chloride: 103 mmol/L (ref 98–111)
Creatinine, Ser: 2.36 mg/dL — ABNORMAL HIGH (ref 0.44–1.00)
GFR, Estimated: 22 mL/min — ABNORMAL LOW
Glucose, Bld: 94 mg/dL (ref 70–99)
Potassium: 3.7 mmol/L (ref 3.5–5.1)
Sodium: 141 mmol/L (ref 135–145)

## 2024-04-28 LAB — MAGNESIUM: Magnesium: 2.5 mg/dL — ABNORMAL HIGH (ref 1.7–2.4)

## 2024-04-28 MED ORDER — MAGNESIUM SULFATE 2 GM/50ML IV SOLN: 2.0000 g | Freq: Once | INTRAVENOUS | Status: DC

## 2024-04-28 NOTE — Care Management Important Message (Signed)
 Important Message  Patient Details  Name: Misty Adams MRN: [id number] Date of Birth: 01/08/1956   Important Message Given:  Yes - Medicare IM     Valita Righter W, CMA 04/28/2024, 12:48 PM

## 2024-04-28 NOTE — Progress Notes (Signed)
 Heart Failure Stewardship Pharmacy Note  PCP: Cleotilde Oneil FALCON, MD PCP-Cardiologist: [name], MD  HPI: Misty Adams is a 69 y.o. female with hypertension, hyperlipidemia, type 2 diabetes, hypothyroidism, CHF, peripheral vascular disease s/p left subclavian stent placement  chronic hypoxic respiratory failure secondary to COPD, pulmonary hypertension, ILD on chronic 2 L who presented with progressive shortness of breath. On admission, BNP was 3355, HS-troponin was 50, and LFTs WNL. Chest x-ray noted perihilar vascular congestion with diffuse interstitial prominence, consistent with pulmonary interstitial edema.   Pertinent cardiac history: Echo in 02/2023 showed LVEF of 60-65%, grade II diastolic dysfunction, mild AS. HS-troponin was 8 on admission. CXR this admission noted interstitial pulmonary edema with small right pleural effusion. After recent admission in November, the patient was taking oral furosemide  and was found to be volume up at her PCP appointment. Torsemide  was started, but patient reports that this did not significantly increase diuresis. Underwent cath on 03/26/23 where a 90% stenosis of mid RCA was identified as the culprit lesion and treated with DES. RHC showed PCWP of 24 and PAP of 49/24 (33), CO/CI of 5.76/3.13, RAP of 14.   Pertinent Lab Values: Creatinine  Date Value Ref Range Status  02/04/2024 2.04 (H) 0.44 - 1.00 mg/dL Final  93/89/7984 9.38 0.60 - 1.30 mg/dL Final   Creatinine, Ser  Date Value Ref Range Status  04/28/2024 2.36 (H) 0.44 - 1.00 mg/dL Final   BUN  Date Value Ref Range Status  04/28/2024 72 (H) 8 - 23 mg/dL Final  87/69/7974 40 (H) 8 - 27 mg/dL Final  93/89/7984 15 7 - 18 mg/dL Final   Potassium  Date Value Ref Range Status  04/28/2024 3.7 3.5 - 5.1 mmol/L Final  09/28/2013 3.9 3.5 - 5.1 mmol/L Final   Sodium  Date Value Ref Range Status  04/28/2024 141 135 - 145 mmol/L Final  04/19/2024 141 134 - 144 mmol/L Final  09/28/2013 133  (L) 136 - 145 mmol/L Final   B Natriuretic Peptide  Date Value Ref Range Status  02/25/2024 651.3 (H) 0.0 - 100.0 pg/mL Final    Comment:    Performed at [facility], [address] [place], [place] [address]   Magnesium   Date Value Ref Range Status  04/28/2024 2.5 (H) 1.7 - 2.4 mg/dL Final    Comment:    Performed at [facility], [address] [place], [place] [address]   Hgb A1c MFr Bld  Date Value Ref Range Status  07/15/2022 7.3 (H) 4.8 - 5.6 % Final    Comment:    (NOTE)         Prediabetes: 5.7 - 6.4         Diabetes: >6.4         Glycemic control for adults with diabetes: <7.0    LDH  Date Value Ref Range Status  07/16/2023 103 98 - 192 U/L Final    Comment:    Performed at [facility], [address] [place], [place] [address]    Vital Signs:  Temp:  [97.5 F (36.4 C)-98.1 F (36.7 C)] 97.7 F (36.5 C) (01/08 0344) Pulse Rate:  [64-73] 64 (01/08 0344) Cardiac Rhythm: Normal sinus rhythm (01/07 1908) Resp:  [20] 20 (01/08 0344) BP: (147-165)/(48-65) 158/58 (01/08 0344) SpO2:  [88 %-96 %] 93 % (01/08 0344) FiO2 (%):  [28 %] 28 % (01/07 0834) Weight:  [82.6 kg (182 lb 1.6 oz)] 82.6 kg (182 lb 1.6 oz) (01/08 0500)  Intake/Output  Summary (Last 24 hours) at 04/28/2024 0800 Last data filed at 04/28/2024 0600 Gross per 24 hour  Intake 240 ml  Output 3850 ml  Net -3610 ml    Current Heart Failure Medications:  Loop diuretic: bumetanide  2 mg IV q12h Beta-Blocker: bisoprolol  5 mg daily ACEI/ARB/ARNI: none MRA: none SGLT2i: Jardiance  10 mg daily Other: none  Prior to admission Heart Failure Medications:  Loop diuretic: torsemide  60 mg daily + metolazone  2.5 mg prn + Furoscix  prn Beta-Blocker: bisoprolol  5 mg daily ACEI/ARB/ARNI: none MRA: spironolactone  25 mg daily SGLT2i: Jardiance  10 mg daily Other: none  Assessment: 1. Acute on chronic diastolic heart failure (LVEF 60-65%) with grade II diastolic dysfunction. NYHA class  III symptoms.  -Symptoms: Reports shortness of breath and orthopnea continue to improve. Never had LEE. Appetite was mildly reduced on admission, now fair. -Volume: Likely still hypervolemic. Excellent urine output, though daily weights have been inaccurate. Creatinine is improving on bumetanide  2 mg q12h, though this is lower than home torsemide  equivalent.  Continue current dose -Hemodynamics: BP is elevated. HR 60s.  -SGLT2i: Continue Jardiance  10 mg daily. -MRA: Given elevated creatinine and low eGFR, patient will likely require stopping spironolactone  at discharge. She may be a candidate for Leonore if eGFR improves some. -ARNI: Renal function is too poor to add RAASi. - Patient would benefit from afterload reduction. Though there is no benefit in diastolic CHF, would consider adding amlodipine  or hydralazine  for hypertension.  Plan: 1) Medication changes recommended at this time: - Consider adding amlodipine  vs hydralazine  for BP control.  2) Patient assistance: -Pending  3) Education: - Patient has been educated on current HF medications and potential additions to HF medication regimen - Patient verbalizes understanding that over the next few months, these medication doses may change and more medications may be added to optimize HF regimen - Patient has been educated on basic disease state pathophysiology and goals of therapy  Please do not hesitate to reach out with questions or concerns,  Marcello Tuzzolino, PharmD, CPP, BCPS, BCCP Heart Failure Pharmacist  Phone - [phone] 04/28/2024 8:00 AM

## 2024-04-28 NOTE — Plan of Care (Signed)

## 2024-04-28 NOTE — Progress Notes (Signed)
 " Progress Note   Patient: Misty Adams FMW:[id number] DOB: [birth year] DOA: 04/25/2024  DOS: the patient was seen and examined on 04/28/2024   Brief hospital course:  69 y.o. year old female with medical history of hypertension, hyperlipidemia, type 2 diabetes, hypothyroidism, CHF (EF 60%, G2 DD and 02/2023), chronic hypoxic respiratory failure secondary to COPD, pulmonary hypertension, ILD on chronic 2 L presented to the ED with worsening shortness of breath.  Laboratory workup showed elevated proBNP, chest x-ray concerning for fluid overload admitted to hospitalist service for acute on chronic hypoxic respiratory failure, CHF exacerbation.    Assessment and Plan:   Acute on chronic hypoxic respiratory failure - Baseline 2 L nasal cannula, currently needing 3-4 L.  Continue to wean O2 as tolerated.   Acute exacerbation of chronic HFpEF - Responded quite well after transition to IV Bumex  2 mg every 12 hours.  Diuresed greater than 3 L since yesterday.  Continue current regimen.  Monitor urine output.  Wean O2 as tolerated   COPD exacerbation - Appears to be showing improvement.  Currently on oral prednisone , nebulizers.  Encouraged tobacco cessation.   Tobacco abuse - States she does not want to quit smoking, currently smoking 1-1/2 packs/day.  Continue to encourage cessation.   AKI on CKD 4 - Mild improvement in creatinine since yesterday but above baseline.  Likely cardiorenal etiology on top of CKD.  Monitor urine output and recheck BMP in AM.   Diabetes mellitus - Insulin  sliding scale on board.   Chronic anemia - No active bleeding appreciated.  Hemoglobin stable.  Will recheck CBC in AM.   Subjective: Patient feeling improved this morning.  Responded quite well to diuresis.  Still having shortness of breath on exertion and still requiring 4 L nasal cannula.  States she is not quite ready to go home yet.  Denies any fever, purulent sputum, chest pain, nausea, vomiting, abdominal  pain.  Physical Exam:  Vitals:   04/28/24 0500 04/28/24 0821 04/28/24 0841 04/28/24 1250  BP:   (!) 162/58 (!) 165/80  Pulse:   63 60  Resp:   16 20  Temp:   97.6 F (36.4 C) (!) 97.5 F (36.4 C)  TempSrc:      SpO2:  92% 93% 93%  Weight: 82.6 kg       GENERAL:  Alert, pleasant, no acute distress  HEENT:  EOMI, nasal cannula CARDIOVASCULAR:  RRR, no murmurs appreciated RESPIRATORY: Poor air movement bilaterally GASTROINTESTINAL:  Soft, nontender, nondistended EXTREMITIES:  No LE edema bilaterally NEURO:  No new focal deficits appreciated SKIN:  No rashes noted PSYCH:  Appropriate mood and affect    Data Reviewed:  Imaging Studies: DG Chest 2 View Result Date: 04/25/2024 EXAM: 2 VIEW(S) XRAY OF THE CHEST 04/25/2024 01:39:41 PM COMPARISON: 03/10/2024 CLINICAL HISTORY: SOB FINDINGS: LINES, TUBES AND DEVICES: A left subclavian stent is noted. LUNGS AND PLEURA: Mild pulmonary edema is present. Stable bilateral interstitial prominence is noted. No pleural effusion. No pneumothorax. HEART AND MEDIASTINUM: Stable cardiomediastinal silhouette with mild cardiomegaly. Aortic arch atherosclerosis is present. BONES AND SOFT TISSUES: Thoracic spondylosis is present. IMPRESSION: 1. Perihilar vascular congestion with diffuse interstitial prominence, consistent with pulmonary interstitial edema. 2. Stable cardiomediastinal silhouette with mild cardiomegaly and aortic arch atherosclerosis. Electronically signed by: Morene Hoard MD 04/25/2024 02:16 PM EST RP Workstation: [id number]    There are no new results to review at this time.  Previous records (including but not limited to H&P, progress notes, nursing notes, TOC management)  were reviewed in assessment of this patient.  Labs: CBC: Recent Labs  Lab 04/25/24 1438 04/26/24 0325 04/27/24 0741 04/28/24 0425  WBC 9.3 7.8 11.8* 9.8  HGB 8.8* 8.0* 7.7* 7.7*  HCT 31.2* 28.6* 26.8* 26.9*  MCV 89.4 89.4 87.0 88.2  PLT 261 267 283 282    Basic Metabolic Panel: Recent Labs  Lab 04/25/24 1438 04/25/24 1813 04/26/24 0325 04/27/24 0741 04/28/24 0425  NA 141  --  138 137 141  K 4.6  --  4.2 4.5 3.7  CL 103  --  102 101 103  CO2 27  --  24 27 28   GLUCOSE 122*  --  205* 119* 94  BUN 55*  --  62* 71* 72*  CREATININE 2.51*  --  2.69* 2.69* 2.36*  CALCIUM  9.6  --  8.9 9.3 8.9  MG  --  2.3  --  2.6* 2.5*   Liver Function Tests: Recent Labs  Lab 04/27/24 0741  AST 15  ALT 12  ALKPHOS 56  BILITOT 0.3  PROT 6.0*  ALBUMIN  3.4*   CBG: Recent Labs  Lab 04/27/24 1123 04/27/24 1550 04/27/24 2055 04/28/24 0841 04/28/24 1246  GLUCAP 182* 146* 193* 97 107*    Scheduled Meds:  aspirin  EC  81 mg Oral QHS   atorvastatin   40 mg Oral QHS   bisoprolol   5 mg Oral Daily   budesonide  (PULMICORT ) nebulizer solution  0.5 mg Nebulization BID   bumetanide  (BUMEX ) IV  2 mg Intravenous Q12H   cholecalciferol   3,000 Units Oral Weekly   empagliflozin   10 mg Oral Daily   ezetimibe   10 mg Oral Daily   gabapentin   200 mg Oral QHS   heparin   5,000 Units Subcutaneous Q8H   insulin  aspart  0-5 Units Subcutaneous QHS   insulin  aspart  0-9 Units Subcutaneous TID WC   levothyroxine   125 mcg Oral Q0600   nicotine   21 mg Transdermal Daily   pantoprazole   40 mg Oral BID   predniSONE   40 mg Oral Q breakfast   sodium chloride  flush  3 mL Intravenous Q12H   traZODone   100 mg Oral QHS   venlafaxine  XR  75 mg Oral Q breakfast   Continuous Infusions: PRN Meds:.acetaminophen  **OR** acetaminophen , guaiFENesin -dextromethorphan , ipratropium-albuterol , ondansetron  **OR** ondansetron  (ZOFRAN ) IV, senna-docusate  Family Communication: None at bedside  Disposition: Status is: Inpatient Remains inpatient appropriate because: Acute on chronic hypoxic respiratory failure     Time spent: 36 minutes  Length of inpatient stay: 3 days  Author: Carliss LELON Canales, DO 04/28/2024 12:57 PM  For on call review [email or web address].   "

## 2024-04-29 ENCOUNTER — Other Ambulatory Visit: Payer: Self-pay

## 2024-04-29 ENCOUNTER — Encounter: Payer: Self-pay | Admitting: Internal Medicine

## 2024-04-29 DIAGNOSIS — N184 Chronic kidney disease, stage 4 (severe): Secondary | ICD-10-CM

## 2024-04-29 DIAGNOSIS — I5032 Chronic diastolic (congestive) heart failure: Secondary | ICD-10-CM | POA: Diagnosis not present

## 2024-04-29 DIAGNOSIS — F17200 Nicotine dependence, unspecified, uncomplicated: Secondary | ICD-10-CM | POA: Diagnosis not present

## 2024-04-29 DIAGNOSIS — J9621 Acute and chronic respiratory failure with hypoxia: Secondary | ICD-10-CM | POA: Diagnosis not present

## 2024-04-29 DIAGNOSIS — J432 Centrilobular emphysema: Secondary | ICD-10-CM | POA: Diagnosis not present

## 2024-04-29 LAB — BASIC METABOLIC PANEL WITH GFR
Anion gap: 10 (ref 5–15)
BUN: 68 mg/dL — ABNORMAL HIGH (ref 8–23)
CO2: 29 mmol/L (ref 22–32)
Calcium: 8.9 mg/dL (ref 8.9–10.3)
Chloride: 103 mmol/L (ref 98–111)
Creatinine, Ser: 2.3 mg/dL — ABNORMAL HIGH (ref 0.44–1.00)
GFR, Estimated: 22 mL/min — ABNORMAL LOW
Glucose, Bld: 158 mg/dL — ABNORMAL HIGH (ref 70–99)
Potassium: 3.6 mmol/L (ref 3.5–5.1)
Sodium: 142 mmol/L (ref 135–145)

## 2024-04-29 LAB — CBC
HCT: 28.4 % — ABNORMAL LOW (ref 36.0–46.0)
Hemoglobin: 8.1 g/dL — ABNORMAL LOW (ref 12.0–15.0)
MCH: 25.2 pg — ABNORMAL LOW (ref 26.0–34.0)
MCHC: 28.5 g/dL — ABNORMAL LOW (ref 30.0–36.0)
MCV: 88.2 fL (ref 80.0–100.0)
Platelets: 276 K/uL (ref 150–400)
RBC: 3.22 MIL/uL — ABNORMAL LOW (ref 3.87–5.11)
RDW: 22.5 % — ABNORMAL HIGH (ref 11.5–15.5)
WBC: 7.7 K/uL (ref 4.0–10.5)
nRBC: 0.3 % — ABNORMAL HIGH (ref 0.0–0.2)

## 2024-04-29 LAB — MAGNESIUM: Magnesium: 2.4 mg/dL (ref 1.7–2.4)

## 2024-04-29 LAB — GLUCOSE, CAPILLARY: Glucose-Capillary: 144 mg/dL — ABNORMAL HIGH (ref 70–99)

## 2024-04-29 MED ORDER — HYDROCERIN EX CREA
TOPICAL_CREAM | Freq: Two times a day (BID) | CUTANEOUS | Status: DC
  Filled 2024-04-29: qty 113

## 2024-04-29 MED ORDER — TORSEMIDE 20 MG PO TABS: 60.0000 mg | ORAL_TABLET | Freq: Every day | ORAL | Status: DC

## 2024-04-29 MED ORDER — NICOTINE 21 MG/24HR TD PT24
21.0000 mg | MEDICATED_PATCH | Freq: Every day | TRANSDERMAL | 0 refills | Status: AC
  Filled 2024-04-29: qty 28, 28d supply, fill #0

## 2024-04-29 NOTE — Plan of Care (Signed)

## 2024-04-29 NOTE — Progress Notes (Signed)
 Patient IV removed from LFA, given discharge instructions with follow up appointments , patient refused nicotine  patches from med to bed, awaiting daughter to pick her up at this time.

## 2024-04-29 NOTE — Consult Note (Addendum)
 WOC Nurse Consult Note: Reason for Consult: Requested to assess possible fungal infection below breasts. Wound type: Rash under breasts, no open wound. Possible MASD intertriginous that cause a fungal infection. Pressure Injury POA: NA  I received this consultation about possible yeast under breasts. Since it's an dermatological condition with no open wound, the pt may benefit from a dermatologist consultation for a possible topical corticosteroid and an antifungal, if the doctor judge appropriate. The skin is intact and dry, the fungal powder that we have available will not be effective.  Addition recommendation: Cleanse the skin with warm water and soft wipes or cloths, pat dry. Apply a small amount of Eucerin cream to the area 2 times per day.   WOC team will not plan to follow further. Please reconsult if further assistance is needed. Thank-you,  Lela Holm MSN, RN, CWCN, CNS.  (Phone [phone])

## 2024-04-29 NOTE — Progress Notes (Signed)
 Heart Failure Stewardship Pharmacy Note  PCP: Cleotilde Oneil FALCON, MD PCP-Cardiologist: [name], MD  HPI: Misty Adams is a 69 y.o. female with hypertension, hyperlipidemia, type 2 diabetes, hypothyroidism, CHF, peripheral vascular disease s/p left subclavian stent placement  chronic hypoxic respiratory failure secondary to COPD, pulmonary hypertension, ILD on chronic 2 L who presented with progressive shortness of breath. On admission, BNP was 3355, HS-troponin was 50, and LFTs WNL. Chest x-ray noted perihilar vascular congestion with diffuse interstitial prominence, consistent with pulmonary interstitial edema.   Pertinent cardiac history: Echo in 02/2023 showed LVEF of 60-65%, grade II diastolic dysfunction, mild AS. HS-troponin was 8 on admission. CXR this admission noted interstitial pulmonary edema with small right pleural effusion. After recent admission in November, the patient was taking oral furosemide  and was found to be volume up at her PCP appointment. Torsemide  was started, but patient reports that this did not significantly increase diuresis. Underwent cath on 03/26/23 where a 90% stenosis of mid RCA was identified as the culprit lesion and treated with DES. RHC showed PCWP of 24 and PAP of 49/24 (33), CO/CI of 5.76/3.13, RAP of 14.   Pertinent Lab Values: Creatinine  Date Value Ref Range Status  02/04/2024 2.04 (H) 0.44 - 1.00 mg/dL Final  93/89/7984 9.38 0.60 - 1.30 mg/dL Final   Creatinine, Ser  Date Value Ref Range Status  04/29/2024 2.30 (H) 0.44 - 1.00 mg/dL Final   BUN  Date Value Ref Range Status  04/29/2024 68 (H) 8 - 23 mg/dL Final  87/69/7974 40 (H) 8 - 27 mg/dL Final  93/89/7984 15 7 - 18 mg/dL Final   Potassium  Date Value Ref Range Status  04/29/2024 3.6 3.5 - 5.1 mmol/L Final  09/28/2013 3.9 3.5 - 5.1 mmol/L Final   Sodium  Date Value Ref Range Status  04/29/2024 142 135 - 145 mmol/L Final  04/19/2024 141 134 - 144 mmol/L Final  09/28/2013 133  (L) 136 - 145 mmol/L Final   B Natriuretic Peptide  Date Value Ref Range Status  02/25/2024 651.3 (H) 0.0 - 100.0 pg/mL Final    Comment:    Performed at [facility], [address] [place], [place] [address]   Magnesium   Date Value Ref Range Status  04/29/2024 2.4 1.7 - 2.4 mg/dL Final    Comment:    Performed at [facility], [address] [place], [place] [address]   Hgb A1c MFr Bld  Date Value Ref Range Status  07/15/2022 7.3 (H) 4.8 - 5.6 % Final    Comment:    (NOTE)         Prediabetes: 5.7 - 6.4         Diabetes: >6.4         Glycemic control for adults with diabetes: <7.0    LDH  Date Value Ref Range Status  07/16/2023 103 98 - 192 U/L Final    Comment:    Performed at [facility], [address] [place], [place] [address]    Vital Signs:  Temp:  [97.5 F (36.4 C)-98.2 F (36.8 C)] 98.2 F (36.8 C) (01/09 0900) Pulse Rate:  [60-66] 66 (01/09 0900) Cardiac Rhythm: Sinus bradycardia (01/09 0713) Resp:  [18-20] 18 (01/09 0900) BP: (132-173)/(35-80) 132/35 (01/09 0900) SpO2:  [90 %-97 %] 90 % (01/09 0900) Weight:  [81.4 kg (179 lb 7.3 oz)] 81.4 kg (179 lb 7.3 oz) (01/09 0500)  Intake/Output Summary (Last 24 hours) at 04/29/2024 0919 Last data filed at  04/29/2024 0500 Gross per 24 hour  Intake 1080 ml  Output 3150 ml  Net -2070 ml    Current Heart Failure Medications:  Loop diuretic: bumetanide  2 mg IV q12h Beta-Blocker: bisoprolol  5 mg daily ACEI/ARB/ARNI: none MRA: none SGLT2i: Jardiance  10 mg daily Other: none  Prior to admission Heart Failure Medications:  Loop diuretic: torsemide  60 mg daily + metolazone  2.5 mg prn + Furoscix  prn Beta-Blocker: bisoprolol  5 mg daily ACEI/ARB/ARNI: none MRA: spironolactone  25 mg daily SGLT2i: Jardiance  10 mg daily Other: none  Assessment: 1. Acute on chronic diastolic heart failure (LVEF 60-65%) with grade II diastolic dysfunction. NYHA class III symptoms.  -Symptoms: Symptoms  are much improved. -Volume: Likely euvolemic. Excellent urine output. Consider torsemide  80 mg daily at discharge. -Hemodynamics: BP is elevated. HR 60s.  -SGLT2i: Continue Jardiance  10 mg daily. -MRA: Given elevated creatinine and low eGFR, patient will likely require stopping spironolactone  at discharge. She may be a candidate for Leonore if eGFR improves some. -ARNI: Renal function is too poor to add RAASi. - Patient would benefit from afterload reduction. Though there is no benefit in diastolic CHF, would consider adding amlodipine  or hydralazine  for hypertension.  Plan: 1) Medication changes recommended at this time: - Consider adding amlodipine  10 mg daily for BP control.  2) Patient assistance: -Pending  3) Education: - Patient has been educated on current HF medications and potential additions to HF medication regimen - Patient verbalizes understanding that over the next few months, these medication doses may change and more medications may be added to optimize HF regimen - Patient has been educated on basic disease state pathophysiology and goals of therapy  Please do not hesitate to reach out with questions or concerns,  Britnie Colville, PharmD, CPP, BCPS, [facility] Heart Failure Pharmacist  Phone - [phone] 04/29/2024 9:19 AM

## 2024-04-29 NOTE — Discharge Summary (Signed)
 " Physician Discharge Summary   Patient: Misty Adams MRN: [id number] DOB: 07-12-1955  Admit date:     04/25/2024  Discharge date: 04/29/2024  Discharge Physician: Carliss LELON Canales   PCP: Cleotilde Oneil FALCON, MD   Recommendations at discharge:    Pt to be discharged home.   If you experience worsening fever, chills, chest pain, shortness of breath, or other concerning symptoms, please call your PCP or go to the emergency department immediately.  Discharge Diagnoses: Principal Problem:   Acute on chronic hypoxic respiratory failure (HCC) Active Problems:   Chronic heart failure with preserved ejection fraction (HFpEF) (HCC)   Hypothyroidism   Chronic obstructive pulmonary disease (HCC)   Essential hypertension   Hyperlipidemia, unspecified   Anxiety and depression   GERD without esophagitis   CKD (chronic kidney disease), stage IV (HCC)   Pulmonary hypertension, unspecified (HCC)   Diabetes mellitus with peripheral vascular disease (HCC)   Tobacco use disorder  Resolved Problems:   * No resolved hospital problems. *   Hospital Course:  69 y.o. year old female with medical history of hypertension, hyperlipidemia, type 2 diabetes, hypothyroidism, CHF (EF 60%, G2 DD and 02/2023), chronic hypoxic respiratory failure secondary to COPD, pulmonary hypertension, ILD on chronic 2 L presented to the ED with worsening shortness of breath.  Laboratory workup showed elevated proBNP, chest x-ray concerning for fluid overload admitted to hospitalist service for acute on chronic hypoxic respiratory failure, CHF exacerbation.    Assessment and Plan:   Acute on chronic hypoxic respiratory failure - Initially needing 4+ liters nasal cannula presentation.  Able to be weaned down to 2 L (patient's baseline).  Patient ambulatory, eager for discharge home.  Acute exacerbation of chronic HFpEF - Responded quite well after transition to IV Bumex  2 mg every 12 hours.  Diuresed greater than 5 L the last  couple days.  Will transition patient to p.o. torsemide  60 mg twice daily for the next 3 days, then patient can resume her normal daily regiment of 60 mg daily thereafter.  COPD exacerbation - Showed improvement throughout the course of hospital stay.  Completed corticosteroid therapy.  Resume home nebulizer/inhalers.  Encouraged tobacco cessation.   Tobacco abuse - States she does not want to quit smoking, currently smoking 1-1/2 packs/day.  Continue to encourage cessation.  Prescription for nicotine  patch provided.   AKI on CKD 4 - Mild improvement in creatinine since yesterday but above baseline.  Likely cardiorenal etiology on top of CKD.     Diabetes mellitus - Resume home regimen   Chronic anemia - No active bleeding appreciated.  Hemoglobin stable.  Will recheck CBC in AM.   Consultants: None Procedures performed: None Disposition: Home Diet recommendation:  Cardiac and Carb modified diet  DISCHARGE MEDICATION: Allergies as of 04/29/2024       Reactions   Hydrocodone -acetaminophen  Hives   Oxycodone  Nausea Only   GI upset, felt faint-per pt   Vicodin [hydrocodone -acetaminophen ] Hives   Trelegy Ellipta [fluticasone-umeclidin-vilant] Rash   Changed to Budeson-Glycopyrrol-Formoterol  by provider        Medication List     STOP taking these medications    Furoscix  80 MG/10ML Ctkt Generic drug: Furosemide        TAKE these medications    Accu-Chek Guide Test test strip Generic drug: glucose blood   Accu-Chek Softclix Lancets lancets SMARTSIG:Lancet Topical   albuterol  108 (90 Base) MCG/ACT inhaler Commonly known as: VENTOLIN  HFA Inhale 2 puffs into the lungs every 6 (six) hours as needed  for wheezing or shortness of breath.   ALPRAZolam  0.5 MG tablet Commonly known as: XANAX  Take 0.5 mg by mouth 2 (two) times daily as needed for anxiety.   aspirin  81 MG tablet Take 81 mg by mouth at bedtime.   atorvastatin  40 MG tablet Commonly known as: Lipitor Take  1 tablet (40 mg total) by mouth at bedtime.   bisacodyl  5 MG EC tablet Commonly known as: DULCOLAX Take 5 mg by mouth daily as needed for moderate constipation.   bisoprolol  5 MG tablet Commonly known as: ZEBETA  TAKE 1 TABLET (5 MG TOTAL) BY MOUTH DAILY.   Breztri  Aerosphere 160-9-4.8 MCG/ACT Aero inhaler Generic drug: budesonide -glycopyrrolate -formoterol  Inhale 2 puffs into the lungs 2 (two) times daily.   ezetimibe  10 MG tablet Commonly known as: ZETIA  TAKE 1 TABLET BY MOUTH EVERY DAY   gabapentin  100 MG capsule Commonly known as: NEURONTIN  Take 200 mg by mouth at bedtime.   Jardiance  10 MG Tabs tablet Generic drug: empagliflozin  TAKE ONE TABLET BY MOUTH DAILY   levothyroxine  125 MCG tablet Commonly known as: SYNTHROID  Take 125 mcg by mouth daily.   metolazone  2.5 MG tablet Commonly known as: ZAROXOLYN  Take 1 tab weekly and an additional dose as directed by HF CLINIC   mupirocin  ointment 2 % Commonly known as: BACTROBAN  Apply 1 Application topically 2 (two) times daily.   nicotine  21 mg/24hr patch Commonly known as: NICODERM CQ  - dosed in mg/24 hours Place 1 patch (21 mg total) onto the skin daily.   OXYGEN  Inhale 2 L/L into the lungs at bedtime.   pantoprazole  40 MG tablet Commonly known as: PROTONIX  Take 40 mg by mouth 2 (two) times daily.   potassium chloride  10 MEQ tablet Commonly known as: KLOR-CON  Take 30 mEq by mouth in the morning and at bedtime.   spironolactone  25 MG tablet Commonly known as: ALDACTONE  Take 1 tablet (25 mg total) by mouth daily.   temazepam  15 MG capsule Commonly known as: RESTORIL  Take 15 mg by mouth at bedtime as needed.   torsemide  20 MG tablet Commonly known as: DEMADEX  Take 3 tablets (60 mg total) by mouth daily. What changed: Another medication with the same name was added. Make sure you understand how and when to take each.   torsemide  20 MG tablet Commonly known as: DEMADEX  Take 3 tablets (60 mg total) by mouth  daily. Take 60mg  twice per day x 3 more days, then transition back to previous regimen 60mg  daily. What changed: You were already taking a medication with the same name, and this prescription was added. Make sure you understand how and when to take each.   traZODone  100 MG tablet Commonly known as: DESYREL  Take 100 mg by mouth daily as needed for sleep. What changed: when to take this   venlafaxine  XR 75 MG 24 hr capsule Commonly known as: EFFEXOR -XR Take 75 mg by mouth daily with breakfast.   Vitamin B-12 500 MCG Subl Place under the tongue daily.   Vitamin D3 75 MCG (3000 UT) Tabs Take 3,000 Units by mouth once a week. Tuesday               Discharge Care Instructions  (From admission, onward)           Start     Ordered   04/29/24 0000  Discharge wound care:       Comments: Rash below breasts: Cleanse the skin with warm water and soft wipes or cloths, pat dry. Apply a small amount  of Eucerin cream to the area 2 times per day.   04/29/24 1015            Follow-up Information     [facility]. Go on 05/12/2024.   Specialty: Cardiology Why: Hospital follow-up 05/12/24 @ 8:30 AM Please bring all medications to follow-up appointment [facility], [address] Free Valet Parking at the door Contact information: [address] [place]  [address] [phone]                Discharge Exam: Fredricka Weights   04/27/24 0536 04/28/24 0500 04/29/24 0500  Weight: 84.1 kg 82.6 kg 81.4 kg    GENERAL:  Alert, pleasant, no acute distress  HEENT:  EOMI, nasal cannula CARDIOVASCULAR:  RRR, no murmurs appreciated RESPIRATORY: Poor air movement bilaterally GASTROINTESTINAL:  Soft, nontender, nondistended EXTREMITIES:  No LE edema bilaterally NEURO:  No new focal deficits appreciated SKIN:  No rashes noted PSYCH:  Appropriate mood and affect    Condition at discharge:  improving  The results of significant diagnostics from this hospitalization (including imaging, microbiology, ancillary and laboratory) are listed below for reference.   Imaging Studies: DG Chest 2 View Result Date: 04/25/2024 EXAM: 2 VIEW(S) XRAY OF THE CHEST 04/25/2024 01:39:41 PM COMPARISON: 03/10/2024 CLINICAL HISTORY: SOB FINDINGS: LINES, TUBES AND DEVICES: A left subclavian stent is noted. LUNGS AND PLEURA: Mild pulmonary edema is present. Stable bilateral interstitial prominence is noted. No pleural effusion. No pneumothorax. HEART AND MEDIASTINUM: Stable cardiomediastinal silhouette with mild cardiomegaly. Aortic arch atherosclerosis is present. BONES AND SOFT TISSUES: Thoracic spondylosis is present. IMPRESSION: 1. Perihilar vascular congestion with diffuse interstitial prominence, consistent with pulmonary interstitial edema. 2. Stable cardiomediastinal silhouette with mild cardiomegaly and aortic arch atherosclerosis. Electronically signed by: Morene Hoard MD 04/25/2024 02:16 PM EST RP Workstation: [id number]    Microbiology: Results for orders placed or performed during the hospital encounter of 04/25/24  Resp panel by RT-PCR (RSV, Flu A&B, Covid) Anterior Nasal Swab     Status: None   Collection Time: 04/25/24  5:10 PM   Specimen: Anterior Nasal Swab  Result Value Ref Range Status   SARS Coronavirus 2 by RT PCR NEGATIVE NEGATIVE Final    Comment: (NOTE) SARS-CoV-2 target nucleic acids are NOT DETECTED.  The SARS-CoV-2 RNA is generally detectable in upper respiratory specimens during the acute phase of infection. The lowest concentration of SARS-CoV-2 viral copies this assay can detect is 138 copies/mL. A negative result does not preclude SARS-Cov-2 infection and should not be used as the sole basis for treatment or other patient management decisions. A negative result may occur with  improper specimen collection/handling, submission of specimen other than nasopharyngeal  swab, presence of viral mutation(s) within the areas targeted by this assay, and inadequate number of viral copies(<138 copies/mL). A negative result must be combined with clinical observations, patient history, and epidemiological information. The expected result is Negative.  Fact Sheet for Patients:  bloggercourse.com  Fact Sheet for Healthcare Providers:  seriousbroker.it  This test is no t yet approved or cleared by the [place]  FDA and  has been authorized for detection and/or diagnosis of SARS-CoV-2 by FDA under an Emergency Use Authorization (EUA). This EUA will remain  in effect (meaning this test can be used) for the duration of the COVID-19 declaration under Section 564(b)(1) of the Act, 21 U.S.C.section 360bbb-3(b)(1), unless the authorization is terminated  or revoked sooner.  Influenza A by PCR NEGATIVE NEGATIVE Final   Influenza B by PCR NEGATIVE NEGATIVE Final    Comment: (NOTE) The Xpert Xpress SARS-CoV-2/FLU/RSV plus assay is intended as an aid in the diagnosis of influenza from Nasopharyngeal swab specimens and should not be used as a sole basis for treatment. Nasal washings and aspirates are unacceptable for Xpert Xpress SARS-CoV-2/FLU/RSV testing.  Fact Sheet for Patients: bloggercourse.com  Fact Sheet for Healthcare Providers: seriousbroker.it  This test is not yet approved or cleared by the [place]  FDA and has been authorized for detection and/or diagnosis of SARS-CoV-2 by FDA under an Emergency Use Authorization (EUA). This EUA will remain in effect (meaning this test can be used) for the duration of the COVID-19 declaration under Section 564(b)(1) of the Act, 21 U.S.C. section 360bbb-3(b)(1), unless the authorization is terminated or revoked.     Resp Syncytial Virus by PCR NEGATIVE NEGATIVE Final    Comment: (NOTE) Fact Sheet for  Patients: bloggercourse.com  Fact Sheet for Healthcare Providers: seriousbroker.it  This test is not yet approved or cleared by the [place]  FDA and has been authorized for detection and/or diagnosis of SARS-CoV-2 by FDA under an Emergency Use Authorization (EUA). This EUA will remain in effect (meaning this test can be used) for the duration of the COVID-19 declaration under Section 564(b)(1) of the Act, 21 U.S.C. section 360bbb-3(b)(1), unless the authorization is terminated or revoked.  Performed at [facility], [address] [place], [place] [address]     Labs: CBC: Recent Labs  Lab 04/25/24 1438 04/26/24 0325 04/27/24 0741 04/28/24 0425 04/29/24 0315  WBC 9.3 7.8 11.8* 9.8 7.7  HGB 8.8* 8.0* 7.7* 7.7* 8.1*  HCT 31.2* 28.6* 26.8* 26.9* 28.4*  MCV 89.4 89.4 87.0 88.2 88.2  PLT 261 267 283 282 276   Basic Metabolic Panel: Recent Labs  Lab 04/25/24 1438 04/25/24 1813 04/26/24 0325 04/27/24 0741 04/28/24 0425 04/29/24 0315  NA 141  --  138 137 141 142  K 4.6  --  4.2 4.5 3.7 3.6  CL 103  --  102 101 103 103  CO2 27  --  24 27 28 29   GLUCOSE 122*  --  205* 119* 94 158*  BUN 55*  --  62* 71* 72* 68*  CREATININE 2.51*  --  2.69* 2.69* 2.36* 2.30*  CALCIUM  9.6  --  8.9 9.3 8.9 8.9  MG  --  2.3  --  2.6* 2.5* 2.4   Liver Function Tests: Recent Labs  Lab 04/27/24 0741  AST 15  ALT 12  ALKPHOS 56  BILITOT 0.3  PROT 6.0*  ALBUMIN  3.4*   CBG: Recent Labs  Lab 04/28/24 0841 04/28/24 1246 04/28/24 1636 04/28/24 2202 04/29/24 0944  GLUCAP 97 107* 148* 90 144*    Discharge time spent: 25 minutes.  Length of inpatient stay: 4 days  Signed: Carliss LELON Canales, DO [facility] 04/29/2024         "

## 2024-05-02 ENCOUNTER — Inpatient Hospital Stay: Payer: Self-pay | Attending: Internal Medicine

## 2024-05-02 VITALS — BP 165/53 | HR 58 | Temp 99.0°F | Resp 18

## 2024-05-02 DIAGNOSIS — D649 Anemia, unspecified: Secondary | ICD-10-CM

## 2024-05-02 MED ORDER — IRON SUCROSE 20 MG/ML IV SOLN
200.0000 mg | Freq: Once | INTRAVENOUS | Status: AC
  Administered 2024-05-02: 200 mg via INTRAVENOUS
  Filled 2024-05-02: qty 10

## 2024-05-02 MED ORDER — SODIUM CHLORIDE 0.9% FLUSH
10.0000 mL | Freq: Once | INTRAVENOUS | Status: AC | PRN
  Administered 2024-05-02: 10 mL
  Filled 2024-05-02: qty 10

## 2024-05-02 NOTE — Progress Notes (Signed)
 Patient tolerated Venofer  infusion well. Explained recommendation of 30 min post monitoring. Patient refused to wait post monitoring. Educated on what signs to watch for & to call with any concerns. No questions, discharged. Stable

## 2024-05-02 NOTE — Patient Instructions (Signed)

## 2024-05-05 ENCOUNTER — Ambulatory Visit: Admitting: Family

## 2024-05-07 ENCOUNTER — Other Ambulatory Visit: Payer: Self-pay | Admitting: Family

## 2024-05-11 ENCOUNTER — Telehealth: Payer: Self-pay | Admitting: Family

## 2024-05-11 NOTE — Telephone Encounter (Signed)
 Called to confirm/remind patient of their appointment at [facility] on 05/12/24.   Appointment:   [x] Confirmed  [] Left mess   [] No answer/No voice mail  [] VM Full/unable to leave message  [] Phone not in service  Patient reminded to bring all medications and/or complete list.  Confirmed patient has transportation. Gave directions, instructed to utilize valet parking.

## 2024-05-11 NOTE — Progress Notes (Unsigned)
 "   Advanced Heart Failure Clinic Note    PCP: Cleotilde Oneil FALCON, MD  Cardiologist: [name], MD/ Loistine Sober, NP   Chief Complaint: shortness of breath   HPI:  [name] is a 69 y.o. female with a history of COPD, HTN, hyperlipidemia, mild pHTN, GERD, subclavian artery stenosis (stented 07/2017), CKD, pulmonary HTN, CAD, iron  deficiency anemia, hypothyroidism, PVD, tobacco use and chronic heart failure.   Admitted 07/14/22 with E. coli UTI, severe sepsis, and AKI. High-sensitivity troponin rose to 56. She did not undergo cardiology evaluation. In August 2024 CT chest performed secondary to weight loss showed aortic atherosclerosis, unchanged 4 mm right apical pulmonary nodule, enlarged pulmonary trunk consistent with PAH, left adrenal adenoma.   Admitted 03/08/23 due to waking up short of breath & orthopnea after returning from a cruise. BNP 710. CXR showed heart failure. Initially placed on bipap and weaned down to nasal cannula. IV lasix  given. Hypokalemia corrected. Echocardiogram performed on 03/08/2023 showed ejection fraction 60 to 65% with grade 2 diastolic dysfunction. Symptoms improved.   Admitted 03/23/23 due to worsening of shortness of breath, leg swelling and significant weight gain. Developed hypoxia and placed on 5L nasal cannula. BNP elevated and given IV lasix . Cardiology consulted. Chest x-ray showed evidence of pulmonary edema with pleural effusion. CT shows signs consistent with pulmonary hypertension. Oxygen  weaned down to 2L. L and R heart cath 12/5 with stent placed to RCA. Hypokalemia corrected.   RHC/ LHC 03/26/23:   Prox RCA lesion is 45% stenosed.   CULPRIT LESION: Mid RCA lesion is 90% stenosed.   A drug-eluting stent was successfully placed using a STENT ONYX FRONTIER 3.0X22.   Post intervention, there is a 0% residual stenosis.   -------------------------------------   Hemodynamic findings consistent with mild pulmonary hypertension.  (PAP 49/24-33  mmHg, PCWP 24 mmHg transpulmonary Gradient 9 mmHg); LVEDP 16 mmHg.   Compensated CHF: LVEDP 16 mmHg, PCWP 24 mmHg.   Recommend uninterrupted dual antiplatelet therapy with Aspirin  81mg  daily and Clopidogrel  75mg  daily for a minimum of 12 months (ACS-Class I recommendation).  Admitted 07/06/23 with lightheadedness and multiple presyncopal episodes in preceding 24 hours leading to a fall on her lower back. Hypotensive in ED 95/45. RBC transfusion given for hemoglobin of 6.6. Was also given IV iron  infusion. No injuries noted on imaging in the ED. Given IVF for hypotension. GI consulted and will have outpatient follow-up.   Seen in [facility] 04/25 and spironolactone  12.5mg  daily was started.   Admitted 10/19/23 with progressive lower extremity swelling, weight gain, dyspnea, orthopnea, cough. Had doubled her torsemide  at home without any improvement. On admission, BNP was 851.2, HS-troponin was 8, and iron  studies pending. Chest x-ray noted central vascular congestion with small left effusion. IV diuresed.   Had called [facility] 10/25/23 with weight gain and had diuretic increased.   Seen in [facility] 02/25/24 being volume up after going to the beach and not having metolazone  for 2 weeks due to pharmacy telling her it couldn't be filled. Was given metolazone  2.5mg  daily X 2 days & then to resume weekly dose.   Admitted 03/10/24 with fatigue and dark stools. Was seen in [facility] 03/10/24 and after labs resulted Hg of 6.4, she was sent to the ER. Given 2 units PRBC's with IV lasix  after each unit. Cardiology ok'd stoppage of plavix  as she was only 2 weeks shy of 1 year since DES placed.   Endoscopy / colonoscopy done 03/28/24 with removal of polyps.   Admitted 04/25/24 with  worsening shortness of breath. Laboratory workup showed elevated proBNP, chest x-ray concerning for fluid overload. Oxygen  weaned from 4L => 2L. IV diuresed >5L.   She presents for a HF follow-up visit, with her daughter, with a chief complaint of shortness of  breath. Has associated pedal edema, abdominal distention (not bad), fatigue, cough. Denies chest pain, palpitations, dizziness. Wearing oxygen  at 2L around the clock. Saw PCP 3 days ago and was started on hydralazine  25mg  daily. She says her home BP's have been running 120's / 40's.   Continues to smoke 1.5-2 ppd and is trying to smoke less. She left her cigarettes at home today. Does take her oxygen  off when smoking and removes herself from close proximity of it.   Has metolazone  that she takes twice weekly on Tues / Fri but she took an additional dose yesterday due to feeling like she was retaining fluid.   ROS: All systems negative except as listed in HPI, PMH and Problem List.  SH:  Social History   Socioeconomic History   Marital status: Divorced    Spouse name: Not on file   Number of children: 5   Years of education: Not on file   Highest education level: Associate degree: academic program  Occupational History   Occupation: Retired  Tobacco Use   Smoking status: Every Day    Current packs/day: 1.00    Average packs/day: 1 pack/day for 60.0 years (60.0 ttl pk-yrs)    Types: Cigarettes   Smokeless tobacco: Never  Vaping Use   Vaping status: Former   Substances: Nicotine   Substance and Sexual Activity   Alcohol use: Not Currently   Drug use: Not Currently   Sexual activity: Not Currently  Other Topics Concern   Not on file  Social History Narrative   Lives w/ 3 of her children   Social Drivers of Health   Tobacco Use: High Risk (04/19/2024)   Patient History    Smoking Tobacco Use: Every Day    Smokeless Tobacco Use: Never    Passive Exposure: Not on file  Financial Resource Strain: High Risk (05/07/2023)   Received from [facility]   Overall Financial Resource Strain (CARDIA)    Difficulty of Paying Living Expenses: Hard  Food Insecurity: No Food Insecurity (04/26/2024)   Epic    Worried About Radiation Protection Practitioner of Food in the Last Year: Never true     Ran Out of Food in the Last Year: Never true  Transportation Needs: No Transportation Needs (04/26/2024)   Epic    Lack of Transportation (Medical): No    Lack of Transportation (Non-Medical): No  Physical Activity: Not on file  Stress: Not on file  Social Connections: Socially Isolated (04/26/2024)   Social Connection and Isolation Panel    Frequency of Communication with Friends and Family: More than three times a week    Frequency of Social Gatherings with Friends and Family: Once a week    Attends Religious Services: Never    Database Administrator or Organizations: No    Attends Banker Meetings: Never    Marital Status: Divorced  Catering Manager Violence: Not At Risk (04/26/2024)   Epic    Fear of Current or Ex-Partner: No    Emotionally Abused: No    Physically Abused: No    Sexually Abused: No  Depression (PHQ2-9): Low Risk (04/18/2024)   Depression (PHQ2-9)    PHQ-2 Score: 0  Alcohol Screen: Low Risk (03/24/2023)   Alcohol Screen  Last Alcohol Screening Score (AUDIT): 1  Housing: Unknown (05/09/2024)   Received from [facility]   Epic    In the last 12 months, was there a time when you were not able to pay the mortgage or rent on time?: Patient declined    In the past 12 months, how many times have you moved where you were living?: 0    At any time in the past 12 months, were you homeless or living in a shelter (including now)?: No  Utilities: Not At Risk (04/26/2024)   Epic    Threatened with loss of utilities: No  Health Literacy: Not on file    FH:  Family History  Problem Relation Age of Onset   Uterine cancer Mother    Stroke Mother    Aneurysm Mother    COPD Mother    Heart attack Father    Coronary artery disease Father    Leukemia Father    Depression Father    Diabetes Father    Hyperlipidemia Father    Hypertension Father    Alcohol abuse Father    Cancer Father    Heart disease Father    Colon polyps Sister     Obesity Sister    Thyroid  disease Sister    COPD Sister    Lung cancer Sister    Breast cancer Maternal Aunt    Breast cancer Maternal Aunt    Kidney disease Paternal Uncle     Past Medical History:  Diagnosis Date   Allergy    Anemia 11/23   Anxiety    Blood transfusion without reported diagnosis    CHF (congestive heart failure) (HCC)    Chronic heart failure with preserved ejection fraction (HFpEF) (HCC)    a. 02/2023 Echo: EF 60-65%, no rwma, GrII DD, nl RV size/fxn, mild LAE, mild MS (mean grad ), mod MAC, mild AS (mean grad 9.82mmHg).   CKD (chronic kidney disease), stage III (HCC)    COPD (chronic obstructive pulmonary disease) (HCC)    Coronary artery disease    Depression    Diabetes mellitus without complication (HCC)    Emphysema of lung (HCC) 2010   Hyperlipidemia    Hypertension    Hypothyroidism    Interstitial lung disease (HCC)    PAD (peripheral artery disease)    a. 07/2017 L subclavian stenosis s/p stenting.   Psoriasis    Squamous cell carcinoma of skin 08/25/2023   SCC IS,  [facility], referral sent for Mohs Dr. Corey   Substance abuse [facility])    Tobacco abuse     Current Outpatient Medications  Medication Sig Dispense Refill   ACCU-CHEK GUIDE TEST test strip      Accu-Chek Softclix Lancets lancets SMARTSIG:Lancet Topical     albuterol  (VENTOLIN  HFA) 108 (90 Base) MCG/ACT inhaler Inhale 2 puffs into the lungs every 6 (six) hours as needed for wheezing or shortness of breath. 1 each 3   ALPRAZolam  (XANAX ) 0.5 MG tablet Take 0.5 mg by mouth 2 (two) times daily as needed for anxiety.      aspirin  81 MG tablet Take 81 mg by mouth at bedtime.      atorvastatin  (LIPITOR) 40 MG tablet Take 1 tablet (40 mg total) by mouth at bedtime. 30 tablet 0   bisacodyl  (DULCOLAX) 5 MG EC tablet Take 5 mg by mouth daily as needed for moderate constipation.     bisoprolol  (ZEBETA ) 5 MG tablet TAKE 1 TABLET (5 MG TOTAL) BY  MOUTH DAILY. 90 tablet 1    budeson-glycopyrrolate -formoterol  (BREZTRI  AEROSPHERE) 160-9-4.8 MCG/ACT AERO inhaler Inhale 2 puffs into the lungs 2 (two) times daily.     Cholecalciferol  (VITAMIN D3) 75 MCG (3000 UT) TABS Take 3,000 Units by mouth once a week. Tuesday     Cyanocobalamin  (VITAMIN B-12) 500 MCG SUBL Place under the tongue daily.     ezetimibe  (ZETIA ) 10 MG tablet TAKE 1 TABLET BY MOUTH EVERY DAY 90 tablet 1   gabapentin  (NEURONTIN ) 100 MG capsule Take 200 mg by mouth at bedtime.     JARDIANCE  10 MG TABS tablet TAKE ONE TABLET BY MOUTH DAILY 90 tablet 2   levothyroxine  (SYNTHROID , LEVOTHROID) 125 MCG tablet Take 125 mcg by mouth daily.      metolazone  (ZAROXOLYN ) 2.5 MG tablet Take 1 tab weekly and an additional dose as directed by HF CLINIC 7 tablet 3   mupirocin  ointment (BACTROBAN ) 2 % Apply 1 Application topically 2 (two) times daily. 22 g 0   nicotine  (NICODERM CQ  - DOSED IN MG/24 HOURS) 21 mg/24hr patch Place 1 patch (21 mg total) onto the skin daily. 28 patch 0   OXYGEN  Inhale 2 L/L into the lungs at bedtime.     pantoprazole  (PROTONIX ) 40 MG tablet Take 40 mg by mouth 2 (two) times daily.     potassium chloride  (KLOR-CON ) 10 MEQ tablet Take 30 mEq by mouth in the morning and at bedtime.     spironolactone  (ALDACTONE ) 25 MG tablet Take 1 tablet (25 mg total) by mouth daily. 30 tablet 0   temazepam  (RESTORIL ) 15 MG capsule Take 15 mg by mouth at bedtime as needed.     torsemide  (DEMADEX ) 20 MG tablet Take 3 tablets (60 mg total) by mouth daily. 270 tablet 3   torsemide  (DEMADEX ) 20 MG tablet Take 3 tablets (60 mg total) by mouth daily. Take 60mg  twice per day x 3 more days, then transition back to previous regimen 60mg  daily.     traZODone  (DESYREL ) 100 MG tablet Take 100 mg by mouth daily as needed for sleep. (Patient taking differently: Take 100 mg by mouth at bedtime.)     venlafaxine  XR (EFFEXOR -XR) 75 MG 24 hr capsule Take 75 mg by mouth daily with breakfast.     No current facility-administered  medications for this visit.   Vitals:   05/12/24 0853  BP: (!) 152/79  Pulse: 68  SpO2: 95%  Weight: 176 lb 12.8 oz (80.2 kg)  PF: (!) 2 L/min   Wt Readings from Last 3 Encounters:  05/12/24 176 lb 12.8 oz (80.2 kg)  04/29/24 179 lb 7.3 oz (81.4 kg)  04/19/24 179 lb 12.8 oz (81.6 kg)   Lab Results  Component Value Date   CREATININE 2.30 (H) 04/29/2024   CREATININE 2.36 (H) 04/28/2024   CREATININE 2.69 (H) 04/27/2024    PHYSICAL EXAM:  General: Well appearing.  Cor: No JVD. Regular rhythm, rate.  Lungs: rhonchi in LLL Abdomen: soft, nontender, nondistended. Extremities: 1+ pitting edema bilateral lower legs Neuro:. Affect pleasant   ECG: not done    ASSESSMENT & PLAN:  1: Ischemic heart failure with preserved ejection fraction- - cath with stent 03/26/23 - NYHA class III - appears euvolemic although symptomatically, she feels more SOB today. Large component of COPD.  - weight down 3 pounds from last visit here 3 weeks ago - attempted Reds but received low quality  - Echo 03/09/23: EF 60-65% with Grade II DD, mild LAE, mild AS - continue  bisoprolol  5mg  daily - continue jardiance  10mg  daily  - continue metolazone  2.5mg  twice weekly on Tues / Fri with additional dose if needed - continue potassium 30meq BID - continue spironolactone  25mg  daily - continue torsemide  60mg  AM / 40mg  PM - not adding salt and has been looking at food labels - proBNP 04/25/24 was 3335.0. Recheck this today  2: HTN- - BP 152/79. Home BP's running 120's / 40's - saw PCP Ted) 01/26 and hydralazine  25mg  daily was started. Titrate up if able - saw nephrology Jil) 07/25. Returns 02/26 - BMP 04/29/24 reviewed: sodium 142, potassium 3.6, creatinine 2.3, GFR 22 - BMET today  3: CAD- - saw cardiology ([place]) 08/25 - no longer on plavix  - continue ASA 81mg  daily - RHC/ LHC 03/26/23:   Prox RCA lesion is 45% stenosed.   CULPRIT LESION: Mid RCA lesion is 90% stenosed.   A  drug-eluting stent was successfully placed using a STENT ONYX FRONTIER 3.0X22.   Post intervention, there is a 0% residual stenosis.   Hemodynamic findings consistent with mild pulmonary hypertension.  (PAP 49/24-33 mmHg, PCWP 24 mmHg transpulmonary Gradient 9 mmHg); LVEDP 16 mmHg.  4: COPD (managed by pulmonology)- - continue albuterol  inhaler PRN; she estimates that she uses this ~ once / month - continue breztri   - saw pulmonology Alica) 04/25 - wearing oxygen  at 2L around the clock  5: Anemia (managed by hematology)- - saw hematology Oza) 12/25 - iron  infusion done 04/18/24, 05/02/24 - Hg 04/29/24 was 8.1. Was down to 6.4 in 11/25 resulting in admission - saw GI 12/25. Colonoscopy / small intestine endoscopy done 03/28/24  6: Hyperlipidemia- - continue atorvastatin  40mg  daily - continue ezetimibe  10mg  daily - LDL 01/19/24 was 40 - lipo (a) 03/27/23 was 54.2  7: Tobacco use- - smoking 1.5 ppd of cigarettes although she says that she's trying to smoke less. Left her cigarettes at home today.  - She says that she doesn't go anywhere except appointments so she sits home, plays on the computer and drinks diet Mtn Dew and smokes - voices confirmation that she removes her oxygen  when smoking and removes herself away from the oxygen  - cessation encouraged   Return in 2 months, sooner if needed.   I spent 35 minutes reviewing records, interviewing/ examing patient and managing plan/ orders.   Ellouise Class, FNP-C 05/11/24  "

## 2024-05-12 ENCOUNTER — Ambulatory Visit: Payer: Self-pay | Admitting: Family

## 2024-05-12 ENCOUNTER — Encounter: Payer: Self-pay | Admitting: Family

## 2024-05-12 ENCOUNTER — Other Ambulatory Visit
Admission: RE | Admit: 2024-05-12 | Discharge: 2024-05-12 | Disposition: A | Discharge status: Home / Self Care | Source: Ambulatory Visit | Attending: Family | Admitting: Family

## 2024-05-12 ENCOUNTER — Ambulatory Visit: Admitting: Family

## 2024-05-12 VITALS — BP 152/79 | HR 68 | Wt 176.8 lb

## 2024-05-12 DIAGNOSIS — I5032 Chronic diastolic (congestive) heart failure: Secondary | ICD-10-CM | POA: Insufficient documentation

## 2024-05-12 DIAGNOSIS — J449 Chronic obstructive pulmonary disease, unspecified: Secondary | ICD-10-CM

## 2024-05-12 DIAGNOSIS — I2510 Atherosclerotic heart disease of native coronary artery without angina pectoris: Secondary | ICD-10-CM

## 2024-05-12 DIAGNOSIS — D649 Anemia, unspecified: Secondary | ICD-10-CM | POA: Diagnosis not present

## 2024-05-12 DIAGNOSIS — Z720 Tobacco use: Secondary | ICD-10-CM

## 2024-05-12 DIAGNOSIS — I10 Essential (primary) hypertension: Secondary | ICD-10-CM

## 2024-05-12 DIAGNOSIS — E785 Hyperlipidemia, unspecified: Secondary | ICD-10-CM | POA: Diagnosis not present

## 2024-05-12 LAB — BASIC METABOLIC PANEL WITH GFR
Anion gap: 14 (ref 5–15)
BUN: 68 mg/dL — ABNORMAL HIGH (ref 8–23)
CO2: 29 mmol/L (ref 22–32)
Calcium: 9.9 mg/dL (ref 8.9–10.3)
Chloride: 97 mmol/L — ABNORMAL LOW (ref 98–111)
Creatinine, Ser: 2.62 mg/dL — ABNORMAL HIGH (ref 0.44–1.00)
GFR, Estimated: 19 mL/min — ABNORMAL LOW
Glucose, Bld: 137 mg/dL — ABNORMAL HIGH (ref 70–99)
Potassium: 4.1 mmol/L (ref 3.5–5.1)
Sodium: 139 mmol/L (ref 135–145)

## 2024-05-12 LAB — PRO BRAIN NATRIURETIC PEPTIDE: Pro Brain Natriuretic Peptide: 1692 pg/mL — ABNORMAL HIGH

## 2024-05-12 MED ORDER — METOLAZONE 2.5 MG PO TABS: ORAL_TABLET | ORAL | 3 refills | Status: AC

## 2024-05-12 NOTE — Patient Instructions (Signed)
 Medication Changes:  INCREASE Metolazone  to 2.5mg  twice weekly with an additional dose as needed.  Lab Work:  Go over to the [facility]. Go pass the gift shop and have your blood work completed.   We will only call you if the results are abnormal or if the provider would like to make medication changes.  No news is good news.   Follow-Up in: Please follow up with the Advanced Heart Failure Clinic in 2 months with Ellouise Class, FNP.   Thank you for choosing [facility].    At the Advanced Heart Failure Clinic, you and your health needs are our priority. We have a designated team specialized in the treatment of Heart Failure. This Care Team includes your primary Heart Failure Specialized Cardiologist (physician), Advanced Practice Providers (APPs- Physician Assistants and Nurse Practitioners), and Pharmacist who all work together to provide you with the care you need, when you need it.   You may see any of the following providers on your designated Care Team at your next follow up:  Dr. Toribio Fuel Dr. Ezra Shuck Dr. Ria Commander Dr. Morene Brownie Ellouise Class, FNP Jaun Bash, RPH-CPP  Please be sure to bring in all your medications bottles to every appointment.   Need to Contact Us :  If you have any questions or concerns before your next appointment please send us  a message through [place] or call our office at [phone].    TO LEAVE A MESSAGE FOR THE NURSE SELECT OPTION 2, PLEASE LEAVE A MESSAGE INCLUDING: YOUR NAME DATE OF BIRTH CALL BACK NUMBER REASON FOR CALL**this is important as we prioritize the call backs  YOU WILL RECEIVE A CALL BACK THE SAME DAY AS LONG AS YOU CALL BEFORE 4:00 PM

## 2024-07-04 ENCOUNTER — Inpatient Hospital Stay: Admitting: Internal Medicine

## 2024-07-04 ENCOUNTER — Inpatient Hospital Stay

## 2024-07-07 ENCOUNTER — Ambulatory Visit: Admitting: Family
